# Patient Record
Sex: Female | Born: 1955 | Race: Black or African American | Hispanic: No | Marital: Single | Smoking: Current every day smoker
Health system: Southern US, Community
[De-identification: ages and names within clinical notes are randomized; demographics above are authoritative.]

## PROBLEM LIST (undated history)

## (undated) DIAGNOSIS — I5042 Chronic combined systolic (congestive) and diastolic (congestive) heart failure: Secondary | ICD-10-CM

## (undated) DIAGNOSIS — I639 Cerebral infarction, unspecified: Secondary | ICD-10-CM

## (undated) DIAGNOSIS — I10 Essential (primary) hypertension: Secondary | ICD-10-CM

## (undated) DIAGNOSIS — K219 Gastro-esophageal reflux disease without esophagitis: Secondary | ICD-10-CM

## (undated) DIAGNOSIS — E119 Type 2 diabetes mellitus without complications: Secondary | ICD-10-CM

## (undated) DIAGNOSIS — J449 Chronic obstructive pulmonary disease, unspecified: Secondary | ICD-10-CM

## (undated) DIAGNOSIS — I2510 Atherosclerotic heart disease of native coronary artery without angina pectoris: Secondary | ICD-10-CM

## (undated) DIAGNOSIS — I739 Peripheral vascular disease, unspecified: Secondary | ICD-10-CM

## (undated) HISTORY — PX: EYE SURGERY: SHX253

## (undated) HISTORY — PX: ABDOMINAL HYSTERECTOMY: SHX81

## (undated) HISTORY — PX: COLONOSCOPY: SHX174

## (undated) HISTORY — PX: CARDIAC CATHETERIZATION: SHX172

---

## 2004-03-22 ENCOUNTER — Emergency Department: Payer: Self-pay | Admitting: Emergency Medicine

## 2004-08-30 ENCOUNTER — Emergency Department: Payer: Self-pay | Admitting: Emergency Medicine

## 2006-10-15 ENCOUNTER — Emergency Department: Payer: Self-pay | Admitting: Emergency Medicine

## 2008-01-10 ENCOUNTER — Emergency Department: Payer: Self-pay | Admitting: Emergency Medicine

## 2009-04-11 ENCOUNTER — Ambulatory Visit: Payer: Self-pay | Admitting: Family Medicine

## 2009-06-13 ENCOUNTER — Inpatient Hospital Stay: Payer: Self-pay | Admitting: Internal Medicine

## 2009-10-23 ENCOUNTER — Encounter: Payer: Self-pay | Admitting: *Deleted

## 2009-11-02 ENCOUNTER — Encounter: Payer: Self-pay | Admitting: *Deleted

## 2009-12-03 ENCOUNTER — Encounter: Payer: Self-pay | Admitting: *Deleted

## 2010-01-03 ENCOUNTER — Encounter: Payer: Self-pay | Admitting: *Deleted

## 2010-02-02 ENCOUNTER — Encounter: Payer: Self-pay | Admitting: *Deleted

## 2010-03-05 ENCOUNTER — Encounter: Payer: Self-pay | Admitting: *Deleted

## 2010-10-22 ENCOUNTER — Emergency Department: Payer: Self-pay | Admitting: Unknown Physician Specialty

## 2011-01-25 ENCOUNTER — Emergency Department: Payer: Self-pay | Admitting: Internal Medicine

## 2011-01-29 ENCOUNTER — Ambulatory Visit: Payer: Self-pay | Admitting: General Practice

## 2011-01-31 ENCOUNTER — Emergency Department: Payer: Self-pay | Admitting: *Deleted

## 2011-04-01 ENCOUNTER — Ambulatory Visit: Payer: Self-pay | Admitting: Unknown Physician Specialty

## 2011-04-01 DIAGNOSIS — I10 Essential (primary) hypertension: Secondary | ICD-10-CM

## 2011-04-08 ENCOUNTER — Ambulatory Visit: Payer: Self-pay | Admitting: Unknown Physician Specialty

## 2011-04-09 LAB — PATHOLOGY REPORT

## 2011-04-12 ENCOUNTER — Emergency Department: Payer: Self-pay | Admitting: Emergency Medicine

## 2011-07-25 ENCOUNTER — Emergency Department: Payer: Self-pay | Admitting: Emergency Medicine

## 2011-07-25 LAB — CBC
HCT: 42.7 % (ref 35.0–47.0)
HGB: 14.2 g/dL (ref 12.0–16.0)
MCV: 84 fL (ref 80–100)
RBC: 5.12 10*6/uL (ref 3.80–5.20)
RDW: 15.1 % — ABNORMAL HIGH (ref 11.5–14.5)

## 2011-07-25 LAB — COMPREHENSIVE METABOLIC PANEL
Albumin: 3.3 g/dL — ABNORMAL LOW (ref 3.4–5.0)
Alkaline Phosphatase: 58 U/L (ref 50–136)
Anion Gap: 7 (ref 7–16)
Bilirubin,Total: 0.3 mg/dL (ref 0.2–1.0)
Co2: 32 mmol/L (ref 21–32)
Creatinine: 1.05 mg/dL (ref 0.60–1.30)
Osmolality: 288 (ref 275–301)

## 2011-08-23 ENCOUNTER — Inpatient Hospital Stay: Payer: Self-pay | Admitting: Internal Medicine

## 2011-08-23 LAB — CBC WITH DIFFERENTIAL/PLATELET
Basophil #: 0 10*3/uL (ref 0.0–0.1)
Basophil %: 0.2 %
Eosinophil #: 0 10*3/uL (ref 0.0–0.7)
Eosinophil %: 0.1 %
HCT: 45.1 % (ref 35.0–47.0)
HGB: 14.7 g/dL (ref 12.0–16.0)
Lymphocyte %: 23.8 %
MCHC: 32.6 g/dL (ref 32.0–36.0)
Monocyte %: 6.5 %
Neutrophil #: 6.4 10*3/uL (ref 1.4–6.5)
RDW: 15.6 % — ABNORMAL HIGH (ref 11.5–14.5)
WBC: 9.2 10*3/uL (ref 3.6–11.0)

## 2011-08-23 LAB — COMPREHENSIVE METABOLIC PANEL
Albumin: 2.9 g/dL — ABNORMAL LOW (ref 3.4–5.0)
Alkaline Phosphatase: 77 U/L (ref 50–136)
BUN: 19 mg/dL — ABNORMAL HIGH (ref 7–18)
Bilirubin,Total: 0.3 mg/dL (ref 0.2–1.0)
Calcium, Total: 8.5 mg/dL (ref 8.5–10.1)
Chloride: 102 mmol/L (ref 98–107)
Co2: 32 mmol/L (ref 21–32)
EGFR (African American): >60
EGFR (Non-African Amer.): >60
Glucose: 317 mg/dL — ABNORMAL HIGH (ref 65–99)
Osmolality: 298 (ref 275–301)
Potassium: 3.4 mmol/L — ABNORMAL LOW (ref 3.5–5.1)
SGOT(AST): 39 U/L — ABNORMAL HIGH (ref 15–37)
SGPT (ALT): 26 U/L
Sodium: 142 mmol/L (ref 136–145)
Total Protein: 7.1 g/dL (ref 6.4–8.2)

## 2011-08-23 LAB — CK: CK, Total: 206 U/L (ref 21–215)

## 2011-08-24 LAB — CBC WITH DIFFERENTIAL/PLATELET
Basophil #: 0 10*3/uL (ref 0.0–0.1)
Eosinophil %: 0.1 %
HGB: 13.8 g/dL (ref 12.0–16.0)
Lymphocyte #: 2.4 10*3/uL (ref 1.0–3.6)
MCH: 26.7 pg (ref 26.0–34.0)
MCHC: 31.9 g/dL — ABNORMAL LOW (ref 32.0–36.0)
Monocyte #: 0.8 x10 3/mm (ref 0.2–0.9)
Monocyte %: 5.6 %
Neutrophil #: 10.8 10*3/uL — ABNORMAL HIGH (ref 1.4–6.5)
Neutrophil %: 77.2 %
Platelet: 140 10*3/uL — ABNORMAL LOW (ref 150–440)
RBC: 5.18 10*6/uL (ref 3.80–5.20)
WBC: 14 10*3/uL — ABNORMAL HIGH (ref 3.6–11.0)

## 2011-08-24 LAB — COMPREHENSIVE METABOLIC PANEL
Alkaline Phosphatase: 68 U/L (ref 50–136)
Anion Gap: 7 (ref 7–16)
BUN: 27 mg/dL — ABNORMAL HIGH (ref 7–18)
Bilirubin,Total: 0.3 mg/dL (ref 0.2–1.0)
Calcium, Total: 8.6 mg/dL (ref 8.5–10.1)
Creatinine: 0.96 mg/dL (ref 0.60–1.30)
EGFR (African American): >60
EGFR (Non-African Amer.): >60
Glucose: 164 mg/dL — ABNORMAL HIGH (ref 65–99)
Osmolality: 286 (ref 275–301)
Potassium: 3.5 mmol/L (ref 3.5–5.1)
SGOT(AST): 20 U/L (ref 15–37)
SGPT (ALT): 22 U/L
Sodium: 139 mmol/L (ref 136–145)

## 2011-08-24 LAB — MAGNESIUM: Magnesium: 2 mg/dL

## 2011-08-25 LAB — CBC WITH DIFFERENTIAL/PLATELET
Eosinophil #: 0 10*3/uL (ref 0.0–0.7)
Eosinophil %: 0 %
HCT: 42.9 % (ref 35.0–47.0)
HGB: 13.8 g/dL (ref 12.0–16.0)
Lymphocyte #: 2.7 10*3/uL (ref 1.0–3.6)
Lymphocyte %: 32.8 %
Monocyte #: 0.5 x10 3/mm (ref 0.2–0.9)
Monocyte %: 6 %
Neutrophil #: 4.9 10*3/uL (ref 1.4–6.5)
Neutrophil %: 60.5 %
RBC: 5.08 10*6/uL (ref 3.80–5.20)

## 2011-08-25 LAB — CREATININE, SERUM: EGFR (Non-African Amer.): >60

## 2011-08-25 LAB — PROTIME-INR: INR: 0.8

## 2011-08-25 LAB — POTASSIUM: Potassium: 4.1 mmol/L (ref 3.5–5.1)

## 2011-08-25 LAB — HEMOGLOBIN A1C: Hemoglobin A1C: 10.7 % — ABNORMAL HIGH (ref 4.2–6.3)

## 2011-09-30 ENCOUNTER — Encounter: Payer: Self-pay | Admitting: *Deleted

## 2011-10-04 ENCOUNTER — Encounter: Payer: Self-pay | Admitting: *Deleted

## 2011-10-26 ENCOUNTER — Emergency Department: Payer: Self-pay | Admitting: Emergency Medicine

## 2011-11-03 ENCOUNTER — Encounter: Payer: Self-pay | Admitting: *Deleted

## 2011-12-03 ENCOUNTER — Inpatient Hospital Stay: Payer: Self-pay | Admitting: Internal Medicine

## 2011-12-03 LAB — PRO B NATRIURETIC PEPTIDE: B-Type Natriuretic Peptide: 3900 pg/mL — ABNORMAL HIGH (ref 0–125)

## 2011-12-03 LAB — BASIC METABOLIC PANEL
Anion Gap: 9 (ref 7–16)
BUN: 16 mg/dL (ref 7–18)
Calcium, Total: 8.7 mg/dL (ref 8.5–10.1)
Chloride: 101 mmol/L (ref 98–107)
Co2: 32 mmol/L (ref 21–32)
EGFR (African American): >60
Glucose: 84 mg/dL (ref 65–99)
Osmolality: 284 (ref 275–301)
Potassium: 3 mmol/L — ABNORMAL LOW (ref 3.5–5.1)

## 2011-12-03 LAB — CBC
HGB: 14.2 g/dL (ref 12.0–16.0)
RDW: 15 % — ABNORMAL HIGH (ref 11.5–14.5)
WBC: 11 10*3/uL (ref 3.6–11.0)

## 2011-12-03 LAB — CK TOTAL AND CKMB (NOT AT ARMC): CK-MB: 3.8 ng/mL — ABNORMAL HIGH (ref 0.5–3.6)

## 2011-12-04 LAB — CBC WITH DIFFERENTIAL/PLATELET
Basophil #: 0 10*3/uL (ref 0.0–0.1)
Basophil %: 0.3 %
Eosinophil #: 0 10*3/uL (ref 0.0–0.7)
Eosinophil %: 0 %
HCT: 40.6 % (ref 35.0–47.0)
HGB: 13.9 g/dL (ref 12.0–16.0)
Lymphocyte #: 0.8 10*3/uL — ABNORMAL LOW (ref 1.0–3.6)
Lymphocyte %: 10.8 %
MCH: 28.2 pg (ref 26.0–34.0)
MCHC: 34.1 g/dL (ref 32.0–36.0)
MCV: 83 fL (ref 80–100)
Monocyte #: 0.3 x10 3/mm (ref 0.2–0.9)
Neutrophil #: 6.5 10*3/uL (ref 1.4–6.5)
RDW: 15 % — ABNORMAL HIGH (ref 11.5–14.5)

## 2011-12-04 LAB — CK TOTAL AND CKMB (NOT AT ARMC)
CK, Total: 138 U/L (ref 21–215)
CK-MB: 2.8 ng/mL (ref 0.5–3.6)
CK-MB: 2.9 ng/mL (ref 0.5–3.6)

## 2011-12-04 LAB — COMPREHENSIVE METABOLIC PANEL
Alkaline Phosphatase: 68 U/L (ref 50–136)
Anion Gap: 6 — ABNORMAL LOW (ref 7–16)
Bilirubin,Total: 0.3 mg/dL (ref 0.2–1.0)
Calcium, Total: 8.1 mg/dL — ABNORMAL LOW (ref 8.5–10.1)
Chloride: 103 mmol/L (ref 98–107)
Co2: 33 mmol/L — ABNORMAL HIGH (ref 21–32)
Creatinine: 1.14 mg/dL (ref 0.60–1.30)
EGFR (African American): >60
EGFR (Non-African Amer.): 54 — ABNORMAL LOW
SGOT(AST): 21 U/L (ref 15–37)
SGPT (ALT): 13 U/L
Sodium: 142 mmol/L (ref 136–145)

## 2011-12-04 LAB — TROPONIN I: Troponin-I: 0.07 ng/mL — ABNORMAL HIGH

## 2011-12-05 ENCOUNTER — Encounter: Payer: Self-pay | Admitting: *Deleted

## 2011-12-13 ENCOUNTER — Emergency Department: Payer: Self-pay | Admitting: Emergency Medicine

## 2011-12-13 LAB — COMPREHENSIVE METABOLIC PANEL
Anion Gap: 4 — ABNORMAL LOW (ref 7–16)
BUN: 12 mg/dL (ref 7–18)
Bilirubin,Total: 0.2 mg/dL (ref 0.2–1.0)
Calcium, Total: 8.7 mg/dL (ref 8.5–10.1)
Chloride: 107 mmol/L (ref 98–107)
Creatinine: 1.05 mg/dL (ref 0.60–1.30)
EGFR (African American): >60
Glucose: 40 mg/dL — CL (ref 65–99)
Osmolality: 280 (ref 275–301)
Potassium: 4 mmol/L (ref 3.5–5.1)
SGOT(AST): 29 U/L (ref 15–37)
Sodium: 142 mmol/L (ref 136–145)
Total Protein: 7.8 g/dL (ref 6.4–8.2)

## 2011-12-13 LAB — CBC
HGB: 14.1 g/dL (ref 12.0–16.0)
MCH: 27.8 pg (ref 26.0–34.0)
MCHC: 33.3 g/dL (ref 32.0–36.0)
MCV: 84 fL (ref 80–100)
Platelet: 194 10*3/uL (ref 150–440)
RBC: 5.06 10*6/uL (ref 3.80–5.20)
WBC: 8.1 10*3/uL (ref 3.6–11.0)

## 2012-01-31 ENCOUNTER — Inpatient Hospital Stay: Payer: Self-pay | Admitting: Student

## 2012-01-31 LAB — PRO B NATRIURETIC PEPTIDE: B-Type Natriuretic Peptide: 6251 pg/mL — ABNORMAL HIGH (ref 0–125)

## 2012-01-31 LAB — BASIC METABOLIC PANEL
Anion Gap: 12 (ref 7–16)
BUN: 13 mg/dL (ref 7–18)
Calcium, Total: 8.3 mg/dL — ABNORMAL LOW (ref 8.5–10.1)
Chloride: 103 mmol/L (ref 98–107)
EGFR (Non-African Amer.): 51 — ABNORMAL LOW
Osmolality: 282 (ref 275–301)
Potassium: 3.2 mmol/L — ABNORMAL LOW (ref 3.5–5.1)

## 2012-01-31 LAB — CBC
HCT: 43.2 % (ref 35.0–47.0)
MCH: 26.8 pg (ref 26.0–34.0)
Platelet: 175 10*3/uL (ref 150–440)
RBC: 5.28 10*6/uL — ABNORMAL HIGH (ref 3.80–5.20)
WBC: 9.2 10*3/uL (ref 3.6–11.0)

## 2012-01-31 LAB — TROPONIN I
Troponin-I: 0.05 ng/mL
Troponin-I: 0.08 ng/mL — ABNORMAL HIGH

## 2012-01-31 LAB — APTT: Activated PTT: 34 secs (ref 23.6–35.9)

## 2012-02-01 LAB — BASIC METABOLIC PANEL
Anion Gap: 6 — ABNORMAL LOW (ref 7–16)
Calcium, Total: 8.7 mg/dL (ref 8.5–10.1)
Chloride: 103 mmol/L (ref 98–107)
EGFR (African American): 54 — ABNORMAL LOW
EGFR (Non-African Amer.): 47 — ABNORMAL LOW
Glucose: 55 mg/dL — ABNORMAL LOW (ref 65–99)
Osmolality: 284 (ref 275–301)
Sodium: 143 mmol/L (ref 136–145)

## 2012-02-01 LAB — TROPONIN I: Troponin-I: 0.07 ng/mL — ABNORMAL HIGH

## 2012-02-01 LAB — APTT
Activated PTT: 57.6 secs — ABNORMAL HIGH (ref 23.6–35.9)
Activated PTT: 60.7 secs — ABNORMAL HIGH (ref 23.6–35.9)

## 2012-02-02 LAB — BASIC METABOLIC PANEL
BUN: 26 mg/dL — ABNORMAL HIGH (ref 7–18)
Calcium, Total: 8.6 mg/dL (ref 8.5–10.1)
Co2: 28 mmol/L (ref 21–32)
EGFR (African American): 55 — ABNORMAL LOW
EGFR (Non-African Amer.): 47 — ABNORMAL LOW
Glucose: 229 mg/dL — ABNORMAL HIGH (ref 65–99)
Osmolality: 291 (ref 275–301)
Sodium: 140 mmol/L (ref 136–145)

## 2012-05-15 ENCOUNTER — Emergency Department: Payer: Self-pay | Admitting: Emergency Medicine

## 2012-09-05 ENCOUNTER — Emergency Department: Payer: Self-pay | Admitting: Emergency Medicine

## 2012-09-05 LAB — BASIC METABOLIC PANEL
Anion Gap: 5 — ABNORMAL LOW (ref 7–16)
BUN: 18 mg/dL (ref 7–18)
Calcium, Total: 8.5 mg/dL (ref 8.5–10.1)
Chloride: 97 mmol/L — ABNORMAL LOW (ref 98–107)
Co2: 30 mmol/L (ref 21–32)
EGFR (African American): 53 — ABNORMAL LOW
Glucose: 362 mg/dL — ABNORMAL HIGH (ref 65–99)
Potassium: 3.6 mmol/L (ref 3.5–5.1)

## 2012-09-05 LAB — CBC
HGB: 14.5 g/dL (ref 12.0–16.0)
MCHC: 33.7 g/dL (ref 32.0–36.0)
MCV: 84 fL (ref 80–100)
Platelet: 126 10*3/uL — ABNORMAL LOW (ref 150–440)
RBC: 5.14 10*6/uL (ref 3.80–5.20)
RDW: 15 % — ABNORMAL HIGH (ref 11.5–14.5)

## 2012-09-05 LAB — CK TOTAL AND CKMB (NOT AT ARMC): CK, Total: 157 U/L (ref 21–215)

## 2012-09-05 LAB — TROPONIN I: Troponin-I: 0.03 ng/mL

## 2012-09-06 LAB — LIPASE, BLOOD: Lipase: 202 U/L (ref 73–393)

## 2012-09-06 LAB — PRO B NATRIURETIC PEPTIDE: B-Type Natriuretic Peptide: 409 pg/mL — ABNORMAL HIGH (ref 0–125)

## 2012-09-10 ENCOUNTER — Emergency Department: Payer: Self-pay | Admitting: Emergency Medicine

## 2012-09-10 LAB — CBC
HCT: 43.3 % (ref 35.0–47.0)
Platelet: 154 10*3/uL (ref 150–440)
RBC: 5.15 10*6/uL (ref 3.80–5.20)
RDW: 14.9 % — ABNORMAL HIGH (ref 11.5–14.5)
WBC: 10.1 10*3/uL (ref 3.6–11.0)

## 2012-09-10 LAB — COMPREHENSIVE METABOLIC PANEL
Albumin: 3.2 g/dL — ABNORMAL LOW (ref 3.4–5.0)
Anion Gap: 5 — ABNORMAL LOW (ref 7–16)
BUN: 31 mg/dL — ABNORMAL HIGH (ref 7–18)
Calcium, Total: 8.9 mg/dL (ref 8.5–10.1)
Co2: 29 mmol/L (ref 21–32)
EGFR (Non-African Amer.): 43 — ABNORMAL LOW
Glucose: 240 mg/dL — ABNORMAL HIGH (ref 65–99)
Osmolality: 281 (ref 275–301)
Potassium: 4.8 mmol/L (ref 3.5–5.1)
SGOT(AST): 35 U/L (ref 15–37)
Sodium: 133 mmol/L — ABNORMAL LOW (ref 136–145)
Total Protein: 8.2 g/dL (ref 6.4–8.2)

## 2012-09-10 LAB — CK TOTAL AND CKMB (NOT AT ARMC): CK, Total: 223 U/L — ABNORMAL HIGH (ref 21–215)

## 2012-09-10 LAB — TROPONIN I: Troponin-I: <0.02 ng/mL

## 2013-01-23 ENCOUNTER — Emergency Department: Payer: Self-pay | Admitting: Emergency Medicine

## 2013-01-23 LAB — CBC
HGB: 14.3 g/dL (ref 12.0–16.0)
MCH: 27.7 pg (ref 26.0–34.0)
MCV: 82 fL (ref 80–100)
Platelet: 97 10*3/uL — ABNORMAL LOW (ref 150–440)
RBC: 5.18 10*6/uL (ref 3.80–5.20)

## 2013-01-24 LAB — COMPREHENSIVE METABOLIC PANEL
Albumin: 3.1 g/dL — ABNORMAL LOW (ref 3.4–5.0)
Anion Gap: 6 — ABNORMAL LOW (ref 7–16)
Bilirubin,Total: 0.2 mg/dL (ref 0.2–1.0)
Chloride: 100 mmol/L (ref 98–107)
Co2: 29 mmol/L (ref 21–32)
Creatinine: 1.26 mg/dL (ref 0.60–1.30)
EGFR (African American): 55 — ABNORMAL LOW
EGFR (Non-African Amer.): 48 — ABNORMAL LOW
Potassium: 4.4 mmol/L (ref 3.5–5.1)
SGOT(AST): 18 U/L (ref 15–37)
Total Protein: 7.2 g/dL (ref 6.4–8.2)

## 2013-01-24 LAB — URINALYSIS, COMPLETE
Bacteria: NONE SEEN
Glucose,UR: 100 mg/dL (ref 0–75)
Ketone: NEGATIVE
Leukocyte Esterase: NEGATIVE
Nitrite: NEGATIVE
Ph: 6 (ref 4.5–8.0)
RBC,UR: 2 /HPF (ref 0–5)
WBC UR: 1 /HPF (ref 0–5)

## 2013-03-10 ENCOUNTER — Emergency Department: Payer: Self-pay | Admitting: Emergency Medicine

## 2013-03-10 LAB — COMPREHENSIVE METABOLIC PANEL
Alkaline Phosphatase: 61 U/L (ref 50–136)
Co2: 32 mmol/L (ref 21–32)
Creatinine: 1.66 mg/dL — ABNORMAL HIGH (ref 0.60–1.30)
EGFR (Non-African Amer.): 34 — ABNORMAL LOW
Potassium: 4.1 mmol/L (ref 3.5–5.1)
SGOT(AST): 24 U/L (ref 15–37)
SGPT (ALT): 15 U/L (ref 12–78)
Total Protein: 7.4 g/dL (ref 6.4–8.2)

## 2013-03-10 LAB — CBC
HGB: 12.8 g/dL (ref 12.0–16.0)
MCH: 27.9 pg (ref 26.0–34.0)
MCHC: 34.7 g/dL (ref 32.0–36.0)
MCV: 81 fL (ref 80–100)
RBC: 4.6 10*6/uL (ref 3.80–5.20)

## 2013-03-10 LAB — URINALYSIS, COMPLETE
Bilirubin,UR: NEGATIVE
Glucose,UR: NEGATIVE mg/dL (ref 0–75)
Ph: 5 (ref 4.5–8.0)
RBC,UR: <1 /HPF (ref 0–5)
Specific Gravity: 1.005 (ref 1.003–1.030)
Squamous Epithelial: NONE SEEN

## 2013-10-24 ENCOUNTER — Emergency Department: Payer: Self-pay | Admitting: Emergency Medicine

## 2013-10-24 LAB — BASIC METABOLIC PANEL
ANION GAP: 5 — AB (ref 7–16)
BUN: 31 mg/dL — AB (ref 7–18)
CHLORIDE: 100 mmol/L (ref 98–107)
Calcium, Total: 8.5 mg/dL (ref 8.5–10.1)
Co2: 30 mmol/L (ref 21–32)
Creatinine: 1.39 mg/dL — ABNORMAL HIGH (ref 0.60–1.30)
GFR CALC AF AMER: 49 — AB
GFR CALC NON AF AMER: 42 — AB
Glucose: 322 mg/dL — ABNORMAL HIGH (ref 65–99)
Osmolality: 289 (ref 275–301)
Potassium: 4 mmol/L (ref 3.5–5.1)
Sodium: 135 mmol/L — ABNORMAL LOW (ref 136–145)

## 2013-10-24 LAB — CBC
HCT: 44.2 % (ref 35.0–47.0)
HGB: 14.4 g/dL (ref 12.0–16.0)
MCH: 27.4 pg (ref 26.0–34.0)
MCHC: 32.6 g/dL (ref 32.0–36.0)
MCV: 84 fL (ref 80–100)
Platelet: 154 10*3/uL (ref 150–440)
RBC: 5.26 10*6/uL — ABNORMAL HIGH (ref 3.80–5.20)
RDW: 15.4 % — ABNORMAL HIGH (ref 11.5–14.5)
WBC: 7.5 10*3/uL (ref 3.6–11.0)

## 2013-10-24 LAB — TROPONIN I: TROPONIN-I: 0.04 ng/mL

## 2013-10-24 LAB — PRO B NATRIURETIC PEPTIDE: B-Type Natriuretic Peptide: 374 pg/mL — ABNORMAL HIGH (ref 0–125)

## 2013-10-25 LAB — TROPONIN I: TROPONIN-I: 0.04 ng/mL

## 2013-12-10 ENCOUNTER — Emergency Department: Payer: Self-pay | Admitting: Emergency Medicine

## 2013-12-11 LAB — CBC
HCT: 43.7 % (ref 35.0–47.0)
HGB: 14.7 g/dL (ref 12.0–16.0)
MCH: 28.4 pg (ref 26.0–34.0)
MCHC: 33.6 g/dL (ref 32.0–36.0)
MCV: 85 fL (ref 80–100)
PLATELETS: 174 10*3/uL (ref 150–440)
RBC: 5.17 10*6/uL (ref 3.80–5.20)
RDW: 16.1 % — ABNORMAL HIGH (ref 11.5–14.5)
WBC: 10.1 10*3/uL (ref 3.6–11.0)

## 2013-12-11 LAB — COMPREHENSIVE METABOLIC PANEL
Albumin: 3.2 g/dL — ABNORMAL LOW (ref 3.4–5.0)
Alkaline Phosphatase: 76 U/L
Anion Gap: 7 (ref 7–16)
BILIRUBIN TOTAL: 0.2 mg/dL (ref 0.2–1.0)
BUN: 33 mg/dL — ABNORMAL HIGH (ref 7–18)
CREATININE: 1.64 mg/dL — AB (ref 0.60–1.30)
Calcium, Total: 8.5 mg/dL (ref 8.5–10.1)
Chloride: 100 mmol/L (ref 98–107)
Co2: 29 mmol/L (ref 21–32)
EGFR (African American): 40 — ABNORMAL LOW
EGFR (Non-African Amer.): 34 — ABNORMAL LOW
GLUCOSE: 221 mg/dL — AB (ref 65–99)
Osmolality: 286 (ref 275–301)
Potassium: 5 mmol/L (ref 3.5–5.1)
SGOT(AST): 35 U/L (ref 15–37)
SGPT (ALT): 53 U/L
SODIUM: 136 mmol/L (ref 136–145)
Total Protein: 8.2 g/dL (ref 6.4–8.2)

## 2013-12-11 LAB — PRO B NATRIURETIC PEPTIDE: B-Type Natriuretic Peptide: 110 pg/mL (ref 0–125)

## 2013-12-11 LAB — TROPONIN I: Troponin-I: <0.02 ng/mL

## 2013-12-11 LAB — D-DIMER(ARMC): D-DIMER: 583 ng/mL

## 2013-12-11 LAB — CK TOTAL AND CKMB (NOT AT ARMC)
CK, Total: 190 U/L
CK-MB: 2.7 ng/mL (ref 0.5–3.6)

## 2014-04-10 ENCOUNTER — Ambulatory Visit: Payer: Self-pay | Admitting: Ophthalmology

## 2014-04-10 LAB — CBC WITH DIFFERENTIAL/PLATELET
BASOS ABS: 0 10*3/uL (ref 0.0–0.1)
Basophil %: 0.4 %
EOS ABS: 0 10*3/uL (ref 0.0–0.7)
Eosinophil %: 0.1 %
HCT: 40.6 % (ref 35.0–47.0)
HGB: 13.2 g/dL (ref 12.0–16.0)
Lymphocyte #: 1.4 10*3/uL (ref 1.0–3.6)
Lymphocyte %: 18.3 %
MCH: 28.5 pg (ref 26.0–34.0)
MCHC: 32.5 g/dL (ref 32.0–36.0)
MCV: 88 fL (ref 80–100)
MONOS PCT: 6.1 %
Monocyte #: 0.5 x10 3/mm (ref 0.2–0.9)
Neutrophil #: 5.9 10*3/uL (ref 1.4–6.5)
Neutrophil %: 75.1 %
Platelet: 167 10*3/uL (ref 150–440)
RBC: 4.62 10*6/uL (ref 3.80–5.20)
RDW: 15.3 % — ABNORMAL HIGH (ref 11.5–14.5)
WBC: 7.9 10*3/uL (ref 3.6–11.0)

## 2014-04-10 LAB — POTASSIUM: Potassium: 3.8 mmol/L (ref 3.5–5.1)

## 2014-04-18 ENCOUNTER — Ambulatory Visit: Payer: Self-pay | Admitting: Ophthalmology

## 2014-08-22 NOTE — Discharge Summary (Signed)
PATIENT NAME:  Alexis Ray, Alexis Ray MR#:  [id number] DATE OF BIRTH:  25-May-1955  DATE OF ADMISSION:  01/31/2012 DATE OF DISCHARGE:  02/03/2012  CONSULTANTS: Dr. Gwen Pounds   PRIMARY CARE PHYSICIAN: Dr.  at [facility]   CHIEF COMPLAINT: Chest pain.   DISCHARGE DIAGNOSES:  1. Pleuritic chest pain, likely from chronic obstructive pulmonary disease exacerbation.  2. Acute on chronic systolic congestive heart failure.  3. Elevated troponin, likely demand ischemia.  4. Severe peripheral vascular disease.  5. History of congestive heart failure with last ejection fraction of 20%, with severe mitral regurgitation and tricuspid regurgitation and four-chamber enlargement.  6. Coronary artery disease. 7. Diabetes.  8. Hypertension.  9. Dyslipidemia.  10. History of cerebrovascular accident.  11. History of osteoarthritis.  12. History of gastroesophageal reflux disease.   DISCHARGE MEDICATIONS:  1. Spiriva 18 mcg inhaled, one cap daily. 2. Venlafaxine  225 mg extended-release once a day.  3. Aspirin 81 mg daily.  4. Lasix 80 mg 2 times a day.  5. Metformin 1000 mg 2 times a day.  6. Potassium chloride 20 mEq, 1 tab daily.  7. Diovan 40 mg daily.  8. Coreg 12.5 mg 2 times a day.  9. Crestor 40 mg daily.  10. Nexium 40 mg daily.  11. Spironolactone 25 mg daily.  12. Symbicort 160/4.5 mcg, 2 puffs 2 times a day.  13. Albuterol CFC free, 90 mcg 2 puffs every four hours as needed for shortness of breath.  14. Lyrica 75 mg 2 caps once a day in the morning, 1 cap once a day in the afternoon, 1 cap once a day at bedtime.  15. NovoLog 6 to 15 units subcutaneous 2 times a day.  16. Flonase 50 mcg, one spray in each nostril once a day.  17. Lantus 55 units through Solostar pen at bedtime. 18. Nitroglycerin p.r.n. 0.4 mg sublingual every five minutes as needed up to 3 tabs for chest pain. Levofloxacin 250 mg once a day for three days.  19. Prednisone taper, 50 mg for one day, 40 mg for one day, 30 mg for one  day, 20 mg for one day, 10 mg p.o. for one day then stop.   DIET: Low sodium, consistent carb diabetic diet.   ACTIVITY: As tolerated.   FOLLOWUP: Please follow with your primary care physician within 1 to 2 weeks.   DISPOSITION: Home.   HISTORY OF PRESENT ILLNESS: For full details of history and physical, please see the dictation on 28th of September by Dr. Imogene Burn. Briefly this is a 59 year old female with history of coronary artery disease, congestive heart failure, peripheral vascular disease, and chronic obstructive pulmonary disease who came in with complaints of chest pain. She was admitted to the hospitalist service for further evaluation and management, and given symptoms started on heparin drip initially given she had multiple risk factors.   SIGNIFICANT LABS AND IMAGING: Initial BNP is 6251, creatinine 1.19, potassium 3.2. Initial troponin 0.05, then 0.08, then 0.07. WBC 9.2, hemoglobin 14.1 on arrival. Echocardiogram showing ejection fraction of 20%, moderate four-chamber enlargement. There is severe mitral regurgitation, severe tricuspid regurgitation, and severe global hypokinesis. Chest x-ray PA and lateral on arrival showing bilateral diffuse interstitial thickening likely representing interstitial edema versus interstitial pneumonitis.   HOSPITAL COURSE: The patient was admitted to the hospitalist service on telemetry. For the chest pain she was started on a heparin drip and troponins were cycled as initial thought was that this was perhaps unstable angina given the coronary artery  disease, peripheral vascular disease, and history of congestive heart failure. On further questioning, apparently the patient has been having increased use of her outpatient albuterol up to 3 to 4 times every day for several weeks with increased wheezing and shortness of breath and a visit to her PCP per patient. She appeared to also be wheezing and the chest pain on further questioning was pleuritic, as it  increased with cough and was positional. The troponins did bump up slightly, however remained stable and the patient was seen by Dr. Gwen PoundsKowalski from cardiology. Heparin drip was continued for a day and stopped. Her cardiac medications including aspirin, beta blocker, ARB, as well as nitroglycerin and p.r.n. morphine were continued.   The chest pain likely was a combination of chronic obstructive pulmonary disease and congestive heart failure exacerbations. For the chronic obstructive pulmonary disease she was started on  steroids IV, nebulizers, Symbicort, and Levaquin was started. The patient had no evidence of pneumonia on x-ray of the chest and had no fever or leukocytosis. She will be going home with Levaquin and a prednisone taper. Furthermore, she has not been on Spiriva and that will be started for her here. In regards to the acute on chronic systolic congestive heart failure, an echocardiogram was obtained showing the above results and her outpatient regimens were continued. She had significant improvement in her pleuritic chest pain. Elevation of troponin is mild and likely demand ischemia related. At this point she will be discharged with outpatient followup with her primary care physician. She will be going home with her other outpatient regimen.   TOTAL TIME SPENT: 35 minutes.     ____________________________ Alexis EatonShayiq Nyanna Heideman, MD sa:bjt D: 02/03/2012 10:10:10 ET T: 02/03/2012 11:44:01 ET JOB#: [id number]  cc: Alexis EatonShayiq Torian Thoennes, MD, <Dictator> [facility] Alexis EatonSHAYIQ Demarie Hyneman MD ELECTRONICALLY SIGNED 02/06/2012 15:11

## 2014-08-22 NOTE — Consult Note (Signed)
PATIENT NAME:  Alexis Ray, Alexis Ray MR#:  [id number] DATE OF BIRTH:  01/22/1956  DATE OF CONSULTATION:  02/01/2012  REFERRING PHYSICIAN:  Dr. Jacques NavyAhmadzia, MD  CONSULTING PHYSICIAN:  Lamar BlinksBruce J. Veto Macqueen, MD  CARDIOLOGIST: Dr. Juliann Paresallwood   REASON FOR CONSULTATION: Acute on chronic congestive heart failure, hypertension, hyperlipidemia, chronic obstructive pulmonary disease, diabetes with elevated troponin and chest pain.   CHIEF COMPLAINT: "I have chest pain".  HISTORY OF PRESENT ILLNESS: This is a 59 year old female with known coronary artery disease status post previous myocardial infarction in the remote past with severe LV systolic dysfunction with ejection fraction between 25 and 30% with valvular heart disease. Additionally, the patient has had hypertension on appropriate medications, diabetes which is relatively stable, and COPD which appears to be exacerbated at this time causing some issues with hypoxia. The patient has had some episodes of chest discomfort throughout her chest and the right side as well as left side especially with deep breathing most consistent with pleuritic type chest discomfort. She does have an EKG showing normal sinus rhythm with poor R wave progression and T wave inversion unchanged from before. Troponin elevation of 0.08 is most consistent with hypoxia and current illness rather than acute coronary syndrome. The patient is significantly hypoxic at this point.   Remainder of review of systems cannot be assessed due to hypoxia and the patient is being somewhat somnolent.   PAST MEDICAL HISTORY:  1. Severe dilated cardiomyopathy.  2. Hypertension.  3. Hyperlipidemia.  4. Diabetes mellitus.  5. Chronic obstructive pulmonary disease.   FAMILY HISTORY: No family members with early onset of cardiovascular disease or hypertension.   SOCIAL HISTORY: Currently denies alcohol or tobacco use.   ALLERGIES: She has no known drug allergies.   CURRENT MEDICATIONS: As listed.    PHYSICAL EXAMINATION:    VITAL SIGNS: Blood pressure 136/68 bilaterally, heart rate 78 upright, reclining, and irregular.   GENERAL: She is a well appearing female in respiratory distress.   HEENT: No icterus, thyromegaly, ulcers, hemorrhage, or xanthelasma.   CARDIOVASCULAR: Slightly irregular with normal S1, distant S2 with a 2 out of 6 apical murmur consistent with mitral regurgitation. PMI is diffuse. Carotid upstroke normal without bruit. Jugular venous pressure is slightly elevated.   LUNGS: Lungs have bibasilar crackles mainly on the left with expiratory wheezes.   ABDOMEN: Soft, nontender without hepatosplenomegaly or masses. Abdominal aorta is normal size without bruit.   EXTREMITIES: 2+ radial, femoral, dorsal pedal pulses with trace to 1+ lower extremity edema. No cyanosis, clubbing, or ulcers.   NEUROLOGIC: She is obtunded.   ASSESSMENT: This is a 59 year old female with severe dilated cardiomyopathy, hypertension, hyperlipidemia, coronary artery disease, valvular heart disease with abnormal EKG and acute on chronic congestive heart failure due to LV systolic dysfunction with hypoxia from COPD and chest pain most consistent with current illness and elevated troponin consistent with demand ischemia without evidence of myocardial infarction or acute coronary syndrome.   RECOMMENDATIONS:  1. Intravenous Lasix for pulmonary edema and hypoxia with acute on chronic congestive heart failure, systolic in nature.  2. Further treatment of COPD exacerbation and hypoxia. 3. Continue serial ECG and enzymes to assess for possible myocardial infarction.  4. Consider echocardiogram for worsening LV dysfunction changes causing above with possible pulmonary hypertension with treatment options thereafter  5. No change in previous outpatient treatment of cardiomyopathy including heart rate control with beta-blocker and ACE inhibitor if able watching for chronic kidney disease.  6. BiPAP for  significant hypoxia and somnolence.  7. Diabetes control with a goal hemoglobin A1c below 7.  8. Further treatment options and diagnostic testing after above.   ____________________________ Lamar Blinks, MD bjk:drc D: 02/01/2012 09:42:02 ET T: 02/01/2012 13:42:24 ET JOB#: [id number]  cc: Lamar Blinks, MD, <Dictator> Lamar Blinks MD ELECTRONICALLY SIGNED 02/04/2012 8:46

## 2014-08-22 NOTE — Discharge Summary (Signed)
PATIENT NAME:  Alexis Ray, Alexis Ray MR#:  [id number] DATE OF BIRTH:  10/05/1955  DATE OF ADMISSION:  12/03/2011 DATE OF DISCHARGE:  12/04/2011   PRIMARY CARE PHYSICIAN: Hamilton CapriSezmin Noorani, MD   DISCHARGE DIAGNOSES:  1. Acute on chronic systolic congestive heart failure.  2. Tobacco abuse.  3. Chronic obstructive pulmonary disease.  4. Elevated troponin.  5. Diabetes mellitus.  6. Hypertension.  7. Noncompliance with dietary recommendations.  8. Hypokalemia.   IMAGING STUDIES: Chest x-ray showed congestive heart failure.   ADMITTING HISTORY AND PHYSICAL: Please see detailed history and physical dictated previously by Dr. Susie CassetteAbrol. In brief, this is a 59 year old female patient with systolic heart failure with ejection fraction 25% on a recent echocardiogram who presented to the Emergency Room complaining of progressive shortness of breath. The patient was noncompliant with her dietary recommendations drinking about 3 to 4 liters of water every day. The patient continues to smoke which caused her to have acute systolic CHF and the patient was admitted to the hospitalist service.   HOSPITAL COURSE: The patient was started on IV Lasix with which she diuresed well and her shortness of breath resolved and was saturating 96% on room air at the time of discharge with pulse 77, blood pressure 152/77, and afebrile. The patient did have some hypokalemia secondary to diuresis which was replaced. For tobacco abuse the patient was counseled for more than four minutes by me regarding quitting smoking. Also, nicotine patch was offered and outpatient services explained. The patient does want to quit smoking.   DISCHARGE MEDICATIONS:  1. Lasix 80 mg oral twice a day. Advised to increase it to 3 times daily for the next two days and return to original schedule.  2. Venlafaxine 225 mg oral once a day.  3. Lantus 55 units subcutaneous once a day.  4. Aspirin 81 mg oral once a day.  5. Flonase 50 mcg one spray nasal once a  day.  6. Metformin 1000 mg oral 2 times a day.  7. Potassium chloride 20 mEq oral once a day.  8. Diovan 40 mg oral once a day.  9. Coreg 12.5 mg oral 2 times a day.  10. Crestor 40 mg oral once a day.  11. Nexium 40 mg oral once a day.  12. Spironolactone 25 mg oral once a day.  13. Symbicort 164.5 2 puffs inhaled 2 times a day.  14. Albuterol 2 puffs inhaled every four hours as needed for shortness of breath or wheezing.  15. Lyrica 75 mg 2 capsules in the morning, one in the afternoon, one at bedtime.  16. Nitroglycerin 0.1 mg/hour transdermal film once a day.  17. Nitroglycerin 0.4 mg sublingual as needed for chest pain.  18. NovoLog sliding scale.   DISCHARGE INSTRUCTIONS:  1. The patient has been instructed to weigh herself every day. Call her doctor if she increases more than 3 pounds in a day.  2. The patient is to be compliant with fluid restriction of less than 2 liters per day.  3. She will be on a diabetic low salt diet with activity as tolerated.   This plan was discussed with the patient and her daughter at bedside who verbalized understanding and are okay with the plan.   Time spent today on this discharge dictation along with coordinating care and counseling of the patient and family: 36 minutes.   ____________________________ Molinda BailiffSrikar R. Latash Nouri, MD srs:drc D: 12/04/2011 13:05:12 ET T: 12/04/2011 13:44:58 ET JOB#: [id number]  cc: Wardell HeathSrikar R. Shakeera Rightmyer, MD, <  Dictator> Hamilton Capri, MD  Orie Fisherman MD ELECTRONICALLY SIGNED 12/16/2011 0:19

## 2014-08-22 NOTE — Consult Note (Signed)
Brief Consult Note: Diagnosis: Known cad htn dm pvd copd with acute onchronic chf and hypoxia with elevated tropoinin.   Patient was seen by consultant.   Consult note dictated.   Comments: continue same oupt meds for cardiomyopathy bipap for hypoxia and chf lasix iv echo for new changes in lv dysfunction.  Electronic Signatures: Lamar BlinksKowalski, Bruce J (MD)  (Signed 29-Sep-13 08:19)  Authored: Brief Consult Note   Last Updated: 29-Sep-13 08:19 by Lamar BlinksKowalski, Bruce J (MD)

## 2014-08-22 NOTE — H&P (Signed)
PATIENT NAME:  Alexis Ray, COBURN MR#:  [id number] DATE OF BIRTH:  1955/09/24  DATE OF ADMISSION:  01/31/2012  PRIMARY CARE PHYSICIAN: Hamilton Capri, MD   REFERRING PHYSICIAN: Dr. Mindi Junker   CHIEF COMPLAINT: Chest pain since this morning.   HISTORY OF PRESENT ILLNESS: The patient is a 59 year old African American female with a history of coronary artery disease, congestive heart failure, PVD, and chronic obstructive pulmonary disease who presented to the ED with chest pain since this morning which is on the left side, constant, 7/10 without radiation. The patient denies any exacerbating risk factor. She denies any palpitation, diaphoresis, orthopnea, or nocturnal dyspnea. No leg edema. She denies any cough, sputum, or shortness of breath. She was treated with aspirin. An EKG showed poor RV progression and T wave inversion and was started on heparin drip. The patient's troponin level was 0.025.   PAST MEDICAL HISTORY:  1. Congestive heart failure with ejection fraction 25%. 2. Severe peripheral vascular disease  3. Chronic obstructive pulmonary disease. 4. Coronary artery disease. 5. Diabetes, type I.  6. Hypertension. 7. Dyslipidemia. 8. CVA. 9. Osteoarthritis.  10. Gastroesophageal reflux disease.   SOCIAL HISTORY: The patient was a smoker but she denies any smoking, alcohol drinking, or illicit drugs.   PAST SURGICAL HISTORY: None.   FAMILY HISTORY: Positive for coronary artery disease.   ALLERGIES: None.   HOME MEDICATIONS:  1. Albuterol CFC free 90 mcg inhalation 2 puffs every four hours p.r.n.  2. Aspirin 81 mg p.o. daily.  3. Coreg 12.5 mg p.o. b.i.d.  4. Crestor 40 mg p.o. at bedtime. 5. Diovan 40 mg p.o. daily.  6. Flonase 50 mcg inhalation one spray each nostril once a day.  7. Lasix 80 mg p.o. b.i.d.  8. Lantus 50 units sub-Q once a day.  9. Lyrica 75 mg capsule 2 caps t.i.d.  10. Metformin 1000 mg p.o. b.i.d.  11. Nexium 40 mg p.o. daily.  12. Nitroglycerin 0.1 mg/h  transdermal film one patch transdermal once daily.  13. Nitroglycerin 0.4 mg sublingual tablet 1 tablet sublingual every five minutes up to 3 tablets p.r.n. for chest pain.  14. NovoLog 6 to 15 units sub-Q b.i.d.  15. Potassium 20 mEq p.o. daily.  16. Spironolactone 25 mg p.o. daily.  17. Symbicort 160 mcg/4.5 mcg inhalation 2 puffs inhaled twice daily.  18. Venlafaxine 225 mg p.o. tablet 1 tablet p.o. daily.   REVIEW OF SYSTEMS: CONSTITUTIONAL: The patient denies any fever, chills. No headache or dizziness. No weakness. EYES: No double vision, blurred vision. ENT: No epistaxis, postnasal drip, slurred speech, or dysphagia. CARDIOVASCULAR: Positive for chest pain. No palpitations, no orthopnea, no nocturnal dyspnea, no leg edema. RESPIRATORY: No cough, sputum, shortness of breath, or hemoptysis. GI: No abdominal pain, nausea, vomiting, or diarrhea. No melena or bloody stools. GU: No dysuria, hematuria, or incontinence. SKIN: No rash or jaundice. NEUROLOGY: No syncope, loss of consciousness, or seizure. ENDOCRINE: No polyuria, polydipsia, heat or cold intolerance. MUSCULOSKELETAL: No joint pain. No edema.   PHYSICAL EXAMINATION:   VITAL SIGNS: Temperature 98.3, blood pressure 155/63, pulse 76, respirations 22, oxygen saturation 92% on room air.   GENERAL: The patient is alert, awake, oriented in no acute distress.   HEENT: Pupils round, equal, reactive to light and accommodation. Moist oral mucosa. Clear oropharynx.   NECK: Supple. No JVD or carotid bruits. No lymphadenopathy. No thyromegaly.    CARDIOVASCULAR: S1, S2 regular rate and rhythm. No murmurs or gallops.   PULMONARY: Bilateral air entry. No  wheezing or rales. No use of accessory muscles to breathe.   CHEST WALL: There is tenderness on the left side of the chest.   ABDOMEN: Soft. No distention or tenderness. No organomegaly. Bowel sounds present.   EXTREMITIES: No edema, clubbing, or cyanosis. No calf tenderness. Strong bilateral  pedal pulses.   SKIN: No rash or jaundice.   NEUROLOGY: Alert and oriented x3. No focal deficit. Power 5/5. Sensation intact.  LABORATORY DATA: Glucose 111, BUN 13, creatinine 1.19, sodium 141, potassium 3.2, chloride 103, bicarb 26. CBC normal. Troponin 0.05.   EKG shows normal sinus rhythm at 73 beats per minute with T wave inversion from V1 to V6.   IMPRESSION:  1. Chest pain, unstable angina, coronary artery disease.  2. Hypokalemia.  3. Hypertension.  4. Diabetes.  5. Congestive heart failure.  6. Chronic obstructive pulmonary disease.  7. Peripheral vascular disease.   PLAN OF TREATMENT:  1. The patient will be admitted to the tele floor.  2. We will continue heparin drip, o2 nasal cannula, aspirin 325 mg p.o. daily.  3. Continue Diovan and Coreg and follow-up troponin and with Dr. Juliann Paresallwood.  4. Input and output. 5. Strict fluid restriction. 6. Continue Lasix and spironolactone.  7. Continue nebulizer for chronic obstructive pulmonary disease.  8. Potassium was given for hypokalemia. Follow-up BMP.  9. GI and DVT prophylaxis.   Discussed the patient's situation and plan of treatment with the patient.   TIME SPENT: About 60 minutes.   ____________________________ Shaune PollackQing Florie Carico, MD qc:drc D: 01/31/2012 16:52:14 ET T: 02/01/2012 07:43:39 ET JOB#: [id number]  cc: Shaune PollackQing Azaan Leask, MD, <Dictator> Hamilton CapriSezmin Noorani, MD  Shaune PollackQING Jaleeya Mcnelly MD ELECTRONICALLY SIGNED 02/01/2012 20:51

## 2014-08-22 NOTE — H&P (Signed)
PATIENT NAME:  Alexis Ray, HARTS MR#:  [id number] DATE OF BIRTH:  [birth year]  DATE OF ADMISSION:  12/03/2011  PRIMARY CARE PHYSICIAN: Hamilton Capri, MD  SUBJECTIVE: This is a 59 year old female with a history of systolic heart failure and chronic obstructive pulmonary disease who continues to smoke with recent admission in April for the same who presents to the ER with shortness of breath. The patient has also had a productive cough associated with yellow phlegm, but no subjective fever, chills, or rigors. She states that the swelling in her bilateral lower extremity is somewhat improved. She denies any recent weight gain, but does complain of significant orthopnea and paroxysmal nocturnal dyspnea. The patient denies any chest pain per se. She also complains of some wheezing, but continues to smoke and has cut down to 1 to 2 cigarettes a day.   PAST MEDICAL HISTORY:  1. Congestive heart failure with an ejection fraction of 25% on recent echo in April 2013. 2. Severe peripheral vascular disease.  3. Chronic obstructive pulmonary disease with ongoing tobacco abuse.  4. Elevated troponin, likely demand ischemia.  5. Diabetes mellitus, insulin-dependent.  6. Hypertension.  7. Dyslipidemia.  8. Hypokalemia.  9. Cerebrovascular accident.  10. Osteoarthritis.  11. Gastroesophageal reflux disease.   PAST SURGICAL HISTORY: None.   HOME MEDICATIONS:  1. Albuterol inhaler 2 puffs every 4 hours p.r.n.  2. Aspirin 81 mg p.o. daily.  3. Coreg 12.5 mg p.o. twice a day. 4. Crestor 40 mg p.o. daily.  5. Diovan 40 mg p.o. twice a day. 6. Flonase 50 mcg inhaled once a day. 7. Lasix 80 mg p.o. twice a day. 8. Lantus 55 units once a day. 9. Lyrica 75 mg two tablets p.o. daily.  10. Metformin 1000 mg p.o. twice a day. 11. Nexium 40 mg p.o. daily.  12. Nitroglycerin one patch transdermal once a day. 13. Nitroglycerin sublingual as needed.  14. NovoLog subcutaneous insulin twice a day, total of 30 units a  day.  15. K-Dur 20 milliequivalents p.o. twice a day. 16. Aldactone 25 mg p.o. daily.  17. Senokot 160 mcg/4.5 two puffs inhaled twice a day. 18. Venlafaxine 225 mg p.o. twice a day.   ALLERGIES: No known drug allergies.   FAMILY HISTORY: Positive for coronary artery disease.   SOCIAL HISTORY: The patient continues to smoke 1/2 pack a day, occasional alcohol use. No history of drug use.  REVIEW OF SYSTEMS: CONSTITUTIONAL: Denies any fever, fatigue, or weakness. EYES: Denies any blurry vision or double vision. ENT: Denies any tinnitus, ear pain, or hearing loss. RESPIRATORY: Complaining of productive cough, shortness of breath, and wheezing. Denies any hemoptysis. CARDIOVASCULAR: Has chest tightness. Denies any chest pain per se. Denies any palpitations or syncope. GASTROINTESTINAL: Denies any nausea, vomiting, diarrhea, or abdominal pain. GENITOURINARY: Denies any dysuria, polyuria, hematuria, or renal colic. ENDOCRINE: Denies any polyuria, polydipsia, or heat or cold intolerance. INTEGUMENT: Denies any acne, rash or itching. MUSCULOSKELETAL: Denies any neck pain, shoulder pain, or lower back pain. NEUROLOGIC: Denies any weakness, dysarthria, epilepsy, or tremor. PSYCH: Denies any alcohol or drug abuse or any history of anxiety.   PHYSICAL EXAMINATION:   VITAL SIGNS: Blood pressure 130/80, respirations 18, pulse 84, and temperature 98.7.   GENERAL: Currently comfortable, no acute cardiopulmonary distress.   HEENT: Pupils equal and reactive to light. Extraocular movements intact. Head is atraumatic, normocephalic.   NECK: Supple. No thyromegaly. No JVD.   LUNGS: Bibasilar crackles at the bases with mild wheezing. No accessory muscle use.  CARDIOVASCULAR: S1 and S2 regular rate and rhythm. No murmurs, rubs, or gallops.   ABDOMEN: Soft, distended, obese. Normoactive bowel sounds. Nontender.   EXTREMITIES: 1+ pitting edema in bilateral lower extremities. No clubbing and no cyanosis.    SKIN: No skin rashes or acne.   NEUROLOGIC: Cranial nerves II through XII are grossly intact. Motor strength intact, 5/5 in bilateral upper and lower extremities.   PSYCHIATRIC: Appropriate mood and affect. Awake, alert, and oriented x3.   LABORATORY DATA/DIAGNOSTICS: Glucose 84. BNP 3900. BUN 16, creatinine 1.10, sodium 142, potassium 3.0, and chloride 101. Anion gap 9. Troponin 0.08. WBCs 11.0. Hemoglobin 14.2, hematocrit 42.4, and platelet count 167.   ABG shows a pH of 7.45, pCO2 44, and  pO2 64.   ASSESSMENT AND PLAN:  1. Shortness of breath, multifactorial, secondary to congestive heart failure exacerbation/chronic obstructive pulmonary disease exacerbation. The patient has a BNP which is significantly elevated compared to her previous numbers and therefore the patient will be diuresed aggressively. We will start her on IV Lasix and replete her potassium aggressively while she is diuresing. Monitor renal function closely. Hold Aldactone but continue Diovan. Continue Coreg and aspirin. Also start the patient on nitro paste.  2. Elevated cardiac enzymes. Previous work-up in April 2013. The patient needs to be medically managed. She had a cardiology consultation done and medical therapy was recommended. If the troponins continue to trend up, then Dr. Juliann Paresallwood can be reconsulted.  3. Chronic obstructive pulmonary disease exacerbation. The patient will be started on low dose Solu-Medrol, continued on Flonase, IV steroids, and empiric antibiotics with levofloxacin for her productive cough, as well as DuoNeb treatments.  4. Diabetes. The patient's metformin will be held and she will be started on NovoLog and Lantus.  5. Dyslipidemia. The patient will be continued on Crestor.  6. Hypertension. The patient will be continued on Coreg, Lasix, and Diovan.  7. Sleep apnea. The patient will be continued on nocturnal CPAP.  8. Depression. The patient will be continued on Venlafaxine.  CODE STATUS: IS  FULL CODE.   TIME SPENT ON ADMISSION: 70 minutes. ____________________________ Richarda OverlieNayana Kamyla Olejnik, MD na:slb D: 12/03/2011 22:33:04 ET T: 12/04/2011 07:45:39 ET JOB#: [id number]  cc: Richarda OverlieNayana Jonnae Fonseca, MD, <Dictator> Hamilton CapriSezmin Noorani, MD Richarda OverlieNAYANA Edilson Vital MD ELECTRONICALLY SIGNED 12/05/2011 23:57

## 2014-08-26 NOTE — Op Note (Signed)
PATIENT NAME:  [name], Alexis W MR#:  [id number] DATE OF BIRTH:  November 07, 1955  DATE OF PROCEDURE:  04/18/2014   PREOPERATIVE DIAGNOSIS:  Nuclear sclerotic cataract of the left eye.   POSTOPERATIVE DIAGNOSIS:  Nuclear sclerotic cataract of the left eye.   OPERATIVE PROCEDURE:  Cataract extraction by phacoemulsification with implant of intraocular lens to left eye.   SURGEON:  Galen ManilaWilliam Lakeem Rozo, MD.   ANESTHESIA:  1. Managed anesthesia care.  2. Topical tetracaine drops followed by 2% Xylocaine jelly applied in the preoperative holding area.   COMPLICATIONS:  None.   TECHNIQUE:   Stop and chop   DESCRIPTION OF PROCEDURE:  The patient was examined and consented in the preoperative holding area where the aforementioned topical anesthesia was applied to the left eye and then brought back to the Operating Room where the left eye was prepped and draped in the usual sterile ophthalmic fashion and a lid speculum was placed. A paracentesis was created with the side port blade and the anterior chamber was filled with viscoelastic. A near clear corneal incision was performed with the steel keratome. A continuous curvilinear capsulorrhexis was performed with a cystotome followed by the capsulorrhexis forceps. Hydrodissection and hydrodelineation were carried out with BSS on a blunt cannula. The lens was removed in a stop and chop technique and the remaining cortical material was removed with the irrigation-aspiration handpiece. The capsular bag was inflated with viscoelastic and the Tecnis ZCB00 22.5-diopter lens, serial number [id number] was placed in the capsular bag without complication. The remaining viscoelastic was removed from the eye with the irrigation-aspiration handpiece. The wounds were hydrated. The anterior chamber was flushed with Miostat and the eye was inflated to physiologic pressure. 0.1 mL of cefuroxime concentration 10 mg/mL was placed in the anterior chamber. The wounds were found to be water  tight. The eye was dressed with Vigamox. The patient was given protective glasses to wear throughout the day and a shield with which to sleep tonight. The patient was also given drops with which to begin a drop regimen today and will follow-up with me in one day.    ____________________________ Jerilee FieldWilliam L. Trysten Berti, MD wlp:jp D: 04/18/2014 21:58:55 ET T: 04/19/2014 08:23:50 ET JOB#: [id number]  cc: Charlett Merkle L. Shahidah Nesbitt, MD, <Dictator> Jerilee FieldWILLIAM L Unique Sillas MD ELECTRONICALLY SIGNED 04/19/2014 13:56

## 2014-08-27 NOTE — Consult Note (Signed)
Chief Complaint:   Subjective/Chief Complaint Pt still having some cp and angina. SOB dyspnea.edema.   VITAL SIGNS/ANCILLARY NOTES: **Vital Signs.:   21-Apr-13 12:22   Vital Signs Type Q 4hr   Temperature Temperature (F) 97.7   Celsius 36.5   Temperature Source oral   Pulse Pulse 75   Pulse source per Dinamap   Respirations Respirations 16   Systolic BP Systolic BP 595   Diastolic BP (mmHg) Diastolic BP (mmHg) 80   Mean BP 99   BP Source Dinamap   Pulse Ox % Pulse Ox % 94   Pulse Ox Activity Level  At rest   Oxygen Delivery Room Air/ 21 %   Brief Assessment:   Cardiac Regular  murmur present  + LE edema  + JVD  --Gallop    Respiratory normal resp effort  rhonchi  crackles    Gastrointestinal Normal    Gastrointestinal details normal Soft  Nontender  Nondistended  No masses palpable   Routine Hem:  21-Apr-13 05:21    WBC (CBC) 14.0   RBC (CBC) 5.18   Hemoglobin (CBC) 13.8   Hematocrit (CBC) 43.4   Platelet Count (CBC) 140   MCV 84   MCH 26.7   MCHC 31.9   RDW 16.2   Neutrophil % 77.2   Lymphocyte % 16.9   Monocyte % 5.6   Eosinophil % 0.1   Basophil % 0.2   Neutrophil # 10.8   Lymphocyte # 2.4   Monocyte # 0.8   Eosinophil # 0.0   Basophil # 0.0  Routine Chem:  21-Apr-13 05:21    Glucose, Serum 164   BUN 27   Creatinine (comp) 0.96   Sodium, Serum 139   Potassium, Serum 3.5   Chloride, Serum 102   CO2, Serum 30   Calcium (Total), Serum 8.6  Hepatic:  21-Apr-13 05:21    Bilirubin, Total 0.3   Alkaline Phosphatase 68   SGPT (ALT) 22   SGOT (AST) 20   Total Protein, Serum 6.8   Albumin, Serum 2.8  Routine Chem:  21-Apr-13 05:21    Osmolality (calc) 286   eGFR (African American) >60   eGFR (Non-African American) >60   Anion Gap 7   Magnesium, Serum 2.0  Blood Glucose:  21-Apr-13 07:35    POCT Blood Glucose 173    12:26    POCT Blood Glucose 283  Lab:  21-Apr-13 13:40    pH (ABG) 7.40   PCO2 46   PO2 58   FiO2 21   Base Excess 3.0    HCO3 28.5   O2 Saturation 89.7   O2 Device ra   Specimen Type (ABG) ARTERIAL   Patient Temp (ABG) 37.0  Blood Glucose:  21-Apr-13 16:55    POCT Blood Glucose 238   Radiology Results: XRay:    20-Apr-13 06:33, Chest Portable Single View   Chest Portable Single View    REASON FOR EXAM:    sob  COMMENTS:       PROCEDURE: DXR - DXR PORTABLE CHEST SINGLE VIEW  - Aug 23 2011  6:33AM     RESULT: Comparison is made to a prior study dated 01/25/2011.    There is thickening of the interstitial markings and peribronchial   cuffing. No focal regions of consolidation are identified. The cardiac   silhouette is moderately enlarged. The visualized bony skeleton is   unremarkable.    IMPRESSION:      1. Interstitial infiltrate likely representing a component of pulmonary  edema.   2. No focal regions of consolidation are appreciated.      Thank you for the opportunity to contribute to the care of your patient.           Verified By: Mikki Santee, M.D., MD    21-Apr-13 20:45, Chest PA and Lateral   Chest PA and Lateral    REASON FOR EXAM:    chf follow up  COMMENTS:       PROCEDURE: DXR - DXR CHEST PA (OR AP) AND LATERAL  - Aug 24 2011  8:45PM     RESULT: Comparison is made to the study of 23 August 2011. There is mild   interstitial edema. The heart is borderline enlarged. Central pulmonary   vascular congestion and peribronchial cuffing is present. Monitoring   electrodes are present.    IMPRESSION:   1. No significant effusion. Persistent interstitial edema and   peribronchial thickening. Mild cardiomegaly.        Verified By: Sundra Aland, M.D., MD  Lab:    21-Apr-13 13:40, ABG   pH (ABG) 7.40   PCO2 46   PO2 58   FiO2 21   Base Excess 3.0   HCO3 28.5   O2 Saturation 89.7   O2 Device ra   Specimen Site (ABG)    LT BRACHIAL   Specimen Type (ABG) ARTERIAL   Patient Temp (ABG) 37.0   Result(s) reported on 24 Aug 2011 at 01:47PM.  Cardiology:     20-Apr-13 06:20, ED ECG   Ventricular Rate 84   Atrial Rate 84   P-R Interval 128   QRS Duration 86   QT 386   QTc 456   P Axis 52   R Axis 12   T Axis 78   ECG interpretation    Normal sinus rhythm  Nonspecific T wave abnormality  Abnormal ECG  When compared with ECG of 12-Apr-2011 19:50,  No significant change was found  ----------unconfirmed----------  Confirmed by OVERREAD, NOT ([id number]), editor PEARSON, BARBARA ([id number]) on 08/25/2011 8:57:59 AM   ED ECG    Assessment/Plan:  Invasive Device Daily Assessment of Necessity:   Does the patient currently have any of the following indwelling devices? none   Assessment/Plan:   Assessment IMP  NQMI CHF CM HTN poorly controlled DM Obesity Smoking Hyperlipidemia PVD SOB    Plan PLAN Tele Continue Lasix ASA 81 mg qd Agree with DM control Advance HTN med to help with better control Wgt loss and exerise PVD hx with leg weakness will Check renal for ASCVD Advise ti quit soking Contine Statin therpy Agree with ACE for CHF CM Rec cardiac cath in AM   Electronic Signatures: Lujean Amel D (MD)  (Signed 22-Apr-13 14:55)  Authored: Chief Complaint, VITAL SIGNS/ANCILLARY NOTES, Brief Assessment, Lab Results, Radiology Results, Assessment/Plan   Last Updated: 22-Apr-13 14:55 by Yolonda Kida (MD)

## 2014-08-27 NOTE — Discharge Summary (Signed)
PATIENT NAME:  Alexis Ray, Alexis Ray MR#:  [id number] DATE OF BIRTH:  Jan 21, 1956  DATE OF ADMISSION:  08/23/2011 DATE OF DISCHARGE:  08/25/2011  ADMITTING DIAGNOSES:  1. Shortness of breath. 2. Congestive heart failure.   DISCHARGE DIAGNOSES:   1. Dyspnea as well as chest pain.  2. Congestive heart failure, left heart, acute systolic.  3. Cardiomyopathy, ejection fraction of 25%. 4. Severe peripheral vascular disease.  5. Chronic obstructive pulmonary disease with ongoing tobacco abuse.  6. Elevated troponin, likely demand-supply ischemia.  7. Diabetes mellitus, insulin-dependent.  8. Hypertension.  9. Hyperlipidemia. 10. Hypokalemia.  11. Elevated transaminases.  12. Cerebrovascular accident. 13. Osteoarthritis. 14. Neuropathy. 15. Gastroesophageal reflux disease.   DISCHARGE CONDITION: Stable.   DISCHARGE MEDICATIONS: The patient is to resume her outpatient medications which are: 1. Cyclobenzaprine 10 mg p.o. at bedtime.  2. Albuterol 2 puffs or 90 mcg inhaler every 4 hours as needed.  3. Aspirin 81 mg p.o. daily.  4. Oxygen at 2 liters of oxygen through nasal cannula at nighttime with CPAP.  5. Diovan 40 mg p.o. daily.  6. Venlafaxine 225 mg p.o. daily.  7. NovoLog FlexPen  5 units subcutaneously with each meal. 8. Mirapex 0.25 mg p.o. at bedtime.  9. Vitamin D3, 2000 units once daily.  10. Lantus 55 units subcutaneously at bedtime.  11. Iron gluconate 324 mg p.o. daily.  12. Zestril 40 mg p.o. at bedtime.  13. Furosemide 80 mg p.o. twice daily.  14. Symbicort 160/4.5 mg, 2 puffs twice daily.   ADDITIONAL MEDICATIONS:  1. Sliding scale insulin.  2. Norco 5/325 mg, 1 tablet every 6 hours as needed.  3. Nitroglycerin patch 0.1 mg topically daily.  4. Omeprazole 40 mg p.o. daily.  5. Nicotine patch 21 mg topically daily.  6. Neurontin 400 mg p.o. twice daily.  7. Lyrica 150 mg p.o. twice daily.  8. Coreg 25 mg p.o. twice daily.  9. K-Dur 20 mEq p.o. daily. The patient was  advised to get  her potassium checked in 2 to 3 days after discharge.  10. Maalox suspension 15 to 30 mL every 4 hours as needed.   DIET: 1800 ADA, 2 grams salt, low fat, low cholesterol.   PHYSICAL ACTIVITY LIMITATIONS: As tolerated.    FOLLOWUP:  1. Follow-up appointment with Dr. Juliann Pares in 2 days after discharge.  2. Follow-up appointment with Dr. Hamilton Capri in 2 days after discharge.    CONSULTANTS: Dr. Juliann Pares, Cardiology.   LABORATORY, DIAGNOSTIC AND RADIOLOGICAL DATA:  Chest x-ray, portable single view on 08/23/2011, revealed interstitial infiltrate likely representing a competent of pulmonary edema. No focal regions of consolidation appreciated.  Repeated chest x-ray, PA and lateral on 08/24/2011, showed no significant effusion, persistent interstitial edema and peribronchial thickening. Mild cardiomegaly was noted.   PROCEDURES: Cardiac catheterization done on 08/25/2011 by Dr. Juliann Pares revealing severe peripheral vascular disease, patent iliacs. Medical therapy for congestive heart failure was recommended. Muscle bridge in distal LAD was noted. The patient was noted to have cardiomyopathy with ejection fraction of 25% with mitral regurgitation and congestive heart failure.  Her lab data initially on arrival to the hospital showed B-type natriuretic peptide 1716. Glucose was 217, BUN 19, potassium 3.4, otherwise BMP was unremarkable.  The patient's liver enzymes revealed AST elevation to 39, albumin level of 2.9.  Troponin was slightly elevated at 0.14 on the first set, 0.14 on the second set, as well as 0.13 on the third set  CBC: White blood cell count 9.2. Hemoglobin level  of 14.7, platelet count 136 with no left shift.   HISTORY AND PHYSICAL: The patient is a 59 year old African American female with a history of congestive heart failure, obesity, diabetes, peripheral vascular disease, and chronic obstructive pulmonary disease who presented to the hospital with complaints of  shortness of breath. Please refer to Dr. Teena IraniElgergawy's admission note on 08/23/2011.   On arrival to the Emergency Room, the patient's vitals showed temperature of 98, pulse 88, respiration rate 22, and blood pressure 151/81. Saturation was 96% on oxygen therapy. Her physical exam revealed bibasilar crackles as well as rales but no wheezing, trace lower extremity to 1+ and  2+ bilateral lower extremity edema. Her EKG showed normal sinus rhythm at 84 beats per minute, nonspecific T wave abnormality; but no acute ST-T changes were noted. Her chest x-ray was concerning for possible mild congestive heart failure. The patient was admitted to the hospital.   HOSPITAL COURSE: The patient was admitted to the hospital. She was started on IV diuretics, and her condition somewhat improved but not significantly. She was also continued on DuoNebs every 4 hours. She was evaluated by Dr. Juliann Paresallwood, who felt that because of her mild elevation of troponin as well as underlying peripheral vascular disease, diabetes, hypertension, hyperlipidemia, she warranted cardiac catheterization. Cardiac catheterization was performed on the 08/25/2011. It revealed cardiomyopathy with ejection fraction of 25% and congestive heart failure, peripheral vascular disease with normal renals. Medical therapy for congestive heart failure was recommended. No significant obstructive coronary disease was noted; however, the patient had a muscle bridge in distal LAD. It was felt that the patient would benefit from congestive heart failure clinic, and it was also felt that the patient's dyspnea very likely was due to cardiomyopathy as well as mild congestive heart failure, but mostly cardiomyopathy as well as deconditioning The patient is being discharged in stable condition with the above-mentioned medications and follow-up. Vital signs on the day of discharge were temperature 97.6, pulse 67, respiration rate 16 to 17, blood pressure 115/75, saturation 92 to  94% on room air at rest as well as on exertion. The patient was evaluated for oxygen need at home. However, she failed to qualify with oxygen saturations of 94% on exertion. She is, however, to continue oxygen therapy at bedtime as previously scheduled.  In regards to her congestive heart failure, she is to continue Lasix as well as Diovan and Coreg. She is to continue to follow her weight and follow up with congestive heart failure clinic. For blood pressure management, she is to continue nitroglycerin topically to improve her diuresis; and she was also advised to add on potassium supplements because of use of high doses of Lasix as well as hypokalemia. However, she was instructed to get her potassium checked in the next few days after discharge to ensure her potassium is not creeping up in view of her use of ARB medications as well as spironolactone.   For peripheral vascular disease, the patient is to continue aspirin therapy as well as continue cholesterol-lowering medications. Unfortunately, a lipid panel was not performed while the patient was in the hospital. It is recommended to follow the patient's lipid panel as outpatient and make decisions about advancement of her medications, if needed. The patient was, however, instructed to continue low fat, low cholesterol diet.   For chronic obstructive pulmonary disease and tobacco abuse, the patient was advised to stop smoking and continue her inhalation therapy. As mentioned above, a trial with Duo-Neb was given around-the-clock; however, that  was to improve the patient's dyspnea. In regards to elevated troponin, it was felt to be likely supply/demand ischemia as the patient was not noted to have any obstructive lesions in her coronary arteries.   For diabetes mellitus, that was insulin-dependent. The patient's glucose levels were fluctuating. Hemoglobin A1c was checked and was found to be markedly elevated to 10.7. The patient was advised to follow up  with her primary care physician for further recommendations. We did not advance her insulin due to the patient's blood glucose level was dipping down to 30s while in the hospital, very likely due to her restricted diet.   For hypertension as well as hyperlipidemia, the patient is to continue her previously mentioned medications. For hyperlipidemia, as mentioned above, the patient is to continue cholesterol-lowering medication. Lipid panel was not performed while in the hospital.   The patient's hypokalemia was supplemented orally and it resolved. The patient was noted to have elevated transaminases which were felt to be due to passive liver congestion. It is recommended to follow the patient's liver panel testing to ensure the patient's liver enzyme abnormalities. By 08/24/2011, the patient's liver enzymes normalized; however, they may increase as the patient is fluid overloaded. She is to continue fluid restrictions if needed and diuresis.   For history of stroke as well as history of neuropathy, arthritis, and gastroesophageal reflux disease, the patient is to continue her outpatient medications.  DISCHARGE VITAL SIGNS: The patient is being discharged in stable condition with the above-mentioned medications and followup. Her vital signs on the day of discharge: Temperature, as mentioned above, was normal at 97.6, pulse 67, respirations 16, blood pressure 115/75, saturation 92 to 94% on room air at rest as well as on exertion.   ____________________________ Katharina Caper, MD rv:cbb D: 08/25/2011 18:36:37 ET T: 08/26/2011 10:43:55 ET JOB#: [id number]  cc: Katharina Caper, MD, <Dictator> Hamilton Capri, MD at [facility]  Avan Gullett MD ELECTRONICALLY SIGNED 08/26/2011 20:07

## 2014-08-27 NOTE — H&P (Signed)
PATIENT NAME:  Alexis Ray, BRUCKER MR#:  [id number] DATE OF BIRTH:  [birth year]  DATE OF ADMISSION:  08/23/2011   PRIMARY MEDICAL PHYSICIAN: Dr. Hamilton Capri   REFERRING PHYSICIAN: Dr. Darnelle Catalan     CHIEF COMPLAINT: Shortness of breath.   HISTORY OF PRESENT ILLNESS: Alexis Ray is a 59 year old female with significant past medical history of hypertension, diabetes mellitus, insulin-dependent, hyperlipidemia, history of peripheral vascular disease, history of CVA, osteoarthritis, neuropathy, and hyperlipidemia who presents with complaint of shortness of breath. The patient reports she is usually sleeping at night using 2 liters with CPAP. The patient reports she woke up in the middle of the night with severe shortness of breath with some chest discomfort. The patient reports she has been admitted last year at [facility] for acute CHF for the same complaint. The patient does not recall if she had any echo done then or what was her ejection fraction. By reviewing our records there is no report of any echo done here.   The patient had a chest x-ray done which showed evidence of pulmonary edema. The patient has evidence of acute CHF. As well, the patient has elevated BNP. The patient's symptoms much improved after receiving IV Lasix. As well, the patient did have elevated troponin of 0.14. She denies any chest pain but has complaints of chest tightness or chest discomfort which improved after she was given aspirin and nitro patch. The patient was given four baby aspirin in the ED. As well, she was given sub-Q Lovenox 1 mg/kg treatment dose. The patient has been reporting increase in her weight recently with worsening lower extremity edema. The patient reports she has been compliant with salt with her medication but she reports she has been having increased fluid intake.   PAST MEDICAL HISTORY:  1. Obesity.  2. Hypertension.  3. Diabetes, on insulin.  4. Hyperlipidemia.  5. History of peripheral vascular  disease.   6. History of cerebrovascular accident.  7. Osteoarthritis.  8. Neuropathy.  9. Hyperlipidemia.  10. Chronic obstructive pulmonary disease.   MEDICATIONS:  1. Albuterol inhalation every four hours as needed.  2. Aspirin 81 mg daily.  3. Coreg 12.5 two times a day.  4. Crestor 40 mg at bedtime.  5. Cyclobenzaprine 10 mg at bedtime.  6. Diovan 40 mg daily.  7. Ferrous gluconate 324 mg daily.  8. Fluticasone nasal spray as directed.  9. Lasix 80 mg oral 2 times a day.  10. Gabapentin 600 mg oral 2 times a day.  11. Lantus 55 units every morning.  12. Lyrica 150 mg 2 times a day.  13. Metformin 1000 mg twice a day.  14. Flagyl 500 mg every eight hours.  15. Mirapex 0.25 mg at bedtime. 16. Nexium 40 mg daily.  17. NovoLog sub-Q 5 units before meals. 18. Oxygen 2 liters at night with CPAP.  19. Aldactone 25 mg daily.  20. Symbicort 160/4.5 two puffs twice a day.  21. Venlafaxine 225 mg oral daily.  22. Vitamin C 500 one tablet daily.  23. Vitamin D3 2000 one tablet daily.   ALLERGIES: No history of drug allergies.    FAMILY HISTORY: Positive for coronary artery disease.   SOCIAL HISTORY: The patient continues to smoke half pack per day. Occasional alcohol. No history of drug use.   REVIEW OF SYSTEMS: Denies any fever, fatigue, weakness. EYES: Denies any blurry vision, double vision or pain. ENT: Denies any tinnitus, ear pain, hearing loss. RESPIRATORY: Denies any cough. Has complaints  of shortness of breath, wheezing. Denies any hemoptysis. CARDIOVASCULAR: Has chest tightness. Denies any orthopnea. Has worsening edema. Denies any palpitations, syncope. GI: Denies any nausea, vomiting, diarrhea, abdominal pain. GU: Denies any dysuria, polyuria, hematuria, renal colic. ENDOCRINE: Denies any polyuria, polydipsia, heat or cold intolerance. INTEGUMENTARY: Denies any acne, rash, or itching. MUSCULOSKELETAL: Denies any neck pain, shoulder pain, lower back pain. NEUROLOGIC: Denies  any weakness, dysarthria, epilepsy, tremors. PSYCH: Denies any alcohol or drug abuse, any history of anxiety. Has history of depression.   PHYSICAL EXAMINATION:   VITAL SIGNS: Temperature 98, pulse 88, respiratory rate 22, blood pressure 151/81, saturating 96% on room air.   GENERAL: Obese female sitting comfortable in bed in no apparent distress.   HEENT: Head atraumatic, normocephalic. Pupils equal and reactive to light. Pink conjunctivae. Anicteric sclerae. Moist oral mucosa.   NECK: Supple. No thyromegaly. No JVD.   CHEST: The patient had bibasilar crackles and rales. No wheezing.   CARDIOVASCULAR: S1, S2 heard. No rubs, murmur, or gallop.   ABDOMEN: Soft, nontender, nondistended. Bowel sounds present.   EXTREMITIES: +1 to 2 bilateral edema. No clubbing. No cyanosis.   SKIN: No rash.    NEUROLOGIC: Cranial nerves grossly intact. Motor 5 out of 5 in four extremities.   PSYCHIATRIC: Appropriate affect. Awake, alert x3. Intact judgment and insight.   PERTINENT LABS: Glucose 317. BNP  1716. BUN 19, creatinine 0.89, sodium 142, potassium 3.4, chloride 102, calcium 8.5. Troponin 0.14. White blood cells 9.2, hemoglobin 14.7, hematocrit 45.1, platelets 136.   ASSESSMENT AND PLAN:  1. Acute congestive heart failure exacerbation, appears to be systolic. The patient had no 2-D echo recently done. Will obtain echo for evaluation of systolic function or diastolic dysfunction as well. Meanwhile, continue patient on IV Lasix 80 mg twice a day. She is on ARB. She is on beta-blocker and she is on aspirin as well. 2. Elevated cardiac enzymes. The patient has positive troponin. Will cycle cardiac enzymes. Will start patient on aspirin and sub-Q Lovenox treatment dose for possible acute coronary syndrome. Discussed with Cardiology, Dr. Juliann Paresallwood.   3. Chronic obstructive pulmonary disease. The patient does not appear to be in active COPD exacerbation. Will continue her on nebulizers and Symbicort.   4. Diabetes. Will hold the patient's metformin. Will continue her on NovoLog sliding scale and Lantus daily.  5. Hyperlipidemia. Will continue the patient on Crestor.  6. Hypertension. Will continue the patient on Coreg, Lasix, and Diovan.  7. Obstructive sleep apnea. Will continue the patient on nocturnal CPAP.  8. Tobacco abuse. The patient was counseled. Will start on nicotine patch.  9. Depression. Will continue the patient on venlafaxine.  10. GI prophylaxis. Continue patient on Protonix. 11. DVT prophylaxis. The patient is on treatment dose of Lovenox for possible acute coronary syndrome.   CODE STATUS: The patient is FULL CODE.   TOTAL TIME SPENT ON PATIENT CARE: 60 minutes.   ____________________________ Starleen Armsawood S. Elgergawy, MD dse:drc D: 08/23/2011 08:39:21 ET T: 08/23/2011 09:14:43 ET JOB#: [id number]  cc: Starleen Armsawood S. Elgergawy, MD, <Dictator> Dr. Hamilton CapriNoorani Sezmin DAWOOD Teena IraniS ELGERGAWY MD ELECTRONICALLY SIGNED 08/24/2011 1:12

## 2014-08-27 NOTE — Consult Note (Signed)
PATIENT NAME:  Alexis Ray, Alexis Ray MR#:  [id number] DATE OF BIRTH:  Dec 05, 1955  DATE OF CONSULTATION:  08/23/2011  REFERRING PHYSICIAN:  Dr. Winona Legato, [facility]  CONSULTING PHYSICIAN:  Kashden Deboy D. Ariya Bohannon, MD  INDICATION: Shortness of breath, congestive heart failure.   PRIMARY CARE PHYSICIAN: Dr. Misty Stanley at [facility]    HISTORY OF PRESENT ILLNESS: Alexis Ray is a 59 year old female with a past history of hypertension, diabetes insulin-dependent, hyperlipidemia, peripheral vascular disease, history of cerebrovascular accident, osteoarthritis, neuropathy, and hyperlipidemia who presented with shortness of breath. The patient reports that she is usually sleeping at night on 2 liters with CPAP. The patient woke up in the middle of the night with severe shortness of breath and chest discomfort. The patient reports she had been admitted to [facility] for acute CHF in the past. Does not recall if she had an echo done, doesn't know her ejection fraction. She was treated medically and discharged. The patient had a chest x-ray done which showed pulmonary edema, CHF, elevated BNP. Symptoms improved after IV Lasix. She had borderline troponin at 0.14. Chest pain was better, still had some mild tightness. She got some nitro patch and aspirin and anticoagulation and appeared to be improved after Lasix therapy.   REVIEW OF SYSTEMS: No blackout spells or syncope. Denies nausea or vomiting. Denies fever, chills, sweats. No weight loss. No weight gain. No hemoptysis or hematemesis. No bright red blood per rectum.   PAST MEDICAL HISTORY:  1. Obesity.  2. Hypertension.  3. Diabetes. 4. Hyperlipidemia. 5. Peripheral vascular disease.   6. Cerebrovascular accident.  7. Osteoarthritis.  8. Neuropathy.  9. Hyperlipidemia.  10. Chronic obstructive pulmonary disease.   MEDICATIONS:  1. Albuterol q.4 p.r.n.  2. Aspirin 81 a day. 3. Coreg 12.5 twice a day.  4. Crestor 40 at bedtime.  5. Cyclobenzaprine 10 mg at bedtime.  6. Diovan  40 a day.  7. Iron 324 a day. 8. Fluticasone nasal spray as directed.  9. Lasix 80 mg twice a day.  10. Gabapentin 600 twice a day.  11. Lantus 55 units every morning. 12. Lyrica 150 two times a day.  13. Metformin 1000 twice a day.  14. Flagyl 500 every eight hours.  15. Mirapex 0.25 mg at bedtime.  16. Nexium 40 a day.  17. NovoLog sub-Q 5 units before meals.  18. 2 liters of oxygen with CPAP. 19. Aldactone 25 daily.  20. Symbicort 160/4.5 two puffs twice daily. 21. Venlafaxine 225 daily.  22. Vitamin C.  23. Vitamin D.   ALLERGIES: None.   FAMILY HISTORY: Coronary artery disease, hypertension.   SOCIAL HISTORY: She is a smoker. Occasional alcohol. No drug abuse.   PHYSICAL EXAMINATION:   VITAL SIGNS: Blood pressure 150/80, pulse 85, respiratory rate 18, afebrile.   HEENT: Normocephalic, atraumatic. Pupils equal and reactive to light. No significant respiratory distress.   NECK: Supple. No JVD, bruits, or adenopathy.   LUNGS: Bilateral rhonchi. Faint rales in the bases. Adequate air movement.   HEART: Regular rate and rhythm. Positive S4. Systolic ejection murmur left sternal border.   ABDOMEN: Benign. Positive bowel sounds. No rebound, guarding, or tenderness.   EXTREMITIES: Within normal limits with 1 to 2+ edema.   NEUROLOGIC: Grossly intact.   SKIN: Normal.   LABORATORY, DIAGNOSTIC, AND RADIOLOGICAL DATA: Glucose 317. BNP 1716. BUN 19, creatinine 0.89, sodium 142, potassium 3.4. Troponin 0.14. White count 9, hemoglobin 14.7, hematocrit 45, platelet count 136.   Chest x-ray again congestive heart failure.  EKG normal sinus rhythm, nonspecific ST-T changes.  ASSESSMENT:  1. Congestive heart failure. 2. Possible unstable angina. 3. Elevated troponins. 4. Possible non-Q-wave myocardial infarction. 5. Chronic obstructive pulmonary disease. 6. Diabetes. 7. Hyperlipidemia. 8. Hypertension. 9. Obstructive sleep  apnea. 10. Smoking. 11. Diabetes. 12. Gastroesophageal reflux disease.   PLAN:  1. Agree with admit.  2. Rule out for myocardial infarction.  3. Follow cardiac enzymes.  4. Follow-up EKGs.  5. Continue supplemental O2.  6. Continue Lasix therapy.  7. Telemetry will be helpful.  8. Anticoagulation for now until we are sure she is ruled out.  9. Continue beta-blockade therapy.  10. Echocardiogram again will be useful. 11. Continue COPD therapy with inhalers. 12. Continue CPAP at night. 13. Diabetes management with monitoring her glucose.  14. Advised the patient to quit smoking.  15. Blood pressure control with multiple drug therapy.  16. Will discuss compliance with the patient. 17. Base further evaluation on these studies.   ____________________________ Bobbie Stackwayne D. Juliann Paresallwood, MD ddc:drc D: 08/24/2011 12:44:07 ET T: 08/24/2011 13:29:24 ET JOB#: [id number]  cc: Lashundra Shiveley D. Juliann Paresallwood, MD, <Dictator> Alwyn PeaWAYNE D Magdelena Kinsella MD ELECTRONICALLY SIGNED 09/10/2011 22:50

## 2015-01-21 ENCOUNTER — Emergency Department
Admission: EM | Admit: 2015-01-21 | Discharge: 2015-01-22 | Disposition: A | Discharge status: Home / Self Care | Payer: Medicare HMO | Attending: Emergency Medicine | Admitting: Emergency Medicine

## 2015-01-21 ENCOUNTER — Emergency Department: Payer: Medicare HMO

## 2015-01-21 DIAGNOSIS — R000 Tachycardia, unspecified: Secondary | ICD-10-CM | POA: Diagnosis not present

## 2015-01-21 DIAGNOSIS — R778 Other specified abnormalities of plasma proteins: Secondary | ICD-10-CM

## 2015-01-21 DIAGNOSIS — A419 Sepsis, unspecified organism: Secondary | ICD-10-CM | POA: Diagnosis not present

## 2015-01-21 DIAGNOSIS — R4182 Altered mental status, unspecified: Secondary | ICD-10-CM | POA: Diagnosis present

## 2015-01-21 DIAGNOSIS — R7989 Other specified abnormal findings of blood chemistry: Secondary | ICD-10-CM | POA: Diagnosis not present

## 2015-01-21 LAB — URINALYSIS COMPLETE WITH MICROSCOPIC (ARMC ONLY)
Bilirubin Urine: NEGATIVE
Glucose, UA: 50 mg/dL — AB
Ketones, ur: NEGATIVE mg/dL
LEUKOCYTES UA: NEGATIVE
Nitrite: NEGATIVE
PH: 5 (ref 5.0–8.0)
PROTEIN: 100 mg/dL — AB
SPECIFIC GRAVITY, URINE: 1.015 (ref 1.005–1.030)

## 2015-01-21 LAB — TROPONIN I: Troponin I: 0.87 ng/mL — ABNORMAL HIGH (ref <0.031)

## 2015-01-21 LAB — CBC WITH DIFFERENTIAL/PLATELET
Basophils Absolute: 0.1 10*3/uL (ref 0–0.1)
Basophils Relative: 0 %
Eosinophils Absolute: 0 10*3/uL (ref 0–0.7)
Eosinophils Relative: 0 %
HEMATOCRIT: 41.6 % (ref 35.0–47.0)
Hemoglobin: 13.3 g/dL (ref 12.0–16.0)
LYMPHS PCT: 4 %
Lymphs Abs: 0.5 10*3/uL — ABNORMAL LOW (ref 1.0–3.6)
MCH: 24.6 pg — ABNORMAL LOW (ref 26.0–34.0)
MCHC: 32 g/dL (ref 32.0–36.0)
MCV: 77 fL — AB (ref 80.0–100.0)
MONO ABS: 0.8 10*3/uL (ref 0.2–0.9)
MONOS PCT: 6 %
NEUTROS ABS: 11 10*3/uL — AB (ref 1.4–6.5)
Neutrophils Relative %: 90 %
Platelets: 154 10*3/uL (ref 150–440)
RBC: 5.4 MIL/uL — ABNORMAL HIGH (ref 3.80–5.20)
RDW: 20 % — AB (ref 11.5–14.5)
WBC: 12.4 10*3/uL — ABNORMAL HIGH (ref 3.6–11.0)

## 2015-01-21 LAB — COMPREHENSIVE METABOLIC PANEL
ALBUMIN: 3.7 g/dL (ref 3.5–5.0)
ALT: 17 U/L (ref 14–54)
AST: 38 U/L (ref 15–41)
Alkaline Phosphatase: 50 U/L (ref 38–126)
Anion gap: 14 (ref 5–15)
BUN: 51 mg/dL — AB (ref 6–20)
CHLORIDE: 99 mmol/L — AB (ref 101–111)
CO2: 22 mmol/L (ref 22–32)
Calcium: 8.9 mg/dL (ref 8.9–10.3)
Creatinine, Ser: 2.11 mg/dL — ABNORMAL HIGH (ref 0.44–1.00)
GFR calc Af Amer: 29 mL/min — ABNORMAL LOW (ref >60)
GFR calc non Af Amer: 25 mL/min — ABNORMAL LOW (ref >60)
GLUCOSE: 155 mg/dL — AB (ref 65–99)
POTASSIUM: 4.1 mmol/L (ref 3.5–5.1)
SODIUM: 135 mmol/L (ref 135–145)
Total Bilirubin: 0.6 mg/dL (ref 0.3–1.2)
Total Protein: 8.1 g/dL (ref 6.5–8.1)

## 2015-01-21 LAB — LACTIC ACID, PLASMA: LACTIC ACID, VENOUS: 2.2 mmol/L — AB (ref 0.5–2.0)

## 2015-01-21 MED ORDER — ACETAMINOPHEN 650 MG RE SUPP
975.0000 mg | Freq: Once | RECTAL | Status: AC
  Administered 2015-01-21: 975 mg via RECTAL

## 2015-01-21 MED ORDER — SODIUM CHLORIDE 0.9 % IV BOLUS (SEPSIS)
1000.0000 mL | INTRAVENOUS | Status: AC
  Administered 2015-01-21 (×3): 1000 mL via INTRAVENOUS

## 2015-01-21 MED ORDER — ASPIRIN 300 MG RE SUPP
300.0000 mg | Freq: Once | RECTAL | Status: AC
  Administered 2015-01-22: 300 mg via RECTAL
  Filled 2015-01-21 (×2): qty 1

## 2015-01-21 MED ORDER — ACETAMINOPHEN 325 MG RE SUPP
RECTAL | Status: AC
  Filled 2015-01-21: qty 3

## 2015-01-21 MED ORDER — VANCOMYCIN HCL IN DEXTROSE 1-5 GM/200ML-% IV SOLN
1000.0000 mg | Freq: Once | INTRAVENOUS | Status: AC
  Administered 2015-01-21: 1000 mg via INTRAVENOUS
  Filled 2015-01-21: qty 200

## 2015-01-21 MED ORDER — PIPERACILLIN-TAZOBACTAM 3.375 G IVPB 30 MIN
3.3750 g | Freq: Once | INTRAVENOUS | Status: AC
  Administered 2015-01-21: 3.375 g via INTRAVENOUS
  Filled 2015-01-21: qty 50

## 2015-01-21 NOTE — Progress Notes (Addendum)
ANTIBIOTIC CONSULT NOTE - INITIAL  Pharmacy Consult for vancomycin/zosyn Indication: rule out sepsis  Allergies not on file  Patient Measurements: Height:  (167.6 cm) Weight: 194 lb 7.1 oz (88.2 kg) IBW/kg (Calculated) : 59.3 Adjusted Body Weight: 70.8 kg  Vital Signs: Temp: 103.2 F (39.6 C) (09/18 2217) Temp Source: Oral (09/18 2217) BP: 132/100 mmHg (09/18 2252) Pulse Rate: 115 (09/18 2252) Intake/Output from previous day:   Intake/Output from this shift:    Labs:  Recent Labs  01/21/15 2227  WBC 12.4*  HGB 13.3  PLT 154  CREATININE 2.11*   Estimated Creatinine Clearance: 32.5 mL/min (by C-G formula based on Cr of 2.11). No results for input(s): VANCOTROUGH, VANCOPEAK, VANCORANDOM, GENTTROUGH, GENTPEAK, GENTRANDOM, TOBRATROUGH, TOBRAPEAK, TOBRARND, AMIKACINPEAK, AMIKACINTROU, AMIKACIN in the last 72 hours.   Microbiology: No results found for this or any previous visit (from the past 720 hour(s)).  Medical History: No past medical history on file.  Medications:  Infusions:  . sodium chloride 1,000 mL (01/21/15 2247)  . vancomycin 1,000 mg (01/21/15 2247)   Assessment: 59 yof starting broad spectrum antibiotics for sepsis.  Vd 49.5 L, Ke 0.031 hr-1, T1/2 22.1 hr, predicted trough 18 mcg/mL.   Goal of Therapy:  Vancomycin trough level 15-20 mcg/ml  Plan:  Expected duration 7 days with resolution of temperature and/or normalization of WBC. Zosyn 3.375 gm IV Q8H EI and vancomycin 1 gm IV Q24H with stacked dosing second dose 10 hours after first.  Carola Frost, Pharm.D. Clinical Pharmacist 01/21/2015,11:21 PM

## 2015-01-21 NOTE — ED Provider Notes (Signed)
[facility] Emergency Department Provider Note   ____________________________________________  Time seen: On EMS arrival  I have reviewed the triage vital signs and the nursing notes.   HISTORY  Chief Complaint Altered Mental Status and Shortness of Breath   History limited by: Altered Mental Status   HPI Alexis Ray is a 59 y.o. female who presented to the emergency department today via EMS. EMS stated they were called to the residence today because of shortness of breath however when they got to the residence they noted that patient was also somewhat confused. Additionally the patient was diaphoretic. The patient was noted to be tachycardic by EMS. Blood pressure was okay. The patient initial O2 sats were in the low 90s. They did place her on 2 L oxygen. The patient herself is unable to give any history.   No past medical history on file.  There are no active problems to display for this patient.   No past surgical history on file.  No current outpatient prescriptions on file.  Allergies Review of patient's allergies indicates not on file.  No family history on file.  Social History Social History  Substance Use Topics  . Smoking status: Not on file  . Smokeless tobacco: Not on file  . Alcohol Use: Not on file    Review of Systems Unable to obtain secondary to altered mental status ____________________________________________   PHYSICAL EXAM:  VITAL SIGNS:  103.2 F (39.6 C)   126   36  127/67 mmHg  92 %     Constitutional: slightly diaphoretic, awake and alert, disoriented. Eyes: Conjunctivae are normal. PERRL. Normal extraocular movements. ENT   Head: Normocephalic and atraumatic.   Nose: No congestion/rhinnorhea.   Mouth/Throat: Mucous membranes are moist.   Neck: No stridor. Hematological/Lymphatic/Immunilogical: No cervical lymphadenopathy. Cardiovascular: tachycardic, regular rhythm.  No murmurs, rubs, or  gallops. Respiratory: Normal respiratory effort without tachypnea nor retractions. Breath sounds are clear and equal bilaterally. Mild wheezing appreciated anteriorly Gastrointestinal: Soft and nontender. No distention.  Genitourinary: Deferred Musculoskeletal: Normal range of motion in all extremities. No joint effusions.  No lower extremity tenderness nor edema. Neurologic:  Disoriented to place, events, time Skin:  Skin is warm, diaphoretic   ____________________________________________    LABS (pertinent positives/negatives)  Lactic Acid 2.2 WBC 12.4 Trop 0.87 ____________________________________________   EKG  I, Phineas Semen, attending physician, personally viewed and interpreted this EKG  EKG Time: 2220 Rate: 126 Rhythm: sinus tachycardia Axis: normal Intervals: qtc 477 QRS: narrow ST changes: no st elevation Impression: abnormal ekg   ____________________________________________    RADIOLOGY  CXR IMPRESSION: Shallow inspiration with atelectasis in the lung bases. Possible focal infiltration in the left lung base.  ____________________________________________   PROCEDURES  Procedure(s) performed: None  Critical Care performed: Yes, see critical care note(s)  CRITICAL CARE Performed by: Phineas Semen   Total critical care time: 40  Critical care time was exclusive of separately billable procedures and treating other patients.  Critical care was necessary to treat or prevent imminent or life-threatening deterioration.  Critical care was time spent personally by me on the following activities: development of treatment plan with patient and/or surrogate as well as nursing, discussions with consultants, evaluation of patient's response to treatment, examination of patient, obtaining history from patient or surrogate, ordering and performing treatments and interventions, ordering and review of laboratory studies, ordering and review of  radiographic studies, pulse oximetry and re-evaluation of patient's condition.  ____________________________________________   INITIAL IMPRESSION / ASSESSMENT AND  PLAN / ED COURSE  Pertinent labs & imaging results that were available during my care of the patient were reviewed by me and considered in my medical decision making (see chart for details).  Patient presented to the emergency today with concerns for shortness breath and altered mental status. On arrival patient tachycardic, tachypnea And febrile. Code sepsis was called. Patient was given broad-spectrum antibiotics and fluids. Patient did start to have some improvement in her mental status. Unfortunately [facility] did not have any stepdown or ICU beds available thus the patient was set up to be transferred to an outside facility.  ____________________________________________   FINAL CLINICAL IMPRESSION(S) / ED DIAGNOSES  Final diagnoses:  Sepsis, due to unspecified organism  Tachycardia  Elevated troponin     Phineas Semen, MD 01/24/15 [phone]

## 2015-01-22 LAB — LACTIC ACID, PLASMA: LACTIC ACID, VENOUS: 1.4 mmol/L (ref 0.5–2.0)

## 2015-01-22 MED ORDER — SODIUM CHLORIDE 0.9 % IV BOLUS (SEPSIS)
1000.0000 mL | Freq: Once | INTRAVENOUS | Status: AC
Start: 2015-01-22 — End: 2015-01-22
  Administered 2015-01-22: 1000 mL via INTRAVENOUS

## 2015-01-22 MED ORDER — IPRATROPIUM-ALBUTEROL 0.5-2.5 (3) MG/3ML IN SOLN
3.0000 mL | Freq: Once | RESPIRATORY_TRACT | Status: AC
  Administered 2015-01-22: 3 mL via RESPIRATORY_TRACT
  Filled 2015-01-22: qty 3

## 2015-01-22 NOTE — ED Notes (Signed)
Dr Derrill Kay update with critical labs

## 2015-01-22 NOTE — ED Notes (Signed)
Respiratory called for ABG, D otw

## 2015-01-22 NOTE — ED Notes (Signed)
Final liter ( ) of NS started now

## 2015-01-22 NOTE — ED Notes (Signed)
MD at bedside. 

## 2015-01-22 NOTE — ED Provider Notes (Signed)
-----------------------------------------   2:24 AM on 01/22/2015 -----------------------------------------   Blood pressure 117/77, pulse 97, temperature 99.7 F (37.6 C), temperature source Rectal, resp. rate 28, height _0  (1.676 m), weight 194 lb 7.1 oz (88.2 kg), SpO2 100 %.  Assuming care from Dr. Archie Balboa.  In short, Alexis Ray is a 59 y.o. female with a chief complaint of Altered Mental Status and Shortness of Breath .  Refer to the original H&P for additional details.  The current plan of care is to transfer the patient to an outside facility.  Dr. Archie Balboa initially evaluated this patient and was concerned that the patient had sepsis. At this time we do not have any stepdown or intensive care unit beds available so he would like the patient transferred for higher level of care. Dr. Archie Balboa had previously spoken to [facility] and [facility] with the goal of getting the patient transferred. At the time of his sign out Dr. Archie Balboa was awaiting a return phone call from [facility] accepting the patient.  I received a phone call from Dr. Shelly Flatten who was concerned about the patient's tachypnea and her ability to safely be transferred. She asked me to perform an ABG, reevaluate the patient's respiratory status and evaluate the patient should need a lumbar puncture.  I did perform an ABG which showed a normal pH, a PCO2 of 30 and a PO2 of 80. The patient also had a repeat lactic acid which was 1.4. The patient did have some wheezing on exam so I did give HER-2 duo nebs and her breathing improved. The patient's tachypnea improved to the high teens low 20s. I recontacted Dr. Shelly Flatten and informed her that the patient's altered mental status was also improving. The patient will be transferred to [facility] for a higher level of care.   Loney Hering, MD 01/22/15 [phone]

## 2015-01-22 NOTE — ED Notes (Signed)
Pt defecated in the bed, pt cleaned and put into diaper

## 2015-01-22 NOTE — ED Notes (Signed)
Report given to [place], [facility]

## 2015-01-23 LAB — BLOOD GAS, ARTERIAL
Acid-base deficit: 3.3 mmol/L — ABNORMAL HIGH (ref 0.0–2.0)
Allens test (pass/fail): POSITIVE — AB
BICARBONATE: 19.9 meq/L — AB (ref 21.0–28.0)
FIO2: 0.28
O2 SAT: 96.1 %
PATIENT TEMPERATURE: 37
PO2 ART: 80 mmHg — AB (ref 83.0–108.0)
pCO2 arterial: 30 mmHg — ABNORMAL LOW (ref 32.0–48.0)
pH, Arterial: 7.43 (ref 7.350–7.450)

## 2015-01-23 LAB — URINE CULTURE: CULTURE: NO GROWTH

## 2015-01-25 ENCOUNTER — Telehealth: Payer: Self-pay | Admitting: Emergency Medicine

## 2015-01-25 NOTE — ED Notes (Signed)
Patient was transferred to [facility]. Received positive blood culture report from lab.  patietn is no longer at [facility].

## 2015-01-29 LAB — CULTURE, BLOOD (ROUTINE X 2): CULTURE: NO GROWTH

## 2015-03-14 LAB — CULTURE, BLOOD (ROUTINE X 2)

## 2015-05-01 ENCOUNTER — Emergency Department
Admission: EM | Admit: 2015-05-01 | Discharge: 2015-05-01 | Disposition: A | Discharge status: Home / Self Care | Payer: Medicare HMO | Attending: Emergency Medicine | Admitting: Emergency Medicine

## 2015-05-01 ENCOUNTER — Emergency Department: Payer: Medicare HMO

## 2015-05-01 ENCOUNTER — Encounter: Payer: Self-pay | Admitting: Emergency Medicine

## 2015-05-01 DIAGNOSIS — R05 Cough: Secondary | ICD-10-CM | POA: Diagnosis present

## 2015-05-01 DIAGNOSIS — I10 Essential (primary) hypertension: Secondary | ICD-10-CM | POA: Diagnosis not present

## 2015-05-01 DIAGNOSIS — Z7982 Long term (current) use of aspirin: Secondary | ICD-10-CM | POA: Insufficient documentation

## 2015-05-01 DIAGNOSIS — J810 Acute pulmonary edema: Secondary | ICD-10-CM | POA: Insufficient documentation

## 2015-05-01 DIAGNOSIS — Z7951 Long term (current) use of inhaled steroids: Secondary | ICD-10-CM | POA: Insufficient documentation

## 2015-05-01 DIAGNOSIS — F17200 Nicotine dependence, unspecified, uncomplicated: Secondary | ICD-10-CM | POA: Diagnosis not present

## 2015-05-01 DIAGNOSIS — Z794 Long term (current) use of insulin: Secondary | ICD-10-CM | POA: Insufficient documentation

## 2015-05-01 DIAGNOSIS — E119 Type 2 diabetes mellitus without complications: Secondary | ICD-10-CM | POA: Insufficient documentation

## 2015-05-01 DIAGNOSIS — J811 Chronic pulmonary edema: Secondary | ICD-10-CM | POA: Insufficient documentation

## 2015-05-01 DIAGNOSIS — Z79899 Other long term (current) drug therapy: Secondary | ICD-10-CM | POA: Insufficient documentation

## 2015-05-01 DIAGNOSIS — Z7984 Long term (current) use of oral hypoglycemic drugs: Secondary | ICD-10-CM | POA: Diagnosis not present

## 2015-05-01 HISTORY — DX: Type 2 diabetes mellitus without complications: E11.9

## 2015-05-01 HISTORY — DX: Essential (primary) hypertension: I10

## 2015-05-01 LAB — CBC WITH DIFFERENTIAL/PLATELET
BASOS ABS: 0 10*3/uL (ref 0–0.1)
BASOS PCT: 1 %
EOS ABS: 0 10*3/uL (ref 0–0.7)
EOS PCT: 0 %
HCT: 35.9 % (ref 35.0–47.0)
Hemoglobin: 11.4 g/dL — ABNORMAL LOW (ref 12.0–16.0)
LYMPHS ABS: 1 10*3/uL (ref 1.0–3.6)
Lymphocytes Relative: 14 %
MCH: 23.4 pg — AB (ref 26.0–34.0)
MCHC: 31.7 g/dL — ABNORMAL LOW (ref 32.0–36.0)
MCV: 73.8 fL — ABNORMAL LOW (ref 80.0–100.0)
Monocytes Absolute: 0.4 10*3/uL (ref 0.2–0.9)
Monocytes Relative: 6 %
Neutro Abs: 5.9 10*3/uL (ref 1.4–6.5)
Neutrophils Relative %: 79 %
PLATELETS: 180 10*3/uL (ref 150–440)
RBC: 4.87 MIL/uL (ref 3.80–5.20)
RDW: 18.2 % — ABNORMAL HIGH (ref 11.5–14.5)
WBC: 7.5 10*3/uL (ref 3.6–11.0)

## 2015-05-01 LAB — COMPREHENSIVE METABOLIC PANEL
ALBUMIN: 3.5 g/dL (ref 3.5–5.0)
ALT: 14 U/L (ref 14–54)
AST: 17 U/L (ref 15–41)
Alkaline Phosphatase: 47 U/L (ref 38–126)
Anion gap: 8 (ref 5–15)
BUN: 16 mg/dL (ref 6–20)
CHLORIDE: 102 mmol/L (ref 101–111)
CO2: 30 mmol/L (ref 22–32)
CREATININE: 1.18 mg/dL — AB (ref 0.44–1.00)
Calcium: 9.3 mg/dL (ref 8.9–10.3)
GFR calc non Af Amer: 50 mL/min — ABNORMAL LOW (ref >60)
GFR, EST AFRICAN AMERICAN: 58 mL/min — AB (ref >60)
Glucose, Bld: 175 mg/dL — ABNORMAL HIGH (ref 65–99)
Potassium: 4.2 mmol/L (ref 3.5–5.1)
SODIUM: 140 mmol/L (ref 135–145)
Total Bilirubin: 0.7 mg/dL (ref 0.3–1.2)
Total Protein: 7.4 g/dL (ref 6.5–8.1)

## 2015-05-01 LAB — BRAIN NATRIURETIC PEPTIDE: B NATRIURETIC PEPTIDE 5: 295 pg/mL — AB (ref 0.0–100.0)

## 2015-05-01 LAB — TROPONIN I: Troponin I: 0.06 ng/mL — ABNORMAL HIGH (ref <0.031)

## 2015-05-01 MED ORDER — FUROSEMIDE 40 MG PO TABS: 40.0000 mg | ORAL_TABLET | Freq: Two times a day (BID) | ORAL | Status: DC

## 2015-05-01 NOTE — Discharge Instructions (Signed)
Pulmonary Edema °Pulmonary edema (PE) is a condition in which fluid collects in the lungs. This makes it hard to breathe. PE may be a result of the heart not pumping very well or a result of injury.  °CAUSES  °· Coronary artery disease causes blockages in the arteries of the heart. This deprives the heart muscle of oxygen and weakens the muscle. A heart attack is a form of coronary artery disease. °· High blood pressure causes the heart muscle to work harder than usual. Over time, the heart muscle may get stiff, and it starts to work less efficiently. It may also fatigue and weaken. °· Viral infection of the heart (myocarditis) may weaken the heart muscle. °· Metabolic conditions such as thyroid disease, excessive alcohol use, certain vitamin deficiencies, or diabetes may also weaken the heart muscle. °· Leaky or stiff heart valves may impair normal heart function. °· Lung disease may strain the heart muscle. °· Excessive demands on the heart such as too much salt or fluid intake. °· Failure to take prescribed medicines. °· Lung injury from heat or toxins, such as poisonous gas. °· Infection in the lungs or other parts of the body. °· Fluid overload caused by kidney failure or medicines. °SYMPTOMS  °· Shortness of breath at rest or with exertion. °· Grunting, wheezing, or gurgling while breathing. °· Feeling like you cannot get enough air. °· Breaths are shallow and fast. °· A lot of coughing with frothy or bloody mucus. °· Skin may become cool, damp, and turn a pale or bluish color. °DIAGNOSIS  °Initial diagnosis may be based on your history, symptoms, and a physical examination. Additional tests for PE may include: °· Electrocardiography. °· Chest X-ray. °· Blood tests. °· Stress test. °· Ultrasound evaluation of the heart (echocardiography). °· Evaluation by a heart doctor (cardiologist). °· Test of the heart arteries to look for blockages (angiography). °· Check of blood oxygen. °TREATMENT  °Treatment of PE will  depend on the underlying cause and will focus first on relieving the symptoms.  °· Extra oxygen to make breathing easier and assist with removing mucus. This may include breathing treatments or a tube into the lungs and a breathing machine. °· Medicine to help the body get rid of extra water, usually through an IV tube. °· Medicine to help the heart pump better. °· If poor heart function is the cause, treatment may include: °¨ Procedures to open blocked arteries, repair damaged heart valves, or remove some of the damaged heart muscle. °¨ A pacemaker to help the heart pump with less effort. °HOME CARE INSTRUCTIONS  °· Your health care provider will help you determine what type of exercise program may be helpful. It is important to maintain strength and increase it if possible. Pace your activities to avoid shortness of breath or chest pain. Rest for at least 1 hour before and after meals. Cardiac rehabilitation programs are available in some locations. °· Eat a heart-healthy diet low in salt, saturated fat, and cholesterol. Ask for help with choices. °· Make a list of every medicine, vitamin, or herbal supplement you are taking. Keep the list with you at all times. Show it to your health care provider at every visit and before starting a new medicine. Keep the list up to date. °· Ask your health care provider or pharmacist to help you write a plan or schedule so that you know things about each medicine such as: °¨ Why you are taking it. °¨ The possible side effects. °¨   The best time of day to take it. °¨ Foods to take with it or avoid. °¨ When to stop taking it. °· Record your hospital or clinic weight. When you get home, compare it to your scale and record your weight. Then, weigh yourself first thing in the morning daily, and record the weights. You should weigh yourself every morning after you urinate and before you eat breakfast. Wear the same amount of clothing each time you weigh yourself. Provide your health  care provider with your weight record. Daily weights are important in the early recognition of excess fluid. Tell your health care provider right away if you have gained 03 lb/1.4 kg in 1 day, 05 lb/2.3 kg in a week, or as directed by your health care provider. Your medicines may need to be adjusted. °· Blood pressure monitoring should be done as often as directed. You can get a home blood pressure cuff at your drugstore. Record these values and bring them with you for your clinic visits. Notify your health care provider if you become dizzy or light-headed when standing up. °· If you are currently a smoker, it is time to quit. Nicotine makes your heart work harder and is one of the leading causes of cardiac deaths. Do not use nicotine gum or patches before talking to your doctor. °· Make a follow-up appointment with your health care provider as directed. °· Ask your health care provider for a copy of your latest heart tracing (ECG) and keep a copy with you at all times. °SEEK IMMEDIATE MEDICAL CARE IF:  °· You have severe chest pain, especially if the pain is crushing or pressure-like and spreads to the arms, back, neck, or jaw. THIS IS AN EMERGENCY. Do not wait to see if the pain will go away. Call for local emergency medical help. Do not drive yourself to the hospital. °· You have sweating, feel sick to your stomach (nauseous), or are experiencing shortness of breath. °· Your weight increases by 03 lb/1.4 kg in 1 day or 05 lb/2.3 kg in a week. °· You notice increasing shortness of breath that is unusual for you. This may happen during rest, sleep, or with activity. °· You develop chest pain (angina) or pain that is unusual for you. °· You notice more swelling in your hands, feet, ankles, or abdomen. °· You notice lasting (persistent) dizziness, blurred vision, headache, or unsteadiness. °· You begin to cough up bloody mucus (sputum). °· You are unable to sleep because it is hard to breathe. °· You begin to feel a  "jumping" or "fluttering" sensation (palpitations) in the chest that is unusual for you. °MAKE SURE YOU: °· Understand these instructions. °· Will watch your condition. °· Will get help right away if you are not doing well or get worse. °  °This information is not intended to replace advice given to you by your health care provider. Make sure you discuss any questions you have with your health care provider. °  °Document Released: 07/12/2002 Document Revised: 04/26/2013 Document Reviewed: 12/27/2012 °Elsevier Interactive Patient Education ©2016 [employer] ° °Please return immediately if condition worsens. Please contact her primary physician or the physician you were given for referral. If you have any specialist physicians involved in her treatment and plan please also contact them. Thank you for using [facility]. ° °

## 2015-05-01 NOTE — ED Provider Notes (Signed)
Time Seen: Approximately  ----------------------------------------- 4:07 PM on 05/01/2015 -----------------------------------------    I have reviewed the triage notes  Chief Complaint: Cough   History of Present Illness: Alexis Ray is a 59 y.o. female who presents with a persistent cough since approximately 12/23. She states she has some shortness of breath especially with lying flat. Cough is only occasionally productive with clear sputum. She is not aware of any fever at home but denies any chest discomfort unless she coughs. She denies any arm or jaw pain. As any nausea, vomiting, or diaphoresis. Patient has a history congestive heart failure is currently on 40 mg of Lasix per day. Patient does continue to smoke approximately half a pack of cigarettes per day. She states her physicians are mainly at [facility].   Past Medical History  Diagnosis Date  . Diabetes mellitus without complication (HCC)   . Hypertension   . CHF (congestive heart failure) (HCC)     There are no active problems to display for this patient.   History reviewed. No pertinent past surgical history.  History reviewed. No pertinent past surgical history.  Current Outpatient Rx  Name  Route  Sig  Dispense  Refill  . aspirin EC 81 MG tablet   Oral   Take 81 mg by mouth daily.         . budesonide-formoterol (SYMBICORT) 160-4.5 MCG/ACT inhaler   Inhalation   Inhale 2 puffs into the lungs 2 (two) times daily.         . carvedilol (COREG) 25 MG tablet   Oral   Take 12.5 mg by mouth 2 (two) times daily with a meal.         . Cholecalciferol (D3-1000) 1000 UNITS tablet   Oral   Take 1,000 Units by mouth daily.         . cilostazol (PLETAL) 100 MG tablet   Oral   Take 100 mg by mouth 2 (two) times daily.         Marland Kitchen esomeprazole (NEXIUM) 40 MG capsule   Oral   Take 40 mg by mouth daily at 12 noon.         . furosemide (LASIX) 40 MG tablet   Oral   Take 80 mg by mouth 2 (two)  times daily.         . insulin aspart (NOVOLOG FLEXPEN) 100 UNIT/ML FlexPen   Subcutaneous   Inject 10 Units into the skin 3 (three) times daily before meals.         Marland Kitchen levocetirizine (XYZAL) 5 MG tablet   Oral   Take 5 mg by mouth every evening.         Marland Kitchen EXPIRED: Liraglutide (VICTOZA) 18 MG/3ML SOPN   Subcutaneous   Inject 1.8 mg into the skin daily.         . metFORMIN (GLUCOPHAGE) 1000 MG tablet   Oral   Take 1,000 mg by mouth 2 (two) times daily with a meal.         . Potassium Chloride ER 20 MEQ TBCR   Oral   Take 1 tablet by mouth 2 (two) times daily.         . pregabalin (LYRICA) 150 MG capsule   Oral   Take 150 mg by mouth 2 (two) times daily.         . rosuvastatin (CRESTOR) 40 MG tablet   Oral   Take 40 mg by mouth daily.         Marland Kitchen  spironolactone (ALDACTONE) 50 MG tablet   Oral   Take 50 mg by mouth daily.         . valsartan (DIOVAN) 40 MG tablet   Oral   Take 40 mg by mouth 2 (two) times daily.         . Venlafaxine HCl 225 MG TB24   Oral   Take 1 tablet by mouth daily.         . vitamin B-12 (CYANOCOBALAMIN) 1000 MCG tablet   Oral   Take 1,000 mcg by mouth daily.           Allergies:  Review of patient's allergies indicates no known allergies.  Family History: No family history on file.  Social History: Social History  Substance Use Topics  . Smoking status: Current Every Day Smoker  . Smokeless tobacco: None  . Alcohol Use: No     Review of Systems:   10 point review of systems was performed and was otherwise negative:  Constitutional: No fever Eyes: No visual disturbances ENT: No sore throat, ear pain Cardiac: No chest pain Respiratory: Shortness of breath mainly with lying flat especially at night Abdomen: No abdominal pain, no vomiting, No diarrhea Endocrine: No weight loss, No night sweats Extremities: No peripheral edema, cyanosis Skin: No rashes, easy bruising Neurologic: No focal weakness,  trouble with speech or swollowing Urologic: No dysuria, Hematuria, or urinary frequency   Physical Exam:  ED Triage Vitals  Enc Vitals Group     BP 05/01/15 1414 139/75 mmHg     Pulse Rate 05/01/15 1414 88     Resp 05/01/15 1414 20     Temp 05/01/15 1414 97.7 F (36.5 C)     Temp src --      SpO2 05/01/15 1414 95 %     Weight 05/01/15 1414 192 lb (87.091 kg)     Height 05/01/15 1414  (1.676 m)     Head Cir --      Peak Flow --      Pain Score 05/01/15 1415 4     Pain Loc --      Pain Edu? --      Excl. in GC? --     General: Awake , Alert , and Oriented times 3; GCS 15 Head: Normal cephalic , atraumatic Eyes: Pupils equal , round, reactive to light Nose/Throat: No nasal drainage, patent upper airway without erythema or exudate.  Neck: Supple, Full range of motion, No anterior adenopathy or palpable thyroid masses Lungs: Mild rales auscultated at the bases without any wheezes or rhonchi noted. Heart: Regular rate, regular rhythm without murmurs , gallops , or rubs Abdomen: Soft, non tender without rebound, guarding , or rigidity; bowel sounds positive and symmetric in all 4 quadrants. No organomegaly .        Extremities: 2 plus symmetric pulses. No edema, clubbing or cyanosis Neurologic: normal ambulation, Motor symmetric without deficits, sensory intact Skin: warm, dry, no rashes   Labs:   All laboratory work was reviewed including any pertinent negatives or positives listed below:  Labs Reviewed  CBC WITH DIFFERENTIAL/PLATELET - Abnormal; Notable for the following:    Hemoglobin 11.4 (*)    MCV 73.8 (*)    MCH 23.4 (*)    MCHC 31.7 (*)    RDW 18.2 (*)    All other components within normal limits  COMPREHENSIVE METABOLIC PANEL - Abnormal; Notable for the following:    Glucose, Bld 175 (*)    Creatinine,  Ser 1.18 (*)    GFR calc non Af Amer 50 (*)    GFR calc Af Amer 58 (*)    All other components within normal limits  TROPONIN I - Abnormal; Notable for the  following:    Troponin I 0.06 (*)    All other components within normal limits  BRAIN NATRIURETIC PEPTIDE    EKG:  ED ECG REPORT I, Jennye MoccasinBrian S Princess Karnes, the attending physician, personally viewed and interpreted this ECG.  Date: 05/01/2015 EKG Time: 1422 Rate: 88 Rhythm: normal sinus rhythm QRS Axis: normal Intervals: normal ST/T Wave abnormalities: normal Conduction Disutrbances: none Narrative Interpretation: unremarkable Poor R-wave progression in the anterior leads with no ischemic changes   Radiology:     Narrative:    CLINICAL DATA: Shortness of breath, cough for 2 days  EXAM: CHEST 2 VIEW  COMPARISON: 01/21/2015  FINDINGS: There is bilateral diffuse interstitial thickening. There is no focal consolidation. There is no pleural effusion or pneumothorax. There is stable cardiomegaly.  The osseous structures are unremarkable.  IMPRESSION: Cardiomegaly with pulmonary vascular congestion.   Electronically Signed By: Elige KoHetal Patel     I personally reviewed the radiologic studies    ED Course:  The patient's chest pain seems to be exclusively with coughing and reexamination shows some reproducible component of the right upper chest wall region. I felt her troponin was slightly elevated due to some mild renal insufficiency, pulmonary edema, etc. Her EKG does not show any acute ischemic changes. I felt this was unlikely to be a significant elevation of her troponin at this time. Review of her previous records shows troponins much more elevated at 0. 87. The patient's appears to have some mild pulmonary edema and I felt there was unlikely to be an infectious cause with the patient being afebrile and normal white blood cell count and her cough being nonproductive. She will be increased on her Lasix to 40 mg twice a day she's been advised continue all of her current medications and touch base with her primary physician for reassessment.   Assessment:   acute  exacerbation of chronic pulmonary edema   Plan: * Outpatient management Patient was advised to return immediately if condition worsens. Patient was advised to follow up with their primary care physician or other specialized physicians involved in their outpatient care            Jennye MoccasinBrian S Shardee Dieu, MD 05/01/15 1702

## 2015-05-01 NOTE — ED Notes (Signed)
States she developed chest congestion and cough about 3-4 days    Unsure of fever . Occasional prod cough

## 2015-05-23 ENCOUNTER — Emergency Department: Payer: Medicare HMO

## 2015-05-23 ENCOUNTER — Encounter: Payer: Self-pay | Admitting: Emergency Medicine

## 2015-05-23 ENCOUNTER — Inpatient Hospital Stay
Admission: EM | Admit: 2015-05-23 | Discharge: 2015-05-26 | DRG: 292 | Disposition: A | Discharge status: Home Health | Payer: Medicare HMO | Attending: Internal Medicine | Admitting: Internal Medicine

## 2015-05-23 DIAGNOSIS — Z7984 Long term (current) use of oral hypoglycemic drugs: Secondary | ICD-10-CM | POA: Diagnosis not present

## 2015-05-23 DIAGNOSIS — G4733 Obstructive sleep apnea (adult) (pediatric): Secondary | ICD-10-CM | POA: Diagnosis present

## 2015-05-23 DIAGNOSIS — I110 Hypertensive heart disease with heart failure: Principal | ICD-10-CM | POA: Diagnosis present

## 2015-05-23 DIAGNOSIS — I700 Atherosclerosis of aorta: Secondary | ICD-10-CM | POA: Diagnosis present

## 2015-05-23 DIAGNOSIS — E119 Type 2 diabetes mellitus without complications: Secondary | ICD-10-CM | POA: Diagnosis present

## 2015-05-23 DIAGNOSIS — F17210 Nicotine dependence, cigarettes, uncomplicated: Secondary | ICD-10-CM | POA: Diagnosis present

## 2015-05-23 DIAGNOSIS — Z8673 Personal history of transient ischemic attack (TIA), and cerebral infarction without residual deficits: Secondary | ICD-10-CM | POA: Diagnosis not present

## 2015-05-23 DIAGNOSIS — I25110 Atherosclerotic heart disease of native coronary artery with unstable angina pectoris: Secondary | ICD-10-CM | POA: Diagnosis present

## 2015-05-23 DIAGNOSIS — I509 Heart failure, unspecified: Secondary | ICD-10-CM

## 2015-05-23 DIAGNOSIS — Z7982 Long term (current) use of aspirin: Secondary | ICD-10-CM

## 2015-05-23 DIAGNOSIS — Z7951 Long term (current) use of inhaled steroids: Secondary | ICD-10-CM

## 2015-05-23 DIAGNOSIS — I5043 Acute on chronic combined systolic (congestive) and diastolic (congestive) heart failure: Secondary | ICD-10-CM | POA: Diagnosis present

## 2015-05-23 DIAGNOSIS — Z79899 Other long term (current) drug therapy: Secondary | ICD-10-CM

## 2015-05-23 DIAGNOSIS — K219 Gastro-esophageal reflux disease without esophagitis: Secondary | ICD-10-CM | POA: Diagnosis present

## 2015-05-23 DIAGNOSIS — I739 Peripheral vascular disease, unspecified: Secondary | ICD-10-CM | POA: Diagnosis present

## 2015-05-23 DIAGNOSIS — Z9989 Dependence on other enabling machines and devices: Secondary | ICD-10-CM

## 2015-05-23 DIAGNOSIS — R0602 Shortness of breath: Secondary | ICD-10-CM | POA: Diagnosis present

## 2015-05-23 DIAGNOSIS — Z794 Long term (current) use of insulin: Secondary | ICD-10-CM | POA: Diagnosis not present

## 2015-05-23 DIAGNOSIS — J441 Chronic obstructive pulmonary disease with (acute) exacerbation: Secondary | ICD-10-CM | POA: Diagnosis present

## 2015-05-23 DIAGNOSIS — I10 Essential (primary) hypertension: Secondary | ICD-10-CM | POA: Diagnosis present

## 2015-05-23 DIAGNOSIS — E785 Hyperlipidemia, unspecified: Secondary | ICD-10-CM | POA: Diagnosis present

## 2015-05-23 DIAGNOSIS — J449 Chronic obstructive pulmonary disease, unspecified: Secondary | ICD-10-CM

## 2015-05-23 HISTORY — DX: Atherosclerotic heart disease of native coronary artery without angina pectoris: I25.10

## 2015-05-23 HISTORY — DX: Chronic obstructive pulmonary disease, unspecified: J44.9

## 2015-05-23 HISTORY — DX: Gastro-esophageal reflux disease without esophagitis: K21.9

## 2015-05-23 HISTORY — DX: Peripheral vascular disease, unspecified: I73.9

## 2015-05-23 HISTORY — DX: Cerebral infarction, unspecified: I63.9

## 2015-05-23 HISTORY — DX: Chronic combined systolic (congestive) and diastolic (congestive) heart failure: I50.42

## 2015-05-23 LAB — TROPONIN I: Troponin I: 0.09 ng/mL — ABNORMAL HIGH (ref <0.031)

## 2015-05-23 LAB — CBC
HEMATOCRIT: 36.4 % (ref 35.0–47.0)
Hemoglobin: 11.5 g/dL — ABNORMAL LOW (ref 12.0–16.0)
MCH: 22.9 pg — AB (ref 26.0–34.0)
MCHC: 31.7 g/dL — ABNORMAL LOW (ref 32.0–36.0)
MCV: 72.2 fL — AB (ref 80.0–100.0)
PLATELETS: 200 10*3/uL (ref 150–440)
RBC: 5.04 MIL/uL (ref 3.80–5.20)
RDW: 18.6 % — AB (ref 11.5–14.5)
WBC: 8.3 10*3/uL (ref 3.6–11.0)

## 2015-05-23 LAB — GLUCOSE, CAPILLARY
GLUCOSE-CAPILLARY: 68 mg/dL (ref 65–99)
GLUCOSE-CAPILLARY: 87 mg/dL (ref 65–99)

## 2015-05-23 LAB — BASIC METABOLIC PANEL
Anion gap: 9 (ref 5–15)
BUN: 15 mg/dL (ref 6–20)
CHLORIDE: 103 mmol/L (ref 101–111)
CO2: 27 mmol/L (ref 22–32)
CREATININE: 1.13 mg/dL — AB (ref 0.44–1.00)
Calcium: 8.9 mg/dL (ref 8.9–10.3)
GFR calc Af Amer: >60 mL/min (ref >60)
GFR calc non Af Amer: 53 mL/min — ABNORMAL LOW (ref >60)
GLUCOSE: 97 mg/dL (ref 65–99)
POTASSIUM: 3.7 mmol/L (ref 3.5–5.1)
Sodium: 139 mmol/L (ref 135–145)

## 2015-05-23 LAB — BRAIN NATRIURETIC PEPTIDE: B Natriuretic Peptide: 332 pg/mL — ABNORMAL HIGH (ref 0.0–100.0)

## 2015-05-23 MED ORDER — FUROSEMIDE 10 MG/ML IJ SOLN: 40.0000 mg | Freq: Once | INTRAMUSCULAR | Status: DC

## 2015-05-23 MED ORDER — ENOXAPARIN SODIUM 40 MG/0.4ML ~~LOC~~ SOLN
40.0000 mg | SUBCUTANEOUS | Status: DC
  Administered 2015-05-23 – 2015-05-25 (×3): 40 mg via SUBCUTANEOUS
  Filled 2015-05-23 (×3): qty 0.4

## 2015-05-23 MED ORDER — ONDANSETRON HCL 4 MG/2ML IJ SOLN: 4.0000 mg | Freq: Four times a day (QID) | INTRAMUSCULAR | Status: DC | PRN

## 2015-05-23 MED ORDER — METHYLPREDNISOLONE SODIUM SUCC 125 MG IJ SOLR
125.0000 mg | Freq: Once | INTRAMUSCULAR | Status: AC
  Administered 2015-05-23: 125 mg via INTRAVENOUS
  Filled 2015-05-23: qty 2

## 2015-05-23 MED ORDER — IPRATROPIUM-ALBUTEROL 0.5-2.5 (3) MG/3ML IN SOLN
3.0000 mL | RESPIRATORY_TRACT | Status: DC
  Administered 2015-05-23 – 2015-05-24 (×4): 3 mL via RESPIRATORY_TRACT
  Filled 2015-05-23 (×4): qty 3

## 2015-05-23 MED ORDER — FUROSEMIDE 40 MG PO TABS
40.0000 mg | ORAL_TABLET | Freq: Two times a day (BID) | ORAL | Status: DC
Start: 2015-05-24 — End: 2015-05-25
  Administered 2015-05-24 – 2015-05-25 (×3): 40 mg via ORAL
  Filled 2015-05-23 (×3): qty 1

## 2015-05-23 MED ORDER — CARVEDILOL 12.5 MG PO TABS
12.5000 mg | ORAL_TABLET | Freq: Two times a day (BID) | ORAL | Status: DC
  Administered 2015-05-24 – 2015-05-26 (×5): 12.5 mg via ORAL
  Filled 2015-05-23 (×5): qty 1

## 2015-05-23 MED ORDER — ACETAMINOPHEN 650 MG RE SUPP: 650.0000 mg | Freq: Four times a day (QID) | RECTAL | Status: DC | PRN

## 2015-05-23 MED ORDER — SODIUM CHLORIDE 0.9 % IJ SOLN
3.0000 mL | Freq: Two times a day (BID) | INTRAMUSCULAR | Status: DC
  Administered 2015-05-23 – 2015-05-26 (×6): 3 mL via INTRAVENOUS

## 2015-05-23 MED ORDER — SPIRONOLACTONE 25 MG PO TABS
50.0000 mg | ORAL_TABLET | Freq: Every day | ORAL | Status: DC
  Administered 2015-05-24 – 2015-05-26 (×3): 50 mg via ORAL
  Filled 2015-05-23 (×3): qty 2

## 2015-05-23 MED ORDER — TIOTROPIUM BROMIDE MONOHYDRATE 18 MCG IN CAPS
18.0000 ug | ORAL_CAPSULE | Freq: Every day | RESPIRATORY_TRACT | Status: DC
  Administered 2015-05-24 – 2015-05-26 (×3): 18 ug via RESPIRATORY_TRACT
  Filled 2015-05-23: qty 5

## 2015-05-23 MED ORDER — IPRATROPIUM-ALBUTEROL 0.5-2.5 (3) MG/3ML IN SOLN: 3.0000 mL | RESPIRATORY_TRACT | Status: DC | PRN

## 2015-05-23 MED ORDER — ONDANSETRON HCL 4 MG PO TABS: 4.0000 mg | ORAL_TABLET | Freq: Four times a day (QID) | ORAL | Status: DC | PRN

## 2015-05-23 MED ORDER — ASPIRIN EC 81 MG PO TBEC
81.0000 mg | DELAYED_RELEASE_TABLET | Freq: Every day | ORAL | Status: DC
  Administered 2015-05-24 – 2015-05-26 (×3): 81 mg via ORAL
  Filled 2015-05-23 (×3): qty 1

## 2015-05-23 MED ORDER — IPRATROPIUM-ALBUTEROL 0.5-2.5 (3) MG/3ML IN SOLN
3.0000 mL | Freq: Once | RESPIRATORY_TRACT | Status: AC
  Administered 2015-05-23: 3 mL via RESPIRATORY_TRACT
  Filled 2015-05-23: qty 3

## 2015-05-23 MED ORDER — BUDESONIDE-FORMOTEROL FUMARATE 160-4.5 MCG/ACT IN AERO
2.0000 | INHALATION_SPRAY | Freq: Two times a day (BID) | RESPIRATORY_TRACT | Status: DC
  Administered 2015-05-24 – 2015-05-26 (×5): 2 via RESPIRATORY_TRACT
  Filled 2015-05-23: qty 6

## 2015-05-23 MED ORDER — VENLAFAXINE HCL ER 75 MG PO CP24
225.0000 mg | ORAL_CAPSULE | Freq: Every day | ORAL | Status: DC
  Administered 2015-05-24 – 2015-05-26 (×3): 225 mg via ORAL
  Filled 2015-05-23 (×3): qty 3

## 2015-05-23 MED ORDER — IOHEXOL 350 MG/ML SOLN
80.0000 mL | Freq: Once | INTRAVENOUS | Status: AC | PRN
  Administered 2015-05-23: 80 mL via INTRAVENOUS

## 2015-05-23 MED ORDER — IRBESARTAN 75 MG PO TABS
37.5000 mg | ORAL_TABLET | Freq: Two times a day (BID) | ORAL | Status: DC
  Administered 2015-05-24 – 2015-05-26 (×5): 37.5 mg via ORAL
  Filled 2015-05-23 (×5): qty 1

## 2015-05-23 MED ORDER — INSULIN ASPART 100 UNIT/ML ~~LOC~~ SOLN: 0.0000 [IU] | Freq: Every day | SUBCUTANEOUS | Status: DC

## 2015-05-23 MED ORDER — FUROSEMIDE 10 MG/ML IJ SOLN
20.0000 mg | Freq: Once | INTRAMUSCULAR | Status: AC
  Administered 2015-05-23: 20 mg via INTRAVENOUS
  Filled 2015-05-23: qty 4

## 2015-05-23 MED ORDER — INSULIN ASPART 100 UNIT/ML ~~LOC~~ SOLN
0.0000 [IU] | Freq: Three times a day (TID) | SUBCUTANEOUS | Status: DC
  Administered 2015-05-24: 5 [IU] via SUBCUTANEOUS
  Filled 2015-05-23: qty 5

## 2015-05-23 MED ORDER — ACETAMINOPHEN 325 MG PO TABS
650.0000 mg | ORAL_TABLET | Freq: Four times a day (QID) | ORAL | Status: DC | PRN
  Administered 2015-05-24: 650 mg via ORAL
  Filled 2015-05-23: qty 2

## 2015-05-23 MED ORDER — PANTOPRAZOLE SODIUM 40 MG PO TBEC
40.0000 mg | DELAYED_RELEASE_TABLET | Freq: Every day | ORAL | Status: DC
  Administered 2015-05-24 – 2015-05-26 (×3): 40 mg via ORAL
  Filled 2015-05-23 (×3): qty 1

## 2015-05-23 MED ORDER — CILOSTAZOL 100 MG PO TABS
100.0000 mg | ORAL_TABLET | Freq: Two times a day (BID) | ORAL | Status: DC
  Administered 2015-05-24 – 2015-05-26 (×5): 100 mg via ORAL
  Filled 2015-05-23 (×6): qty 1

## 2015-05-23 MED ORDER — METHYLPREDNISOLONE SODIUM SUCC 125 MG IJ SOLR
60.0000 mg | Freq: Three times a day (TID) | INTRAMUSCULAR | Status: DC
  Administered 2015-05-24: 60 mg via INTRAVENOUS
  Filled 2015-05-23: qty 2

## 2015-05-23 MED ORDER — ASPIRIN 81 MG PO CHEW
324.0000 mg | CHEWABLE_TABLET | Freq: Once | ORAL | Status: AC
  Administered 2015-05-23: 324 mg via ORAL
  Filled 2015-05-23: qty 4

## 2015-05-23 MED ORDER — AZITHROMYCIN 250 MG PO TABS
500.0000 mg | ORAL_TABLET | Freq: Every day | ORAL | Status: DC
  Administered 2015-05-23 – 2015-05-24 (×2): 500 mg via ORAL
  Filled 2015-05-23 (×2): qty 2

## 2015-05-23 MED ORDER — ROSUVASTATIN CALCIUM 20 MG PO TABS
40.0000 mg | ORAL_TABLET | Freq: Every day | ORAL | Status: DC
  Administered 2015-05-24 – 2015-05-26 (×3): 40 mg via ORAL
  Filled 2015-05-23 (×4): qty 2

## 2015-05-23 MED ORDER — PREGABALIN 75 MG PO CAPS
150.0000 mg | ORAL_CAPSULE | Freq: Two times a day (BID) | ORAL | Status: DC
  Administered 2015-05-23 – 2015-05-26 (×6): 150 mg via ORAL
  Filled 2015-05-23 (×6): qty 2

## 2015-05-23 NOTE — ED Notes (Signed)
Says left arm pains since last night and shortness of breath.  Brought in by Social worker.

## 2015-05-23 NOTE — ED Notes (Signed)
Patient will need enough strips for 10 days to check her fsbs 3 times a day until human can supply her 10 days from now

## 2015-05-23 NOTE — ED Notes (Signed)
Patient transported to CT 

## 2015-05-23 NOTE — ED Provider Notes (Addendum)
[facility] Emergency Department Provider Note  ____________________________________________  Time seen: Approximately 3:03 PM  I have reviewed the triage vital signs and the nursing notes.   HISTORY  Chief Complaint Shortness of Breath and Chest Pain    HPI Alexis Ray is a 60 y.o. female with history of COPD, coronary artery disease, CHF, diabetes, hypertension hyperlipidemia who presents for evaluation of 3 days worsening shortness of breath and intermittent left-sided chest pains, gradual onset, intermittent, worse with exertion, currently mild to moderate. Patient reports that for the past several weeks she has had worsening shortness of breath as well as cough. She is seen in this emergency department approximately 3 weeks ago and symptoms were thought to be secondary to volume overload. Her Lasix was increased to 40 mg by mouth twice a day which she has been taking however 3 days ago her shortness of breath worsen. Currently she has no chest pain but she has intermittently had left-sided chest pain. She has had cough but denies any fevers. No vomiting or diarrhea. She reports to me that she does have a history of blood clots in her legs but is not chronically anticoagulated for this, she is not sure why that is the case.   Past Medical History  Diagnosis Date  . Diabetes mellitus without complication (HCC)   . Hypertension   . CHF (congestive heart failure) (HCC)     There are no active problems to display for this patient.   No past surgical history on file.  Current Outpatient Rx  Name  Route  Sig  Dispense  Refill  . aspirin EC 81 MG tablet   Oral   Take 81 mg by mouth daily.         . budesonide-formoterol (SYMBICORT) 160-4.5 MCG/ACT inhaler   Inhalation   Inhale 2 puffs into the lungs 2 (two) times daily.         . carvedilol (COREG) 12.5 MG tablet   Oral   Take 12.5 mg by mouth 2 (two) times daily.         . cholecalciferol  (VITAMIN D) 1000 units tablet   Oral   Take 1,000 Units by mouth daily.         . cilostazol (PLETAL) 100 MG tablet   Oral   Take 100 mg by mouth 2 (two) times daily.         Marland Kitchen esomeprazole (NEXIUM) 40 MG capsule   Oral   Take 40 mg by mouth daily at 12 noon.         . furosemide (LASIX) 40 MG tablet   Oral   Take 1 tablet (40 mg total) by mouth 2 (two) times daily.   60 tablet   0   . insulin aspart (NOVOLOG) 100 UNIT/ML injection   Subcutaneous   Inject 10 Units into the skin 3 (three) times daily with meals.         Marland Kitchen levocetirizine (XYZAL) 5 MG tablet   Oral   Take 5 mg by mouth at bedtime.          . Liraglutide (VICTOZA) 18 MG/3ML SOPN   Subcutaneous   Inject 1.8 mg into the skin daily.         . metFORMIN (GLUCOPHAGE) 1000 MG tablet   Oral   Take 1,000 mg by mouth 2 (two) times daily with a meal.         . nitroGLYCERIN (NITROSTAT) 0.4 MG SL tablet   Sublingual  Place 0.4 mg under the tongue every 5 (five) minutes as needed for chest pain.         . pregabalin (LYRICA) 150 MG capsule   Oral   Take 150 mg by mouth 2 (two) times daily.         . rosuvastatin (CRESTOR) 40 MG tablet   Oral   Take 40 mg by mouth daily.         Marland Kitchen spironolactone (ALDACTONE) 50 MG tablet   Oral   Take 50 mg by mouth daily.         Marland Kitchen tiotropium (SPIRIVA) 18 MCG inhalation capsule   Inhalation   Place 18 mcg into inhaler and inhale daily.         . valsartan (DIOVAN) 40 MG tablet   Oral   Take 40 mg by mouth 2 (two) times daily.         . Venlafaxine HCl 225 MG TB24   Oral   Take 225 mg by mouth daily.          . vitamin B-12 (CYANOCOBALAMIN) 1000 MCG tablet   Oral   Take 1,000 mcg by mouth daily.         . vitamin C (ASCORBIC ACID) 500 MG tablet   Oral   Take 500 mg by mouth 2 (two) times daily.           Allergies Review of patient's allergies indicates no known allergies.  No family history on file.  Social History Social  History  Substance Use Topics  . Smoking status: Current Every Day Smoker  . Smokeless tobacco: None  . Alcohol Use: No    Review of Systems Constitutional: No fever/chills Eyes: No visual changes. ENT: No sore throat. Cardiovascular:+ chest pain. Respiratory: +shortness of breath. Gastrointestinal: No abdominal pain.  No nausea, no vomiting.  No diarrhea.  No constipation. Genitourinary: Negative for dysuria. Musculoskeletal: Negative for back pain. Skin: Negative for rash. Neurological: Negative for headaches, focal weakness or numbness.  10-point ROS otherwise negative.  ____________________________________________   PHYSICAL EXAM:  VITAL SIGNS: ED Triage Vitals  Enc Vitals Group     BP 05/23/15 1255 127/71 mmHg     Pulse Rate 05/23/15 1255 87     Resp 05/23/15 1255 18     Temp 05/23/15 1255 98.4 F (36.9 C)     Temp Source 05/23/15 1255 Oral     SpO2 05/23/15 1255 95 %     Weight 05/23/15 1255 190 lb (86.183 kg)     Height 05/23/15 1255  (1.626 m)     Head Cir --      Peak Flow --      Pain Score 05/23/15 1256 4     Pain Loc --      Pain Edu? --      Excl. in GC? --     Constitutional: Alert and oriented.Nontoxic appearing and in no acute distress. Eyes: Conjunctivae are normal. PERRL. EOMI. Head: Atraumatic. Nose: No congestion/rhinnorhea. Mouth/Throat: Mucous membranes are moist.  Oropharynx non-erythematous. Neck: No stridor.   Cardiovascular: Normal rate, regular rhythm. Grossly normal heart sounds.  Good peripheral circulation. Respiratory: Crackles with rales at bilateral bases. Mild tachypnea. Gastrointestinal: Soft and nontender. No distention.  No CVA tenderness. Genitourinary: deferred Musculoskeletal: No lower extremity tenderness nor edema.  No joint effusions. Neurologic:  Normal speech and language. No gross focal neurologic deficits are appreciated. No gait instability. Skin:  Skin is warm, dry and intact. No rash  noted. Psychiatric:  Mood and affect are normal. Speech and behavior are normal.  ____________________________________________   LABS (all labs ordered are listed, but only abnormal results are displayed)  Labs Reviewed  BASIC METABOLIC PANEL - Abnormal; Notable for the following:    Creatinine, Ser 1.13 (*)    GFR calc non Af Amer 53 (*)    All other components within normal limits  CBC - Abnormal; Notable for the following:    Hemoglobin 11.5 (*)    MCV 72.2 (*)    MCH 22.9 (*)    MCHC 31.7 (*)    RDW 18.6 (*)    All other components within normal limits  TROPONIN I - Abnormal; Notable for the following:    Troponin I 0.09 (*)    All other components within normal limits  BRAIN NATRIURETIC PEPTIDE - Abnormal; Notable for the following:    B Natriuretic Peptide 332.0 (*)    All other components within normal limits  GLUCOSE, CAPILLARY   ____________________________________________  EKG  ED ECG REPORT I, Gayla Doss, the attending physician, personally viewed and interpreted this ECG.   Date: 05/23/2015  EKG Time: 13:03  Rate: 89  Rhythm: normal sinus rhythm, occasional PVCs  Axis: normal  Intervals:none  ST&T Change: No acute ST elevation. Nonspecific T-wave flattening in aVL, V3.  ____________________________________________  RADIOLOGY  CXR IMPRESSION: Mild congestive heart failure.  Aortic atherosclerosis.  CTA chest IMPRESSION: 1. No evidence of acute pulmonary embolism or other acute chest process. 2. Chronic lung disease with emphysema and linear scarring in the right middle lobe and lingula. 3. Stable right paratracheal lymph node. 4. Moderate atherosclerosis. 5. Reflux into the IVC and hepatic veins, suggesting right heart failure. 6. Mildly progressive discogenic sclerosis in the thoracic spine.   ____________________________________________   PROCEDURES  Procedure(s) performed: None  Critical Care performed:  No  ____________________________________________   INITIAL IMPRESSION / ASSESSMENT AND PLAN / ED COURSE  Pertinent labs & imaging results that were available during my care of the patient were reviewed by me and considered in my medical decision making (see chart for details).  Alexis Ray is a 60 y.o. female with history of COPD, coronary artery disease, CHF, diabetes, hypertension hyperlipidemia who presents for evaluation of 3 days worsening shortness of breath and intermittent left-sided chest pains. On exam she is nontoxic appearing. Mildly tachypnea with crackles/rales in bilateral lung bases. O2 saturation 89% on room air but improves to high 90s on just 2-3 L of oxygen. Patient reports she does use oxygen at night. EKG not consistent with acute ischemia. Troponin is elevated 0.09. Approximately 3 weeks ago, troponin was 0.06 which had been down trending from 0.8 previously. Given slight increase in troponin, this may represent new NSTEMI versus possibly demand ischemia given her known coronary artery disease. CBC notable for mild anemia with hemoglobin 11.5. BMP with mild creatinine elevation at 1.13. BNP is elevated at 332 and chest x-rays concerning for mild congestive heart failure. Suspect her symptoms may be secondary to continued volume overload which has failed outpatient oral diuretic therapy. She will likely require admission for inpatient diuresis however given her complaint of blood clots in her legs previously and the fact that she is not chronically anticoagulated, will obtain CTA chest to first rule out PE.  ----------------------------------------- 6:32 PM on 05/23/2015 ----------------------------------------- CTA chest is negative for PE. Lasix was ordered. Aspirin ordered. Case discussed with Dr. Clent Ridges, hospitalist, for admission.  ----------------------------------------- 8:11 PM on 05/23/2015 ----------------------------------------- CTA  chest did not show  interstitial fluid, several DuoNeb's were given to the patient, soluMedrol was also given. At this time, she has improvement in her air movement but continues to have prolonged expiratory phase,  tachypnea, with increased work of breathing. At this time, her exam is more consistent with a COPD exacerbation. She continues to feel unwell and unable to go home and I have discussed the case with Dr. Hilton Sinclair for admission. ____________________________________________   FINAL CLINICAL IMPRESSION(S) / ED DIAGNOSES  Final diagnoses:  Acute on chronic congestive heart failure, unspecified congestive heart failure type (HCC)  SOB (shortness of breath)  Chronic obstructive pulmonary disease, unspecified COPD type (HCC)      Gayla Doss, MD 05/23/15 1610  Gayla Doss, MD 05/23/15 2014

## 2015-05-23 NOTE — H&P (Signed)
[facility] - [place] at [facility]   PATIENT NAME: Alexis Ray    MR#:  [id number]  DATE OF BIRTH:  1956-03-16  DATE OF ADMISSION:  05/23/2015  PRIMARY CARE PHYSICIAN: No primary care provider on file.   REQUESTING/REFERRING PHYSICIAN: Inocencio Homes, MD  CHIEF COMPLAINT:   Chief Complaint  Patient presents with  . Shortness of Breath  . Chest Pain    HISTORY OF PRESENT ILLNESS:  Alexis Ray  is a 60 y.o. female who presents with CHF and COPD.  patient states that she began feeling short of breath 2-3 days ago. These symptoms have progressed since that time. Her standard home medications have not improved her symptoms. Evaluation here in the ED showed elevated BNP, though not terribly above her baseline. Chest x-ray showed mild congestive heart failure. Hospitalists were called for admission for exacerbation of heart failure as well as COPD.  PAST MEDICAL HISTORY:   Past Medical History  Diagnosis Date  . Diabetes mellitus without complication (HCC)   . Hypertension   . Chronic combined systolic and diastolic CHF (congestive heart failure) (HCC)   . Stroke (cerebrum) (HCC)   . COPD (chronic obstructive pulmonary disease) (HCC)   . GERD (gastroesophageal reflux disease)   . PVD (peripheral vascular disease) (HCC)   . CAD (coronary artery disease)     PAST SURGICAL HISTORY:   Past Surgical History  Procedure Laterality Date  . Abdominal hysterectomy    . Colonoscopy    . Cardiac catheterization      SOCIAL HISTORY:   Social History  Substance Use Topics  . Smoking status: Current Every Day Smoker  . Smokeless tobacco: Not on file  . Alcohol Use: No    FAMILY HISTORY:  No family history on file.  DRUG ALLERGIES:  No Known Allergies  MEDICATIONS AT HOME:   Prior to Admission medications   Medication Sig Start Date End Date Taking? Authorizing Provider  aspirin EC 81 MG tablet Take 81 mg by mouth daily.   Yes Historical Provider, MD   budesonide-formoterol (SYMBICORT) 160-4.5 MCG/ACT inhaler Inhale 2 puffs into the lungs 2 (two) times daily.   Yes Historical Provider, MD  carvedilol (COREG) 12.5 MG tablet Take 12.5 mg by mouth 2 (two) times daily.   Yes Historical Provider, MD  cholecalciferol (VITAMIN D) 1000 units tablet Take 1,000 Units by mouth daily.   Yes Historical Provider, MD  cilostazol (PLETAL) 100 MG tablet Take 100 mg by mouth 2 (two) times daily.   Yes Historical Provider, MD  esomeprazole (NEXIUM) 40 MG capsule Take 40 mg by mouth daily at 12 noon.   Yes Historical Provider, MD  furosemide (LASIX) 40 MG tablet Take 1 tablet (40 mg total) by mouth 2 (two) times daily. 05/01/15 04/30/16 Yes Jennye Moccasin, MD  insulin aspart (NOVOLOG) 100 UNIT/ML injection Inject 10 Units into the skin 3 (three) times daily with meals.   Yes Historical Provider, MD  levocetirizine (XYZAL) 5 MG tablet Take 5 mg by mouth at bedtime.    Yes Historical Provider, MD  Liraglutide (VICTOZA) 18 MG/3ML SOPN Inject 1.8 mg into the skin daily.   Yes Historical Provider, MD  metFORMIN (GLUCOPHAGE) 1000 MG tablet Take 1,000 mg by mouth 2 (two) times daily with a meal.   Yes Historical Provider, MD  nitroGLYCERIN (NITROSTAT) 0.4 MG SL tablet Place 0.4 mg under the tongue every 5 (five) minutes as needed for chest pain.   Yes Historical Provider, MD  pregabalin (LYRICA)  150 MG capsule Take 150 mg by mouth 2 (two) times daily.   Yes Historical Provider, MD  rosuvastatin (CRESTOR) 40 MG tablet Take 40 mg by mouth daily.   Yes Historical Provider, MD  spironolactone (ALDACTONE) 50 MG tablet Take 50 mg by mouth daily.   Yes Historical Provider, MD  tiotropium (SPIRIVA) 18 MCG inhalation capsule Place 18 mcg into inhaler and inhale daily.   Yes Historical Provider, MD  valsartan (DIOVAN) 40 MG tablet Take 40 mg by mouth 2 (two) times daily.   Yes Historical Provider, MD  Venlafaxine HCl 225 MG TB24 Take 225 mg by mouth daily.    Yes Historical  Provider, MD  vitamin B-12 (CYANOCOBALAMIN) 1000 MCG tablet Take 1,000 mcg by mouth daily.   Yes Historical Provider, MD  vitamin C (ASCORBIC ACID) 500 MG tablet Take 500 mg by mouth 2 (two) times daily.   Yes Historical Provider, MD    REVIEW OF SYSTEMS:  Review of Systems  Constitutional: Negative for fever, chills, weight loss and malaise/fatigue.  HENT: Negative for ear pain, hearing loss and tinnitus.   Eyes: Negative for blurred vision, double vision, pain and redness.  Respiratory: Positive for shortness of breath and wheezing. Negative for cough and hemoptysis.   Cardiovascular: Positive for orthopnea. Negative for chest pain, palpitations and leg swelling.  Gastrointestinal: Negative for nausea, vomiting, abdominal pain, diarrhea and constipation.  Genitourinary: Negative for dysuria, frequency and hematuria.  Musculoskeletal: Negative for back pain, joint pain and neck pain.  Skin:       No acne, rash, or lesions  Neurological: Negative for dizziness, tremors, focal weakness and weakness.  Endo/Heme/Allergies: Negative for polydipsia. Does not bruise/bleed easily.  Psychiatric/Behavioral: Negative for depression. The patient is not nervous/anxious and does not have insomnia.      VITAL SIGNS:   Filed Vitals:   05/23/15 1630 05/23/15 1730 05/23/15 1830 05/23/15 1900  BP: 116/86 123/69 125/69 130/71  Pulse: 79 80 78 77  Temp:      TempSrc:      Resp: Height:      Weight:      SpO2: 99% 93% 93% 95%   Wt Readings from Last 3 Encounters:  05/23/15 86.183 kg (190 lb)  05/01/15 87.091 kg (192 lb)  01/21/15 88.2 kg (194 lb 7.1 oz)    PHYSICAL EXAMINATION:  Physical Exam  Vitals reviewed. Constitutional: She is oriented to person, place, and time. She appears well-developed and well-nourished. No distress.  HENT:  Head: Normocephalic and atraumatic.  Mouth/Throat: Oropharynx is clear and moist.  Eyes: Conjunctivae and EOM are normal. Pupils are equal,  round, and reactive to light. No scleral icterus.  Neck: Normal range of motion. Neck supple. No JVD present. No thyromegaly present.  Cardiovascular: Normal rate, regular rhythm and intact distal pulses.  Exam reveals no gallop and no friction rub.   No murmur heard. Respiratory: She is in respiratory distress (mild). She has wheezes. She has no rales.  GI: Soft. Bowel sounds are normal. She exhibits no distension. There is no tenderness.  Musculoskeletal: Normal range of motion. She exhibits no edema.  No arthritis, no gout  Lymphadenopathy:    She has no cervical adenopathy.  Neurological: She is alert and oriented to person, place, and time. No cranial nerve deficit.  No dysarthria, no aphasia  Skin: Skin is warm and dry. No rash noted. No erythema.  Psychiatric: She has a normal mood and affect. Her behavior is normal.  Judgment and thought content normal.    LABORATORY PANEL:   CBC  Recent Labs Lab 05/23/15 1307  WBC 8.3  HGB 11.5*  HCT 36.4  PLT 200   ------------------------------------------------------------------------------------------------------------------  Chemistries   Recent Labs Lab 05/23/15 1307  NA 139  K 3.7  CL 103  CO2 27  GLUCOSE 97  BUN 15  CREATININE 1.13*  CALCIUM 8.9   ------------------------------------------------------------------------------------------------------------------  Cardiac Enzymes  Recent Labs Lab 05/23/15 1307  TROPONINI 0.09*   ------------------------------------------------------------------------------------------------------------------  RADIOLOGY:  Dg Chest 2 View  05/23/2015  CLINICAL DATA:  SOB x 2 weeks at night and in the morning, chest pain radiating to left arm, smoker x 42 years, hx COPD, CHF, 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 Mild cardiomegaly. Atherosclerosis in the transverse aorta. No pleural effusion or pneumothorax. similar  interstitial prominence and indistinctness. No lobar consolidation. IMPRESSION: Mild congestive heart failure. Aortic atherosclerosis. Electronically Signed   By: Jeronimo Greaves M.D.   On: 05/23/2015 13:44   Ct Angio Chest Pe W/cm &/or Wo Cm  05/23/2015  CLINICAL DATA:  Left arm pain and shortness of breath since last night. EXAM: CT ANGIOGRAPHY CHEST WITH CONTRAST TECHNIQUE: Multidetector CT imaging of the chest was performed using the standard protocol during bolus administration of intravenous contrast. Multiplanar CT image reconstructions and MIPs were obtained to evaluate the vascular anatomy. CONTRAST:  80mL OMNIPAQUE IOHEXOL 350 MG/ML SOLN COMPARISON:  Radiographs 05/23/2015 and 05/01/2015.  CT 12/11/2013. FINDINGS: Mediastinum: The pulmonary arteries are well opacified with contrast. There is no evidence of acute pulmonary embolism. There is atherosclerosis of the aorta, great vessels and coronary arteries.Right paratracheal node measuring 12 mm short axis on image 31 is stable. No other enlarged mediastinal or hilar lymph nodes are identified. The heart size is normal. There is a small amount pericardial fluid. The thyroid gland, trachea and esophagus demonstrate no significant findings. Lungs/Pleura: There is no pleural effusion.Moderate centrilobular emphysema with scattered subpleural blebs. There are linear densities in the lingula and right middle lobe (best seen on the reformatted images). No recurrent airspace disease, suspicious pulmonary nodule or endobronchial lesion. Upper abdomen: There is reflux of contrasted blood into the IVC and hepatic veins. The visualized upper abdomen otherwise appears unchanged. Musculoskeletal/Chest wall: No chest wall lesion or acute osseous findings.Prominent sclerosis in the mid thoracic spine is again noted, slightly progressive and likely discogenic in origin. Review of the MIP images confirms the above findings. IMPRESSION: 1. No evidence of acute pulmonary  embolism or other acute chest process. 2. Chronic lung disease with emphysema and linear scarring in the right middle lobe and lingula. 3. Stable right paratracheal lymph node. 4. Moderate atherosclerosis. 5. Reflux into the IVC and hepatic veins, suggesting right heart failure. 6. Mildly progressive discogenic sclerosis in the thoracic spine. Electronically Signed   By: Carey Bullocks M.D.   On: 05/23/2015 17:43    EKG:   Orders placed or performed during the hospital encounter of 05/23/15  . ED EKG within 10 minutes  . ED EKG within 10 minutes    IMPRESSION AND PLAN:  Principal Problem:   Acute on chronic combined systolic and diastolic CHF (congestive heart failure) (HCC) - IV Lasix in the ED, we'll utilize CPAP daily at bedtime for OSA, but may also help some with her pulmonary edema. Monitor I's and O's, continue home dose diuretics after IV dose in the ED. Repeat echocardiogram.  Continue home meds for this as  well. Active Problems:   COPD exacerbation (HCC) - continue home inhalers, while inpatient will add IV steroids, DuoNeb's when necessary, azithromycin, sputum culture if possible to obtain.   Type 2 diabetes mellitus (HCC) - sliding scale insulin and appropriate glucose checks with carb modified diet   HTN (hypertension) - continue home meds   PVD (peripheral vascular disease) (HCC) - continue home meds   OSA on CPAP - CPAP daily at bedtime   GERD (gastroesophageal reflux disease) - continue home meds  All the records are reviewed and case discussed with ED provider. Management plans discussed with the patient and/or family.  DVT PROPHYLAXIS: SubQ lovenox  GI PROPHYLAXIS: PPI  ADMISSION STATUS: Inpatient  CODE STATUS: Full Code Status History    This patient does not have a recorded code status. Please follow your organizational policy for patients in this situation.      TOTAL TIME TAKING CARE OF THIS PATIENT: 45 minutes.    Abby Stines FIELDING 05/23/2015, 9:05  PM  [employer]  Office  [phone]  CC: Primary care physician; No primary care provider on file.

## 2015-05-24 ENCOUNTER — Inpatient Hospital Stay
Admit: 2015-05-24 | Discharge: 2015-05-24 | Disposition: A | Discharge status: Home / Self Care | Payer: Medicare HMO | Attending: Internal Medicine | Admitting: Internal Medicine

## 2015-05-24 LAB — GLUCOSE, CAPILLARY
GLUCOSE-CAPILLARY: 283 mg/dL — AB (ref 65–99)
GLUCOSE-CAPILLARY: 446 mg/dL — AB (ref 65–99)
Glucose-Capillary: 423 mg/dL — ABNORMAL HIGH (ref 65–99)
Glucose-Capillary: 394 mg/dL — ABNORMAL HIGH (ref 65–99)
Glucose-Capillary: 311 mg/dL — ABNORMAL HIGH (ref 65–99)

## 2015-05-24 LAB — BASIC METABOLIC PANEL
ANION GAP: 8 (ref 5–15)
BUN: 25 mg/dL — ABNORMAL HIGH (ref 6–20)
CALCIUM: 8.7 mg/dL — AB (ref 8.9–10.3)
CHLORIDE: 102 mmol/L (ref 101–111)
CO2: 29 mmol/L (ref 22–32)
Creatinine, Ser: 1.19 mg/dL — ABNORMAL HIGH (ref 0.44–1.00)
GFR calc non Af Amer: 49 mL/min — ABNORMAL LOW (ref >60)
GFR, EST AFRICAN AMERICAN: 57 mL/min — AB (ref >60)
Glucose, Bld: 357 mg/dL — ABNORMAL HIGH (ref 65–99)
Potassium: 4.5 mmol/L (ref 3.5–5.1)
SODIUM: 139 mmol/L (ref 135–145)

## 2015-05-24 LAB — TROPONIN I
TROPONIN I: 0.05 ng/mL — AB (ref <0.031)
Troponin I: 0.08 ng/mL — ABNORMAL HIGH (ref <0.031)
Troponin I: 0.06 ng/mL — ABNORMAL HIGH (ref <0.031)

## 2015-05-24 LAB — EXPECTORATED SPUTUM ASSESSMENT W REFEX TO RESP CULTURE

## 2015-05-24 LAB — CBC
HCT: 36.1 % (ref 35.0–47.0)
HEMOGLOBIN: 11.2 g/dL — AB (ref 12.0–16.0)
MCH: 22.2 pg — AB (ref 26.0–34.0)
MCHC: 31.1 g/dL — ABNORMAL LOW (ref 32.0–36.0)
MCV: 71.3 fL — ABNORMAL LOW (ref 80.0–100.0)
Platelets: 189 10*3/uL (ref 150–440)
RBC: 5.07 MIL/uL (ref 3.80–5.20)
RDW: 18.4 % — ABNORMAL HIGH (ref 11.5–14.5)
WBC: 6.4 10*3/uL (ref 3.6–11.0)

## 2015-05-24 LAB — HEMOGLOBIN A1C: Hgb A1c MFr Bld: 6.8 % — ABNORMAL HIGH (ref 4.0–6.0)

## 2015-05-24 LAB — EXPECTORATED SPUTUM ASSESSMENT W GRAM STAIN, RFLX TO RESP C

## 2015-05-24 MED ORDER — METHYLPREDNISOLONE SODIUM SUCC 40 MG IJ SOLR
40.0000 mg | Freq: Two times a day (BID) | INTRAMUSCULAR | Status: DC
  Administered 2015-05-24 – 2015-05-26 (×4): 40 mg via INTRAVENOUS
  Filled 2015-05-24 (×4): qty 1

## 2015-05-24 MED ORDER — NICOTINE 21 MG/24HR TD PT24
21.0000 mg | MEDICATED_PATCH | Freq: Every day | TRANSDERMAL | Status: DC
  Administered 2015-05-24: 21 mg via TRANSDERMAL
  Filled 2015-05-24: qty 1

## 2015-05-24 MED ORDER — NICOTINE 21 MG/24HR TD PT24
21.0000 mg | MEDICATED_PATCH | Freq: Every day | TRANSDERMAL | Status: DC
  Administered 2015-05-24 – 2015-05-26 (×3): 21 mg via TRANSDERMAL
  Filled 2015-05-24 (×3): qty 1

## 2015-05-24 MED ORDER — INSULIN ASPART 100 UNIT/ML ~~LOC~~ SOLN
15.0000 [IU] | Freq: Once | SUBCUTANEOUS | Status: AC
  Administered 2015-05-24: 15 [IU] via SUBCUTANEOUS
  Filled 2015-05-24: qty 15

## 2015-05-24 MED ORDER — GUAIFENESIN-DM 100-10 MG/5ML PO SYRP: 10.0000 mL | ORAL_SOLUTION | ORAL | Status: DC | PRN

## 2015-05-24 MED ORDER — INSULIN ASPART 100 UNIT/ML ~~LOC~~ SOLN
0.0000 [IU] | Freq: Three times a day (TID) | SUBCUTANEOUS | Status: DC
  Administered 2015-05-24 – 2015-05-25 (×2): 15 [IU] via SUBCUTANEOUS
  Administered 2015-05-25 – 2015-05-26 (×2): 3 [IU] via SUBCUTANEOUS
  Administered 2015-05-26: 8 [IU] via SUBCUTANEOUS
  Administered 2015-05-26: 5 [IU] via SUBCUTANEOUS
  Filled 2015-05-24: qty 3
  Filled 2015-05-24: qty 8
  Filled 2015-05-24: qty 5
  Filled 2015-05-24: qty 3
  Filled 2015-05-24: qty 15
  Filled 2015-05-24: qty 18

## 2015-05-24 MED ORDER — INSULIN GLARGINE 100 UNIT/ML ~~LOC~~ SOLN
13.0000 [IU] | Freq: Every day | SUBCUTANEOUS | Status: DC
  Administered 2015-05-24: 13 [IU] via SUBCUTANEOUS
  Filled 2015-05-24 (×2): qty 0.13

## 2015-05-24 MED ORDER — IPRATROPIUM-ALBUTEROL 0.5-2.5 (3) MG/3ML IN SOLN
3.0000 mL | Freq: Three times a day (TID) | RESPIRATORY_TRACT | Status: DC
  Administered 2015-05-24 – 2015-05-26 (×5): 3 mL via RESPIRATORY_TRACT
  Filled 2015-05-24 (×4): qty 3

## 2015-05-24 MED ORDER — INSULIN ASPART 100 UNIT/ML ~~LOC~~ SOLN
0.0000 [IU] | Freq: Every day | SUBCUTANEOUS | Status: DC
  Administered 2015-05-24: 4 [IU] via SUBCUTANEOUS
  Administered 2015-05-25: 5 [IU] via SUBCUTANEOUS
  Filled 2015-05-24: qty 5
  Filled 2015-05-24: qty 4

## 2015-05-24 NOTE — Progress Notes (Signed)
[facility] -  at [facility]   PATIENT NAME: Alexis Ray    MR#:  [id number]  DATE OF BIRTH:  [birth year]  SUBJECTIVE:  CHIEF COMPLAINT:  Pt s sob is better , denies any wheezing but admits cough   REVIEW OF SYSTEMS:  CONSTITUTIONAL: No fever, fatigue or weakness.  EYES: No blurred or double vision.  EARS, NOSE, AND THROAT: No tinnitus or ear pain.  RESPIRATORY: Has cough, improving shortness of breath, no wheezing or hemoptysis.  CARDIOVASCULAR: No chest pain, orthopnea, edema.  GASTROINTESTINAL: No nausea, vomiting, diarrhea or abdominal pain.  GENITOURINARY: No dysuria, hematuria.  ENDOCRINE: No polyuria, nocturia,  HEMATOLOGY: No anemia, easy bruising or bleeding SKIN: No rash or lesion. MUSCULOSKELETAL: No joint pain or arthritis.   NEUROLOGIC: No tingling, numbness, weakness.  PSYCHIATRY: No anxiety or depression.   DRUG ALLERGIES:  No Known Allergies  VITALS:  Blood pressure 144/85, pulse 87, temperature 97.8 F (36.6 C), temperature source Oral, resp. rate 22, height  (1.626 m), weight 85.276 kg (188 lb), SpO2 93 %.  PHYSICAL EXAMINATION:  GENERAL:  60 y.o.-year-old patient lying in the bed with no acute distress.  EYES: Pupils equal, round, reactive to light and accommodation. No scleral icterus. Extraocular muscles intact.  HEENT: Head atraumatic, normocephalic. Oropharynx and nasopharynx clear.  NECK:  Supple, no jugular venous distention. No thyroid enlargement, no tenderness.  LUNGS: Coarse  breath sounds bilaterally, no wheezing, rales,rhonchi or crepitation. No use of accessory muscles of respiration.  CARDIOVASCULAR: S1, S2 normal. No murmurs, rubs, or gallops.  ABDOMEN: Soft, nontender, nondistended. Bowel sounds present. No organomegaly or mass.  EXTREMITIES: No pedal edema, cyanosis, or clubbing.  NEUROLOGIC: Cranial nerves II through XII are intact. Muscle strength 5/5 in all extremities. Sensation intact. Gait not  checked.  PSYCHIATRIC: The patient is alert and oriented x 3.  SKIN: No obvious rash, lesion, or ulcer.    LABORATORY PANEL:   CBC  Recent Labs Lab 05/24/15 0546  WBC 6.4  HGB 11.2*  HCT 36.1  PLT 189   ------------------------------------------------------------------------------------------------------------------  Chemistries   Recent Labs Lab 05/24/15 0546  NA 139  K 4.5  CL 102  CO2 29  GLUCOSE 357*  BUN 25*  CREATININE 1.19*  CALCIUM 8.7*   ------------------------------------------------------------------------------------------------------------------  Cardiac Enzymes  Recent Labs Lab 05/24/15 1234  TROPONINI 0.06*   ------------------------------------------------------------------------------------------------------------------  RADIOLOGY:  Dg Chest 2 View  05/23/2015  CLINICAL DATA:  SOB x 2 weeks at night and in the morning, chest pain radiating to left arm, smoker x 42 years, hx COPD, CHF, 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 Mild cardiomegaly. Atherosclerosis in the transverse aorta. No pleural effusion or pneumothorax. similar interstitial prominence and indistinctness. No lobar consolidation. IMPRESSION: Mild congestive heart failure. Aortic atherosclerosis. Electronically Signed   By: Jeronimo Greaves M.D.   On: 05/23/2015 13:44   Ct Angio Chest Pe W/cm &/or Wo Cm  05/23/2015  CLINICAL DATA:  Left arm pain and shortness of breath since last night. EXAM: CT ANGIOGRAPHY CHEST WITH CONTRAST TECHNIQUE: Multidetector CT imaging of the chest was performed using the standard protocol during bolus administration of intravenous contrast. Multiplanar CT image reconstructions and MIPs were obtained to evaluate the vascular anatomy. CONTRAST:  80mL OMNIPAQUE IOHEXOL 350 MG/ML SOLN COMPARISON:  Radiographs 05/23/2015 and 05/01/2015.  CT 12/11/2013. FINDINGS: Mediastinum: The pulmonary arteries are well  opacified with contrast. There is no evidence of acute pulmonary embolism. There  is atherosclerosis of the aorta, great vessels and coronary arteries.Right paratracheal node measuring 12 mm short axis on image 31 is stable. No other enlarged mediastinal or hilar lymph nodes are identified. The heart size is normal. There is a small amount pericardial fluid. The thyroid gland, trachea and esophagus demonstrate no significant findings. Lungs/Pleura: There is no pleural effusion.Moderate centrilobular emphysema with scattered subpleural blebs. There are linear densities in the lingula and right middle lobe (best seen on the reformatted images). No recurrent airspace disease, suspicious pulmonary nodule or endobronchial lesion. Upper abdomen: There is reflux of contrasted blood into the IVC and hepatic veins. The visualized upper abdomen otherwise appears unchanged. Musculoskeletal/Chest wall: No chest wall lesion or acute osseous findings.Prominent sclerosis in the mid thoracic spine is again noted, slightly progressive and likely discogenic in origin. Review of the MIP images confirms the above findings. IMPRESSION: 1. No evidence of acute pulmonary embolism or other acute chest process. 2. Chronic lung disease with emphysema and linear scarring in the right middle lobe and lingula. 3. Stable right paratracheal lymph node. 4. Moderate atherosclerosis. 5. Reflux into the IVC and hepatic veins, suggesting right heart failure. 6. Mildly progressive discogenic sclerosis in the thoracic spine. Electronically Signed   By: Carey Bullocks M.D.   On: 05/23/2015 17:43    EKG:   Orders placed or performed during the hospital encounter of 05/23/15  . ED EKG within 10 minutes  . ED EKG within 10 minutes    ASSESSMENT AND PLAN:   # Acute on chronic combined systolic and diastolic CHF (congestive heart failure) (HCC) - Continue  IV Lasix  we'll continue CPAP daily at bedtime for OSA, but may also help some with her  pulmonary edema.  Monitor I's and O's  continue home dose diuretics after IV dose   Repeat echocardiogram.  Continue home meds  Cardio is recommending stress test- not sure when ? , will make pt NPO after midnight  2.COPD exacerbation (HCC) -  continue home inhalers, IV steroids, DuoNeb's when necessary, azithromycin, sputum culture if possible to obtain . 3 Type 2 diabetes mellitus (HCC) - sliding scale insulin and appropriate glucose checks with carb modified diet  4 HTN (hypertension) - continue home meds  5 PVD (peripheral vascular disease) (HCC) - continue home meds  6. OSA on CPAP - CPAP daily at bedtime  7. GERD (gastroesophageal reflux disease) - continue home meds  8. Tobaco abuse  counselled pt to quit smoking  For 3-5 mmin Nicotine patch , pt is considering to quit smoking      All the records are reviewed and case discussed with Care Management/Social Workerr. Management plans discussed with the patient, family and they are in agreement.  CODE STATUS: fc   TOTAL TIME TAKING CARE OF THIS PATIENT: 35  minutes.   POSSIBLE D/C IN 1-2  DAYS, DEPENDING ON CLINICAL CONDITION.   Ramonita Lab M.D on 05/24/2015 at 9:23 PM  Between 7am to 6pm - Pager - [phone] After 6pm go to [email or web address] - password [email or web address] [facility]  [place] [facility]  Office  [phone]  CC: Primary care physician; No primary care provider on file.

## 2015-05-24 NOTE — Progress Notes (Signed)
Pt. Is no longer wheezing or SOB. She scored a 1 on the RT assessment. The nebulizer treatments were changed to TID and prn.

## 2015-05-24 NOTE — Consult Note (Signed)
Reason for Consult:angina heart failure shortness of breath Referring Physician:  Dr. Lance Coon  hospitalist  Alexis Ray is an 61 y.o. female.  HPI:  60 y/o black female with congestive heart failure COPD shortness of breath complained of chest pain and anginal symptoms. The patient states she has been short of breath for about 3 days patient has symptoms of progress she has been on her usual home medications without improved. The patient describes midsternal chest discomfort shortness of breath and dyspnea chronic intermittent chills evaluation was found have an elevated BNP chest x-ray suggested congestive heart failure so she was admitted for evaluation of unstable angina and heart failure symptoms.  Past Medical History  Diagnosis Date  . Diabetes mellitus without complication ([place])   . Hypertension   . Chronic combined systolic and diastolic CHF (congestive heart failure) ([place])   . Stroke (cerebrum) ([place])   . COPD (chronic obstructive pulmonary disease) ([place])   . GERD (gastroesophageal reflux disease)   . PVD (peripheral vascular disease) ([place])   . CAD (coronary artery disease)     Past Surgical History  Procedure Laterality Date  . Abdominal hysterectomy    . Colonoscopy    . Cardiac catheterization      History reviewed. No pertinent family history.  Social History:  reports that she has been smoking.  She does not have any smokeless tobacco history on file. She reports that she does not drink alcohol. Her drug history is not on file.  Allergies: No Known Allergies  Medications: I have reviewed the patient's current medications.  Results for orders placed or performed during the hospital encounter of 05/23/15 (from the past 48 hour(s))  Basic metabolic panel     Status: Abnormal   Collection Time: 05/23/15  1:07 PM  Result Value Ref Range   Sodium 139 135 - 145 mmol/L   Potassium 3.7 3.5 - 5.1 mmol/L   Chloride 103 101 - 111 mmol/L   CO2 27 22 - 32 mmol/L    Glucose, Bld 97 65 - 99 mg/dL   BUN 15 6 - 20 mg/dL   Creatinine, Ser 1.13 (H) 0.44 - 1.00 mg/dL   Calcium 8.9 8.9 - 10.3 mg/dL   GFR calc non Af Amer 53 (L) >60 mL/min   GFR calc Af Amer >60 >60 mL/min    Comment: (NOTE) The eGFR has been calculated using the CKD EPI equation. This calculation has not been validated in all clinical situations. eGFR's persistently <60 mL/min signify possible Chronic Kidney Disease.    Anion gap 9 5 - 15  CBC     Status: Abnormal   Collection Time: 05/23/15  1:07 PM  Result Value Ref Range   WBC 8.3 3.6 - 11.0 K/uL   RBC 5.04 3.80 - 5.20 MIL/uL   Hemoglobin 11.5 (L) 12.0 - 16.0 g/dL   HCT 36.4 35.0 - 47.0 %   MCV 72.2 (L) 80.0 - 100.0 fL   MCH 22.9 (L) 26.0 - 34.0 pg   MCHC 31.7 (L) 32.0 - 36.0 g/dL   RDW 18.6 (H) 11.5 - 14.5 %   Platelets 200 150 - 440 K/uL  Troponin I     Status: Abnormal   Collection Time: 05/23/15  1:07 PM  Result Value Ref Range   Troponin I 0.09 (H) <0.031 ng/mL    Comment: CRITICAL RESULT CALLED TO, READ BACK BY AND VERIFIED WITH CASEY PIERCE, RN'@1447'$  ON 1/182017. VAB        PERSISTENTLY INCREASED  TROPONIN VALUES IN THE RANGE OF 0.04-0.49 ng/mL CAN BE SEEN IN:       -UNSTABLE ANGINA       -CONGESTIVE HEART FAILURE       -MYOCARDITIS       -CHEST TRAUMA       -ARRYHTHMIAS       -LATE PRESENTING MYOCARDIAL INFARCTION       -COPD   CLINICAL FOLLOW-UP RECOMMENDED.   Brain natriuretic peptide     Status: Abnormal   Collection Time: 05/23/15  1:07 PM  Result Value Ref Range   B Natriuretic Peptide 332.0 (H) 0.0 - 100.0 pg/mL  Glucose, capillary     Status: None   Collection Time: 05/23/15  3:12 PM  Result Value Ref Range   Glucose-Capillary 68 65 - 99 mg/dL   Comment 1 Notify RN   Glucose, capillary     Status: None   Collection Time: 05/23/15 10:34 PM  Result Value Ref Range   Glucose-Capillary 87 65 - 99 mg/dL  Hemoglobin T2N     Status: Abnormal   Collection Time: 05/24/15 12:29 AM  Result Value Ref Range    Hgb A1c MFr Bld 6.8 (H) 4.0 - 6.0 %  Troponin I     Status: Abnormal   Collection Time: 05/24/15 12:29 AM  Result Value Ref Range   Troponin I 0.08 (H) <0.031 ng/mL    Comment: PREVIOUS RESULT CALLED TO CASEY PIERCE ON 05/23/15 AT 1447 VAB/SRC        PERSISTENTLY INCREASED TROPONIN VALUES IN THE RANGE OF 0.04-0.49 ng/mL CAN BE SEEN IN:       -UNSTABLE ANGINA       -CONGESTIVE HEART FAILURE       -MYOCARDITIS       -CHEST TRAUMA       -ARRYHTHMIAS       -LATE PRESENTING MYOCARDIAL INFARCTION       -COPD   CLINICAL FOLLOW-UP RECOMMENDED.   Troponin I     Status: Abnormal   Collection Time: 05/24/15  5:46 AM  Result Value Ref Range   Troponin I 0.05 (H) <0.031 ng/mL    Comment: PREVIOUS RESULT CALLED TO CASEY PIERCE AT 1447 ON 05/23/15 BY VAB.Marland KitchenMarland KitchenMMC        PERSISTENTLY INCREASED TROPONIN VALUES IN THE RANGE OF 0.04-0.49 ng/mL CAN BE SEEN IN:       -UNSTABLE ANGINA       -CONGESTIVE HEART FAILURE       -MYOCARDITIS       -CHEST TRAUMA       -ARRYHTHMIAS       -LATE PRESENTING MYOCARDIAL INFARCTION       -COPD   CLINICAL FOLLOW-UP RECOMMENDED.   Basic metabolic panel     Status: Abnormal   Collection Time: 05/24/15  5:46 AM  Result Value Ref Range   Sodium 139 135 - 145 mmol/L   Potassium 4.5 3.5 - 5.1 mmol/L   Chloride 102 101 - 111 mmol/L   CO2 29 22 - 32 mmol/L   Glucose, Bld 357 (H) 65 - 99 mg/dL   BUN 25 (H) 6 - 20 mg/dL   Creatinine, Ser 5.57 (H) 0.44 - 1.00 mg/dL   Calcium 8.7 (L) 8.9 - 10.3 mg/dL   GFR calc non Af Amer 49 (L) >60 mL/min   GFR calc Af Amer 57 (L) >60 mL/min    Comment: (NOTE) The eGFR has been calculated using the CKD EPI equation. This calculation has not been validated in  all clinical situations. eGFR's persistently <60 mL/min signify possible Chronic Kidney Disease.    Anion gap 8 5 - 15  CBC     Status: Abnormal   Collection Time: 05/24/15  5:46 AM  Result Value Ref Range   WBC 6.4 3.6 - 11.0 K/uL   RBC 5.07 3.80 - 5.20 MIL/uL    Hemoglobin 11.2 (L) 12.0 - 16.0 g/dL   HCT 36.9 22.3 - 00.9 %   MCV 71.3 (L) 80.0 - 100.0 fL   MCH 22.2 (L) 26.0 - 34.0 pg   MCHC 31.1 (L) 32.0 - 36.0 g/dL   RDW 79.4 (H) 99.7 - 18.2 %   Platelets 189 150 - 440 K/uL  Glucose, capillary     Status: Abnormal   Collection Time: 05/24/15  7:58 AM  Result Value Ref Range   Glucose-Capillary 283 (H) 65 - 99 mg/dL   Comment 1 Notify RN    Comment 2 Document in Chart   Glucose, capillary     Status: Abnormal   Collection Time: 05/24/15 11:20 AM  Result Value Ref Range   Glucose-Capillary 446 (H) 65 - 99 mg/dL   Comment 1 Notify RN    Comment 2 Document in Chart   Troponin I     Status: Abnormal   Collection Time: 05/24/15 12:34 PM  Result Value Ref Range   Troponin I 0.06 (H) <0.031 ng/mL    Comment: PREVIOUS RESULT CALLED CASEY PIERCE AT 1447 ON 05/23/15 BY VAB.VAB        PERSISTENTLY INCREASED TROPONIN VALUES IN THE RANGE OF 0.04-0.49 ng/mL CAN BE SEEN IN:       -UNSTABLE ANGINA       -CONGESTIVE HEART FAILURE       -MYOCARDITIS       -CHEST TRAUMA       -ARRYHTHMIAS       -LATE PRESENTING MYOCARDIAL INFARCTION       -COPD   CLINICAL FOLLOW-UP RECOMMENDED.   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 Mild cardiomegaly. Atherosclerosis in the transverse aorta. No pleural effusion or pneumothorax. similar interstitial  prominence and indistinctness. No lobar consolidation. IMPRESSION: Mild congestive heart failure. Aortic atherosclerosis. Electronically Signed   By: Jeronimo Greaves M.D.   On: 05/23/2015 13:44   Ct Angio Chest Pe W/cm &/or Wo Cm  05/23/2015  CLINICAL DATA:  Left arm pain and shortness of breath since last night. EXAM: CT ANGIOGRAPHY CHEST WITH CONTRAST TECHNIQUE: Multidetector CT imaging of the chest was performed using the standard protocol during bolus administration of intravenous contrast. Multiplanar CT image reconstructions and MIPs were obtained to evaluate the vascular anatomy. CONTRAST:  65mL OMNIPAQUE IOHEXOL 350 MG/ML SOLN COMPARISON:  Radiographs 05/23/2015 and 05/01/2015.  CT 12/11/2013. FINDINGS: Mediastinum: The pulmonary arteries are well opacified with contrast. There is no evidence of acute pulmonary embolism. There is atherosclerosis of the aorta, great vessels and coronary arteries.Right paratracheal node measuring 12 mm short axis on image 31 is stable. No other enlarged mediastinal or hilar lymph nodes are identified. The heart size is normal. There is a small amount pericardial fluid. The thyroid gland, trachea and esophagus demonstrate no significant findings. Lungs/Pleura: There is no pleural effusion.Moderate centrilobular emphysema with scattered subpleural blebs. There are linear densities in the lingula and right middle lobe (best seen on the reformatted images). No recurrent airspace disease, suspicious pulmonary nodule or endobronchial lesion. Upper abdomen: There is reflux of contrasted blood into the IVC and hepatic veins. The visualized upper abdomen otherwise appears unchanged. Musculoskeletal/Chest wall: No chest wall lesion or acute osseous findings.Prominent sclerosis in the mid thoracic spine is again noted, slightly progressive and likely discogenic in origin. Review of the MIP images confirms the above findings. IMPRESSION: 1. No evidence of acute pulmonary embolism or other  acute chest process. 2. Chronic lung disease with emphysema and linear scarring in the right middle lobe and lingula. 3. Stable right paratracheal lymph node. 4. Moderate atherosclerosis. 5. Reflux into the IVC and hepatic veins, suggesting right heart failure. 6. Mildly progressive discogenic sclerosis in the thoracic spine. Electronically Signed   By: Richardean Sale M.D.   On: 05/23/2015 17:43    Review of Systems  Constitutional: Positive for malaise/fatigue.  HENT: Positive for congestion.   Eyes: Negative.   Respiratory: Positive for shortness of breath.   Cardiovascular: Positive for chest pain and orthopnea.  Gastrointestinal: Positive for heartburn.  Genitourinary: Negative.   Musculoskeletal: Negative.   Skin: Negative.   Neurological: Positive for weakness.  Endo/Heme/Allergies: Negative.   Psychiatric/Behavioral: Negative.    Blood pressure 119/77, pulse 83, temperature 97.7 F (36.5 C), temperature source Oral, resp. rate 23, height '5\' 4"'$  (1.626 m), weight 85.276 kg (188 lb), SpO2 97 %. Physical Exam  Nursing note and vitals reviewed. Constitutional: She is oriented to person, place, and time. She appears well-developed and well-nourished.  HENT:  Head: Normocephalic and atraumatic.  Eyes: Conjunctivae and EOM are normal. Pupils are equal, round, and reactive to light.  Neck: Normal range of motion. Neck supple.  Cardiovascular: Normal rate and regular rhythm.   Respiratory: Effort normal and breath sounds normal.  GI: Soft. Bowel sounds are normal.  Musculoskeletal: Normal range of motion.  Neurological: She is alert and oriented to person, place, and time. She has normal reflexes.  Skin: Skin is warm and dry.  Psychiatric: She has a normal mood and affect. Her behavior is normal.    Assessment/Plan:  unstable angina  chest pain  hypertension  smoking  congestive heart failure  angina GERD  shortness of breath  diabetes  COPD  obstructive sleep  Apnea . PLAN  agree  with myocardial infarction  continued telemetry  consider supplemental oxygen as necessary  aspirin therapy  continue Coreg Lasix Avapro  Crestor for hyperlipidemia  continue inhaler therapy  diabetes management control with insulin  Protonix therapy for reflux symptoms  Lasix therapy for heart failure symptoms  agree with antibiotic therapy for possible bronchitis  advice to refrain from tobacco abuse  echocardiogram for assessment of overall left ventricular function  Lexis scan Myoview for evaluation of ischemia    CALLWOOD,DWAYNE D. 05/24/2015, 4:44 PM

## 2015-05-24 NOTE — Progress Notes (Signed)
Patient's blood sugar is 446. Dr. Amado Coe paged and stated to give 15 units of novolog and recheck CBG in one hour.

## 2015-05-24 NOTE — Progress Notes (Signed)
Notified Dr. Amado Coe that patient's blood sugar is still 423. No new orders at this time.

## 2015-05-24 NOTE — Progress Notes (Signed)
Patient has refused cpap for the night. c3 auto cpap in room.

## 2015-05-24 NOTE — Progress Notes (Signed)
Inpatient Diabetes Program Recommendations  AACE/ADA: New Consensus Statement on Inpatient Glycemic Control (2015)  Target Ranges:  Prepandial:   less than 140 mg/dL      Peak postprandial:   less than 180 mg/dL (1-2 hours)      Critically ill patients:  140 - 180 mg/dL   Review of Glycemic Control  Results for Alexis Ray, Alexis Ray (MRN [id number]) as of 05/24/2015 10:03  Ref. Range 05/23/2015 15:12 05/23/2015 22:34 05/24/2015 07:58  Glucose-Capillary Latest Ref Range: 65-99 mg/dL 68 87 086 (H)    Diabetes history: Type 2 Outpatient Diabetes medications: Novolog 10 units tid with meals, Victoza 1.8mg /week, Metformin  bid Current orders for Inpatient glycemic control: Novolog 0-9 units tid with meals, Novolog 0-5 units qhs,  *prednisone  tid  Please consider starting basal insulin Lantus 13 units qhs (0.15units/kg), and increasing Novolog correction to 0-15 units tid since patient is on steroids (decrease insulin as steroids are titrated)  [name], RN, BA, MHA, CDE Diabetes Coordinator Inpatient Diabetes Program  [phone] (Team Pager) [phone] [facility] Office) 05/24/2015 10:08 AM   Inpatient Diabetes Program Recommendations:

## 2015-05-24 NOTE — Progress Notes (Signed)
SATURATION QUALIFICATIONS: (This note is used to comply with regulatory documentation for home oxygen)  Patient Saturations on Room Air at Rest = 90%  Patient Saturations on Room Air while Ambulating = 93%  Patient Saturations on Liters of oxygen while Ambulating = N/A  Please briefly explain why patient needs home oxygen: Patient wears oxygen chronically at night time and has been wearing it for comfort during the day in the hospital. Does not need the oxygen during the day.

## 2015-05-25 LAB — BASIC METABOLIC PANEL
Anion gap: 9 (ref 5–15)
BUN: 25 mg/dL — AB (ref 6–20)
CHLORIDE: 98 mmol/L — AB (ref 101–111)
CO2: 28 mmol/L (ref 22–32)
Calcium: 8.5 mg/dL — ABNORMAL LOW (ref 8.9–10.3)
Creatinine, Ser: 1.15 mg/dL — ABNORMAL HIGH (ref 0.44–1.00)
GFR calc Af Amer: 60 mL/min — ABNORMAL LOW (ref >60)
GFR, EST NON AFRICAN AMERICAN: 51 mL/min — AB (ref >60)
GLUCOSE: 200 mg/dL — AB (ref 65–99)
POTASSIUM: 3.7 mmol/L (ref 3.5–5.1)
Sodium: 135 mmol/L (ref 135–145)

## 2015-05-25 LAB — CBC
HEMATOCRIT: 37.5 % (ref 35.0–47.0)
Hemoglobin: 11.5 g/dL — ABNORMAL LOW (ref 12.0–16.0)
MCH: 22.4 pg — AB (ref 26.0–34.0)
MCHC: 30.7 g/dL — ABNORMAL LOW (ref 32.0–36.0)
MCV: 72.9 fL — AB (ref 80.0–100.0)
PLATELETS: 210 10*3/uL (ref 150–440)
RBC: 5.15 MIL/uL (ref 3.80–5.20)
RDW: 18.3 % — ABNORMAL HIGH (ref 11.5–14.5)
WBC: 12.9 10*3/uL — ABNORMAL HIGH (ref 3.6–11.0)

## 2015-05-25 LAB — GLUCOSE, CAPILLARY
GLUCOSE-CAPILLARY: 198 mg/dL — AB (ref 65–99)
GLUCOSE-CAPILLARY: 432 mg/dL — AB (ref 65–99)
GLUCOSE-CAPILLARY: 461 mg/dL — AB (ref 65–99)
GLUCOSE-CAPILLARY: 403 mg/dL — AB (ref 65–99)
GLUCOSE-CAPILLARY: 383 mg/dL — AB (ref 65–99)

## 2015-05-25 MED ORDER — INSULIN DETEMIR 100 UNIT/ML ~~LOC~~ SOLN
5.0000 [IU] | Freq: Once | SUBCUTANEOUS | Status: AC
  Administered 2015-05-25: 5 [IU] via SUBCUTANEOUS
  Filled 2015-05-25: qty 0.05

## 2015-05-25 MED ORDER — AZITHROMYCIN 250 MG PO TABS
500.0000 mg | ORAL_TABLET | Freq: Every day | ORAL | Status: DC
  Administered 2015-05-25: 500 mg via ORAL
  Filled 2015-05-25: qty 2

## 2015-05-25 MED ORDER — BISACODYL 5 MG PO TBEC
10.0000 mg | DELAYED_RELEASE_TABLET | Freq: Every day | ORAL | Status: DC
  Administered 2015-05-25 – 2015-05-26 (×2): 10 mg via ORAL
  Filled 2015-05-25 (×2): qty 2

## 2015-05-25 MED ORDER — FUROSEMIDE 10 MG/ML IJ SOLN
40.0000 mg | Freq: Two times a day (BID) | INTRAMUSCULAR | Status: DC
  Administered 2015-05-25 – 2015-05-26 (×2): 40 mg via INTRAVENOUS
  Filled 2015-05-25 (×2): qty 4

## 2015-05-25 MED ORDER — INSULIN GLARGINE 100 UNIT/ML ~~LOC~~ SOLN
26.0000 [IU] | Freq: Every day | SUBCUTANEOUS | Status: DC
  Administered 2015-05-25: 26 [IU] via SUBCUTANEOUS
  Filled 2015-05-25 (×2): qty 0.26

## 2015-05-25 MED ORDER — INSULIN ASPART 100 UNIT/ML ~~LOC~~ SOLN
10.0000 [IU] | Freq: Three times a day (TID) | SUBCUTANEOUS | Status: DC
  Administered 2015-05-25 – 2015-05-26 (×3): 10 [IU] via SUBCUTANEOUS
  Filled 2015-05-25 (×3): qty 10

## 2015-05-25 MED ORDER — INSULIN ASPART 100 UNIT/ML ~~LOC~~ SOLN
18.0000 [IU] | Freq: Once | SUBCUTANEOUS | Status: AC
  Administered 2015-05-25: 18 [IU] via SUBCUTANEOUS

## 2015-05-25 NOTE — Progress Notes (Signed)
Pt ambulated around unit on RA - sats 89% Pt ambulated around unit on 2L [place] - sats 97% sats on RA at rest is 94%

## 2015-05-25 NOTE — Care Management Important Message (Signed)
Important Message  Patient Details  Name: HETAL PROANO MRN: [id number] Date of Birth: 16-Nov-1955   Medicare Important Message Given:  Yes    Luismario Coston A, RN 05/25/2015, 8:07 AM

## 2015-05-25 NOTE — Care Management Note (Signed)
Case Management Note  Patient Details  Name: Alexis Ray MRN: [id number] Date of Birth: Apr 06, 1956  Subjective/Objective:       59yo Ms [name] was admitted 05/23/15 with AMS and weakness. Dx: CHF and COPD exacerbation. Hx: COPD, CHF, CPAP at bedtime for OSA. Resides at home with her daughter [name]. PCP=Dr. Nooraima.  Pharmacy=[facility] in [place]. Has a rolling walker at home. Is on 3L N/C at night only which is supplied by [facility]. Drives herself to her appointments. Chose [facility] from a list of local home health providers if she needs home health after discharge. Case management will follow for discharge planning.             Action/Plan:   Expected Discharge Date:                  Expected Discharge Plan:     In-House Referral:     Discharge planning Services     Post Acute Care Choice:    Choice offered to:     DME Arranged:    DME Agency:     HH Arranged:    HH Agency:     Status of Service:     Medicare Important Message Given:  Yes Date Medicare IM Given:    Medicare IM give by:    Date Additional Medicare IM Given:    Additional Medicare Important Message give by:     If discussed at Long Length of Stay Meetings, dates discussed:    Additional Comments:  Euell Schiff A, RN 05/25/2015, 3:05 PM

## 2015-05-25 NOTE — Progress Notes (Signed)
Initial appointment made at [facility] on June 07, 2015 at 11:00am. Thank you.

## 2015-05-25 NOTE — Progress Notes (Signed)
Dr. Amado Coe notified of patients blood sugar level of 432. MD acknowledged, ordered a second insulin to go with novolog.

## 2015-05-25 NOTE — Progress Notes (Signed)
Initial Nutrition Assessment    INTERVENTION:   Meals and Snacks: Cater to patient preferences Education: pt verbalized understanding of heart healthy/carb modified diet; pt declines further education at this time. Reassess for education on follow-up   NUTRITION DIAGNOSIS:   Inadequate oral intake related to poor appetite (early satiety) as evidenced by per patient/family report.  GOAL:   Patient will meet greater than or equal to 90% of their needs  MONITOR:    (Energy Intake, Digestive system, Anthropometrics, Electrolyte/Renal Profile)  REASON FOR ASSESSMENT:   Malnutrition Screening Tool    ASSESSMENT:    Pt admitted with acute on chronic CHF, COPD exacerbation   Past Medical History  Diagnosis Date  . Diabetes mellitus without complication (HCC)   . Hypertension   . Chronic combined systolic and diastolic CHF (congestive heart failure) (HCC)   . Stroke (cerebrum) (HCC)   . COPD (chronic obstructive pulmonary disease) (HCC)   . GERD (gastroesophageal reflux disease)   . PVD (peripheral vascular disease) (HCC)   . CAD (coronary artery disease)     Diet Order:  Diet heart healthy/carb modified Room service appropriate?: Yes; Fluid consistency:: Thin   Energy Intake: recorded po intake 100% of meals, pt reports good appetite at present, eating well  Food and nutrition related history: pt reports poor appetite for a while at home, pt reports abdominal swelling which has been an on-going issue at home; this prevented her from eating too much, if she would eat too much it would feel uncomfortable, pt would sometimes throw up. Pt reports a typical meal might be 1/2 sandwich, part of a hot dog.   Skin:  Reviewed, no issues  Electrolyte and Renal Profile:  Recent Labs Lab 05/23/15 1307 05/24/15 0546 05/25/15 0609  BUN 15 25* 25*  CREATININE 1.13* 1.19* 1.15*  NA 139 139 135  K 3.7 4.5 3.7   Glucose Profile:  Recent Labs  05/24/15 2031 05/25/15 0723  05/25/15 1141  GLUCAP 311* 198* 432*   Meds: ss novolog, lasix, lantus, levemir, solumedrol  Nutrition focused Physical Exam: Nutrition-Focused physical exam completed. Findings are WDL for fat depletion, muscle depletion, and edema.   Height:   Ht Readings from Last 1 Encounters:  05/23/15  (1.626 m)    Weight: pt reports UBW between 200-212 pounds but pt is unsure when she last weighed this. 3% wt loss since September per weight encounters  Wt Readings from Last 1 Encounters:  05/25/15 188 lb 8 oz (85.503 kg)   Wt Readings from Last 10 Encounters:  05/25/15 188 lb 8 oz (85.503 kg)  05/01/15 192 lb (87.091 kg)  01/21/15 194 lb 7.1 oz (88.2 kg)    BMI:  Body mass index is 32.34 kg/(m^2).  LOW Care Level  Romelle Starcher MS, [place], LDN [phone] Pager  [phone] Weekend/On-Call Pager

## 2015-05-25 NOTE — Progress Notes (Signed)
[facility] - [place] at [facility]   PATIENT NAME: Alexis Ray    MR#:  [id number]  DATE OF BIRTH:  1956/01/09  SUBJECTIVE:  CHIEF COMPLAINT:  Pt s sob is better , denies any wheezing but admits cough   REVIEW OF SYSTEMS:  CONSTITUTIONAL: No fever, fatigue or weakness.  EYES: No blurred or double vision.  EARS, NOSE, AND THROAT: No tinnitus or ear pain.  RESPIRATORY: Has cough, improving shortness of breath, no wheezing or hemoptysis.  CARDIOVASCULAR: No chest pain, orthopnea, edema.  GASTROINTESTINAL: No nausea, vomiting, diarrhea or abdominal pain.  GENITOURINARY: No dysuria, hematuria.  ENDOCRINE: No polyuria, nocturia,  HEMATOLOGY: No anemia, easy bruising or bleeding SKIN: No rash or lesion. MUSCULOSKELETAL: No joint pain or arthritis.   NEUROLOGIC: No tingling, numbness, weakness.  PSYCHIATRY: No anxiety or depression.   DRUG ALLERGIES:  No Known Allergies  VITALS:  Blood pressure 116/79, pulse 79, temperature 97.4 F (36.3 C), temperature source Oral, resp. rate 20, height  (1.626 m), weight 85.503 kg (188 lb 8 oz), SpO2 93 %.  PHYSICAL EXAMINATION:  GENERAL:  60 y.o.-year-old patient lying in the bed with no acute distress.  EYES: Pupils equal, round, reactive to light and accommodation. No scleral icterus. Extraocular muscles intact.  HEENT: Head atraumatic, normocephalic. Oropharynx and nasopharynx clear.  NECK:  Supple, no jugular venous distention. No thyroid enlargement, no tenderness.  LUNGS: Coarse  breath sounds bilaterally, diffusely wheezing, minimal rales, no rhonchi or crepitation. No use of accessory muscles of respiration.  CARDIOVASCULAR: S1, S2 normal. No murmurs, rubs, or gallops.  ABDOMEN: Soft, nontender, nondistended. Bowel sounds present. No organomegaly or mass.  EXTREMITIES: No pedal edema, cyanosis, or clubbing.  NEUROLOGIC: Cranial nerves II through XII are intact. Muscle strength 5/5 in all extremities.  Sensation intact. Gait not checked.  PSYCHIATRIC: The patient is alert and oriented x 3.  SKIN: No obvious rash, lesion, or ulcer.    LABORATORY PANEL:   CBC  Recent Labs Lab 05/25/15 0609  WBC 12.9*  HGB 11.5*  HCT 37.5  PLT 210   ------------------------------------------------------------------------------------------------------------------  Chemistries   Recent Labs Lab 05/25/15 0609  NA 135  K 3.7  CL 98*  CO2 28  GLUCOSE 200*  BUN 25*  CREATININE 1.15*  CALCIUM 8.5*   ------------------------------------------------------------------------------------------------------------------  Cardiac Enzymes  Recent Labs Lab 05/24/15 1234  TROPONINI 0.06*   ------------------------------------------------------------------------------------------------------------------  RADIOLOGY:  Ct Angio Chest Pe W/cm &/or Wo Cm  05/23/2015  CLINICAL DATA:  Left arm pain and shortness of breath since last night. EXAM: CT ANGIOGRAPHY CHEST WITH CONTRAST TECHNIQUE: Multidetector CT imaging of the chest was performed using the standard protocol during bolus administration of intravenous contrast. Multiplanar CT image reconstructions and MIPs were obtained to evaluate the vascular anatomy. CONTRAST:  80mL OMNIPAQUE IOHEXOL 350 MG/ML SOLN COMPARISON:  Radiographs 05/23/2015 and 05/01/2015.  CT 12/11/2013. FINDINGS: Mediastinum: The pulmonary arteries are well opacified with contrast. There is no evidence of acute pulmonary embolism. There is atherosclerosis of the aorta, great vessels and coronary arteries.Right paratracheal node measuring 12 mm short axis on image 31 is stable. No other enlarged mediastinal or hilar lymph nodes are identified. The heart size is normal. There is a small amount pericardial fluid. The thyroid gland, trachea and esophagus demonstrate no significant findings. Lungs/Pleura: There is no pleural effusion.Moderate centrilobular emphysema with scattered subpleural  blebs. There are linear densities in the lingula and right middle lobe (best seen on the reformatted images). No recurrent airspace  disease, suspicious pulmonary nodule or endobronchial lesion. Upper abdomen: There is reflux of contrasted blood into the IVC and hepatic veins. The visualized upper abdomen otherwise appears unchanged. Musculoskeletal/Chest wall: No chest wall lesion or acute osseous findings.Prominent sclerosis in the mid thoracic spine is again noted, slightly progressive and likely discogenic in origin. Review of the MIP images confirms the above findings. IMPRESSION: 1. No evidence of acute pulmonary embolism or other acute chest process. 2. Chronic lung disease with emphysema and linear scarring in the right middle lobe and lingula. 3. Stable right paratracheal lymph node. 4. Moderate atherosclerosis. 5. Reflux into the IVC and hepatic veins, suggesting right heart failure. 6. Mildly progressive discogenic sclerosis in the thoracic spine. Electronically Signed   By: Carey Bullocks M.D.   On: 05/23/2015 17:43    EKG:   Orders placed or performed during the hospital encounter of 05/23/15  . ED EKG within 10 minutes  . ED EKG within 10 minutes    ASSESSMENT AND PLAN:   # Acute on chronic systolic  CHF (congestive heart failure) (HCC) - Continue  IV Lasix ; weight is 85.5 kg we'll continue CPAP daily at bedtime for OSA, but may also help some with her pulmonary edema.  Monitor I's and O's  Repeat echocardiogram. LV ejection fraction at 30% and pulmonary hypertension  Continue home meds  Cardio is recommending stress test- outpatient, will follow up with [facility] cardiology Continue patient's aspirin, beta blocker Coreg and ARB . Continue statin  2.COPD exacerbation (HCC) -  continue home inhalers, IV steroids, DuoNeb's when necessary, azithromycin, sputum culture if possible to obtain . 3 Type 2 diabetes mellitus (HCC) - sliding scale insulin and appropriate glucose checks with  carb modified diet  4 HTN (hypertension) - continue home meds  5 PVD (peripheral vascular disease) (HCC) - continue home meds  6. OSA on CPAP - CPAP daily at bedtime  7. GERD (gastroesophageal reflux disease) - continue home meds  8. Tobaco abuse  counselled pt to quit smoking  For 3-5 mmin Nicotine patch , pt is considering to quit smoking      All the records are reviewed and case discussed with Care Management/Social Workerr. Management plans discussed with the patient, family and they are in agreement.  CODE STATUS: fc   TOTAL TIME TAKING CARE OF THIS PATIENT: 35  minutes.   POSSIBLE D/C IN 1-2  DAYS, DEPENDING ON CLINICAL CONDITION.   Ramonita Lab M.D on 05/25/2015 at 2:45 PM  Between 7am to 6pm - Pager - [phone] After 6pm go to [email or web address] - password [email or web address] [facility]  [place] [facility]  Office  [phone]  CC: Primary care physician; No primary care provider on file.

## 2015-05-25 NOTE — Progress Notes (Signed)
Inpatient Diabetes Program Recommendations  AACE/ADA: New Consensus Statement on Inpatient Glycemic Control (2015)  Target Ranges:  Prepandial:   less than 140 mg/dL      Peak postprandial:   less than 180 mg/dL (1-2 hours)      Critically ill patients:  140 - 180 mg/dL   Review of Glycemic Control  Results for Alexis Ray, Alexis Ray (MRN [id number]) as of 05/25/2015 14:36  Ref. Range 05/24/2015 12:54 05/24/2015 16:27 05/24/2015 20:31 05/25/2015 07:23 05/25/2015 11:41  Glucose-Capillary Latest Ref Range: 65-99 mg/dL 956 (H) 213 (H) 086 (H) 198 (H) 432 (H)    Diabetes history: Type 2 Outpatient Diabetes medications: Novolog 10 units tid with meals, Victoza 1.8mg /week, Metformin  bid Current orders for Inpatient glycemic control: Novolog 0-15 units tid with meals, Novolog 0-5 units qhs, Lantus 13units qday *prednisone  tid  Please consider increasing basal insulin Lantus 26 units qhs (0.30units/kg), and adding Novolog 10 units tid with meals.  [name], RN, BA, MHA, CDE Diabetes Coordinator Inpatient Diabetes Program  [phone] (Team Pager) [phone] [facility] Office) 05/25/2015 2:38 PM

## 2015-05-26 LAB — BASIC METABOLIC PANEL
ANION GAP: 6 (ref 5–15)
BUN: 30 mg/dL — ABNORMAL HIGH (ref 6–20)
CALCIUM: 8.8 mg/dL — AB (ref 8.9–10.3)
CO2: 29 mmol/L (ref 22–32)
Chloride: 103 mmol/L (ref 101–111)
Creatinine, Ser: 1.03 mg/dL — ABNORMAL HIGH (ref 0.44–1.00)
GFR, EST NON AFRICAN AMERICAN: 59 mL/min — AB (ref >60)
GLUCOSE: 246 mg/dL — AB (ref 65–99)
POTASSIUM: 4.2 mmol/L (ref 3.5–5.1)
Sodium: 138 mmol/L (ref 135–145)

## 2015-05-26 LAB — GLUCOSE, CAPILLARY
GLUCOSE-CAPILLARY: 244 mg/dL — AB (ref 65–99)
Glucose-Capillary: 182 mg/dL — ABNORMAL HIGH (ref 65–99)
Glucose-Capillary: 278 mg/dL — ABNORMAL HIGH (ref 65–99)

## 2015-05-26 MED ORDER — AZITHROMYCIN 250 MG PO TABS: 250.0000 mg | ORAL_TABLET | Freq: Every day | ORAL | Status: DC

## 2015-05-26 MED ORDER — FLUCONAZOLE 100 MG PO TABS
100.0000 mg | ORAL_TABLET | Freq: Once | ORAL | Status: AC
  Administered 2015-05-26: 100 mg via ORAL
  Filled 2015-05-26: qty 1

## 2015-05-26 MED ORDER — PREDNISONE 10 MG (21) PO TBPK: ORAL_TABLET | ORAL | Status: DC

## 2015-05-26 MED ORDER — ALBUTEROL SULFATE (2.5 MG/3ML) 0.083% IN NEBU: 2.5000 mg | INHALATION_SOLUTION | Freq: Four times a day (QID) | RESPIRATORY_TRACT | Status: AC | PRN

## 2015-05-26 NOTE — Discharge Summary (Addendum)
[facility] - [place] at [facility]   PATIENT NAME: Alexis Ray    MR#:  [id number]  DATE OF BIRTH:  [birth year]  DATE OF ADMISSION:  05/23/2015 ADMITTING PHYSICIAN: Oralia Manis, MD  DATE OF DISCHARGE: 05/26/2015  PRIMARY CARE PHYSICIAN: No primary care provider on file.    ADMISSION DIAGNOSIS:  SOB (shortness of breath) [R06.02] Acute on chronic congestive heart failure, unspecified congestive heart failure type (HCC) [I50.9] Chronic obstructive pulmonary disease, unspecified COPD type (HCC) [J44.9]  DISCHARGE DIAGNOSIS:  Principal Problem:   Acute on chronic combined systolic and diastolic CHF (congestive heart failure) (HCC) Active Problems:   COPD exacerbation (HCC)   GERD (gastroesophageal reflux disease)   Type 2 diabetes mellitus (HCC)   HTN (hypertension)   PVD (peripheral vascular disease) (HCC)   OSA on CPAP   SECONDARY DIAGNOSIS:   Past Medical History  Diagnosis Date  . Diabetes mellitus without complication (HCC)   . Hypertension   . Chronic combined systolic and diastolic CHF (congestive heart failure) (HCC)   . Stroke (cerebrum) (HCC)   . COPD (chronic obstructive pulmonary disease) (HCC)   . GERD (gastroesophageal reflux disease)   . PVD (peripheral vascular disease) (HCC)   . CAD (coronary artery disease)     HOSPITAL COURSE:   # Acute on chronic systolic CHF (congestive heart failure) (HCC) - Continue IV Lasix ; weight is 85.5 kg we'll continue CPAP daily at bedtime for OSA,  also help some with her pulmonary edema. Monitor I's and O's Repeat echocardiogram. LV ejection fraction at 30% and pulmonary hypertension  Continue home meds  Cardio is recommending stress test- outpatient, will follow up with [facility] cardiology Continue patient's aspirin, beta blocker Coreg and ARB . Continue statin   Councelled her about fluid restriction, and given follow up in heart failure clinic.  2.COPD exacerbation (HCC) - continue  home inhalers, IV steroids, DuoNeb's when necessary, azithromycin,    Swich to oral steroids on d/c.  3 Type 2 diabetes mellitus (HCC) - sliding scale insulin and appropriate glucose checks with carb modified diet  resume home meds on d/c.  4 HTN (hypertension) - continue home meds  5 PVD (peripheral vascular disease) (HCC) - continue home meds  6. OSA on CPAP - CPAP daily at bedtime  7. GERD (gastroesophageal reflux disease) - continue home meds  8. Tobaco abuse  counselled pt to quit smoking For 3-5 mmin Nicotine patch , pt is considering to quit smoking    DISCHARGE CONDITIONS:   Stable.  CONSULTS OBTAINED:  Treatment Team:  Alwyn Pea, MD  DRUG ALLERGIES:  No Known Allergies  DISCHARGE MEDICATIONS:   Current Discharge Medication List    START taking these medications   Details  albuterol (PROVENTIL) (2.5 MG/3ML) 0.083% nebulizer solution Take 3 mLs (2.5 mg total) by nebulization every 6 (six) hours as needed for wheezing or shortness of breath. Qty: 75 mL, Refills: 2    azithromycin (ZITHROMAX) 250 MG tablet Take 1 tablet (250 mg total) by mouth daily. Qty: 2 each, Refills: 0    predniSONE (STERAPRED UNI-PAK 21 TAB) 10 MG (21) TBPK tablet Take 6 tabs first day, 5 tab on day 2, then 4 on day 3rd, 3 tabs on day 4th , 2 tab on day 5th, and 1 tab on 6th day. Qty: 21 tablet, Refills: 0      CONTINUE these medications which have NOT CHANGED   Details  aspirin EC 81 MG tablet Take 81 mg by  mouth daily.    budesonide-formoterol (SYMBICORT) 160-4.5 MCG/ACT inhaler Inhale 2 puffs into the lungs 2 (two) times daily.    carvedilol (COREG) 12.5 MG tablet Take 12.5 mg by mouth 2 (two) times daily.    cholecalciferol (VITAMIN D) 1000 units tablet Take 1,000 Units by mouth daily.    cilostazol (PLETAL) 100 MG tablet Take 100 mg by mouth 2 (two) times daily.    esomeprazole (NEXIUM) 40 MG capsule Take 40 mg by mouth daily at 12 noon.    furosemide (LASIX)  40 MG tablet Take 1 tablet (40 mg total) by mouth 2 (two) times daily. Qty: 60 tablet, Refills: 0    insulin aspart (NOVOLOG) 100 UNIT/ML injection Inject 10 Units into the skin 3 (three) times daily with meals.    levocetirizine (XYZAL) 5 MG tablet Take 5 mg by mouth at bedtime.     metFORMIN (GLUCOPHAGE) 1000 MG tablet Take 1,000 mg by mouth 2 (two) times daily with a meal.    nitroGLYCERIN (NITROSTAT) 0.4 MG SL tablet Place 0.4 mg under the tongue every 5 (five) minutes as needed for chest pain.    pregabalin (LYRICA) 150 MG capsule Take 150 mg by mouth 2 (two) times daily.    rosuvastatin (CRESTOR) 40 MG tablet Take 40 mg by mouth daily.    spironolactone (ALDACTONE) 50 MG tablet Take 50 mg by mouth daily.    tiotropium (SPIRIVA) 18 MCG inhalation capsule Place 18 mcg into inhaler and inhale daily.    valsartan (DIOVAN) 40 MG tablet Take 40 mg by mouth 2 (two) times daily.    Venlafaxine HCl 225 MG TB24 Take 225 mg by mouth daily.     vitamin B-12 (CYANOCOBALAMIN) 1000 MCG tablet Take 1,000 mcg by mouth daily.    vitamin C (ASCORBIC ACID) 500 MG tablet Take 500 mg by mouth 2 (two) times daily.      STOP taking these medications     Liraglutide (VICTOZA) 18 MG/3ML SOPN          DISCHARGE INSTRUCTIONS:    Follow with cardiology clinic in 2 weeks to schedule stress test. Follow with heart failure clinic.  If you experience worsening of your admission symptoms, develop shortness of breath, life threatening emergency, suicidal or homicidal thoughts you must seek medical attention immediately by calling 911 or calling your MD immediately  if symptoms less severe.  You Must read complete instructions/literature along with all the possible adverse reactions/side effects for all the Medicines you take and that have been prescribed to you. Take any new Medicines after you have completely understood and accept all the possible adverse reactions/side effects.   Please  note  You were cared for by a hospitalist during your hospital stay. If you have any questions about your discharge medications or the care you received while you were in the hospital after you are discharged, you can call the unit and asked to speak with the hospitalist on call if the hospitalist that took care of you is not available. Once you are discharged, your primary care physician will handle any further medical issues. Please note that NO REFILLS for any discharge medications will be authorized once you are discharged, as it is imperative that you return to your primary care physician (or establish a relationship with a primary care physician if you do not have one) for your aftercare needs so that they can reassess your need for medications and monitor your lab values.    Today   CHIEF  COMPLAINT:   Chief Complaint  Patient presents with  . Shortness of Breath  . Chest Pain    HISTORY OF PRESENT ILLNESS:  Alexis Ray  is a 60 y.o. female with a known history of CHF and COPD. patient states that she began feeling short of breath 2-3 days ago. These symptoms have progressed since that time. Her standard home medications have not improved her symptoms. Evaluation here in the ED showed elevated BNP, though not terribly above her baseline. Chest x-ray showed mild congestive heart failure. Hospitalists were called for admission for exacerbation of heart failure as well as COPD.   VITAL SIGNS:  Blood pressure 134/69, pulse 78, temperature 98.2 F (36.8 C), temperature source Oral, resp. rate 18, height  (1.626 m), weight 84.46 kg (186 lb 3.2 oz), SpO2 93 %.  I/O:    Intake/Output Summary (Last 24 hours) at 05/26/15 1502 Last data filed at 05/26/15 1407  Gross per 24 hour  Intake    720 ml  Output   5050 ml  Net  -4330 ml    PHYSICAL EXAMINATION:   GENERAL: 60 y.o.-year-old patient lying in the bed with no acute distress.  EYES: Pupils equal, round, reactive to light  and accommodation. No scleral icterus. Extraocular muscles intact.  HEENT: Head atraumatic, normocephalic. Oropharynx and nasopharynx clear.  NECK: Supple, no jugular venous distention. No thyroid enlargement, no tenderness.  LUNGS: Coarse breath sounds bilaterally, diffusely wheezing, minimal rales, no rhonchi or crepitation. No use of accessory muscles of respiration.  CARDIOVASCULAR: S1, S2 normal. No murmurs, rubs, or gallops.  ABDOMEN: Soft, nontender, nondistended. Bowel sounds present. No organomegaly or mass.  EXTREMITIES: No pedal edema, cyanosis, or clubbing.  NEUROLOGIC: Cranial nerves II through XII are intact. Muscle strength 5/5 in all extremities. Sensation intact. Gait not checked.  PSYCHIATRIC: The patient is alert and oriented x 3.  SKIN: No obvious rash, lesion, or ulcer.    DATA REVIEW:   CBC  Recent Labs Lab 05/25/15 0609  WBC 12.9*  HGB 11.5*  HCT 37.5  PLT 210    Chemistries   Recent Labs Lab 05/26/15 0608  NA 138  K 4.2  CL 103  CO2 29  GLUCOSE 246*  BUN 30*  CREATININE 1.03*  CALCIUM 8.8*    Cardiac Enzymes  Recent Labs Lab 05/24/15 1234  TROPONINI 0.06*    Microbiology Results  Results for orders placed or performed during the hospital encounter of 05/23/15  Culture, expectorated sputum-assessment     Status: None   Collection Time: 05/24/15  5:32 PM  Result Value Ref Range Status   Specimen Description EXPECTORATED SPUTUM  Final   Special Requests NONE  Final   Sputum evaluation THIS SPECIMEN IS ACCEPTABLE FOR SPUTUM CULTURE  Final   Report Status 05/24/2015 FINAL  Final  Culture, respiratory (NON-Expectorated)     Status: None (Preliminary result)   Collection Time: 05/24/15  5:32 PM  Result Value Ref Range Status   Specimen Description EXPECTORATED SPUTUM  Final   Special Requests NONE Reflexed from [id number]  Final   Gram Stain   Final    FAIR SPECIMEN - 70-80% WBCS FEW WBC SEEN MANY GRAM POSITIVE COCCI RARE GRAM  NEGATIVE RODS    Culture Consistent with normal respiratory flora.  Final   Report Status PENDING  Incomplete    RADIOLOGY:  No results found.  EKG:   Orders placed or performed during the hospital encounter of 05/23/15  . ED EKG within 10 minutes  .  ED EKG within 10 minutes      Management plans discussed with the patient, family and they are in agreement.  CODE STATUS:     Code Status Orders        Start     Ordered   05/23/15 2257  Full code   Continuous     05/23/15 2256    Code Status History    Date Active Date Inactive Code Status Order ID Comments User Context   This patient has a current code status but no historical code status.      TOTAL TIME TAKING CARE OF THIS PATIENT: 35 minutes.    Altamese Dilling M.D on 05/26/2015 at 3:02 PM  Between 7am to 6pm - Pager - [phone]  After 6pm go to [email or web address] - password [email or web address] [facility]  [place] [facility]  Office  [phone]  CC: Primary care physician; No primary care provider on file.   Note: This dictation was prepared with Dragon dictation along with smaller phrase technology. Any transcriptional errors that result from this process are unintentional.

## 2015-05-26 NOTE — Progress Notes (Signed)
Offered to place pt on CPAP for sleep. Pt is watching TV with all lights on and states that she is not sleepy yet and isn't ready to go on CPAP. Encouraged pt to call when she is ready to go to sleep

## 2015-05-26 NOTE — Progress Notes (Signed)
SATURATION QUALIFICATIONS: (This note is used to comply with regulatory documentation for home oxygen)  Patient Saturations on Room Air at Rest = 94%  Patient Saturations on Room Air while Ambulating = 90%  Patient Saturations on 2 Liters of oxygen while Ambulating = 98%  Please briefly explain why patient needs home oxygen: patient does not qualify

## 2015-05-26 NOTE — Progress Notes (Signed)
Patient discharged via wheelchair and private vehicle. IV removed and catheter intact. All discharge instructions given and patient verbalizes understanding. Tele removed and returned. prescriptions given to patient. Patient received and educated on how to use home nebulizer patient returned demonstration. All questions answered. No distress noted.

## 2015-05-26 NOTE — Discharge Instructions (Signed)
Heart Failure Clinic appointment on June 07, 2015 at 11:00am with Clarisa Kindred, FNP. Please call [phone] to reschedule.   Fluid restriction up to 1400 ml daily Low salt diet. Daily weigh your self, if > 2 Lb gain in a day or > 5 Lb in 1 week- take lasix 40 mg 3 times a day for 2 days- and still not able to get rid of extra weight- call your doctor or cardiologist.

## 2015-05-27 LAB — CULTURE, RESPIRATORY: CULTURE: NORMAL

## 2015-05-27 LAB — CULTURE, RESPIRATORY W GRAM STAIN

## 2015-06-07 ENCOUNTER — Ambulatory Visit: Payer: Medicare HMO | Attending: Family | Admitting: Family

## 2015-06-07 ENCOUNTER — Encounter: Payer: Self-pay | Admitting: Family

## 2015-06-07 VITALS — BP 130/62 | HR 87 | Resp 20 | Ht 65.0 in | Wt 188.0 lb

## 2015-06-07 DIAGNOSIS — Z7984 Long term (current) use of oral hypoglycemic drugs: Secondary | ICD-10-CM | POA: Diagnosis not present

## 2015-06-07 DIAGNOSIS — Z79899 Other long term (current) drug therapy: Secondary | ICD-10-CM | POA: Insufficient documentation

## 2015-06-07 DIAGNOSIS — I5042 Chronic combined systolic (congestive) and diastolic (congestive) heart failure: Secondary | ICD-10-CM | POA: Insufficient documentation

## 2015-06-07 DIAGNOSIS — Z9889 Other specified postprocedural states: Secondary | ICD-10-CM | POA: Diagnosis not present

## 2015-06-07 DIAGNOSIS — R200 Anesthesia of skin: Secondary | ICD-10-CM | POA: Diagnosis not present

## 2015-06-07 DIAGNOSIS — I110 Hypertensive heart disease with heart failure: Secondary | ICD-10-CM | POA: Insufficient documentation

## 2015-06-07 DIAGNOSIS — F17210 Nicotine dependence, cigarettes, uncomplicated: Secondary | ICD-10-CM | POA: Diagnosis not present

## 2015-06-07 DIAGNOSIS — J449 Chronic obstructive pulmonary disease, unspecified: Secondary | ICD-10-CM | POA: Diagnosis not present

## 2015-06-07 DIAGNOSIS — E119 Type 2 diabetes mellitus without complications: Secondary | ICD-10-CM | POA: Insufficient documentation

## 2015-06-07 DIAGNOSIS — I10 Essential (primary) hypertension: Secondary | ICD-10-CM

## 2015-06-07 DIAGNOSIS — I2510 Atherosclerotic heart disease of native coronary artery without angina pectoris: Secondary | ICD-10-CM | POA: Diagnosis not present

## 2015-06-07 DIAGNOSIS — Z8673 Personal history of transient ischemic attack (TIA), and cerebral infarction without residual deficits: Secondary | ICD-10-CM | POA: Diagnosis not present

## 2015-06-07 DIAGNOSIS — Z7982 Long term (current) use of aspirin: Secondary | ICD-10-CM | POA: Diagnosis not present

## 2015-06-07 DIAGNOSIS — G4733 Obstructive sleep apnea (adult) (pediatric): Secondary | ICD-10-CM | POA: Insufficient documentation

## 2015-06-07 DIAGNOSIS — K219 Gastro-esophageal reflux disease without esophagitis: Secondary | ICD-10-CM | POA: Diagnosis not present

## 2015-06-07 DIAGNOSIS — Z720 Tobacco use: Secondary | ICD-10-CM

## 2015-06-07 DIAGNOSIS — I739 Peripheral vascular disease, unspecified: Secondary | ICD-10-CM | POA: Insufficient documentation

## 2015-06-07 DIAGNOSIS — Z794 Long term (current) use of insulin: Secondary | ICD-10-CM | POA: Insufficient documentation

## 2015-06-07 DIAGNOSIS — I5022 Chronic systolic (congestive) heart failure: Secondary | ICD-10-CM | POA: Insufficient documentation

## 2015-06-07 DIAGNOSIS — Z9989 Dependence on other enabling machines and devices: Secondary | ICD-10-CM

## 2015-06-07 NOTE — Progress Notes (Signed)
Subjective:    Patient ID: Alexis Ray, female    DOB: Nov 05, 1955, 60 y.o.   MRN: [id number]  Congestive Heart Failure Presents for initial visit. The disease course has been stable. Associated symptoms include fatigue. Pertinent negatives include no abdominal pain, chest pain, edema, orthopnea, palpitations or shortness of breath. The symptoms have been stable. Past treatments include angiotensin receptor blockers, aldosterone receptor blockers, beta blockers, oxygen and salt and fluid restriction. The treatment provided moderate relief. Compliance with prior treatments has been good. Her past medical history is significant for CAD, chronic lung disease, CVA, DM and HTN. Compliance with total regimen is 76-100%.  Hypertension This is a chronic problem. The current episode started more than 1 year ago. The problem is unchanged. The problem is controlled. Pertinent negatives include no chest pain, headaches, neck pain, palpitations, peripheral edema or shortness of breath. There are no associated agents to hypertension. Risk factors for coronary artery disease include diabetes mellitus and smoking/tobacco exposure. Past treatments include angiotensin blockers, beta blockers, diuretics and lifestyle changes. The current treatment provides moderate improvement. Hypertensive end-organ damage includes CAD/MI, CVA and heart failure.    Past Medical History  Diagnosis Date  . Diabetes mellitus without complication (HCC)   . Hypertension   . Chronic combined systolic and diastolic CHF (congestive heart failure) (HCC)   . Stroke (cerebrum) (HCC)   . COPD (chronic obstructive pulmonary disease) (HCC)   . GERD (gastroesophageal reflux disease)   . PVD (peripheral vascular disease) (HCC)   . CAD (coronary artery disease)     Past Surgical History  Procedure Laterality Date  . Abdominal hysterectomy    . Colonoscopy    . Cardiac catheterization      Family History  Problem Relation Age of Onset   . Diabetes Mother   . Cancer Father   . Diabetes Maternal Aunt   . Diabetes Maternal Aunt     Social History  Substance Use Topics  . Smoking status: Current Every Day Smoker  . Smokeless tobacco: Never Used  . Alcohol Use: No    No Known Allergies  Prior to Admission medications   Medication Sig Start Date End Date Taking? Authorizing Provider  albuterol (PROVENTIL) (2.5 MG/3ML) 0.083% nebulizer solution Take 3 mLs (2.5 mg total) by nebulization every 6 (six) hours as needed for wheezing or shortness of breath. 05/26/15  Yes Altamese Dilling, MD  albuterol (VENTOLIN HFA) 108 (90 Base) MCG/ACT inhaler Inhale into the lungs every 6 (six) hours as needed for wheezing or shortness of breath.   Yes Historical Provider, MD  aspirin EC 81 MG tablet Take 81 mg by mouth daily.   Yes Historical Provider, MD  budesonide-formoterol (SYMBICORT) 160-4.5 MCG/ACT inhaler Inhale 2 puffs into the lungs 2 (two) times daily.   Yes Historical Provider, MD  carvedilol (COREG) 12.5 MG tablet Take 25 mg by mouth 2 (two) times daily.    Yes Historical Provider, MD  cholecalciferol (VITAMIN D) 1000 units tablet Take 1,000 Units by mouth daily.   Yes Historical Provider, MD  cilostazol (PLETAL) 100 MG tablet Take 100 mg by mouth 2 (two) times daily.   Yes Historical Provider, MD  diphenhydrAMINE (BENADRYL) 25 mg capsule Take 25 mg by mouth every 6 (six) hours as needed.   Yes Historical Provider, MD  esomeprazole (NEXIUM) 40 MG capsule Take 40 mg by mouth daily at 12 noon.   Yes Historical Provider, MD  furosemide (LASIX) 40 MG tablet Take 1 tablet (40 mg total)  by mouth 2 (two) times daily. Patient taking differently: Take 40 mg by mouth daily.  05/01/15 04/30/16 Yes Jennye Moccasin, MD  insulin aspart (NOVOLOG) 100 UNIT/ML injection Inject 10 Units into the skin 3 (three) times daily with meals.   Yes Historical Provider, MD  insulin glargine (LANTUS) 100 UNIT/ML injection Inject 40 Units into the skin at  bedtime.   Yes Historical Provider, MD  levocetirizine (XYZAL) 5 MG tablet Take 5 mg by mouth at bedtime.    Yes Historical Provider, MD  metFORMIN (GLUCOPHAGE) 1000 MG tablet Take 1,000 mg by mouth 2 (two) times daily with a meal.   Yes Historical Provider, MD  nitroGLYCERIN (NITROSTAT) 0.4 MG SL tablet Place 0.4 mg under the tongue every 5 (five) minutes as needed for chest pain.   Yes Historical Provider, MD  pregabalin (LYRICA) 150 MG capsule Take 150 mg by mouth 3 (three) times daily.    Yes Historical Provider, MD  rosuvastatin (CRESTOR) 40 MG tablet Take 40 mg by mouth daily.   Yes Historical Provider, MD  spironolactone (ALDACTONE) 50 MG tablet Take 50 mg by mouth daily.   Yes Historical Provider, MD  tiotropium (SPIRIVA) 18 MCG inhalation capsule Place 18 mcg into inhaler and inhale daily.   Yes Historical Provider, MD  valsartan (DIOVAN) 40 MG tablet Take 40 mg by mouth 2 (two) times daily.   Yes Historical Provider, MD  Venlafaxine HCl 225 MG TB24 Take 225 mg by mouth daily.    Yes Historical Provider, MD  vitamin B-12 (CYANOCOBALAMIN) 1000 MCG tablet Take 1,000 mcg by mouth daily.   Yes Historical Provider, MD  vitamin C (ASCORBIC ACID) 500 MG tablet Take 500 mg by mouth 2 (two) times daily.   Yes Historical Provider, MD     Review of Systems  Constitutional: Positive for fatigue. Negative for appetite change.  HENT: Positive for congestion and rhinorrhea. Negative for sore throat.   Eyes: Positive for itching. Negative for redness.  Respiratory: Positive for cough (little bit). Negative for chest tightness, shortness of breath and wheezing.   Cardiovascular: Negative for chest pain, palpitations and leg swelling.  Gastrointestinal: Negative for abdominal pain and abdominal distention.  Endocrine: Negative.   Genitourinary: Negative.   Musculoskeletal: Positive for back pain and arthralgias (left shoulder & both knees). Negative for neck pain.  Skin: Negative.    Allergic/Immunologic: Negative.   Neurological: Positive for weakness (sometimes in lower legs) and numbness (& tingling in both legs). Negative for dizziness, light-headedness and headaches.  Hematological: Negative for adenopathy. Does not bruise/bleed easily.  Psychiatric/Behavioral: Positive for sleep disturbance (sleeping on 2 pillows, CPAP with oxygen. trouble staying asleep). Negative for dysphoric mood. The patient is not nervous/anxious.        Objective:   Physical Exam  Constitutional: She is oriented to person, place, and time. She appears well-developed and well-nourished.  HENT:  Head: Normocephalic and atraumatic.  Eyes: Conjunctivae are normal. Pupils are equal, round, and reactive to light.  Neck: Normal range of motion. Neck supple.  Cardiovascular: Normal rate and regular rhythm.   Pulmonary/Chest: Effort normal. She has no wheezes. She has no rales.  Abdominal: Soft. She exhibits no distension. There is no tenderness.  Musculoskeletal: She exhibits no edema or tenderness.  Neurological: She is alert and oriented to person, place, and time.  Skin: Skin is warm and dry.  Psychiatric: She has a normal mood and affect. Her behavior is normal. Thought content normal.  Nursing note and vitals reviewed.  BP 130/62 mmHg  Pulse 87  Resp 20  Ht  (1.651 m)  Wt 188 lb (85.276 kg)  BMI 31.28 kg/m2  SpO2 96%       Assessment & Plan:  1: Chronic heart failure with reduced ejection fraction- Patient presents with fatigue upon exertion which improves quickly upon rest. Denies any shortness of breath or swelling in her legs. She is already weighing herself daily and says that her weight has been stable. Instructed to call for an overnight weight gain of >2 pounds or a weekly weight gain of >5 pounds. She is not adding any salt to her food and does rinse off canned vegetables prior to cooking them. She also reads food labels on occasion. Discussed the importance of  following a  sodium diet and written information was given to her about this. She has received her flu vaccine for the season. Discussed possibly increasing carvedilol, changing valsartan to entresto and/or adding bidil at future visits. Would probably want to decrease spironolactone as other meds get added/adjusted.  2: HTN- Blood pressure looks good. Continue medications at this time. 3: Obstructive sleep apnea- She says that she wears her CPAP on a nightly basis along with oxygen. She says that she falls asleep easily but has trouble staying asleep. She is to follow-up with her PCP regarding this. 4: Bilateral leg numbness/tingling- She says that she's been told that she has bone spurs along her spine and intermittently and randomly her legs will get numb & tingly and then just "give out" because they feel so weak. Encouraged her to speak with her PCP about further work-up and management. 5: Tobacco use- Patient says that she's down to smoking 5 cigarettes daily and is going to try to reduce it even more. Would like the patches but she says that she can't afford them. Complete cessation was discussed for 3 minutes with her.   Return here in 1 month or sooner for any questions/problems before then.

## 2015-06-07 NOTE — Patient Instructions (Signed)
Continue weighing daily and call for an overnight weight gain of > 2 pounds or a weekly weight gain of >5 pounds. 

## 2015-07-05 ENCOUNTER — Ambulatory Visit: Payer: Medicare HMO | Admitting: Family

## 2015-07-05 ENCOUNTER — Telehealth: Payer: Self-pay | Admitting: Family

## 2015-07-05 NOTE — Telephone Encounter (Signed)
Patient did not show for her [facility] appointment on 07/05/15. Will attempt to reschedule.

## 2015-07-09 ENCOUNTER — Encounter: Payer: Self-pay | Admitting: Family

## 2015-07-09 ENCOUNTER — Ambulatory Visit: Payer: Medicare HMO | Attending: Family | Admitting: Family

## 2015-07-09 VITALS — BP 153/73 | HR 88 | Resp 20 | Ht 65.0 in | Wt 189.0 lb

## 2015-07-09 DIAGNOSIS — Z7982 Long term (current) use of aspirin: Secondary | ICD-10-CM | POA: Insufficient documentation

## 2015-07-09 DIAGNOSIS — R002 Palpitations: Secondary | ICD-10-CM | POA: Insufficient documentation

## 2015-07-09 DIAGNOSIS — I10 Essential (primary) hypertension: Secondary | ICD-10-CM | POA: Diagnosis not present

## 2015-07-09 DIAGNOSIS — I5042 Chronic combined systolic (congestive) and diastolic (congestive) heart failure: Secondary | ICD-10-CM | POA: Insufficient documentation

## 2015-07-09 DIAGNOSIS — I739 Peripheral vascular disease, unspecified: Secondary | ICD-10-CM | POA: Insufficient documentation

## 2015-07-09 DIAGNOSIS — I2510 Atherosclerotic heart disease of native coronary artery without angina pectoris: Secondary | ICD-10-CM | POA: Diagnosis not present

## 2015-07-09 DIAGNOSIS — E119 Type 2 diabetes mellitus without complications: Secondary | ICD-10-CM | POA: Insufficient documentation

## 2015-07-09 DIAGNOSIS — Z79899 Other long term (current) drug therapy: Secondary | ICD-10-CM | POA: Diagnosis not present

## 2015-07-09 DIAGNOSIS — Z7984 Long term (current) use of oral hypoglycemic drugs: Secondary | ICD-10-CM | POA: Diagnosis not present

## 2015-07-09 DIAGNOSIS — M7989 Other specified soft tissue disorders: Secondary | ICD-10-CM | POA: Insufficient documentation

## 2015-07-09 DIAGNOSIS — K219 Gastro-esophageal reflux disease without esophagitis: Secondary | ICD-10-CM | POA: Diagnosis not present

## 2015-07-09 DIAGNOSIS — Z720 Tobacco use: Secondary | ICD-10-CM

## 2015-07-09 DIAGNOSIS — I5022 Chronic systolic (congestive) heart failure: Secondary | ICD-10-CM

## 2015-07-09 DIAGNOSIS — Z8673 Personal history of transient ischemic attack (TIA), and cerebral infarction without residual deficits: Secondary | ICD-10-CM | POA: Diagnosis not present

## 2015-07-09 DIAGNOSIS — Z794 Long term (current) use of insulin: Secondary | ICD-10-CM | POA: Insufficient documentation

## 2015-07-09 DIAGNOSIS — J449 Chronic obstructive pulmonary disease, unspecified: Secondary | ICD-10-CM | POA: Diagnosis not present

## 2015-07-09 DIAGNOSIS — F17210 Nicotine dependence, cigarettes, uncomplicated: Secondary | ICD-10-CM | POA: Insufficient documentation

## 2015-07-09 DIAGNOSIS — Z9889 Other specified postprocedural states: Secondary | ICD-10-CM | POA: Insufficient documentation

## 2015-07-09 DIAGNOSIS — R140 Abdominal distension (gaseous): Secondary | ICD-10-CM | POA: Insufficient documentation

## 2015-07-09 MED ORDER — FUROSEMIDE 40 MG PO TABS: 40.0000 mg | ORAL_TABLET | Freq: Two times a day (BID) | ORAL | Status: DC

## 2015-07-09 MED ORDER — SACUBITRIL-VALSARTAN 24-26 MG PO TABS: 1.0000 | ORAL_TABLET | Freq: Two times a day (BID) | ORAL | Status: DC

## 2015-07-09 NOTE — Progress Notes (Signed)
Subjective:    Patient ID: Alexis Ray, female    DOB: Jan 18, 1956, 60 y.o.   MRN: [id number]  Congestive Heart Failure Presents for follow-up visit. The disease course has been worsening. Associated symptoms include chest pressure (tightness in chest), edema, fatigue, orthopnea, palpitations and shortness of breath. Pertinent negatives include no abdominal pain or chest pain. The symptoms have been worsening. Past treatments include angiotensin receptor blockers, beta blockers and salt and fluid restriction. The treatment provided moderate relief. Prior compliance problems include medication issues (ran out of lasix a week ago). Her past medical history is significant for CAD, chronic lung disease, CVA, DM and HTN.  Hypertension This is a chronic problem. The current episode started more than 1 year ago. The problem has been waxing and waning since onset. Associated symptoms include palpitations, peripheral edema and shortness of breath. Pertinent negatives include no blurred vision, chest pain or headaches. There are no associated agents to hypertension. Risk factors for coronary artery disease include diabetes mellitus and smoking/tobacco exposure. Past treatments include angiotensin blockers, beta blockers, diuretics and lifestyle changes. The current treatment provides moderate improvement. Compliance problems include exercise.  Hypertensive end-organ damage includes CAD/MI and heart failure.    Past Medical History  Diagnosis Date  . Diabetes mellitus without complication (HCC)   . Hypertension   . Chronic combined systolic and diastolic CHF (congestive heart failure) (HCC)   . Stroke (cerebrum) (HCC)   . COPD (chronic obstructive pulmonary disease) (HCC)   . GERD (gastroesophageal reflux disease)   . PVD (peripheral vascular disease) (HCC)   . CAD (coronary artery disease)     Past Surgical History  Procedure Laterality Date  . Abdominal hysterectomy    . Colonoscopy    . Cardiac  catheterization      Family History  Problem Relation Age of Onset  . Diabetes Mother   . Cancer Father   . Diabetes Maternal Aunt   . Diabetes Maternal Aunt     Social History  Substance Use Topics  . Smoking status: Current Every Day Smoker  . Smokeless tobacco: Never Used  . Alcohol Use: No    No Known Allergies  Prior to Admission medications   Medication Sig Start Date End Date Taking? Authorizing Provider  albuterol (PROVENTIL) (2.5 MG/3ML) 0.083% nebulizer solution Take 3 mLs (2.5 mg total) by nebulization every 6 (six) hours as needed for wheezing or shortness of breath. 05/26/15  Yes Altamese Dilling, MD  albuterol (VENTOLIN HFA) 108 (90 Base) MCG/ACT inhaler Inhale into the lungs every 6 (six) hours as needed for wheezing or shortness of breath.   Yes Historical Provider, MD  aspirin EC 81 MG tablet Take 81 mg by mouth daily.   Yes Historical Provider, MD  budesonide-formoterol (SYMBICORT) 160-4.5 MCG/ACT inhaler Inhale 2 puffs into the lungs 2 (two) times daily.   Yes Historical Provider, MD  carvedilol (COREG) 25 MG tablet Take 12.5 mg by mouth 2 (two) times daily with a meal.   Yes Historical Provider, MD  cilostazol (PLETAL) 100 MG tablet Take 100 mg by mouth 2 (two) times daily.   Yes Historical Provider, MD  diphenhydrAMINE (BENADRYL) 25 mg capsule Take 25 mg by mouth every 6 (six) hours as needed.   Yes Historical Provider, MD  docusate sodium (COLACE) 100 MG capsule Take 100 mg by mouth daily as needed for mild constipation.   Yes Historical Provider, MD  esomeprazole (NEXIUM) 40 MG capsule Take 40 mg by mouth daily at 12 noon.  Yes Historical Provider, MD  insulin aspart (NOVOLOG) 100 UNIT/ML injection Inject 10 Units into the skin 3 (three) times daily with meals.   Yes Historical Provider, MD  insulin glargine (LANTUS) 100 UNIT/ML injection Inject 40 Units into the skin at bedtime.   Yes Historical Provider, MD  levocetirizine (XYZAL) 5 MG tablet Take 5 mg  by mouth at bedtime.    Yes Historical Provider, MD  metFORMIN (GLUCOPHAGE) 1000 MG tablet Take 1,000 mg by mouth 2 (two) times daily with a meal.   Yes Historical Provider, MD  Multiple Vitamins-Minerals (SENTRY SENIOR) TABS Take 1 tablet by mouth daily.   Yes Historical Provider, MD  nitroGLYCERIN (NITROSTAT) 0.4 MG SL tablet Place 0.4 mg under the tongue every 5 (five) minutes as needed for chest pain.   Yes Historical Provider, MD  pregabalin (LYRICA) 150 MG capsule Take 150 mg by mouth 3 (three) times daily.    Yes Historical Provider, MD  rosuvastatin (CRESTOR) 40 MG tablet Take 40 mg by mouth daily.   Yes Historical Provider, MD  spironolactone (ALDACTONE) 50 MG tablet Take 50 mg by mouth daily.   Yes Historical Provider, MD  tiotropium (SPIRIVA) 18 MCG inhalation capsule Place 18 mcg into inhaler and inhale daily.   Yes Historical Provider, MD  Venlafaxine HCl 225 MG TB24 Take 225 mg by mouth daily.    Yes Historical Provider, MD  furosemide (LASIX) 40 MG tablet Take 1 tablet (40 mg total) by mouth 2 (two) times daily. 07/09/15 07/08/16  Delma Freezeina A Danyla Wattley, FNP  sacubitril-valsartan (ENTRESTO) 24-26 MG Take 1 tablet by mouth 2 (two) times daily. 07/09/15   Delma Freezeina A Shawndell Schillaci, FNP  sacubitril-valsartan (ENTRESTO) 24-26 MG Take 1 tablet by mouth 2 (two) times daily. 07/09/15   Delma Freezeina A Lenville Hibberd, FNP     Review of Systems  Constitutional: Positive for fatigue. Negative for appetite change.  HENT: Negative for congestion, postnasal drip and sore throat.   Eyes: Negative.  Negative for blurred vision.  Respiratory: Positive for cough, chest tightness and shortness of breath. Negative for wheezing.   Cardiovascular: Positive for palpitations and leg swelling. Negative for chest pain.  Gastrointestinal: Positive for abdominal distention. Negative for abdominal pain.  Endocrine: Negative.   Genitourinary: Negative.   Musculoskeletal: Positive for back pain and arthralgias (left knee).  Skin: Negative.    Allergic/Immunologic: Negative.   Neurological: Positive for light-headedness. Negative for dizziness and headaches.  Hematological: Negative for adenopathy. Does not bruise/bleed easily.  Psychiatric/Behavioral: Positive for sleep disturbance (sleeping on 3 pillows. Worse over the last week. wearing oxygen at 3L at bedtime). Negative for dysphoric mood. The patient is not nervous/anxious.        Objective:   Physical Exam  Constitutional: She is oriented to person, place, and time. She appears well-developed and well-nourished.  HENT:  Head: Normocephalic and atraumatic.  Eyes: Conjunctivae are normal. Pupils are equal, round, and reactive to light.  Neck: Normal range of motion. Neck supple.  Cardiovascular: Normal rate and regular rhythm.   Pulmonary/Chest: Effort normal. She has rales in the left upper field and the left lower field.  Musculoskeletal: She exhibits edema (1+ pitting edema in left lower leg). She exhibits no tenderness.  Neurological: She is alert and oriented to person, place, and time.  Skin: Skin is warm and dry.  Psychiatric: She has a normal mood and affect. Her behavior is normal. Thought content normal.  Nursing note and vitals reviewed.   BP 153/73 mmHg  Pulse 88  Resp 20  Ht  (1.651 m)  Wt 189 lb (85.73 kg)  BMI 31.45 kg/m2  SpO2 97%       Assessment & Plan:  1: Chronic heart failure with reduced ejection fraction- Patient presents with fatigue, shortness of breath, leg swelling and abdominal distention over the last week. She continues to weigh herself and says that her weight has been stable. By our scale, her weight is essentially unchanged. Reminded her to call for an overnight weight gain of >2 pounds or a weekly weight gain of >5 pounds. She is not adding salt to her foot and tries to eat low sodium foods. She says that she's been out of her furosemide for the last week. She says that she called it in but was told her doctor had to approve  it. Refill sent in today and she was instructed to let us know if her medications pertaining to her heart/blood pressure needed to be refilled so that she wouldn't be out of her medications again. Patient verbalized understanding. She continues to wear her oxygen at 3L at bedtime. Will switch her to entresto today so she was instructed to stop her valsartan when she picks up the entresto 24/26mg  that she will take BID. 30 day voucher given with prescription sent to local pharmacy along with mail order prescription sent to [facility]. Will plan on checking BMP at her next visit.  2: HTN- Blood pressure looks pretty good today. Continue medications with change of valsartan above. She sees her PCP on 07/12/15. 3: Diabetes- She is unsure of what her glucose is. Continues to take her insulin and oral medications. Encouraged her to check it at least on a daily basis. 4: Tobacco use- Patient says that she only smokes 1 cigarette daily and that is when she wakes up in the morning. Encouraged her to change that habit and complete cessation was discussed for 3 minutes with her.   Return here in 1 month or sooner for any questions/problems before then.

## 2015-07-09 NOTE — Patient Instructions (Addendum)
Continue weighing daily and call for an overnight weight gain of > 2 pounds or a weekly weight gain of >5 pounds.  Begin taking entresto 24/26mg  twice daily. When you begin taking the entresto, do NOT take any more of your valsartan.

## 2015-07-12 ENCOUNTER — Other Ambulatory Visit: Payer: Self-pay | Admitting: Family

## 2015-07-12 MED ORDER — SACUBITRIL-VALSARTAN 24-26 MG PO TABS: 1.0000 | ORAL_TABLET | Freq: Two times a day (BID) | ORAL | Status: DC

## 2015-07-31 ENCOUNTER — Other Ambulatory Visit: Payer: Self-pay | Admitting: Family

## 2015-07-31 ENCOUNTER — Other Ambulatory Visit: Payer: Self-pay

## 2015-07-31 NOTE — Telephone Encounter (Signed)
Returned patient's call and patient said that she finally received the lyrica from her PCP. She said that she received the prescription yesterday (07/30/15).

## 2015-07-31 NOTE — Telephone Encounter (Signed)
Pt called she states she would like you to refill her Lyrica. She says she has been trying to get a refill from her PCP and has been unsuccessful. She has been without her medication for a week now and she is not sure how much longer she can stand to be without it. I advised that this is a medication that needs to be filled by her PCP, however she is insistent that you can fill it for her.

## 2015-08-03 ENCOUNTER — Ambulatory Visit: Payer: Medicare HMO | Admitting: Family

## 2015-08-10 ENCOUNTER — Telehealth: Payer: Self-pay | Admitting: Family

## 2015-08-10 ENCOUNTER — Ambulatory Visit: Payer: Medicare HMO | Admitting: Family

## 2015-08-10 NOTE — Telephone Encounter (Signed)
Patient missed her appointment at the [facility] on 08/10/15. Will attempt to reschedule.

## 2015-10-17 ENCOUNTER — Encounter: Payer: Medicare HMO | Attending: Internal Medicine | Admitting: *Deleted

## 2015-10-17 VITALS — Ht 65.5 in | Wt 189.0 lb

## 2015-10-17 DIAGNOSIS — I10 Essential (primary) hypertension: Secondary | ICD-10-CM | POA: Insufficient documentation

## 2015-10-17 DIAGNOSIS — I5022 Chronic systolic (congestive) heart failure: Secondary | ICD-10-CM | POA: Insufficient documentation

## 2015-10-17 DIAGNOSIS — J449 Chronic obstructive pulmonary disease, unspecified: Secondary | ICD-10-CM | POA: Insufficient documentation

## 2015-10-17 DIAGNOSIS — I2510 Atherosclerotic heart disease of native coronary artery without angina pectoris: Secondary | ICD-10-CM | POA: Insufficient documentation

## 2015-10-17 DIAGNOSIS — Z9889 Other specified postprocedural states: Secondary | ICD-10-CM | POA: Insufficient documentation

## 2015-10-17 NOTE — Patient Instructions (Signed)
Patient Instructions  Patient Details  Name: Alexis Ray MRN: [id number] Date of Birth: 02/14/1956 Referring Provider:  Ford, Hubert James, MD  Below are the personal goals you chose as well as exercise and nutrition goals. Our goal is to help you keep on track towards obtaining and maintaining your goals. We will be discussing your progress on these goals with you throughout the program.  Initial Exercise Prescription:     Initial Exercise Prescription - 10/17/15 1500    Date of Initial Exercise RX and Referring Provider   Date 10/17/15   Oxygen   Oxygen Continuous   Liters 3   NuStep   Level 1   Watts 20   Minutes 15   Biostep-RELP   Level 1   Watts 20   Minutes 15   Prescription Details   Frequency (times per week) 2   Duration Progress to 30 minutes of continuous aerobic without signs/symptoms of physical distress   Intensity   THRR 40-80% of Max Heartrate 108-143   Ratings of Perceived Exertion 11-13   Perceived Dyspnea 0-4   Progression   Progression Continue to progress workloads to maintain intensity without signs/symptoms of physical distress.   Resistance Training   Training Prescription Yes   Weight 1   Reps 10-15      Exercise Goals: Frequency: Be able to perform aerobic exercise three times per week working toward 3-5 days per week.  Intensity: Work with a perceived exertion of 11 (fairly light) - 15 (hard) as tolerated. Follow your new exercise prescription and watch for changes in prescription as you progress with the program. Changes will be reviewed with you when they are made.  Duration: You should be able to do 30 minutes of continuous aerobic exercise in addition to a 5 minute warm-up and a 5 minute cool-down routine.  Nutrition Goals: Your personal nutrition goals will be established when you do your nutrition analysis with the dietician.  The following are nutrition guidelines to follow: Cholesterol < 200mg/day Sodium < 1500mg/day Fiber:  Women over 50 yrs - 21 grams per day  Personal Goals:     Personal Goals and Risk Factors at Admission - 10/17/15 1420    Core Components/Risk Factors/Patient Goals on Admission    Weight Management Yes;Obesity   Intervention Weight Management: Develop a combined nutrition and exercise program designed to reach desired caloric intake, while maintaining appropriate intake of nutrient and fiber, sodium and fats, and appropriate energy expenditure required for the weight goal.;Weight Management: Provide education and appropriate resources to help participant work on and attain dietary goals.;Weight Management/Obesity: Establish reasonable short term and long term weight goals.;Obesity: Provide education and appropriate resources to help participant work on and attain dietary goals.  Already workin on weight loss , was 212 lbs last year.    Admit Weight 189 lb (85.73 kg)   Goal Weight: Short Term 185 lb (83.915 kg)   Goal Weight: Long Term 150 lb (68.04 kg)   Expected Outcomes Short Term: Continue to assess and modify interventions until short term weight is achieved;Long Term: Adherence to nutrition and physical activity/exercise program aimed toward attainment of established weight goal;Weight Loss: Understanding of general recommendations for a balanced deficit meal plan, which promotes 1-2 lb weight loss per week and includes a negative energy balance of 500-1000 kcal/d   Sedentary Yes   Intervention Provide advice, education, support and counseling about physical activity/exercise needs.;Develop an individualized exercise prescription for aerobic and resistive training based on initial evaluation   findings, risk stratification, comorbidities and participant's personal goals.   Expected Outcomes Achievement of increased cardiorespiratory fitness and enhanced flexibility, muscular endurance and strength shown through measurements of functional capacity and personal statement of participant.   Tobacco  Cessation Yes   Intervention Assist the participant in steps to quit. Provide individualized education and counseling about committing to Tobacco Cessation, relapse prevention, and pharmacological support that can be provided by physician.   Expected Outcomes Short Term: Will quit all tobacco product use, adhering to prevention of relapse plan.;Long Term: Complete abstinence from all tobacco products for at least 12 months from quit date.;Short Term: Will demonstrate readiness to quit, by selecting a quit date.  IS currently working on weaning off cigareetes.  Down to 2 cigaretts a day.  Wants to be quit by end of month.    Diabetes Yes   Intervention Provide education about signs/symptoms and action to take for hypo/hyperglycemia.;Provide education about proper nutrition, including hydration, and aerobic/resistive exercise prescription along with prescribed medications to achieve blood glucose in normal ranges: Fasting glucose 65-99 mg/dL   Expected Outcomes Short Term: Participant verbalizes understanding of the signs/symptoms and immediate care of hyper/hypoglycemia, proper foot care and importance of medication, aerobic/resistive exercise and nutrition plan for blood glucose control.;Long Term: Attainment of HbA1C < 7%.   Heart Failure Yes   Intervention Provide a combined exercise and nutrition program that is supplemented with education, support and counseling about heart failure. Directed toward relieving symptoms such as shortness of breath, decreased exercise tolerance, and extremity edema.   Expected Outcomes Improve functional capacity of life;Short term: Attendance in program 2-3 days a week with increased exercise capacity. Reported lower sodium intake. Reported increased fruit and vegetable intake. Reports medication compliance.;Short term: Daily weights obtained and reported for increase. Utilizing diuretic protocols set by physician.;Long term: Adoption of self-care skills and reduction of  barriers for early signs and symptoms recognition and intervention leading to self-care maintenance.   Hypertension Yes   Intervention Provide education on lifestyle modifcations including regular physical activity/exercise, weight management, moderate sodium restriction and increased consumption of fresh fruit, vegetables, and low fat dairy, alcohol moderation, and smoking cessation.   Expected Outcomes Short Term: Continued assessment and intervention until BP is < 140/4690mm HG in hypertensive participants. < 130/680mm HG in hypertensive participants with diabetes, heart failure or chronic kidney disease.;Long Term: Maintenance of blood pressure at goal levels.   Lipids Yes   Intervention Provide education and support for participant on nutrition & aerobic/resistive exercise along with prescribed medications to achieve LDL 70mg , HDL >40mg .   Expected Outcomes Short Term: Participant states understanding of desired cholesterol values and is compliant with medications prescribed. Participant is following exercise prescription and nutrition guidelines.;Long Term: Cholesterol controlled with medications as prescribed, with individualized exercise RX and with personalized nutrition plan. Value goals: LDL < 70mg , HDL > 40 mg.      Tobacco Use Initial Evaluation: History  Smoking status  . Current Every Day Smoker  Smokeless tobacco  . Never Used    Copy of goals given to participant.

## 2015-10-17 NOTE — Progress Notes (Signed)
Daily Session Note  Patient Details  Name: Alexis Ray MRN: [id number] Date of Birth: March 23, 1956 Referring Provider:  Dr Marijean Bravo  Encounter Date: 10/17/2015  Check In:     Session Check In - 10/17/15 1641    Check-In   Supervising physician immediately available to respond to emergencies See telemetry face sheet for immediately available ER MD   Medication changes reported     No   Fall or balance concerns reported    No   Warm-up and Cool-down Not performed (comment)   Resistance Training Performed No   VAD Patient? No   Pain Assessment   Currently in Pain? No/denies         Goals Met:  Personal goals reviewed No report of cardiac concerns or symptoms initial medical review today  Goals Unmet:  Not Applicable  Comments:    Dr. Emily Filbert is Medical Director for [facility].

## 2015-10-17 NOTE — Progress Notes (Signed)
Cardiac Individual Treatment Plan  Patient Details  Name: Alexis Ray MRN: [id number] Date of Birth: [birth year] Referring Provider:    Initial Encounter Date:       Cardiac Rehab from 10/17/2015 in [facility] Cardiac and Pulmonary Rehab   Date  10/17/15      Visit Diagnosis: Heart failure, chronic systolic (HCC)  Patient's Home Medications on Admission:  Current outpatient prescriptions:  .  albuterol (PROVENTIL) (2.5 MG/3ML) 0.083% nebulizer solution, Take 3 mLs (2.5 mg total) by nebulization every 6 (six) hours as needed for wheezing or shortness of breath., Disp: 75 mL, Rfl: 2 .  albuterol (VENTOLIN HFA) 108 (90 Base) MCG/ACT inhaler, Inhale into the lungs every 6 (six) hours as needed for wheezing or shortness of breath., Disp: , Rfl:  .  aspirin EC 81 MG tablet, Take 81 mg by mouth daily., Disp: , Rfl:  .  budesonide-formoterol (SYMBICORT) 160-4.5 MCG/ACT inhaler, Inhale 2 puffs into the lungs 2 (two) times daily., Disp: , Rfl:  .  carvedilol (COREG) 25 MG tablet, Take 12.5 mg by mouth 2 (two) times daily with a meal., Disp: , Rfl:  .  cilostazol (PLETAL) 100 MG tablet, Take 100 mg by mouth 2 (two) times daily., Disp: , Rfl:  .  diphenhydrAMINE (BENADRYL) 25 mg capsule, Take 25 mg by mouth every 6 (six) hours as needed., Disp: , Rfl:  .  docusate sodium (COLACE) 100 MG capsule, Take 100 mg by mouth daily as needed for mild constipation., Disp: , Rfl:  .  esomeprazole (NEXIUM) 40 MG capsule, Take 40 mg by mouth daily at 12 noon., Disp: , Rfl:  .  furosemide (LASIX) 40 MG tablet, Take 1 tablet (40 mg total) by mouth 2 (two) times daily., Disp: 60 tablet, Rfl: 5 .  insulin aspart (NOVOLOG) 100 UNIT/ML injection, Inject 10 Units into the skin 3 (three) times daily with meals., Disp: , Rfl:  .  insulin glargine (LANTUS) 100 UNIT/ML injection, Inject 40 Units into the skin at bedtime., Disp: , Rfl:  .  levocetirizine (XYZAL) 5 MG tablet, Take 5 mg by mouth at bedtime. , Disp: , Rfl:  .   metFORMIN (GLUCOPHAGE) 1000 MG tablet, Take 1,000 mg by mouth 2 (two) times daily with a meal., Disp: , Rfl:  .  Multiple Vitamins-Minerals (SENTRY SENIOR) TABS, Take 1 tablet by mouth daily., Disp: , Rfl:  .  nitroGLYCERIN (NITROSTAT) 0.4 MG SL tablet, Place 0.4 mg under the tongue every 5 (five) minutes as needed for chest pain., Disp: , Rfl:  .  pregabalin (LYRICA) 150 MG capsule, Take 150 mg by mouth 3 (three) times daily. , Disp: , Rfl:  .  rosuvastatin (CRESTOR) 40 MG tablet, Take 40 mg by mouth daily., Disp: , Rfl:  .  sacubitril-valsartan (ENTRESTO) 24-26 MG, Take 1 tablet by mouth 2 (two) times daily., Disp: 180 tablet, Rfl: 3 .  spironolactone (ALDACTONE) 50 MG tablet, Take 50 mg by mouth daily., Disp: , Rfl:  .  tiotropium (SPIRIVA) 18 MCG inhalation capsule, Place 18 mcg into inhaler and inhale daily., Disp: , Rfl:  .  Venlafaxine HCl 225 MG TB24, Take 225 mg by mouth daily. , Disp: , Rfl:  .  sacubitril-valsartan (ENTRESTO) 24-26 MG, Take 1 tablet by mouth 2 (two) times daily., Disp: 60 tablet, Rfl: 0  Past Medical History: Past Medical History  Diagnosis Date  . Diabetes mellitus without complication (HCC)   . Hypertension   . Chronic combined systolic and diastolic CHF (congestive heart  failure) (HCC)   . Stroke (cerebrum) (HCC)   . COPD (chronic obstructive pulmonary disease) (HCC)   . GERD (gastroesophageal reflux disease)   . PVD (peripheral vascular disease) (HCC)   . CAD (coronary artery disease)     Tobacco Use: History  Smoking status  . Current Every Day Smoker  Smokeless tobacco  . Never Used    Labs: Recent Review Flowsheet Data    Labs for ITP Cardiac and Pulmonary Rehab Latest Ref Rng 08/25/2011 01/22/2015 05/24/2015   Hemoglobin A1c 4.0 - 6.0 % 10.7(H) - 6.8(H)   PHART 7.350 - 7.450 - 7.43 -   PCO2ART 32.0 - 48.0 mmHg - 30(L) -   HCO3 21.0 - 28.0 mEq/L - 19.9(L) -   ACIDBASEDEF 0.0 - 2.0 mmol/L - 3.3(H) -   O2SAT - - 96.1 -       Exercise Target  Goals: Date: 10/17/15  Exercise Program Goal: Individual exercise prescription set with THRR, safety & activity barriers. Participant demonstrates ability to understand and report RPE using BORG scale, to self-measure pulse accurately, and to acknowledge the importance of the exercise prescription.  Exercise Prescription Goal: Starting with aerobic activity 30 plus minutes a day, 3 days per week for initial exercise prescription. Provide home exercise prescription and guidelines that participant acknowledges understanding prior to discharge.  Activity Barriers & Risk Stratification:     Activity Barriers & Cardiac Risk Stratification - 10/17/15 1432    Activity Barriers & Cardiac Risk Stratification   Activity Barriers Shortness of Breath;Neck/Spine Problems;Assistive Device  Has spurs in her back.  Concerned about her back pain increasing with more exercise. Does have a walker to help her manage to stop and rest when needed if she is out walking.    Cardiac Risk Stratification High      6 Minute Walk:     6 Minute Walk      10/17/15 1535       6 Minute Walk   Distance 400 feet     Walk Time 2.9 minutes     # of Rest Breaks 1     MPH 1.7     METS 1.75     RPE 13     Perceived Dyspnea  4     Symptoms Yes (comment)     Comments Bcak pain due to bone spurs is the reason Alexis Ray had to stop     Resting HR 74 bpm     Resting BP 132/82 mmHg     Max Ex. HR 103 bpm     Max Ex. BP 126/60 mmHg        Initial Exercise Prescription:     Initial Exercise Prescription - 10/17/15 1500    Date of Initial Exercise RX and Referring Provider   Date 10/17/15   Oxygen   Oxygen Continuous   Liters 3   NuStep   Level 1   Watts 20   Minutes 15   Biostep-RELP   Level 1   Watts 20   Minutes 15   Prescription Details   Frequency (times per week) 2   Duration Progress to 30 minutes of continuous aerobic without signs/symptoms of physical distress   Intensity   THRR 40-80% of Max  Heartrate 108-143   Ratings of Perceived Exertion 11-13   Perceived Dyspnea 0-4   Progression   Progression Continue to progress workloads to maintain intensity without signs/symptoms of physical distress.   Resistance Training   Training Prescription Yes   Weight  1   Reps 10-15      Perform Capillary Blood Glucose checks as needed.  Exercise Prescription Changes:   Exercise Comments:   Discharge Exercise Prescription (Final Exercise Prescription Changes):   Nutrition:  Target Goals: Understanding of nutrition guidelines, daily intake of sodium 1500mg , cholesterol 200mg , calories 30% from fat and 7% or less from saturated fats, daily to have 5 or more servings of fruits and vegetables.  Biometrics:     Pre Biometrics - 10/17/15 1532    Pre Biometrics   Height 5' 5.5" (1.664 m)   Weight 189 lb (85.73 kg)   Waist Circumference 47 inches   Hip Circumference 42.5 inches   Waist to Hip Ratio 1.11 %   BMI (Calculated) 31   Single Leg Stand 3 seconds       Nutrition Therapy Plan and Nutrition Goals:     Nutrition Therapy & Goals - 10/17/15 1418    Intervention Plan   Intervention Prescribe, educate and counsel regarding individualized specific dietary modifications aiming towards targeted core components such as weight, hypertension, lipid management, diabetes, heart failure and other comorbidities.  Shaquanna is working with [employer] weekly to review nutrition and any other medical needs.    Expected Outcomes Short Term Goal: A plan has been developed with personal nutrition goals set during dietitian appointment.;Short Term Goal: Understand basic principles of dietary content, such as calories, fat, sodium, cholesterol and nutrients.;Long Term Goal: Adherence to prescribed nutrition plan.      Nutrition Discharge: Rate Your Plate Scores:   Nutrition Goals Re-Evaluation:   Psychosocial: Target Goals: Acknowledge presence or absence of depression,  maximize coping skills, provide positive support system. Participant is able to verbalize types and ability to use techniques and skills needed for reducing stress and depression.  Initial Review & Psychosocial Screening:     Initial Psych Review & Screening - 10/17/15 1425    Initial Review   Current issues with Current Sleep Concerns  Does not sleep good,  always wakes up early and takes time  to get back to sleep if tries to go back to sleep.   Not a new action.    Family Dynamics   Good Support System? Yes  2 daughters, grand children nearby and help as needed.   One lives with Keiko and one in close range.   Barriers   Psychosocial barriers to participate in program There are no identifiable barriers or psychosocial needs.;The patient should benefit from training in stress management and relaxation.   Screening Interventions   Interventions Encouraged to exercise      Quality of Life Scores:   PHQ-9:     Recent Review Flowsheet Data    Depression screen [facility] 2/9 10/17/2015 07/09/2015 06/07/2015   Decreased Interest 0 0 0   Down, Depressed, Hopeless 0 0 0   PHQ - 2 Score 0 0 0   Altered sleeping 0 - -   Tired, decreased energy 0 - -   Change in appetite 0 - -   Feeling bad or failure about yourself  0 - -   Trouble concentrating 0 - -   Moving slowly or fidgety/restless 0 - -   Suicidal thoughts 0 - -   PHQ-9 Score 0 - -   Difficult doing work/chores Not difficult at all - -      Psychosocial Evaluation and Intervention:   Psychosocial Re-Evaluation:   Vocational Rehabilitation: Provide vocational rehab assistance to qualifying candidates.   Vocational Rehab Evaluation &  Intervention:     Vocational Rehab - 10/17/15 1432    Initial Vocational Rehab Evaluation & Intervention   Assessment shows need for Vocational Rehabilitation No      Education: Education Goals: Education classes will be provided on a weekly basis, covering required topics. Participant will  state understanding/return demonstration of topics presented.  Learning Barriers/Preferences:     Learning Barriers/Preferences - 10/17/15 1431    Learning Barriers/Preferences   Learning Barriers Exercise Concerns   Learning Preferences None      Education Topics: General Nutrition Guidelines/Fats and Fiber: -Group instruction provided by verbal, written material, models and posters to present the general guidelines for heart healthy nutrition. Gives an explanation and review of dietary fats and fiber.   Controlling Sodium/Reading Food Labels: -Group verbal and written material supporting the discussion of sodium use in heart healthy nutrition. Review and explanation with models, verbal and written materials for utilization of the food label.   Exercise Physiology & Risk Factors: - Group verbal and written instruction with models to review the exercise physiology of the cardiovascular system and associated critical values. Details cardiovascular disease risk factors and the goals associated with each risk factor.   Aerobic Exercise & Resistance Training: - Gives group verbal and written discussion on the health impact of inactivity. On the components of aerobic and resistive training programs and the benefits of this training and how to safely progress through these programs.   Flexibility, Balance, General Exercise Guidelines: - Provides group verbal and written instruction on the benefits of flexibility and balance training programs. Provides general exercise guidelines with specific guidelines to those with heart or lung disease. Demonstration and skill practice provided.   Stress Management: - Provides group verbal and written instruction about the health risks of elevated stress, cause of high stress, and healthy ways to reduce stress.   Depression: - Provides group verbal and written instruction on the correlation between heart/lung disease and depressed mood, treatment  options, and the stigmas associated with seeking treatment.   Anatomy & Physiology of the Heart: - Group verbal and written instruction and models provide basic cardiac anatomy and physiology, with the coronary electrical and arterial systems. Review of: AMI, Angina, Valve disease, Heart Failure, Cardiac Arrhythmia, Pacemakers, and the ICD.   Cardiac Procedures: - Group verbal and written instruction and models to describe the testing methods done to diagnose heart disease. Reviews the outcomes of the test results. Describes the treatment choices: Medical Management, Angioplasty, or Coronary Bypass Surgery.   Cardiac Medications: - Group verbal and written instruction to review commonly prescribed medications for heart disease. Reviews the medication, class of the drug, and side effects. Includes the steps to properly store meds and maintain the prescription regimen.   Go Sex-Intimacy & Heart Disease, Get SMART - Goal Setting: - Group verbal and written instruction through game format to discuss heart disease and the return to sexual intimacy. Provides group verbal and written material to discuss and apply goal setting through the application of the S.M.A.R.T. Method.   Other Matters of the Heart: - Provides group verbal, written materials and models to describe Heart Failure, Angina, Valve Disease, and Diabetes in the realm of heart disease. Includes description of the disease process and treatment options available to the cardiac patient.   Exercise & Equipment Safety: - Individual verbal instruction and demonstration of equipment use and safety with use of the equipment.          Cardiac Rehab from 10/17/2015 in [facility] Cardiac  and Pulmonary Rehab   Date  10/17/15   Educator  SB   Instruction Review Code  2- meets goals/outcomes      Infection Prevention: - Provides verbal and written material to individual with discussion of infection control including proper hand washing and  proper equipment cleaning during exercise session.      Cardiac Rehab from 10/17/2015 in [facility] Cardiac and Pulmonary Rehab   Date  10/17/15   Educator  SB   Instruction Review Code  2- meets goals/outcomes      Falls Prevention: - Provides verbal and written material to individual with discussion of falls prevention and safety.      Cardiac Rehab from 10/17/2015 in [facility] Cardiac and Pulmonary Rehab   Date  10/17/15   Educator  SB2   Instruction Review Code  2- meets goals/outcomes      Diabetes: - Individual verbal and written instruction to review signs/symptoms of diabetes, desired ranges of glucose level fasting, after meals and with exercise. Advice that pre and post exercise glucose checks will be done for 3 sessions at entry of program.      Cardiac Rehab from 10/17/2015 in [facility] Cardiac and Pulmonary Rehab   Date  10/17/15   Educator  SB   Instruction Review Code  2- meets goals/outcomes       Knowledge Questionnaire Score:   Core Components/Risk Factors/Patient Goals at Admission:     Personal Goals and Risk Factors at Admission - 10/17/15 1420    Core Components/Risk Factors/Patient Goals on Admission    Weight Management Yes;Obesity   Intervention Weight Management: Develop a combined nutrition and exercise program designed to reach desired caloric intake, while maintaining appropriate intake of nutrient and fiber, sodium and fats, and appropriate energy expenditure required for the weight goal.;Weight Management: Provide education and appropriate resources to help participant work on and attain dietary goals.;Weight Management/Obesity: Establish reasonable short term and long term weight goals.;Obesity: Provide education and appropriate resources to help participant work on and attain dietary goals.  Already workin on weight loss , was 212 lbs last year.    Admit Weight 189 lb (85.73 kg)   Goal Weight: Short Term 185 lb (83.915 kg)   Goal Weight: Long Term 150 lb (68.04  kg)   Expected Outcomes Short Term: Continue to assess and modify interventions until short term weight is achieved;Long Term: Adherence to nutrition and physical activity/exercise program aimed toward attainment of established weight goal;Weight Loss: Understanding of general recommendations for a balanced deficit meal plan, which promotes 1-2 lb weight loss per week and includes a negative energy balance of [phone] kcal/d   Sedentary Yes   Intervention Provide advice, education, support and counseling about physical activity/exercise needs.;Develop an individualized exercise prescription for aerobic and resistive training based on initial evaluation findings, risk stratification, comorbidities and participant's personal goals.   Expected Outcomes Achievement of increased cardiorespiratory fitness and enhanced flexibility, muscular endurance and strength shown through measurements of functional capacity and personal statement of participant.   Tobacco Cessation Yes   Intervention Assist the participant in steps to quit. Provide individualized education and counseling about committing to Tobacco Cessation, relapse prevention, and pharmacological support that can be provided by physician.   Expected Outcomes Short Term: Will quit all tobacco product use, adhering to prevention of relapse plan.;Long Term: Complete abstinence from all tobacco products for at least 12 months from quit date.;Short Term: Will demonstrate readiness to quit, by selecting a quit date.  IS currently working on  weaning off cigareetes.  Down to 2 cigaretts a day.  Wants to be quit by end of month.    Diabetes Yes   Intervention Provide education about signs/symptoms and action to take for hypo/hyperglycemia.;Provide education about proper nutrition, including hydration, and aerobic/resistive exercise prescription along with prescribed medications to achieve blood glucose in normal ranges: Fasting glucose 65-99 mg/dL   Expected  Outcomes Short Term: Participant verbalizes understanding of the signs/symptoms and immediate care of hyper/hypoglycemia, proper foot care and importance of medication, aerobic/resistive exercise and nutrition plan for blood glucose control.;Long Term: Attainment of HbA1C < 7%.   Heart Failure Yes   Intervention Provide a combined exercise and nutrition program that is supplemented with education, support and counseling about heart failure. Directed toward relieving symptoms such as shortness of breath, decreased exercise tolerance, and extremity edema.   Expected Outcomes Improve functional capacity of life;Short term: Attendance in program 2-3 days a week with increased exercise capacity. Reported lower sodium intake. Reported increased fruit and vegetable intake. Reports medication compliance.;Short term: Daily weights obtained and reported for increase. Utilizing diuretic protocols set by physician.;Long term: Adoption of self-care skills and reduction of barriers for early signs and symptoms recognition and intervention leading to self-care maintenance.   Hypertension Yes   Intervention Provide education on lifestyle modifcations including regular physical activity/exercise, weight management, moderate sodium restriction and increased consumption of fresh fruit, vegetables, and low fat dairy, alcohol moderation, and smoking cessation.   Expected Outcomes Short Term: Continued assessment and intervention until BP is < 140/18mm HG in hypertensive participants. < 130/97mm HG in hypertensive participants with diabetes, heart failure or chronic kidney disease.;Long Term: Maintenance of blood pressure at goal levels.   Lipids Yes   Intervention Provide education and support for participant on nutrition & aerobic/resistive exercise along with prescribed medications to achieve LDL 70mg , HDL >40mg .   Expected Outcomes Short Term: Participant states understanding of desired cholesterol values and is compliant  with medications prescribed. Participant is following exercise prescription and nutrition guidelines.;Long Term: Cholesterol controlled with medications as prescribed, with individualized exercise RX and with personalized nutrition plan. Value goals: LDL < 70mg , HDL > 40 mg.      Core Components/Risk Factors/Patient Goals Review:    Core Components/Risk Factors/Patient Goals at Discharge (Final Review):    ITP Comments:     ITP Comments      10/17/15 1434 10/17/15 1641         ITP Comments Mediccal review completed today. Initial ITP done.  Continue with ITP Mediccal review completed today. Initial ITP done.  Continue with ITP         Comments:

## 2015-10-23 ENCOUNTER — Encounter: Payer: Medicare HMO | Admitting: *Deleted

## 2015-10-23 DIAGNOSIS — I5022 Chronic systolic (congestive) heart failure: Secondary | ICD-10-CM

## 2015-10-23 LAB — GLUCOSE, CAPILLARY: GLUCOSE-CAPILLARY: 97 mg/dL (ref 65–99)

## 2015-10-23 NOTE — Progress Notes (Signed)
Daily Session Note  Patient Details  Name: Alexis Ray MRN: [id number] Date of Birth: 1956/02/12 Referring Provider:    Encounter Date: 10/23/2015  Check In:     Session Check In - 10/23/15 0840    Check-In   Location [facility]-Cardiac & Pulmonary Rehab   Staff Present Heath Lark, RN, BSN, CCRP;Jessica Luan Pulling, MA, ACSM RCEP, Exercise Physiologist;Diane Joya Gaskins, RN, BSN   Supervising physician immediately available to respond to emergencies See telemetry face sheet for immediately available ER MD   Medication changes reported     No   Fall or balance concerns reported    No   Warm-up and Cool-down Performed on first and last piece of equipment   VAD Patient? No   Pain Assessment   Currently in Pain? No/denies         Goals Met:  Personal goals reviewed No report of cardiac concerns or symptoms Strength training completed today  Goals Unmet:  Not Applicable  Comments: Did well today, first full exercise session   Dr. Emily Filbert is Medical Director for [place] and [facility].

## 2015-10-23 NOTE — Patient Instructions (Signed)
Patient Instructions  Patient Details  Name: Alexis Ray MRN: [id number] Date of Birth: Oct 01, 1955 Referring Provider:  Jettie Booze, MD  Below are the personal goals you chose as well as exercise and nutrition goals. Our goal is to help you keep on track towards obtaining and maintaining your goals. We will be discussing your progress on these goals with you throughout the program.  Initial Exercise Prescription:     Initial Exercise Prescription - 10/17/15 1500    Date of Initial Exercise RX and Referring Provider   Date 10/17/15   Oxygen   Oxygen Continuous   Liters 3   NuStep   Level 1   Watts 20   Minutes 15   Biostep-RELP   Level 1   Watts 20   Minutes 15   Prescription Details   Frequency (times per week) 2   Duration Progress to 30 minutes of continuous aerobic without signs/symptoms of physical distress   Intensity   THRR 40-80% of Max Heartrate 108-143   Ratings of Perceived Exertion 11-13   Perceived Dyspnea 0-4   Progression   Progression Continue to progress workloads to maintain intensity without signs/symptoms of physical distress.   Resistance Training   Training Prescription Yes   Weight 1   Reps 10-15      Exercise Goals: Frequency: Be able to perform aerobic exercise three times per week working toward 3-5 days per week.  Intensity: Work with a perceived exertion of 11 (fairly light) - 15 (hard) as tolerated. Follow your new exercise prescription and watch for changes in prescription as you progress with the program. Changes will be reviewed with you when they are made.  Duration: You should be able to do 30 minutes of continuous aerobic exercise in addition to a 5 minute warm-up and a 5 minute cool-down routine.  Nutrition Goals: Your personal nutrition goals will be established when you do your nutrition analysis with the dietician.  The following are nutrition guidelines to follow: Cholesterol < /day Sodium < /day Fiber:  Women over 50 yrs - 21 grams per day  Personal Goals:     Personal Goals and Risk Factors at Admission - 10/17/15 1420    Core Components/Risk Factors/Patient Goals on Admission    Weight Management Yes;Obesity   Intervention Weight Management: Develop a combined nutrition and exercise program designed to reach desired caloric intake, while maintaining appropriate intake of nutrient and fiber, sodium and fats, and appropriate energy expenditure required for the weight goal.;Weight Management: Provide education and appropriate resources to help participant work on and attain dietary goals.;Weight Management/Obesity: Establish reasonable short term and long term weight goals.;Obesity: Provide education and appropriate resources to help participant work on and attain dietary goals.  Already workin on weight loss , was 212 lbs last year.    Admit Weight 189 lb (85.73 kg)   Goal Weight: Short Term 185 lb (83.915 kg)   Goal Weight: Long Term 150 lb (68.04 kg)   Expected Outcomes Short Term: Continue to assess and modify interventions until short term weight is achieved;Long Term: Adherence to nutrition and physical activity/exercise program aimed toward attainment of established weight goal;Weight Loss: Understanding of general recommendations for a balanced deficit meal plan, which promotes 1-2 lb weight loss per week and includes a negative energy balance of [phone] kcal/d   Sedentary Yes   Intervention Provide advice, education, support and counseling about physical activity/exercise needs.;Develop an individualized exercise prescription for aerobic and resistive training based on initial evaluation  findings, risk stratification, comorbidities and participant's personal goals.   Expected Outcomes Achievement of increased cardiorespiratory fitness and enhanced flexibility, muscular endurance and strength shown through measurements of functional capacity and personal statement of participant.   Tobacco  Cessation Yes   Intervention Assist the participant in steps to quit. Provide individualized education and counseling about committing to Tobacco Cessation, relapse prevention, and pharmacological support that can be provided by physician.   Expected Outcomes Short Term: Will quit all tobacco product use, adhering to prevention of relapse plan.;Long Term: Complete abstinence from all tobacco products for at least 12 months from quit date.;Short Term: Will demonstrate readiness to quit, by selecting a quit date.  IS currently working on weaning off cigareetes.  Down to 2 cigaretts a day.  Wants to be quit by end of month.    Diabetes Yes   Intervention Provide education about signs/symptoms and action to take for hypo/hyperglycemia.;Provide education about proper nutrition, including hydration, and aerobic/resistive exercise prescription along with prescribed medications to achieve blood glucose in normal ranges: Fasting glucose 65-99 mg/dL   Expected Outcomes Short Term: Participant verbalizes understanding of the signs/symptoms and immediate care of hyper/hypoglycemia, proper foot care and importance of medication, aerobic/resistive exercise and nutrition plan for blood glucose control.;Long Term: Attainment of HbA1C < 7%.   Heart Failure Yes   Intervention Provide a combined exercise and nutrition program that is supplemented with education, support and counseling about heart failure. Directed toward relieving symptoms such as shortness of breath, decreased exercise tolerance, and extremity edema.   Expected Outcomes Improve functional capacity of life;Short term: Attendance in program 2-3 days a week with increased exercise capacity. Reported lower sodium intake. Reported increased fruit and vegetable intake. Reports medication compliance.;Short term: Daily weights obtained and reported for increase. Utilizing diuretic protocols set by physician.;Long term: Adoption of self-care skills and reduction of  barriers for early signs and symptoms recognition and intervention leading to self-care maintenance.   Hypertension Yes   Intervention Provide education on lifestyle modifcations including regular physical activity/exercise, weight management, moderate sodium restriction and increased consumption of fresh fruit, vegetables, and low fat dairy, alcohol moderation, and smoking cessation.   Expected Outcomes Short Term: Continued assessment and intervention until BP is < 140/4690mm HG in hypertensive participants. < 130/680mm HG in hypertensive participants with diabetes, heart failure or chronic kidney disease.;Long Term: Maintenance of blood pressure at goal levels.   Lipids Yes   Intervention Provide education and support for participant on nutrition & aerobic/resistive exercise along with prescribed medications to achieve LDL 70mg , HDL >40mg .   Expected Outcomes Short Term: Participant states understanding of desired cholesterol values and is compliant with medications prescribed. Participant is following exercise prescription and nutrition guidelines.;Long Term: Cholesterol controlled with medications as prescribed, with individualized exercise RX and with personalized nutrition plan. Value goals: LDL < 70mg , HDL > 40 mg.      Tobacco Use Initial Evaluation: History  Smoking status  . Current Every Day Smoker  Smokeless tobacco  . Never Used    Copy of goals given to participant.

## 2015-10-24 ENCOUNTER — Encounter: Payer: Self-pay | Admitting: *Deleted

## 2015-10-24 DIAGNOSIS — I5022 Chronic systolic (congestive) heart failure: Secondary | ICD-10-CM

## 2015-10-24 NOTE — Progress Notes (Signed)
Cardiac Individual Treatment Plan  Patient Details  Name: Alexis Ray MRN: [id number] Date of Birth: 03/30/1956 Referring Provider:    Initial Encounter Date:       Cardiac Rehab from 10/17/2015 in [facility] Cardiac and Pulmonary Rehab   Date  10/17/15      Visit Diagnosis: Heart failure, chronic systolic (HCC)  Patient's Home Medications on Admission:  Current outpatient prescriptions:  .  albuterol (PROVENTIL) (2.5 MG/3ML) 0.083% nebulizer solution, Take 3 mLs (2.5 mg total) by nebulization every 6 (six) hours as needed for wheezing or shortness of breath., Disp: 75 mL, Rfl: 2 .  albuterol (VENTOLIN HFA) 108 (90 Base) MCG/ACT inhaler, Inhale into the lungs every 6 (six) hours as needed for wheezing or shortness of breath., Disp: , Rfl:  .  aspirin EC 81 MG tablet, Take 81 mg by mouth daily., Disp: , Rfl:  .  budesonide-formoterol (SYMBICORT) 160-4.5 MCG/ACT inhaler, Inhale 2 puffs into the lungs 2 (two) times daily., Disp: , Rfl:  .  carvedilol (COREG) 25 MG tablet, Take 12.5 mg by mouth 2 (two) times daily with a meal., Disp: , Rfl:  .  cilostazol (PLETAL) 100 MG tablet, Take 100 mg by mouth 2 (two) times daily., Disp: , Rfl:  .  diphenhydrAMINE (BENADRYL) 25 mg capsule, Take 25 mg by mouth every 6 (six) hours as needed., Disp: , Rfl:  .  docusate sodium (COLACE) 100 MG capsule, Take 100 mg by mouth daily as needed for mild constipation., Disp: , Rfl:  .  esomeprazole (NEXIUM) 40 MG capsule, Take 40 mg by mouth daily at 12 noon., Disp: , Rfl:  .  furosemide (LASIX) 40 MG tablet, Take 1 tablet (40 mg total) by mouth 2 (two) times daily., Disp: 60 tablet, Rfl: 5 .  insulin aspart (NOVOLOG) 100 UNIT/ML injection, Inject 10 Units into the skin 3 (three) times daily with meals., Disp: , Rfl:  .  insulin glargine (LANTUS) 100 UNIT/ML injection, Inject 40 Units into the skin at bedtime., Disp: , Rfl:  .  levocetirizine (XYZAL) 5 MG tablet, Take 5 mg by mouth at bedtime. , Disp: , Rfl:  .   metFORMIN (GLUCOPHAGE) 1000 MG tablet, Take 1,000 mg by mouth 2 (two) times daily with a meal., Disp: , Rfl:  .  Multiple Vitamins-Minerals (SENTRY SENIOR) TABS, Take 1 tablet by mouth daily., Disp: , Rfl:  .  nitroGLYCERIN (NITROSTAT) 0.4 MG SL tablet, Place 0.4 mg under the tongue every 5 (five) minutes as needed for chest pain., Disp: , Rfl:  .  pregabalin (LYRICA) 150 MG capsule, Take 150 mg by mouth 3 (three) times daily. , Disp: , Rfl:  .  rosuvastatin (CRESTOR) 40 MG tablet, Take 40 mg by mouth daily., Disp: , Rfl:  .  sacubitril-valsartan (ENTRESTO) 24-26 MG, Take 1 tablet by mouth 2 (two) times daily., Disp: 180 tablet, Rfl: 3 .  sacubitril-valsartan (ENTRESTO) 24-26 MG, Take 1 tablet by mouth 2 (two) times daily., Disp: 60 tablet, Rfl: 0 .  spironolactone (ALDACTONE) 50 MG tablet, Take 50 mg by mouth daily., Disp: , Rfl:  .  tiotropium (SPIRIVA) 18 MCG inhalation capsule, Place 18 mcg into inhaler and inhale daily., Disp: , Rfl:  .  Venlafaxine HCl 225 MG TB24, Take 225 mg by mouth daily. , Disp: , Rfl:   Past Medical History: Past Medical History  Diagnosis Date  . Diabetes mellitus without complication (HCC)   . Hypertension   . Chronic combined systolic and diastolic CHF (congestive heart  failure) (HCC)   . Stroke (cerebrum) (HCC)   . COPD (chronic obstructive pulmonary disease) (HCC)   . GERD (gastroesophageal reflux disease)   . PVD (peripheral vascular disease) (HCC)   . CAD (coronary artery disease)     Tobacco Use: History  Smoking status  . Current Every Day Smoker  Smokeless tobacco  . Never Used    Labs: Recent Review Flowsheet Data    Labs for ITP Cardiac and Pulmonary Rehab Latest Ref Rng 08/25/2011 01/22/2015 05/24/2015   Hemoglobin A1c 4.0 - 6.0 % 10.7(H) - 6.8(H)   PHART 7.350 - 7.450 - 7.43 -   PCO2ART 32.0 - 48.0 mmHg - 30(L) -   HCO3 21.0 - 28.0 mEq/L - 19.9(L) -   ACIDBASEDEF 0.0 - 2.0 mmol/L - 3.3(H) -   O2SAT - - 96.1 -       Exercise Target  Goals:    Exercise Program Goal: Individual exercise prescription set with THRR, safety & activity barriers. Participant demonstrates ability to understand and report RPE using BORG scale, to self-measure pulse accurately, and to acknowledge the importance of the exercise prescription.  Exercise Prescription Goal: Starting with aerobic activity 30 plus minutes a day, 3 days per week for initial exercise prescription. Provide home exercise prescription and guidelines that participant acknowledges understanding prior to discharge.  Activity Barriers & Risk Stratification:     Activity Barriers & Cardiac Risk Stratification - 10/17/15 1432    Activity Barriers & Cardiac Risk Stratification   Activity Barriers Shortness of Breath;Neck/Spine Problems;Assistive Device  Has spurs in her back.  Concerned about her back pain increasing with more exercise. Does have a walker to help her manage to stop and rest when needed if she is out walking.    Cardiac Risk Stratification High      6 Minute Walk:     6 Minute Walk      10/17/15 1535       6 Minute Walk   Distance 400 feet     Walk Time 2.9 minutes     # of Rest Breaks 1     MPH 1.7     METS 1.75     RPE 13     Perceived Dyspnea  4     Symptoms Yes (comment)     Comments Bcak pain due to bone spurs is the reason Mone had to stop     Resting HR 74 bpm     Resting BP 132/82 mmHg     Max Ex. HR 103 bpm     Max Ex. BP 126/60 mmHg        Initial Exercise Prescription:     Initial Exercise Prescription - 10/17/15 1500    Date of Initial Exercise RX and Referring Provider   Date 10/17/15   Oxygen   Oxygen Continuous   Liters 3   NuStep   Level 1   Watts 20   Minutes 15   Biostep-RELP   Level 1   Watts 20   Minutes 15   Prescription Details   Frequency (times per week) 2   Duration Progress to 30 minutes of continuous aerobic without signs/symptoms of physical distress   Intensity   THRR 40-80% of Max Heartrate  108-143   Ratings of Perceived Exertion 11-13   Perceived Dyspnea 0-4   Progression   Progression Continue to progress workloads to maintain intensity without signs/symptoms of physical distress.   Resistance Training   Training Prescription Yes   Weight  1   Reps 10-15      Perform Capillary Blood Glucose checks as needed.  Exercise Prescription Changes:     Exercise Prescription Changes      10/17/15 1500           Response to Exercise   Blood Pressure (Admit) 132/82 mmHg       Blood Pressure (Exercise) 126/60 mmHg       Blood Pressure (Exit) 118/70 mmHg       Heart Rate (Admit) 74 bpm       Heart Rate (Exercise) 96 bpm       Heart Rate (Exit) 73 bpm       Rating of Perceived Exertion (Exercise) 13       Perceived Dyspnea (Exercise) 4          Exercise Comments:     Exercise Comments      10/23/15 0840           Exercise Comments First full day of exercise!  Patient was oriented to gym and equipment including functions, settings, policies, and procedures.  Patient's individual exercise prescription and treatment plan were reviewed.  All starting workloads were established based on the results of the 6 minute walk test done at initial orientation visit.  The plan for exercise progression was also introduced and progression will be customized based on patient's performance and goals.          Discharge Exercise Prescription (Final Exercise Prescription Changes):     Exercise Prescription Changes - 10/17/15 1500    Response to Exercise   Blood Pressure (Admit) 132/82 mmHg   Blood Pressure (Exercise) 126/60 mmHg   Blood Pressure (Exit) 118/70 mmHg   Heart Rate (Admit) 74 bpm   Heart Rate (Exercise) 96 bpm   Heart Rate (Exit) 73 bpm   Rating of Perceived Exertion (Exercise) 13   Perceived Dyspnea (Exercise) 4      Nutrition:  Target Goals: Understanding of nutrition guidelines, daily intake of sodium 1500mg , cholesterol 200mg , calories 30% from fat and  7% or less from saturated fats, daily to have 5 or more servings of fruits and vegetables.  Biometrics:     Pre Biometrics - 10/17/15 1532    Pre Biometrics   Height 5' 5.5" (1.664 m)   Weight 189 lb (85.73 kg)   Waist Circumference 47 inches   Hip Circumference 42.5 inches   Waist to Hip Ratio 1.11 %   BMI (Calculated) 31   Single Leg Stand 3 seconds       Nutrition Therapy Plan and Nutrition Goals:     Nutrition Therapy & Goals - 10/17/15 1418    Intervention Plan   Intervention Prescribe, educate and counsel regarding individualized specific dietary modifications aiming towards targeted core components such as weight, hypertension, lipid management, diabetes, heart failure and other comorbidities.  Krystyl is working with [employer] weekly to review nutrition and any other medical needs.    Expected Outcomes Short Term Goal: A plan has been developed with personal nutrition goals set during dietitian appointment.;Short Term Goal: Understand basic principles of dietary content, such as calories, fat, sodium, cholesterol and nutrients.;Long Term Goal: Adherence to prescribed nutrition plan.      Nutrition Discharge: Rate Your Plate Scores:   Nutrition Goals Re-Evaluation:   Psychosocial: Target Goals: Acknowledge presence or absence of depression, maximize coping skills, provide positive support system. Participant is able to verbalize types and ability to use techniques and skills needed for reducing stress  and depression.  Initial Review & Psychosocial Screening:     Initial Psych Review & Screening - 10/17/15 1425    Initial Review   Current issues with Current Sleep Concerns  Does not sleep good,  always wakes up early and takes time  to get back to sleep if tries to go back to sleep.   Not a new action.    Family Dynamics   Good Support System? Yes  2 daughters, grand children nearby and help as needed.   One lives with Alexis Ray and one in close range.    Barriers   Psychosocial barriers to participate in program There are no identifiable barriers or psychosocial needs.;The patient should benefit from training in stress management and relaxation.   Screening Interventions   Interventions Encouraged to exercise      Quality of Life Scores:   PHQ-9:     Recent Review Flowsheet Data    [facility] 10/17/2015 07/09/2015 06/07/2015   Decreased Interest 0 0 0   Down, Depressed, Hopeless 0 0 0   PHQ - 2 Score 0 0 0   Altered sleeping 0 - -   Tired, decreased energy 0 - -   Change in appetite 0 - -   Feeling bad or failure about yourself  0 - -   Trouble concentrating 0 - -   Moving slowly or fidgety/restless 0 - -   Suicidal thoughts 0 - -   PHQ-9 Score 0 - -   Difficult doing work/chores Not difficult at all - -      Psychosocial Evaluation and Intervention:   Psychosocial Re-Evaluation:   Vocational Rehabilitation: Provide vocational rehab assistance to qualifying candidates.   Vocational Rehab Evaluation & Intervention:     Vocational Rehab - 10/17/15 1432    Initial Vocational Rehab Evaluation & Intervention   Assessment shows need for Vocational Rehabilitation No      Education: Education Goals: Education classes will be provided on a weekly basis, covering required topics. Participant will state understanding/return demonstration of topics presented.  Learning Barriers/Preferences:     Learning Barriers/Preferences - 10/17/15 1431    Learning Barriers/Preferences   Learning Barriers Exercise Concerns   Learning Preferences None      Education Topics: General Nutrition Guidelines/Fats and Fiber: -Group instruction provided by verbal, written material, models and posters to present the general guidelines for heart healthy nutrition. Gives an explanation and review of dietary fats and fiber.   Controlling Sodium/Reading Food Labels: -Group verbal and written material supporting the discussion of  sodium use in heart healthy nutrition. Review and explanation with models, verbal and written materials for utilization of the food label.   Exercise Physiology & Risk Factors: - Group verbal and written instruction with models to review the exercise physiology of the cardiovascular system and associated critical values. Details cardiovascular disease risk factors and the goals associated with each risk factor.   Aerobic Exercise & Resistance Training: - Gives group verbal and written discussion on the health impact of inactivity. On the components of aerobic and resistive training programs and the benefits of this training and how to safely progress through these programs.          [facility]   Date  10/23/15   [facility]   Instruction Review Code  2- meets goals/outcomes      Flexibility, Balance, General Exercise Guidelines: - Provides group verbal and written instruction on the  benefits of flexibility and balance training programs. Provides general exercise guidelines with specific guidelines to those with heart or lung disease. Demonstration and skill practice provided.   Stress Management: - Provides group verbal and written instruction about the health risks of elevated stress, cause of high stress, and healthy ways to reduce stress.   Depression: - Provides group verbal and written instruction on the correlation between heart/lung disease and depressed mood, treatment options, and the stigmas associated with seeking treatment.   Anatomy & Physiology of the Heart: - Group verbal and written instruction and models provide basic cardiac anatomy and physiology, with the coronary electrical and arterial systems. Review of: AMI, Angina, Valve disease, Heart Failure, Cardiac Arrhythmia, Pacemakers, and the ICD.   Cardiac Procedures: - Group verbal and written instruction and models to describe the testing methods done to  diagnose heart disease. Reviews the outcomes of the test results. Describes the treatment choices: Medical Management, Angioplasty, or Coronary Bypass Surgery.   Cardiac Medications: - Group verbal and written instruction to review commonly prescribed medications for heart disease. Reviews the medication, class of the drug, and side effects. Includes the steps to properly store meds and maintain the prescription regimen.   Go Sex-Intimacy & Heart Disease, Get SMART - Goal Setting: - Group verbal and written instruction through game format to discuss heart disease and the return to sexual intimacy. Provides group verbal and written material to discuss and apply goal setting through the application of the S.M.A.R.T. Method.   Other Matters of the Heart: - Provides group verbal, written materials and models to describe Heart Failure, Angina, Valve Disease, and Diabetes in the realm of heart disease. Includes description of the disease process and treatment options available to the cardiac patient.   Exercise & Equipment Safety: - Individual verbal instruction and demonstration of equipment use and safety with use of the equipment.      Cardiac Rehab from 10/23/2015 in [facility] Cardiac and Pulmonary Rehab   Date  10/17/15   Educator  SB   Instruction Review Code  2- meets goals/outcomes      Infection Prevention: - Provides verbal and written material to individual with discussion of infection control including proper hand washing and proper equipment cleaning during exercise session.      Cardiac Rehab from 10/23/2015 in [facility] Cardiac and Pulmonary Rehab   Date  10/17/15   Educator  SB   Instruction Review Code  2- meets goals/outcomes      Falls Prevention: - Provides verbal and written material to individual with discussion of falls prevention and safety.      Cardiac Rehab from 10/23/2015 in [facility] Cardiac and Pulmonary Rehab   Date  10/17/15   Educator  SB2   Instruction Review Code  2-  meets goals/outcomes      Diabetes: - Individual verbal and written instruction to review signs/symptoms of diabetes, desired ranges of glucose level fasting, after meals and with exercise. Advice that pre and post exercise glucose checks will be done for 3 sessions at entry of program.      Cardiac Rehab from 10/23/2015 in [facility] Cardiac and Pulmonary Rehab   Date  10/17/15   Educator  SB   Instruction Review Code  2- meets goals/outcomes       Knowledge Questionnaire Score:   Core Components/Risk Factors/Patient Goals at Admission:     Personal Goals and Risk Factors at Admission - 10/17/15 1420    Core Components/Risk Factors/Patient Goals on Admission  Weight Management Yes;Obesity   Intervention Weight Management: Develop a combined nutrition and exercise program designed to reach desired caloric intake, while maintaining appropriate intake of nutrient and fiber, sodium and fats, and appropriate energy expenditure required for the weight goal.;Weight Management: Provide education and appropriate resources to help participant work on and attain dietary goals.;Weight Management/Obesity: Establish reasonable short term and long term weight goals.;Obesity: Provide education and appropriate resources to help participant work on and attain dietary goals.  Already workin on weight loss , was 212 lbs last year.    Admit Weight 189 lb (85.73 kg)   Goal Weight: Short Term 185 lb (83.915 kg)   Goal Weight: Long Term 150 lb (68.04 kg)   Expected Outcomes Short Term: Continue to assess and modify interventions until short term weight is achieved;Long Term: Adherence to nutrition and physical activity/exercise program aimed toward attainment of established weight goal;Weight Loss: Understanding of general recommendations for a balanced deficit meal plan, which promotes 1-2 lb weight loss per week and includes a negative energy balance of [phone] kcal/d   Sedentary Yes   Intervention Provide  advice, education, support and counseling about physical activity/exercise needs.;Develop an individualized exercise prescription for aerobic and resistive training based on initial evaluation findings, risk stratification, comorbidities and participant's personal goals.   Expected Outcomes Achievement of increased cardiorespiratory fitness and enhanced flexibility, muscular endurance and strength shown through measurements of functional capacity and personal statement of participant.   Tobacco Cessation Yes   Intervention Assist the participant in steps to quit. Provide individualized education and counseling about committing to Tobacco Cessation, relapse prevention, and pharmacological support that can be provided by physician.   Expected Outcomes Short Term: Will quit all tobacco product use, adhering to prevention of relapse plan.;Long Term: Complete abstinence from all tobacco products for at least 12 months from quit date.;Short Term: Will demonstrate readiness to quit, by selecting a quit date.  IS currently working on weaning off cigareetes.  Down to 2 cigaretts a day.  Wants to be quit by end of month.    Diabetes Yes   Intervention Provide education about signs/symptoms and action to take for hypo/hyperglycemia.;Provide education about proper nutrition, including hydration, and aerobic/resistive exercise prescription along with prescribed medications to achieve blood glucose in normal ranges: Fasting glucose 65-99 mg/dL   Expected Outcomes Short Term: Participant verbalizes understanding of the signs/symptoms and immediate care of hyper/hypoglycemia, proper foot care and importance of medication, aerobic/resistive exercise and nutrition plan for blood glucose control.;Long Term: Attainment of HbA1C < 7%.   Heart Failure Yes   Intervention Provide a combined exercise and nutrition program that is supplemented with education, support and counseling about heart failure. Directed toward relieving  symptoms such as shortness of breath, decreased exercise tolerance, and extremity edema.   Expected Outcomes Improve functional capacity of life;Short term: Attendance in program 2-3 days a week with increased exercise capacity. Reported lower sodium intake. Reported increased fruit and vegetable intake. Reports medication compliance.;Short term: Daily weights obtained and reported for increase. Utilizing diuretic protocols set by physician.;Long term: Adoption of self-care skills and reduction of barriers for early signs and symptoms recognition and intervention leading to self-care maintenance.   Hypertension Yes   Intervention Provide education on lifestyle modifcations including regular physical activity/exercise, weight management, moderate sodium restriction and increased consumption of fresh fruit, vegetables, and low fat dairy, alcohol moderation, and smoking cessation.   Expected Outcomes Short Term: Continued assessment and intervention until BP is < 140/19mm HG in hypertensive  participants. < 130/59mm HG in hypertensive participants with diabetes, heart failure or chronic kidney disease.;Long Term: Maintenance of blood pressure at goal levels.   Lipids Yes   Intervention Provide education and support for participant on nutrition & aerobic/resistive exercise along with prescribed medications to achieve LDL 70mg , HDL >40mg .   Expected Outcomes Short Term: Participant states understanding of desired cholesterol values and is compliant with medications prescribed. Participant is following exercise prescription and nutrition guidelines.;Long Term: Cholesterol controlled with medications as prescribed, with individualized exercise RX and with personalized nutrition plan. Value goals: LDL < 70mg , HDL > 40 mg.      Core Components/Risk Factors/Patient Goals Review:    Core Components/Risk Factors/Patient Goals at Discharge (Final Review):    ITP Comments:     ITP Comments      10/17/15 1434  10/17/15 1641 10/24/15 0850       ITP Comments Mediccal review completed today. Initial ITP done.  Continue with ITP Mediccal review completed today. Initial ITP done.  Continue with ITP 30 day review.  Continue with ITP   New to program        Comments:

## 2015-10-30 ENCOUNTER — Encounter: Payer: Self-pay | Admitting: *Deleted

## 2015-10-30 DIAGNOSIS — I5022 Chronic systolic (congestive) heart failure: Secondary | ICD-10-CM

## 2015-10-30 NOTE — Progress Notes (Signed)
Patient called department to let staff know she missed class because she has not been feeling well and having difficulty sleeping at night.  She reported she did not get to sleep until 3 a.m. this morning.  Therefore, she was unable to attend class this morning.  Hopes to return soon.

## 2015-11-01 ENCOUNTER — Encounter: Payer: Medicare HMO | Admitting: *Deleted

## 2015-11-01 DIAGNOSIS — I5022 Chronic systolic (congestive) heart failure: Secondary | ICD-10-CM

## 2015-11-01 LAB — GLUCOSE, CAPILLARY: GLUCOSE-CAPILLARY: 135 mg/dL — AB (ref 65–99)

## 2015-11-01 NOTE — Progress Notes (Signed)
Daily Session Note  Patient Details  Name: JAVONNA BALLI MRN: [id number] Date of Birth: 05-27-1955 Referring Provider:    Encounter Date: 11/01/2015  Check In:     Session Check In - 11/01/15 0945    Check-In   Location [facility]-Cardiac & Pulmonary Rehab   Staff Present Nada Maclachlan, BA, ACSM CEP, Exercise Physiologist;Ethaniel Garfield, RN, Jana Half, RN, BSN   Supervising physician immediately available to respond to emergencies See telemetry face sheet for immediately available ER MD   Medication changes reported     No   Fall or balance concerns reported    No   Warm-up and Cool-down Performed on first and last piece of equipment   Resistance Training Performed Yes   VAD Patient? No   Pain Assessment   Currently in Pain? No/denies           Exercise Prescription Changes - 10/31/15 1300    Response to Exercise   Blood Pressure (Admit) 128/66 mmHg   Blood Pressure (Exercise) 128/68 mmHg   Blood Pressure (Exit) 112/64 mmHg   Heart Rate (Admit) 75 bpm   Heart Rate (Exercise) 90 bpm   Heart Rate (Exit) 71 bpm   Rating of Perceived Exertion (Exercise) 13   Treadmill   MPH 1   Grade 0   Minutes 5   NuStep   Level 2   Watts 11   Minutes 15      Goals Met:  Proper associated with RPD/PD & O2 Sat Exercise tolerated well No report of cardiac concerns or symptoms  Goals Unmet:  Not Applicable  Comments:     Dr. Emily Filbert is Medical Director for [facility] and [facility].

## 2015-11-08 ENCOUNTER — Encounter: Payer: Medicare HMO | Attending: Internal Medicine

## 2015-11-08 DIAGNOSIS — Z9889 Other specified postprocedural states: Secondary | ICD-10-CM | POA: Insufficient documentation

## 2015-11-08 DIAGNOSIS — I2510 Atherosclerotic heart disease of native coronary artery without angina pectoris: Secondary | ICD-10-CM | POA: Insufficient documentation

## 2015-11-08 DIAGNOSIS — J449 Chronic obstructive pulmonary disease, unspecified: Secondary | ICD-10-CM | POA: Insufficient documentation

## 2015-11-08 DIAGNOSIS — I10 Essential (primary) hypertension: Secondary | ICD-10-CM | POA: Insufficient documentation

## 2015-11-08 DIAGNOSIS — I5022 Chronic systolic (congestive) heart failure: Secondary | ICD-10-CM | POA: Insufficient documentation

## 2015-11-13 ENCOUNTER — Encounter: Payer: Self-pay | Admitting: *Deleted

## 2015-11-13 DIAGNOSIS — I5022 Chronic systolic (congestive) heart failure: Secondary | ICD-10-CM

## 2015-11-21 ENCOUNTER — Encounter: Payer: Self-pay | Admitting: *Deleted

## 2015-11-21 DIAGNOSIS — I5022 Chronic systolic (congestive) heart failure: Secondary | ICD-10-CM

## 2015-11-21 NOTE — Progress Notes (Signed)
Cardiac Individual Treatment Plan  Patient Details  Name: Alexis Ray MRN: [id number] Date of Birth: 03/30/1956 Referring Provider:    Initial Encounter Date:       Cardiac Rehab from 10/17/2015 in [facility] Cardiac and Pulmonary Rehab   Date  10/17/15      Visit Diagnosis: Heart failure, chronic systolic (HCC)  Patient's Home Medications on Admission:  Current outpatient prescriptions:  .  albuterol (PROVENTIL) (2.5 MG/3ML) 0.083% nebulizer solution, Take 3 mLs (2.5 mg total) by nebulization every 6 (six) hours as needed for wheezing or shortness of breath., Disp: 75 mL, Rfl: 2 .  albuterol (VENTOLIN HFA) 108 (90 Base) MCG/ACT inhaler, Inhale into the lungs every 6 (six) hours as needed for wheezing or shortness of breath., Disp: , Rfl:  .  aspirin EC 81 MG tablet, Take 81 mg by mouth daily., Disp: , Rfl:  .  budesonide-formoterol (SYMBICORT) 160-4.5 MCG/ACT inhaler, Inhale 2 puffs into the lungs 2 (two) times daily., Disp: , Rfl:  .  carvedilol (COREG) 25 MG tablet, Take 12.5 mg by mouth 2 (two) times daily with a meal., Disp: , Rfl:  .  cilostazol (PLETAL) 100 MG tablet, Take 100 mg by mouth 2 (two) times daily., Disp: , Rfl:  .  diphenhydrAMINE (BENADRYL) 25 mg capsule, Take 25 mg by mouth every 6 (six) hours as needed., Disp: , Rfl:  .  docusate sodium (COLACE) 100 MG capsule, Take 100 mg by mouth daily as needed for mild constipation., Disp: , Rfl:  .  esomeprazole (NEXIUM) 40 MG capsule, Take 40 mg by mouth daily at 12 noon., Disp: , Rfl:  .  furosemide (LASIX) 40 MG tablet, Take 1 tablet (40 mg total) by mouth 2 (two) times daily., Disp: 60 tablet, Rfl: 5 .  insulin aspart (NOVOLOG) 100 UNIT/ML injection, Inject 10 Units into the skin 3 (three) times daily with meals., Disp: , Rfl:  .  insulin glargine (LANTUS) 100 UNIT/ML injection, Inject 40 Units into the skin at bedtime., Disp: , Rfl:  .  levocetirizine (XYZAL) 5 MG tablet, Take 5 mg by mouth at bedtime. , Disp: , Rfl:  .   metFORMIN (GLUCOPHAGE) 1000 MG tablet, Take 1,000 mg by mouth 2 (two) times daily with a meal., Disp: , Rfl:  .  Multiple Vitamins-Minerals (SENTRY SENIOR) TABS, Take 1 tablet by mouth daily., Disp: , Rfl:  .  nitroGLYCERIN (NITROSTAT) 0.4 MG SL tablet, Place 0.4 mg under the tongue every 5 (five) minutes as needed for chest pain., Disp: , Rfl:  .  pregabalin (LYRICA) 150 MG capsule, Take 150 mg by mouth 3 (three) times daily. , Disp: , Rfl:  .  rosuvastatin (CRESTOR) 40 MG tablet, Take 40 mg by mouth daily., Disp: , Rfl:  .  sacubitril-valsartan (ENTRESTO) 24-26 MG, Take 1 tablet by mouth 2 (two) times daily., Disp: 180 tablet, Rfl: 3 .  sacubitril-valsartan (ENTRESTO) 24-26 MG, Take 1 tablet by mouth 2 (two) times daily., Disp: 60 tablet, Rfl: 0 .  spironolactone (ALDACTONE) 50 MG tablet, Take 50 mg by mouth daily., Disp: , Rfl:  .  tiotropium (SPIRIVA) 18 MCG inhalation capsule, Place 18 mcg into inhaler and inhale daily., Disp: , Rfl:  .  Venlafaxine HCl 225 MG TB24, Take 225 mg by mouth daily. , Disp: , Rfl:   Past Medical History: Past Medical History  Diagnosis Date  . Diabetes mellitus without complication (HCC)   . Hypertension   . Chronic combined systolic and diastolic CHF (congestive heart  failure) (HCC)   . Stroke (cerebrum) (HCC)   . COPD (chronic obstructive pulmonary disease) (HCC)   . GERD (gastroesophageal reflux disease)   . PVD (peripheral vascular disease) (HCC)   . CAD (coronary artery disease)     Tobacco Use: History  Smoking status  . Current Every Day Smoker  Smokeless tobacco  . Never Used    Labs: Recent Review Flowsheet Data    Labs for ITP Cardiac and Pulmonary Rehab Latest Ref Rng 08/25/2011 01/22/2015 05/24/2015   Hemoglobin A1c 4.0 - 6.0 % 10.7(H) - 6.8(H)   PHART 7.350 - 7.450 - 7.43 -   PCO2ART 32.0 - 48.0 mmHg - 30(L) -   HCO3 21.0 - 28.0 mEq/L - 19.9(L) -   ACIDBASEDEF 0.0 - 2.0 mmol/L - 3.3(H) -   O2SAT - - 96.1 -       Exercise Target  Goals:    Exercise Program Goal: Individual exercise prescription set with THRR, safety & activity barriers. Participant demonstrates ability to understand and report RPE using BORG scale, to self-measure pulse accurately, and to acknowledge the importance of the exercise prescription.  Exercise Prescription Goal: Starting with aerobic activity 30 plus minutes a day, 3 days per week for initial exercise prescription. Provide home exercise prescription and guidelines that participant acknowledges understanding prior to discharge.  Activity Barriers & Risk Stratification:     Activity Barriers & Cardiac Risk Stratification - 10/17/15 1432    Activity Barriers & Cardiac Risk Stratification   Activity Barriers Shortness of Breath;Neck/Spine Problems;Assistive Device  Has spurs in her back.  Concerned about her back pain increasing with more exercise. Does have a walker to help her manage to stop and rest when needed if she is out walking.    Cardiac Risk Stratification High      6 Minute Walk:     6 Minute Walk      10/17/15 1535       6 Minute Walk   Distance 400 feet     Walk Time 2.9 minutes     # of Rest Breaks 1     MPH 1.7     METS 1.75     RPE 13     Perceived Dyspnea  4     Symptoms Yes (comment)     Comments Bcak pain due to bone spurs is the reason Mone had to stop     Resting HR 74 bpm     Resting BP 132/82 mmHg     Max Ex. HR 103 bpm     Max Ex. BP 126/60 mmHg        Initial Exercise Prescription:     Initial Exercise Prescription - 10/17/15 1500    Date of Initial Exercise RX and Referring Provider   Date 10/17/15   Oxygen   Oxygen Continuous   Liters 3   NuStep   Level 1   Watts 20   Minutes 15   Biostep-RELP   Level 1   Watts 20   Minutes 15   Prescription Details   Frequency (times per week) 2   Duration Progress to 30 minutes of continuous aerobic without signs/symptoms of physical distress   Intensity   THRR 40-80% of Max Heartrate  108-143   Ratings of Perceived Exertion 11-13   Perceived Dyspnea 0-4   Progression   Progression Continue to progress workloads to maintain intensity without signs/symptoms of physical distress.   Resistance Training   Training Prescription Yes   Weight  1   Reps 10-15      Perform Capillary Blood Glucose checks as needed.  Exercise Prescription Changes:     Exercise Prescription Changes      10/17/15 1500 10/31/15 1300 11/13/15 1300       Exercise Review   Progression   Yes     Response to Exercise   Blood Pressure (Admit) 132/82 mmHg 128/66 mmHg 118/60 mmHg     Blood Pressure (Exercise) 126/60 mmHg 128/68 mmHg 128/58 mmHg     Blood Pressure (Exit) 118/70 mmHg 112/64 mmHg 102/58 mmHg     Heart Rate (Admit) 74 bpm 75 bpm 88 bpm     Heart Rate (Exercise) 96 bpm 90 bpm 78 bpm     Heart Rate (Exit) 73 bpm 71 bpm 76 bpm     Oxygen Saturation (Exercise)   99 %     Rating of Perceived Exertion (Exercise) 13 13 13      Perceived Dyspnea (Exercise) 4       Duration   Progress to 45 minutes of aerobic exercise without signs/symptoms of physical distress     Intensity   THRR unchanged     Progression   Progression   Continue to progress workloads to maintain intensity without signs/symptoms of physical distress.     Average METs   1.7     Resistance Training   Training Prescription   Yes     Weight   1     Reps   10-15     Interval Training   Interval Training   No     Oxygen   Oxygen   Continuous     Liters   3     Treadmill   MPH  1      Grade  0      Minutes  5      NuStep   Level  2 2     Watts  11 --  1.7 METs     Minutes  15 22        Exercise Comments:     Exercise Comments      10/23/15 0840 11/13/15 1354         Exercise Comments First full day of exercise!  Patient was oriented to gym and equipment including functions, settings, policies, and procedures.  Patient's individual exercise prescription and treatment plan were reviewed.  All starting  workloads were established based on the results of the 6 minute walk test done at initial orientation visit.  The plan for exercise progression was also introduced and progression will be customized based on patient's performance and goals. Pt has only attended once since last review and stayed on NuStep for the entire time that day.  She is off to a good start and we will continue to monitor for progression.         Discharge Exercise Prescription (Final Exercise Prescription Changes):     Exercise Prescription Changes - 11/13/15 1300    Exercise Review   Progression Yes   Response to Exercise   Blood Pressure (Admit) 118/60 mmHg   Blood Pressure (Exercise) 128/58 mmHg   Blood Pressure (Exit) 102/58 mmHg   Heart Rate (Admit) 88 bpm   Heart Rate (Exercise) 78 bpm   Heart Rate (Exit) 76 bpm   Oxygen Saturation (Exercise) 99 %   Rating of Perceived Exertion (Exercise) 13   Duration Progress to 45 minutes of aerobic exercise without signs/symptoms of physical distress   Intensity THRR  unchanged   Progression   Progression Continue to progress workloads to maintain intensity without signs/symptoms of physical distress.   Average METs 1.7   Resistance Training   Training Prescription Yes   Weight 1   Reps 10-15   Interval Training   Interval Training No   Oxygen   Oxygen Continuous   Liters 3   NuStep   Level 2   Watts --  1.7 METs   Minutes 22      Nutrition:  Target Goals: Understanding of nutrition guidelines, daily intake of sodium 1500mg , cholesterol 200mg , calories 30% from fat and 7% or less from saturated fats, daily to have 5 or more servings of fruits and vegetables.  Biometrics:     Pre Biometrics - 10/17/15 1532    Pre Biometrics   Height 5' 5.5" (1.664 m)   Weight 189 lb (85.73 kg)   Waist Circumference 47 inches   Hip Circumference 42.5 inches   Waist to Hip Ratio 1.11 %   BMI (Calculated) 31   Single Leg Stand 3 seconds       Nutrition Therapy  Plan and Nutrition Goals:     Nutrition Therapy & Goals - 11/01/15 1119    Nutrition Therapy   Diet --  On insulin is counting carbs as the [employer] suggested to [place]..    Drug/Food Interactions Statins/Certain Fruits  On insulin is counting carbs as the [employer] suggested to [place].    Personal Nutrition Goals   Personal Goal #1 Cont to count carbs as the [facility] dietician instructed her to.    Intervention Plan   Intervention Prescribe, educate and counsel regarding individualized specific dietary modifications aiming towards targeted core components such as weight, hypertension, lipid management, diabetes, heart failure and other comorbidities.   Expected Outcomes Short Term Goal: Understand basic principles of dietary content, such as calories, fat, sodium, cholesterol and nutrients.;Long Term Goal: Adherence to prescribed nutrition plan.      Nutrition Discharge: Rate Your Plate Scores:   Nutrition Goals Re-Evaluation:     Nutrition Goals Re-Evaluation      11/01/15 1122           Personal Goal #1 Re-Evaluation   Goal Progress Seen Yes       Comments On insulin is counting carbs as the [employer] suggested to [place] . Billiejo  said she doesn't need to meet individually with the Cardiac Rehab Registered Dietician.          Psychosocial: Target Goals: Acknowledge presence or absence of depression, maximize coping skills, provide positive support system. Participant is able to verbalize types and ability to use techniques and skills needed for reducing stress and depression.  Initial Review & Psychosocial Screening:     Initial Psych Review & Screening - 10/17/15 1425    Initial Review   Current issues with Current Sleep Concerns  Does not sleep good,  always wakes up early and takes time  to get back to sleep if tries to go back to sleep.   Not a new action.    Family Dynamics   Good Support System? Yes  2 daughters, grand children nearby and help as needed.    One lives with [place] and one in close range.   Barriers   Psychosocial barriers to participate in program There are no identifiable barriers or psychosocial needs.;The patient should benefit from training in stress management and relaxation.   Screening Interventions   Interventions Encouraged to exercise  Quality of Life Scores:   PHQ-9:     Recent Review Flowsheet Data    Depression screen [facility] 2/9 10/17/2015 07/09/2015 06/07/2015   Decreased Interest 0 0 0   Down, Depressed, Hopeless 0 0 0   PHQ - 2 Score 0 0 0   Altered sleeping 0 - -   Tired, decreased energy 0 - -   Change in appetite 0 - -   Feeling bad or failure about yourself  0 - -   Trouble concentrating 0 - -   Moving slowly or fidgety/restless 0 - -   Suicidal thoughts 0 - -   PHQ-9 Score 0 - -   Difficult doing work/chores Not difficult at all - -      Psychosocial Evaluation and Intervention:   Psychosocial Re-Evaluation:   Vocational Rehabilitation: Provide vocational rehab assistance to qualifying candidates.   Vocational Rehab Evaluation & Intervention:     Vocational Rehab - 10/17/15 1432    Initial Vocational Rehab Evaluation & Intervention   Assessment shows need for Vocational Rehabilitation No      Education: Education Goals: Education classes will be provided on a weekly basis, covering required topics. Participant will state understanding/return demonstration of topics presented.  Learning Barriers/Preferences:     Learning Barriers/Preferences - 10/17/15 1431    Learning Barriers/Preferences   Learning Barriers Exercise Concerns   Learning Preferences None      Education Topics: General Nutrition Guidelines/Fats and Fiber: -Group instruction provided by verbal, written material, models and posters to present the general guidelines for heart healthy nutrition. Gives an explanation and review of dietary fats and fiber.   Controlling Sodium/Reading Food Labels: -Group verbal  and written material supporting the discussion of sodium use in heart healthy nutrition. Review and explanation with models, verbal and written materials for utilization of the food label.   Exercise Physiology & Risk Factors: - Group verbal and written instruction with models to review the exercise physiology of the cardiovascular system and associated critical values. Details cardiovascular disease risk factors and the goals associated with each risk factor.   Aerobic Exercise & Resistance Training: - Gives group verbal and written discussion on the health impact of inactivity. On the components of aerobic and resistive training programs and the benefits of this training and how to safely progress through these programs.          Cardiac Rehab from 11/01/2015 in [facility] Cardiac and Pulmonary Rehab   Date  10/23/15   Educator  [facility]   Instruction Review Code  2- meets goals/outcomes      Flexibility, Balance, General Exercise Guidelines: - Provides group verbal and written instruction on the benefits of flexibility and balance training programs. Provides general exercise guidelines with specific guidelines to those with heart or lung disease. Demonstration and skill practice provided.   Stress Management: - Provides group verbal and written instruction about the health risks of elevated stress, cause of high stress, and healthy ways to reduce stress.      Cardiac Rehab from 11/01/2015 in [facility] Cardiac and Pulmonary Rehab   Date  11/01/15   Educator  C. EnterkinRN   Instruction Review Code  2- meets goals/outcomes      Depression: - Provides group verbal and written instruction on the correlation between heart/lung disease and depressed mood, treatment options, and the stigmas associated with seeking treatment.   Anatomy & Physiology of the Heart: - Group verbal and written instruction and models provide basic cardiac anatomy and physiology,  with the coronary electrical and arterial  systems. Review of: AMI, Angina, Valve disease, Heart Failure, Cardiac Arrhythmia, Pacemakers, and the ICD.   Cardiac Procedures: - Group verbal and written instruction and models to describe the testing methods done to diagnose heart disease. Reviews the outcomes of the test results. Describes the treatment choices: Medical Management, Angioplasty, or Coronary Bypass Surgery.   Cardiac Medications: - Group verbal and written instruction to review commonly prescribed medications for heart disease. Reviews the medication, class of the drug, and side effects. Includes the steps to properly store meds and maintain the prescription regimen.   Go Sex-Intimacy & Heart Disease, Get SMART - Goal Setting: - Group verbal and written instruction through game format to discuss heart disease and the return to sexual intimacy. Provides group verbal and written material to discuss and apply goal setting through the application of the S.M.A.R.T. Method.   Other Matters of the Heart: - Provides group verbal, written materials and models to describe Heart Failure, Angina, Valve Disease, and Diabetes in the realm of heart disease. Includes description of the disease process and treatment options available to the cardiac patient.   Exercise & Equipment Safety: - Individual verbal instruction and demonstration of equipment use and safety with use of the equipment.      [facility]   Date  10/17/15   Educator  SB   Instruction Review Code  2- meets goals/outcomes      Infection Prevention: - Provides verbal and written material to individual with discussion of infection control including proper hand washing and proper equipment cleaning during exercise session.      [facility]   Date  10/17/15   Educator  SB   Instruction Review Code  2- meets goals/outcomes      Falls Prevention: - Provides  verbal and written material to individual with discussion of falls prevention and safety.      [facility]   Date  10/17/15   Educator  SB2   Instruction Review Code  2- meets goals/outcomes      Diabetes: - Individual verbal and written instruction to review signs/symptoms of diabetes, desired ranges of glucose level fasting, after meals and with exercise. Advice that pre and post exercise glucose checks will be done for 3 sessions at entry of program.      [facility]   Date  10/17/15   Educator  SB   Instruction Review Code  2- meets goals/outcomes       Knowledge Questionnaire Score:   Core Components/Risk Factors/Patient Goals at Admission:     Personal Goals and Risk Factors at Admission - 10/17/15 1420    Core Components/Risk Factors/Patient Goals on Admission    Weight Management Yes;Obesity   Intervention Weight Management: Develop a combined nutrition and exercise program designed to reach desired caloric intake, while maintaining appropriate intake of nutrient and fiber, sodium and fats, and appropriate energy expenditure required for the weight goal.;Weight Management: Provide education and appropriate resources to help participant work on and attain dietary goals.;Weight Management/Obesity: Establish reasonable short term and long term weight goals.;Obesity: Provide education and appropriate resources to help participant work on and attain dietary goals.  Already workin on weight loss , was 212 lbs last year.    Admit Weight 189 lb (85.73 kg)  Goal Weight: Short Term 185 lb (83.915 kg)   Goal Weight: Long Term 150 lb (68.04 kg)   Expected Outcomes Short Term: Continue to assess and modify interventions until short term weight is achieved;Long Term: Adherence to nutrition and physical activity/exercise program aimed toward attainment of established weight goal;Weight  Loss: Understanding of general recommendations for a balanced deficit meal plan, which promotes 1-2 lb weight loss per week and includes a negative energy balance of [phone] kcal/d   Sedentary Yes   Intervention Provide advice, education, support and counseling about physical activity/exercise needs.;Develop an individualized exercise prescription for aerobic and resistive training based on initial evaluation findings, risk stratification, comorbidities and participant's personal goals.   Expected Outcomes Achievement of increased cardiorespiratory fitness and enhanced flexibility, muscular endurance and strength shown through measurements of functional capacity and personal statement of participant.   Tobacco Cessation Yes   Intervention Assist the participant in steps to quit. Provide individualized education and counseling about committing to Tobacco Cessation, relapse prevention, and pharmacological support that can be provided by physician.   Expected Outcomes Short Term: Will quit all tobacco product use, adhering to prevention of relapse plan.;Long Term: Complete abstinence from all tobacco products for at least 12 months from quit date.;Short Term: Will demonstrate readiness to quit, by selecting a quit date.  IS currently working on weaning off cigareetes.  Down to 2 cigaretts a day.  Wants to be quit by end of month.    Diabetes Yes   Intervention Provide education about signs/symptoms and action to take for hypo/hyperglycemia.;Provide education about proper nutrition, including hydration, and aerobic/resistive exercise prescription along with prescribed medications to achieve blood glucose in normal ranges: Fasting glucose 65-99 mg/dL   Expected Outcomes Short Term: Participant verbalizes understanding of the signs/symptoms and immediate care of hyper/hypoglycemia, proper foot care and importance of medication, aerobic/resistive exercise and nutrition plan for blood glucose control.;Long Term:  Attainment of HbA1C < 7%.   Heart Failure Yes   Intervention Provide a combined exercise and nutrition program that is supplemented with education, support and counseling about heart failure. Directed toward relieving symptoms such as shortness of breath, decreased exercise tolerance, and extremity edema.   Expected Outcomes Improve functional capacity of life;Short term: Attendance in program 2-3 days a week with increased exercise capacity. Reported lower sodium intake. Reported increased fruit and vegetable intake. Reports medication compliance.;Short term: Daily weights obtained and reported for increase. Utilizing diuretic protocols set by physician.;Long term: Adoption of self-care skills and reduction of barriers for early signs and symptoms recognition and intervention leading to self-care maintenance.   Hypertension Yes   Intervention Provide education on lifestyle modifcations including regular physical activity/exercise, weight management, moderate sodium restriction and increased consumption of fresh fruit, vegetables, and low fat dairy, alcohol moderation, and smoking cessation.   Expected Outcomes Short Term: Continued assessment and intervention until BP is < 140/72mm HG in hypertensive participants. < 130/26mm HG in hypertensive participants with diabetes, heart failure or chronic kidney disease.;Long Term: Maintenance of blood pressure at goal levels.   Lipids Yes   Intervention Provide education and support for participant on nutrition & aerobic/resistive exercise along with prescribed medications to achieve LDL 70mg , HDL >40mg .   Expected Outcomes Short Term: Participant states understanding of desired cholesterol values and is compliant with medications prescribed. Participant is following exercise prescription and nutrition guidelines.;Long Term: Cholesterol controlled with medications as prescribed, with individualized exercise RX and with personalized nutrition plan. Value goals: LDL  < 70mg , HDL > 40  mg.      Core Components/Risk Factors/Patient Goals Review:    Core Components/Risk Factors/Patient Goals at Discharge (Final Review):    ITP Comments:     ITP Comments      10/17/15 1434 10/17/15 1641 10/24/15 0850 10/30/15 1051 11/01/15 1121   ITP Comments Mediccal review completed today. Initial ITP done.  Continue with ITP Mediccal review completed today. Initial ITP done.  Continue with ITP 30 day review.  Continue with ITP   New to program Patient called department to let staff know she missed class because she has not been feeling well and having difficulty sleeping at night.  She reported she did not get to sleep until 3 a.m. this morning.  Therefore, she was unable to attend class this morning.  Hopes to return soon.   On insulin is counting carbs as the [employer] suggested to [place] . Chiquitta  said she doesn't need to meet individually with the Cardiac Rehab Registered Dietician.      11/21/15 0908           ITP Comments 30 day review. Continue with ITP last visit 6/29          Comments:

## 2015-11-29 ENCOUNTER — Encounter: Payer: Self-pay | Admitting: *Deleted

## 2015-11-29 DIAGNOSIS — I5022 Chronic systolic (congestive) heart failure: Secondary | ICD-10-CM

## 2015-11-29 NOTE — Progress Notes (Signed)
Alexis Ray has been out since last review.  She strained her breast and has now sprained her ankle.  She had xrays done today.  She hopes to be able to return next week.

## 2015-12-04 ENCOUNTER — Encounter: Payer: Medicare HMO | Attending: Internal Medicine

## 2015-12-04 DIAGNOSIS — I5022 Chronic systolic (congestive) heart failure: Secondary | ICD-10-CM | POA: Insufficient documentation

## 2015-12-04 DIAGNOSIS — Z9889 Other specified postprocedural states: Secondary | ICD-10-CM | POA: Insufficient documentation

## 2015-12-04 DIAGNOSIS — I10 Essential (primary) hypertension: Secondary | ICD-10-CM | POA: Insufficient documentation

## 2015-12-04 DIAGNOSIS — J449 Chronic obstructive pulmonary disease, unspecified: Secondary | ICD-10-CM | POA: Insufficient documentation

## 2015-12-04 DIAGNOSIS — I2510 Atherosclerotic heart disease of native coronary artery without angina pectoris: Secondary | ICD-10-CM | POA: Insufficient documentation

## 2015-12-12 ENCOUNTER — Telehealth: Payer: Self-pay | Admitting: *Deleted

## 2015-12-12 NOTE — Telephone Encounter (Signed)
Called to check on status to return to program. Missing CR because of MD appointments. HAs many appointments to get through.  Can try to make up dates.

## 2015-12-13 ENCOUNTER — Encounter: Payer: Medicare HMO | Admitting: *Deleted

## 2015-12-13 DIAGNOSIS — J449 Chronic obstructive pulmonary disease, unspecified: Secondary | ICD-10-CM | POA: Diagnosis not present

## 2015-12-13 DIAGNOSIS — I5022 Chronic systolic (congestive) heart failure: Secondary | ICD-10-CM

## 2015-12-13 DIAGNOSIS — I2510 Atherosclerotic heart disease of native coronary artery without angina pectoris: Secondary | ICD-10-CM | POA: Diagnosis not present

## 2015-12-13 DIAGNOSIS — Z9889 Other specified postprocedural states: Secondary | ICD-10-CM | POA: Diagnosis not present

## 2015-12-13 DIAGNOSIS — I10 Essential (primary) hypertension: Secondary | ICD-10-CM | POA: Diagnosis not present

## 2015-12-13 LAB — GLUCOSE, CAPILLARY
GLUCOSE-CAPILLARY: 52 mg/dL — AB (ref 65–99)
Glucose-Capillary: 134 mg/dL — ABNORMAL HIGH (ref 65–99)

## 2015-12-13 NOTE — Progress Notes (Signed)
Daily Session Note  Patient Details  Name: Alexis Ray MRN: [id number] Date of Birth: 10/01/1955 Referring Provider:    Encounter Date: 12/13/2015  Check In:     Session Check In - 12/13/15 0835      Check-In   Location [facility]   Staff Present Alberteen Sam, MA, ACSM RCEP, Exercise Physiologist;Amanda Oletta Darter, BA, ACSM CEP, Exercise Physiologist;Carroll Enterkin, RN, BSN   Supervising physician immediately available to respond to emergencies See telemetry face sheet for immediately available ER MD   Medication changes reported     No   Fall or balance concerns reported    No   Warm-up and Cool-down Performed on first and last piece of equipment   Resistance Training Performed Yes   VAD Patient? No     Pain Assessment   Currently in Pain? No/denies   Multiple Pain Sites No         Goals Met:  Exercise tolerated well No report of cardiac concerns or symptoms Strength training completed today  Goals Unmet:  Not Applicable  Comments: [name] returned today from being out since 6/29.  She was able to return to her current workloads without a problem. After exercise [name]'s blood sugar was 54. She was given 4 glucose tab. Recheck was 134! [name] was encouraged to eat protein for breakfast and to check her blood sugar prior to exercise.After exercise [name]'s blood sugar was 54. She was given 4 glucose tab. Recheck was 134! [name] was encouraged to eat protein for breakfast and to check her blood sugar prior to exercise.  We had a hard time getting [name]'s O2 Sat today during exercise.  When it did show up, it was in the mid 80s but she did not feel any shortness of breath.  We did titrate her oxygen up to 4L.  At cool down, she was 92%.  Dr. Emily Filbert is Medical Director for [facility].

## 2015-12-18 ENCOUNTER — Encounter: Payer: Medicare HMO | Admitting: *Deleted

## 2015-12-18 DIAGNOSIS — I5022 Chronic systolic (congestive) heart failure: Secondary | ICD-10-CM | POA: Diagnosis not present

## 2015-12-18 NOTE — Progress Notes (Signed)
Daily Session Note  Patient Details  Name: Alexis Ray MRN: [id number] Date of Birth: January 08, 1956 Referring Provider:    Encounter Date: 12/18/2015  Check In:     Session Check In - 12/18/15 0833      Check-In   Location [facility]   Staff Present Gerlene Burdock, RN, Levie Heritage, MA, ACSM RCEP, Exercise Physiologist;Diane Joya Gaskins, RN, BSN   Supervising physician immediately available to respond to emergencies See telemetry face sheet for immediately available ER MD   Medication changes reported     No   Fall or balance concerns reported    No   Warm-up and Cool-down Performed on first and last piece of equipment   Resistance Training Performed Yes   VAD Patient? No     Pain Assessment   Currently in Pain? No/denies         Goals Met:  Proper associated with RPD/PD & O2 Sat Exercise tolerated well  Goals Unmet:  Not Applicable  Comments:     Dr. Emily Filbert is Medical Director for [place] [facility].

## 2015-12-19 ENCOUNTER — Encounter: Payer: Self-pay | Admitting: *Deleted

## 2015-12-19 DIAGNOSIS — I5022 Chronic systolic (congestive) heart failure: Secondary | ICD-10-CM

## 2015-12-19 NOTE — Progress Notes (Signed)
Cardiac Individual Treatment Plan  Patient Details  Name: Alexis Ray MRN: [id number] Date of Birth: [birth year] Referring Provider:    Initial Encounter Date:  Flowsheet Row Cardiac Rehab from 10/17/2015 in [facility] Cardiac and Pulmonary Rehab  Date  10/17/15      Visit Diagnosis: Heart failure, chronic systolic (HCC)  Patient's Home Medications on Admission:  Current Outpatient Prescriptions:  .  albuterol (PROVENTIL) (2.5 MG/3ML) 0.083% nebulizer solution, Take 3 mLs (2.5 mg total) by nebulization every 6 (six) hours as needed for wheezing or shortness of breath., Disp: 75 mL, Rfl: 2 .  albuterol (VENTOLIN HFA) 108 (90 Base) MCG/ACT inhaler, Inhale into the lungs every 6 (six) hours as needed for wheezing or shortness of breath., Disp: , Rfl:  .  aspirin EC 81 MG tablet, Take 81 mg by mouth daily., Disp: , Rfl:  .  budesonide-formoterol (SYMBICORT) 160-4.5 MCG/ACT inhaler, Inhale 2 puffs into the lungs 2 (two) times daily., Disp: , Rfl:  .  carvedilol (COREG) 25 MG tablet, Take 12.5 mg by mouth 2 (two) times daily with a meal., Disp: , Rfl:  .  cilostazol (PLETAL) 100 MG tablet, Take 100 mg by mouth 2 (two) times daily., Disp: , Rfl:  .  diphenhydrAMINE (BENADRYL) 25 mg capsule, Take 25 mg by mouth every 6 (six) hours as needed., Disp: , Rfl:  .  docusate sodium (COLACE) 100 MG capsule, Take 100 mg by mouth daily as needed for mild constipation., Disp: , Rfl:  .  esomeprazole (NEXIUM) 40 MG capsule, Take 40 mg by mouth daily at 12 noon., Disp: , Rfl:  .  furosemide (LASIX) 40 MG tablet, Take 1 tablet (40 mg total) by mouth 2 (two) times daily., Disp: 60 tablet, Rfl: 5 .  insulin aspart (NOVOLOG) 100 UNIT/ML injection, Inject 10 Units into the skin 3 (three) times daily with meals., Disp: , Rfl:  .  insulin glargine (LANTUS) 100 UNIT/ML injection, Inject 40 Units into the skin at bedtime., Disp: , Rfl:  .  levocetirizine (XYZAL) 5 MG tablet, Take 5 mg by mouth at bedtime. , Disp: , Rfl:   .  metFORMIN (GLUCOPHAGE) 1000 MG tablet, Take 1,000 mg by mouth 2 (two) times daily with a meal., Disp: , Rfl:  .  Multiple Vitamins-Minerals (Alexis Ray) TABS, Take 1 tablet by mouth daily., Disp: , Rfl:  .  nitroGLYCERIN (NITROSTAT) 0.4 MG SL tablet, Place 0.4 mg under the tongue every 5 (five) minutes as needed for chest pain., Disp: , Rfl:  .  pregabalin (LYRICA) 150 MG capsule, Take 150 mg by mouth 3 (three) times daily. , Disp: , Rfl:  .  rosuvastatin (CRESTOR) 40 MG tablet, Take 40 mg by mouth daily., Disp: , Rfl:  .  sacubitril-valsartan (ENTRESTO) 24-26 MG, Take 1 tablet by mouth 2 (two) times daily., Disp: 180 tablet, Rfl: 3 .  sacubitril-valsartan (ENTRESTO) 24-26 MG, Take 1 tablet by mouth 2 (two) times daily., Disp: 60 tablet, Rfl: 0 .  spironolactone (ALDACTONE) 50 MG tablet, Take 50 mg by mouth daily., Disp: , Rfl:  .  tiotropium (SPIRIVA) 18 MCG inhalation capsule, Place 18 mcg into inhaler and inhale daily., Disp: , Rfl:  .  Venlafaxine HCl 225 MG TB24, Take 225 mg by mouth daily. , Disp: , Rfl:   Past Medical History: Past Medical History:  Diagnosis Date  . CAD (coronary artery disease)   . Chronic combined systolic and diastolic CHF (congestive heart failure) (HCC)   . COPD (chronic obstructive pulmonary  disease) (HCC)   . Diabetes mellitus without complication (HCC)   . GERD (gastroesophageal reflux disease)   . Hypertension   . PVD (peripheral vascular disease) (HCC)   . Stroke (cerebrum) (HCC)     Tobacco Use: History  Smoking Status  . Current Every Day Smoker  Smokeless Tobacco  . Never Used    Labs: Recent Review Flowsheet Data    Labs for ITP Cardiac and Pulmonary Rehab Latest Ref Rng & Units 08/25/2011 01/22/2015 05/24/2015   Hemoglobin A1c 4.0 - 6.0 % 10.7(H) - 6.8(H)   PHART 7.350 - 7.450 - 7.43 -   PCO2ART 32.0 - 48.0 mmHg - 30(L) -   HCO3 21.0 - 28.0 mEq/L - 19.9(L) -   ACIDBASEDEF 0.0 - 2.0 mmol/L - 3.3(H) -   O2SAT % - 96.1 -        Exercise Target Goals:    Exercise Program Goal: Individual exercise prescription set with THRR, safety & activity barriers. Participant demonstrates ability to understand and report RPE using BORG scale, to self-measure pulse accurately, and to acknowledge the importance of the exercise prescription.  Exercise Prescription Goal: Starting with aerobic activity 30 plus minutes a day, 3 days per week for initial exercise prescription. Provide home exercise prescription and guidelines that participant acknowledges understanding prior to discharge.  Activity Barriers & Risk Stratification:     Activity Barriers & Cardiac Risk Stratification - 10/17/15 1432      Activity Barriers & Cardiac Risk Stratification   Activity Barriers Shortness of Breath;Neck/Spine Problems;Assistive Device  Has spurs in her back.  Concerned about her back pain increasing with more exercise. Does have a walker to help her manage to stop and rest when needed if she is out walking.    Cardiac Risk Stratification High      6 Minute Walk:     6 Minute Walk    Row Name 10/17/15 1535         6 Minute Walk   Distance 400 feet     Walk Time 2.9 minutes     # of Rest Breaks 1     MPH 1.7     METS 1.75     RPE 13     Perceived Dyspnea  4     Symptoms Yes (comment)     Comments Bcak pain due to bone spurs is the reason Alexis Ray had to stop     Resting HR 74 bpm     Resting BP 132/82     Max Ex. HR 103 bpm     Max Ex. BP 126/60        Initial Exercise Prescription:     Initial Exercise Prescription - 10/17/15 1500      Date of Initial Exercise RX and Referring Provider   Date 10/17/15     Oxygen   Oxygen Continuous   Liters 3     NuStep   Level 1   Watts 20   Minutes 15     Biostep-RELP   Level 1   Watts 20   Minutes 15     Prescription Details   Frequency (times per week) 2   Duration Progress to 30 minutes of continuous aerobic without signs/symptoms of physical distress      Intensity   THRR 40-80% of Max Heartrate 108-143   Ratings of Perceived Exertion 11-13   Perceived Dyspnea 0-4     Progression   Progression Continue to progress workloads to maintain intensity without signs/symptoms of  physical distress.     Resistance Training   Training Prescription Yes   Weight 1   Reps 10-15      Perform Capillary Blood Glucose checks as needed.  Exercise Prescription Changes:     Exercise Prescription Changes    Row Name 10/17/15 1500 10/31/15 1300 11/13/15 1300 12/13/15 1300       Exercise Review   Progression  -  - Yes No  Returned after being out for over one month      Response to Exercise   Blood Pressure (Admit) 132/82 128/66 118/60 130/74    Blood Pressure (Exercise) 126/60 128/68 128/58 126/64    Blood Pressure (Exit) 118/70 112/64 102/58 126/72    Heart Rate (Admit) 74 bpm 75 bpm 88 bpm 83 bpm    Heart Rate (Exercise) 96 bpm 90 bpm 78 bpm 94 bpm    Heart Rate (Exit) 73 bpm 71 bpm 76 bpm 82 bpm    Oxygen Saturation (Exercise)  -  - 99 % 93 %    Rating of Perceived Exertion (Exercise) 13 13 13 13     Perceived Dyspnea (Exercise) 4  -  - 0    Duration  -  - Progress to 45 minutes of aerobic exercise without signs/symptoms of physical distress Progress to 45 minutes of aerobic exercise without signs/symptoms of physical distress    Intensity  -  - THRR unchanged THRR unchanged      Progression   Progression  -  - Continue to progress workloads to maintain intensity without signs/symptoms of physical distress. Continue to progress workloads to maintain intensity without signs/symptoms of physical distress.    Average METs  -  - 1.7 1.8      Resistance Training   Training Prescription  -  - Yes Yes    Weight  -  - 1 2 lbs    Reps  -  - 10-15 10-15      Interval Training   Interval Training  -  - No No      Oxygen   Oxygen  -  - Continuous Continuous    Liters  -  - 3 2-4      Treadmill   MPH  - 1  -  -    Grade  - 0  -  -    Minutes   - 5  -  -      NuStep   Level  - 2 2 2     Watts  - 11 -  1.7 METs -  1.9 METs    Minutes  - 15 22 15       Arm Ergometer   Level  -  -  - 1    Minutes  -  -  - 15    METs  -  -  - 1.7       Exercise Comments:     Exercise Comments    Row Name 10/23/15 0840 11/13/15 1354 11/29/15 1105 12/13/15 0836 12/13/15 0943   Exercise Comments First full day of exercise!  Patient was oriented to gym and equipment including functions, settings, policies, and procedures.  Patient's individual exercise prescription and treatment plan were reviewed.  All starting workloads were established based on the results of the 6 minute walk test done at initial orientation visit.  The plan for exercise progression was also introduced and progression will be customized based on patient's performance and goals. Pt has only attended once since last review and  stayed on NuStep for the entire time that day.  She is off to a good start and we will continue to monitor for progression. Alexis Ray has been out since last review.  She strained her breast and has now sprained her ankle.  She had xrays done today.  She hopes to be able to return next week.  We may need to relook at her equipment depending on how her ankle is feeling upon return.  Alexis Ray returned today from being out since 6/29.  She was able to return to her current workloads without a problem.  After exercise Alexis Ray's blood sugar was 54. She was given 4 glucose tab.  Recheck was 134!  Alexis Ray was encouraged to eat protein for breakfast and to check her blood sugar prior to exercise. We had a hard time getting Alexis Ray's O2 Sat today during exercise.  When it did show up, it was in the mid 80s but she did not feel any shortness of breath.  We did titrate her oxygen up to 4L.  At cool down, she was 92%.   Row Name 12/18/15 1023           Exercise Comments Reviewed METs average and discussed progression with pt today.          Discharge Exercise Prescription (Final  Exercise Prescription Changes):     Exercise Prescription Changes - 12/13/15 1300      Exercise Review   Progression No  Returned after being out for over one month     Response to Exercise   Blood Pressure (Admit) 130/74   Blood Pressure (Exercise) 126/64   Blood Pressure (Exit) 126/72   Heart Rate (Admit) 83 bpm   Heart Rate (Exercise) 94 bpm   Heart Rate (Exit) 82 bpm   Oxygen Saturation (Exercise) 93 %   Rating of Perceived Exertion (Exercise) 13   Perceived Dyspnea (Exercise) 0   Duration Progress to 45 minutes of aerobic exercise without signs/symptoms of physical distress   Intensity THRR unchanged     Progression   Progression Continue to progress workloads to maintain intensity without signs/symptoms of physical distress.   Average METs 1.8     Resistance Training   Training Prescription Yes   Weight 2 lbs   Reps 10-15     Interval Training   Interval Training No     Oxygen   Oxygen Continuous   Liters 2-4     NuStep   Level 2   Watts --  1.9 METs   Minutes 15     Arm Ergometer   Level 1   Minutes 15   METs 1.7      Nutrition:  Target Goals: Understanding of nutrition guidelines, daily intake of sodium 1500mg , cholesterol 200mg , calories 30% from fat and 7% or less from saturated fats, daily to have 5 or more servings of fruits and vegetables.  Biometrics:     Pre Biometrics - 10/17/15 1532      Pre Biometrics   Height 5' 5.5" (1.664 m)   Weight 189 lb (85.7 kg)   Waist Circumference 47 inches   Hip Circumference 42.5 inches   Waist to Hip Ratio 1.11 %   BMI (Calculated) 31   Single Leg Stand 3 seconds       Nutrition Therapy Plan and Nutrition Goals:     Nutrition Therapy & Goals - 11/01/15 1119      Nutrition Therapy   Diet --  On insulin is counting carbs as the  [facility] Dietician suggested to [employer]..    Drug/Food Interactions Statins/Certain Fruits  On insulin is counting carbs as the [employer] suggested to [place].       Personal Nutrition Goals   Personal Goal #1 Cont to count carbs as the [facility] [employer] dietician instructed her to.      Intervention Plan   Intervention Prescribe, educate and counsel regarding individualized specific dietary modifications aiming towards targeted core components such as weight, hypertension, lipid management, diabetes, heart failure and other comorbidities.   Expected Outcomes Short Term Goal: Understand basic principles of dietary content, such as calories, fat, sodium, cholesterol and nutrients.;Long Term Goal: Adherence to prescribed nutrition plan.      Nutrition Discharge: Rate Your Plate Scores:   Nutrition Goals Re-Evaluation:     Nutrition Goals Re-Evaluation    Row Name 11/01/15 1122 12/18/15 0937           Personal Goal #1 Re-Evaluation   Personal Goal #1  - Still counting carbs and taking her insulin      Goal Progress Seen Yes  -      Comments On insulin is counting carbs as the [employer] suggested to Alexis Ray . Alexis Ray  said she doesn't need to meet individually with the Cardiac Rehab Registered Dietician.  -         Psychosocial: Target Goals: Acknowledge presence or absence of depression, maximize coping skills, provide positive support system. Participant is able to verbalize types and ability to use techniques and skills needed for reducing stress and depression.  Initial Review & Psychosocial Screening:     Initial Psych Review & Screening - 10/17/15 1425      Initial Review   Current issues with Current Sleep Concerns  Does not sleep good,  always wakes up early and takes time  to get back to sleep if tries to go back to sleep.   Not a new action.      Family Dynamics   Good Support System? Yes  2 daughters, grand children nearby and help as needed.   One lives with Alexis Ray and one in close range.     Barriers   Psychosocial barriers to participate in program There are no identifiable barriers or psychosocial needs.;The patient should benefit from  training in stress management and relaxation.     Screening Interventions   Interventions Encouraged to exercise      Quality of Life Scores:   PHQ-9: Recent Review Flowsheet Data    Depression screen [facility] 2/9 10/17/2015 07/09/2015 06/07/2015   Decreased Interest 0 0 0   Down, Depressed, Hopeless 0 0 0   PHQ - 2 Score 0 0 0   Altered sleeping 0 - -   Tired, decreased energy 0 - -   Change in appetite 0 - -   Feeling bad or failure about yourself  0 - -   Trouble concentrating 0 - -   Moving slowly or fidgety/restless 0 - -   Suicidal thoughts 0 - -   PHQ-9 Score 0 - -   Difficult doing work/chores Not difficult at all - -      Psychosocial Evaluation and Intervention:   Psychosocial Re-Evaluation:     Psychosocial Re-Evaluation    Row Name 12/18/15 [phone]             Psychosocial Re-Evaluation   Interventions Encouraged to attend Cardiac Rehabilitation for the exercise       Comments Alexis Ray said she feels Cardiac Rehab is goin well  for her.           Vocational Rehabilitation: Provide vocational rehab assistance to qualifying candidates.   Vocational Rehab Evaluation & Intervention:     Vocational Rehab - 10/17/15 1432      Initial Vocational Rehab Evaluation & Intervention   Assessment shows need for Vocational Rehabilitation No      Education: Education Goals: Education classes will be provided on a weekly basis, covering required topics. Participant will state understanding/return demonstration of topics presented.  Learning Barriers/Preferences:     Learning Barriers/Preferences - 10/17/15 1431      Learning Barriers/Preferences   Learning Barriers Exercise Concerns   Learning Preferences None      Education Topics: General Nutrition Guidelines/Fats and Fiber: -Group instruction provided by verbal, written material, models and posters to present the general guidelines for heart healthy nutrition. Gives an explanation and review of dietary fats  and fiber.   Controlling Sodium/Reading Food Labels: -Group verbal and written material supporting the discussion of sodium use in heart healthy nutrition. Review and explanation with models, verbal and written materials for utilization of the food label.   Exercise Physiology & Risk Factors: - Group verbal and written instruction with models to review the exercise physiology of the cardiovascular system and associated critical values. Details cardiovascular disease risk factors and the goals associated with each risk factor.   Aerobic Exercise & Resistance Training: - Gives group verbal and written discussion on the health impact of inactivity. On the components of aerobic and resistive training programs and the benefits of this training and how to safely progress through these programs. Flowsheet Row Cardiac Rehab from 12/18/2015 in [facility] Cardiac and Pulmonary Rehab  Date  12/13/15  Educator  AS  Instruction Review Code  R- Review/reinforce [Second Class]      Flexibility, Balance, General Exercise Guidelines: - Provides group verbal and written instruction on the benefits of flexibility and balance training programs. Provides general exercise guidelines with specific guidelines to those with heart or lung disease. Demonstration and skill practice provided. Flowsheet Row Cardiac Rehab from 12/18/2015 in [facility] Cardiac and Pulmonary Rehab  Date  12/18/15  Educator  Alexis Ray  Instruction Review Code  2- meets goals/outcomes      Stress Management: - Provides group verbal and written instruction about the health risks of elevated stress, cause of high stress, and healthy ways to reduce stress. Flowsheet Row Cardiac Rehab from 12/18/2015 in [facility] Cardiac and Pulmonary Rehab  Date  11/01/15  Educator  C. EnterkinRN  Instruction Review Code  2- meets goals/outcomes      Depression: - Provides group verbal and written instruction on the correlation between heart/lung disease and  depressed mood, treatment options, and the stigmas associated with seeking treatment.   Anatomy & Physiology of the Heart: - Group verbal and written instruction and models provide basic cardiac anatomy and physiology, with the coronary electrical and arterial systems. Review of: AMI, Angina, Valve disease, Heart Failure, Cardiac Arrhythmia, Pacemakers, and the ICD.   Cardiac Procedures: - Group verbal and written instruction and models to describe the testing methods done to diagnose heart disease. Reviews the outcomes of the test results. Describes the treatment choices: Medical Management, Angioplasty, or Coronary Bypass Surgery.   Cardiac Medications: - Group verbal and written instruction to review commonly prescribed medications for heart disease. Reviews the medication, class of the drug, and side effects. Includes the steps to properly store meds and maintain the prescription regimen.   Go Sex-Intimacy &  Heart Disease, Get SMART - Goal Setting: - Group verbal and written instruction through game format to discuss heart disease and the return to sexual intimacy. Provides group verbal and written material to discuss and apply goal setting through the application of the S.M.A.R.T. Method.   Other Matters of the Heart: - Provides group verbal, written materials and models to describe Heart Failure, Angina, Valve Disease, and Diabetes in the realm of heart disease. Includes description of the disease process and treatment options available to the cardiac patient.   Exercise & Equipment Safety: - Individual verbal instruction and demonstration of equipment use and safety with use of the equipment. Flow[facility]  Date  10/17/15  Educator  SB  Instruction Review Code  2- meets goals/outcomes      Infection Prevention: - Provides verbal and written material to individual with discussion of infection control including  proper hand washing and proper equipment cleaning during exercise session. Flowsheet Row [facility]  Date  10/17/15  Educator  SB  Instruction Review Code  2- meets goals/outcomes      Falls Prevention: - Provides verbal and written material to individual with discussion of falls prevention and safety. Flowsheet Row Cardiac Rehab from 12/18/2015 in [facility] Cardiac and Pulmonary Rehab  Date  10/17/15  Educator  SB2  Instruction Review Code  2- meets goals/outcomes      Diabetes: - Individual verbal and written instruction to review signs/symptoms of diabetes, desired ranges of glucose level fasting, after meals and with exercise. Advice that pre and post exercise glucose checks will be done for 3 sessions at entry of program. Flowsheet Row [facility]  Date  10/17/15  Educator  SB  Instruction Review Code  2- meets goals/outcomes       Knowledge Questionnaire Score:   Core Components/Risk Factors/Patient Goals at Admission:     Personal Goals and Risk Factors at Admission - 10/17/15 1420      Core Components/Risk Factors/Patient Goals on Admission    Weight Management Yes;Obesity   Intervention Weight Management: Develop a combined nutrition and exercise program designed to reach desired caloric intake, while maintaining appropriate intake of nutrient and fiber, sodium and fats, and appropriate energy expenditure required for the weight goal.;Weight Management: Provide education and appropriate resources to help participant work on and attain dietary goals.;Weight Management/Obesity: Establish reasonable short term and long term weight goals.;Obesity: Provide education and appropriate resources to help participant work on and attain dietary goals.  Already workin on weight loss , was 212 lbs last year.    Admit Weight 189 lb (85.7 kg)   Goal Weight: Short Term 185 lb (83.9 kg)    Goal Weight: Long Term 150 lb (68 kg)   Expected Outcomes Short Term: Continue to assess and modify interventions until short term weight is achieved;Long Term: Adherence to nutrition and physical activity/exercise program aimed toward attainment of established weight goal;Weight Loss: Understanding of general recommendations for a balanced deficit meal plan, which promotes 1-2 lb weight loss per week and includes a negative energy balance of [phone] kcal/d   Sedentary Yes   Intervention Provide advice, education, support and counseling about physical activity/exercise needs.;Develop an individualized exercise prescription for aerobic and resistive training based on initial evaluation findings, risk stratification, comorbidities and participant's personal goals.   Expected Outcomes Achievement of increased cardiorespiratory fitness and enhanced  flexibility, muscular endurance and strength shown through measurements of functional capacity and personal statement of participant.   Tobacco Cessation Yes   Intervention Assist the participant in steps to quit. Provide individualized education and counseling about committing to Tobacco Cessation, relapse prevention, and pharmacological support that can be provided by physician.   Expected Outcomes Short Term: Will quit all tobacco product use, adhering to prevention of relapse plan.;Long Term: Complete abstinence from all tobacco products for at least 12 months from quit date.;Short Term: Will demonstrate readiness to quit, by selecting a quit date.  IS currently working on weaning off cigareetes.  Down to 2 cigaretts a day.  Wants to be quit by end of month.    Diabetes Yes   Intervention Provide education about signs/symptoms and action to take for hypo/hyperglycemia.;Provide education about proper nutrition, including hydration, and aerobic/resistive exercise prescription along with prescribed medications to achieve blood glucose in normal ranges: Fasting  glucose 65-99 mg/dL   Expected Outcomes Short Term: Participant verbalizes understanding of the signs/symptoms and immediate care of hyper/hypoglycemia, proper foot care and importance of medication, aerobic/resistive exercise and nutrition plan for blood glucose control.;Long Term: Attainment of HbA1C < 7%.   Heart Failure Yes   Intervention Provide a combined exercise and nutrition program that is supplemented with education, support and counseling about heart failure. Directed toward relieving symptoms such as shortness of breath, decreased exercise tolerance, and extremity edema.   Expected Outcomes Improve functional capacity of life;Short term: Attendance in program 2-3 days a week with increased exercise capacity. Reported lower sodium intake. Reported increased fruit and vegetable intake. Reports medication compliance.;Short term: Daily weights obtained and reported for increase. Utilizing diuretic protocols set by physician.;Long term: Adoption of self-care skills and reduction of barriers for early signs and symptoms recognition and intervention leading to self-care maintenance.   Hypertension Yes   Intervention Provide education on lifestyle modifcations including regular physical activity/exercise, weight management, moderate sodium restriction and increased consumption of fresh fruit, vegetables, and low fat dairy, alcohol moderation, and smoking cessation.   Expected Outcomes Short Term: Continued assessment and intervention until BP is < 140/7090mm HG in hypertensive participants. < 130/8080mm HG in hypertensive participants with diabetes, heart failure or chronic kidney disease.;Long Term: Maintenance of blood pressure at goal levels.   Lipids Yes   Intervention Provide education and support for participant on nutrition & aerobic/resistive exercise along with prescribed medications to achieve LDL 70mg , HDL >40mg .   Expected Outcomes Short Term: Participant states understanding of desired  cholesterol values and is compliant with medications prescribed. Participant is following exercise prescription and nutrition guidelines.;Long Term: Cholesterol controlled with medications as prescribed, with individualized exercise RX and with personalized nutrition plan. Value goals: LDL < 70mg , HDL > 40 mg.      Core Components/Risk Factors/Patient Goals Review:      Goals and Risk Factor Review    Row Name 12/13/15 0945 12/18/15 0930           Core Components/Risk Factors/Patient Goals Review   Personal Goals Review Weight Management/Obesity;Tobacco Cessation;Sedentary;Diabetes;Heart Failure;Hypertension;Lipids  -      Review Alexis Ray returned to day after being out for over a month.  She has maintained her weight and checks it on a daily basis (184-187).  She has not had any heart failure symptoms and watches her fluid and salt intake.  She is wearing her oxygen at home, but not out and about. She has an appt with Pulmonology tomorrow to discuss getting a smaller portable  concentrator.  Her blood pressures and sugars have been stable.  No issues with her statins.  She is only walking around the house doing ADLs right now.  She is down to 3-4 cigarettes a day. Alexis Ray is still about the same. Pulse ox room air at home 93%. Alexis Ray said she is smoking 3 cigareets a day. She said she doesn't go to the [facility] now until the 22nd.       Expected Outcomes Alexis Ray will continue to come to exercise and education classes.  We will continue to monitor her for tobacco, HTN, DM, and HF.  -         Core Components/Risk Factors/Patient Goals at Discharge (Final Review):      Goals and Risk Factor Review - 12/18/15 0930      Core Components/Risk Factors/Patient Goals Review   Review Alexis Ray is still about the same. Pulse ox room air at home 93%. Alexis Ray said she is smoking 3 cigareets a day. She said she doesn't go to the [facility] now until the 22nd.       ITP Comments:     ITP Comments    Row Name  10/17/15 1434 10/17/15 1641 10/24/15 0850 10/30/15 1051 11/01/15 1121   ITP Comments Mediccal review completed today. Initial ITP done.  Continue with ITP Mediccal review completed today. Initial ITP done.  Continue with ITP 30 day review.  Continue with ITP   New to program Patient called department to let staff know she missed class because she has not been feeling well and having difficulty sleeping at night.  She reported she did not get to sleep until 3 a.m. this morning.  Therefore, she was unable to attend class this morning.  Hopes to return soon.   On insulin is counting carbs as the [employer] suggested to [place] . Kenlynn  said she doesn't need to meet individually with the Cardiac Rehab Registered Dietician.    Row Name 11/21/15 0908 11/29/15 1103 12/18/15 0927 12/19/15 0709     ITP Comments 30 day review. Continue with ITP last visit 6/29 Adalaya has been out since last review.  She strained her breast and has now sprained her ankle.  She had xrays done today.  She hopes to be able to return next week. Ajanae is here exercising today. She said her right foot was not sprained "but they thought it might be". "They are going to send me to a foot doctor since the top of my foot hurts. We did not let her get on the treadmill today since she is wearing slippers since the top of her right foot hurts.  30 day review. Continue with ITP unless changes noted by Medical Director at signature of review.       Comments:

## 2016-01-08 ENCOUNTER — Encounter: Payer: Self-pay | Admitting: *Deleted

## 2016-01-08 ENCOUNTER — Encounter: Payer: Medicare HMO | Attending: Internal Medicine

## 2016-01-08 DIAGNOSIS — I5022 Chronic systolic (congestive) heart failure: Secondary | ICD-10-CM

## 2016-01-08 DIAGNOSIS — J449 Chronic obstructive pulmonary disease, unspecified: Secondary | ICD-10-CM | POA: Insufficient documentation

## 2016-01-08 DIAGNOSIS — I2510 Atherosclerotic heart disease of native coronary artery without angina pectoris: Secondary | ICD-10-CM | POA: Insufficient documentation

## 2016-01-08 DIAGNOSIS — Z9889 Other specified postprocedural states: Secondary | ICD-10-CM | POA: Insufficient documentation

## 2016-01-08 DIAGNOSIS — I10 Essential (primary) hypertension: Secondary | ICD-10-CM | POA: Insufficient documentation

## 2016-01-08 NOTE — Progress Notes (Signed)
Discharge Summary  Patient Details  Name: Alexis Ray MRN: [id number] Date of Birth: 02/03/1956 Referring Provider:     Number of Visits: 10  Reason for Discharge:  Early Exit:  Personal and lack of transportation  Smoking History:  History  Smoking Status  . Current Every Day Smoker  Smokeless Tobacco  . Never Used    Diagnosis:  Heart failure, chronic systolic (HCC)  ADL UCSD:   Initial Exercise Prescription:     Initial Exercise Prescription - 10/17/15 1500      Date of Initial Exercise RX and Referring Provider   Date 10/17/15     Oxygen   Oxygen Continuous   Liters 3     NuStep   Level 1   Watts 20   Minutes 15     Biostep-RELP   Level 1   Watts 20   Minutes 15     Prescription Details   Frequency (times per week) 2   Duration Progress to 30 minutes of continuous aerobic without signs/symptoms of physical distress     Intensity   THRR 40-80% of Max Heartrate 108-143   Ratings of Perceived Exertion 11-13   Perceived Dyspnea 0-4     Progression   Progression Continue to progress workloads to maintain intensity without signs/symptoms of physical distress.     Resistance Training   Training Prescription Yes   Weight 1   Reps 10-15      Discharge Exercise Prescription (Final Exercise Prescription Changes):     Exercise Prescription Changes - 12/25/15 1300      Exercise Review   Progression Yes     Response to Exercise   Blood Pressure (Admit) 124/70   Blood Pressure (Exercise) 122/70   Blood Pressure (Exit) 100/60   Heart Rate (Admit) 91 bpm   Heart Rate (Exercise) 101 bpm   Heart Rate (Exit) 80 bpm   Rating of Perceived Exertion (Exercise) 13   Symptoms none   Duration Progress to 45 minutes of aerobic exercise without signs/symptoms of physical distress   Intensity THRR unchanged     Progression   Progression Continue to progress workloads to maintain intensity without signs/symptoms of physical distress.   Average METs 1.9      Resistance Training   Training Prescription Yes   Weight 2 lbs   Reps 10-15     Interval Training   Interval Training No     NuStep   Level 2   Watts --  1.9 METs   Minutes 15     Arm Ergometer   Level 1   Minutes 15   METs 2      Functional Capacity:     6 Minute Walk    Row Name 10/17/15 1535         6 Minute Walk   Distance 400 feet     Walk Time 2.9 minutes     # of Rest Breaks 1     MPH 1.7     METS 1.75     RPE 13     Perceived Dyspnea  4     Symptoms Yes (comment)     Comments Bcak pain due to bone spurs is the reason Alexis Ray had to stop     Resting HR 74 bpm     Resting BP 132/82     Max Ex. HR 103 bpm     Max Ex. BP 126/60        Psychological, QOL, Others - Outcomes: PHQ  2/9: [facility] screen [facility] 10/17/2015 07/09/2015 06/07/2015  Decreased Interest 0 0 0  Down, Depressed, Hopeless 0 0 0  PHQ - 2 Score 0 0 0  Altered sleeping 0 - -  Tired, decreased energy 0 - -  Change in appetite 0 - -  Feeling bad or failure about yourself  0 - -  Trouble concentrating 0 - -  Moving slowly or fidgety/restless 0 - -  Suicidal thoughts 0 - -  PHQ-9 Score 0 - -  Difficult doing work/chores Not difficult at all - -    Quality of Life:   Personal Goals: Goals established at orientation with interventions provided to work toward goal.     Personal Goals and Risk Factors at Admission - 10/17/15 1420      Core Components/Risk Factors/Patient Goals on Admission    Weight Management Yes;Obesity   Intervention Weight Management: Develop a combined nutrition and exercise program designed to reach desired caloric intake, while maintaining appropriate intake of nutrient and fiber, sodium and fats, and appropriate energy expenditure required for the weight goal.;Weight Management: Provide education and appropriate resources to help participant work on and attain dietary goals.;Weight Management/Obesity: Establish reasonable short term and long term weight  goals.;Obesity: Provide education and appropriate resources to help participant work on and attain dietary goals.  Already workin on weight loss , was 212 lbs last year.    Admit Weight 189 lb (85.7 kg)   Goal Weight: Short Term 185 lb (83.9 kg)   Goal Weight: Long Term 150 lb (68 kg)   Expected Outcomes Short Term: Continue to assess and modify interventions until short term weight is achieved;Long Term: Adherence to nutrition and physical activity/exercise program aimed toward attainment of established weight goal;Weight Loss: Understanding of general recommendations for a balanced deficit meal plan, which promotes 1-2 lb weight loss per week and includes a negative energy balance of [id number] kcal/d   Sedentary Yes   Intervention Provide advice, education, support and counseling about physical activity/exercise needs.;Develop an individualized exercise prescription for aerobic and resistive training based on initial evaluation findings, risk stratification, comorbidities and participant's personal goals.   Expected Outcomes Achievement of increased cardiorespiratory fitness and enhanced flexibility, muscular endurance and strength shown through measurements of functional capacity and personal statement of participant.   Tobacco Cessation Yes   Intervention Assist the participant in steps to quit. Provide individualized education and counseling about committing to Tobacco Cessation, relapse prevention, and pharmacological support that can be provided by physician.   Expected Outcomes Short Term: Will quit all tobacco product use, adhering to prevention of relapse plan.;Long Term: Complete abstinence from all tobacco products for at least 12 months from quit date.;Short Term: Will demonstrate readiness to quit, by selecting a quit date.  IS currently working on weaning off cigareetes.  Down to 2 cigaretts a day.  Wants to be quit by end of month.    Diabetes Yes   Intervention Provide education about  signs/symptoms and action to take for hypo/hyperglycemia.;Provide education about proper nutrition, including hydration, and aerobic/resistive exercise prescription along with prescribed medications to achieve blood glucose in normal ranges: Fasting glucose 65-99 mg/dL   Expected Outcomes Short Term: Participant verbalizes understanding of the signs/symptoms and immediate care of hyper/hypoglycemia, proper foot care and importance of medication, aerobic/resistive exercise and nutrition plan for blood glucose control.;Long Term: Attainment of HbA1C < 7%.   Heart Failure Yes   Intervention Provide a combined exercise and nutrition program that is supplemented with education,  support and counseling about heart failure. Directed toward relieving symptoms such as shortness of breath, decreased exercise tolerance, and extremity edema.   Expected Outcomes Improve functional capacity of life;Short term: Attendance in program 2-3 days a week with increased exercise capacity. Reported lower sodium intake. Reported increased fruit and vegetable intake. Reports medication compliance.;Short term: Daily weights obtained and reported for increase. Utilizing diuretic protocols set by physician.;Long term: Adoption of self-care skills and reduction of barriers for early signs and symptoms recognition and intervention leading to self-care maintenance.   Hypertension Yes   Intervention Provide education on lifestyle modifcations including regular physical activity/exercise, weight management, moderate sodium restriction and increased consumption of fresh fruit, vegetables, and low fat dairy, alcohol moderation, and smoking cessation.   Expected Outcomes Short Term: Continued assessment and intervention until BP is < 140/42mm HG in hypertensive participants. < 130/61mm HG in hypertensive participants with diabetes, heart failure or chronic kidney disease.;Long Term: Maintenance of blood pressure at goal levels.   Lipids Yes    Intervention Provide education and support for participant on nutrition & aerobic/resistive exercise along with prescribed medications to achieve LDL 70mg , HDL >40mg .   Expected Outcomes Short Term: Participant states understanding of desired cholesterol values and is compliant with medications prescribed. Participant is following exercise prescription and nutrition guidelines.;Long Term: Cholesterol controlled with medications as prescribed, with individualized exercise RX and with personalized nutrition plan. Value goals: LDL < 70mg , HDL > 40 mg.       Personal Goals Discharge:     Goals and Risk Factor Review    Row Name 12/13/15 0945 12/18/15 0930           Core Components/Risk Factors/Patient Goals Review   Personal Goals Review Weight Management/Obesity;Tobacco Cessation;Sedentary;Diabetes;Heart Failure;Hypertension;Lipids  -      Review Alexis Ray returned to day after being out for over a month.  She has maintained her weight and checks it on a daily basis (184-187).  She has not had any heart failure symptoms and watches her fluid and salt intake.  She is wearing her oxygen at home, but not out and about. She has an appt with Pulmonology tomorrow to discuss getting a smaller portable concentrator.  Her blood pressures and sugars have been stable.  No issues with her statins.  She is only walking around the house doing ADLs right now.  She is down to 3-4 cigarettes a day. Alexis Ray is still about the same. Pulse ox room air at home 93%. Alexis Ray said she is smoking 3 cigareets a day. She said she doesn't go to the [facility] now until the 22nd.       Expected Outcomes Alexis Ray will continue to come to exercise and education classes.  We will continue to monitor her for tobacco, HTN, DM, and HF.  -         Nutrition & Weight - Outcomes:     Pre Biometrics - 10/17/15 1532      Pre Biometrics   Height 5' 5.5" (1.664 m)   Weight 189 lb (85.7 kg)   Waist Circumference 47 inches   Hip Circumference  42.5 inches   Waist to Hip Ratio 1.11 %   BMI (Calculated) 31   Single Leg Stand 3 seconds       Nutrition:     Nutrition Therapy & Goals - 11/01/15 1119      Nutrition Therapy   Diet --  On insulin is counting carbs as the [employer] suggested to [place].Marland Kitchen  Drug/Food Interactions Statins/Certain Fruits  On insulin is counting carbs as the [employer] suggested to [place].      Personal Nutrition Goals   Personal Goal #1 Cont to count carbs as the [facility] dietician instructed her to.      Intervention Plan   Intervention Prescribe, educate and counsel regarding individualized specific dietary modifications aiming towards targeted core components such as weight, hypertension, lipid management, diabetes, heart failure and other comorbidities.   Expected Outcomes Short Term Goal: Understand basic principles of dietary content, such as calories, fat, sodium, cholesterol and nutrients.;Long Term Goal: Adherence to prescribed nutrition plan.      Nutrition Discharge:   Education Questionnaire Score:   Goals reviewed with patient; copy given to patient.

## 2016-01-08 NOTE — Progress Notes (Signed)
Cardiac Individual Treatment Plan  Patient Details  Name: Alexis Ray MRN: [id number] Date of Birth: 1956-03-20 Referring Provider:    Initial Encounter Date:  Flowsheet Row Cardiac Rehab from 10/17/2015 in [facility]  Date  10/17/15      Visit Diagnosis: Heart failure, chronic systolic (HCC)  Patient's Home Medications on Admission:  Current Outpatient Prescriptions:  .  albuterol (PROVENTIL) (2.5 MG/3ML) 0.083% nebulizer solution, Take 3 mLs (2.5 mg total) by nebulization every 6 (six) hours as needed for wheezing or shortness of breath., Disp: 75 mL, Rfl: 2 .  albuterol (VENTOLIN HFA) 108 (90 Base) MCG/ACT inhaler, Inhale into the lungs every 6 (six) hours as needed for wheezing or shortness of breath., Disp: , Rfl:  .  aspirin EC 81 MG tablet, Take 81 mg by mouth daily., Disp: , Rfl:  .  budesonide-formoterol (SYMBICORT) 160-4.5 MCG/ACT inhaler, Inhale 2 puffs into the lungs 2 (two) times daily., Disp: , Rfl:  .  carvedilol (COREG) 25 MG tablet, Take 12.5 mg by mouth 2 (two) times daily with a meal., Disp: , Rfl:  .  cilostazol (PLETAL) 100 MG tablet, Take 100 mg by mouth 2 (two) times daily., Disp: , Rfl:  .  diphenhydrAMINE (BENADRYL) 25 mg capsule, Take 25 mg by mouth every 6 (six) hours as needed., Disp: , Rfl:  .  docusate sodium (COLACE) 100 MG capsule, Take 100 mg by mouth daily as needed for mild constipation., Disp: , Rfl:  .  esomeprazole (NEXIUM) 40 MG capsule, Take 40 mg by mouth daily at 12 noon., Disp: , Rfl:  .  furosemide (LASIX) 40 MG tablet, Take 1 tablet (40 mg total) by mouth 2 (two) times daily., Disp: 60 tablet, Rfl: 5 .  insulin aspart (NOVOLOG) 100 UNIT/ML injection, Inject 10 Units into the skin 3 (three) times daily with meals., Disp: , Rfl:  .  insulin glargine (LANTUS) 100 UNIT/ML injection, Inject 40 Units into the skin at bedtime., Disp: , Rfl:  .  levocetirizine (XYZAL) 5 MG tablet, Take 5 mg by mouth at bedtime. , Disp: , Rfl:   .  metFORMIN (GLUCOPHAGE) 1000 MG tablet, Take 1,000 mg by mouth 2 (two) times daily with a meal., Disp: , Rfl:  .  Multiple Vitamins-Minerals (SENTRY SENIOR) TABS, Take 1 tablet by mouth daily., Disp: , Rfl:  .  nitroGLYCERIN (NITROSTAT) 0.4 MG SL tablet, Place 0.4 mg under the tongue every 5 (five) minutes as needed for chest pain., Disp: , Rfl:  .  pregabalin (LYRICA) 150 MG capsule, Take 150 mg by mouth 3 (three) times daily. , Disp: , Rfl:  .  rosuvastatin (CRESTOR) 40 MG tablet, Take 40 mg by mouth daily., Disp: , Rfl:  .  sacubitril-valsartan (ENTRESTO) 24-26 MG, Take 1 tablet by mouth 2 (two) times daily., Disp: 180 tablet, Rfl: 3 .  sacubitril-valsartan (ENTRESTO) 24-26 MG, Take 1 tablet by mouth 2 (two) times daily., Disp: 60 tablet, Rfl: 0 .  spironolactone (ALDACTONE) 50 MG tablet, Take 50 mg by mouth daily., Disp: , Rfl:  .  tiotropium (SPIRIVA) 18 MCG inhalation capsule, Place 18 mcg into inhaler and inhale daily., Disp: , Rfl:  .  Venlafaxine HCl 225 MG TB24, Take 225 mg by mouth daily. , Disp: , Rfl:   Past Medical History: Past Medical History:  Diagnosis Date  . CAD (coronary artery disease)   . Chronic combined systolic and diastolic CHF (congestive heart failure) (HCC)   . COPD (chronic obstructive pulmonary  disease) (HCC)   . Diabetes mellitus without complication (HCC)   . GERD (gastroesophageal reflux disease)   . Hypertension   . PVD (peripheral vascular disease) (HCC)   . Stroke (cerebrum) (HCC)     Tobacco Use: History  Smoking Status  . Current Every Day Smoker  Smokeless Tobacco  . Never Used    Labs: Recent Review Flowsheet Data    Labs for ITP Cardiac and Pulmonary Rehab Latest Ref Rng & Units 08/25/2011 01/22/2015 05/24/2015   Hemoglobin A1c 4.0 - 6.0 % 10.7(H) - 6.8(H)   PHART 7.350 - 7.450 - 7.43 -   PCO2ART 32.0 - 48.0 mmHg - 30(L) -   HCO3 21.0 - 28.0 mEq/L - 19.9(L) -   ACIDBASEDEF 0.0 - 2.0 mmol/L - 3.3(H) -   O2SAT % - 96.1 -        Exercise Target Goals:    Exercise Program Goal: Individual exercise prescription set with THRR, safety & activity barriers. Participant demonstrates ability to understand and report RPE using BORG scale, to self-measure pulse accurately, and to acknowledge the importance of the exercise prescription.  Exercise Prescription Goal: Starting with aerobic activity 30 plus minutes a day, 3 days per week for initial exercise prescription. Provide home exercise prescription and guidelines that participant acknowledges understanding prior to discharge.  Activity Barriers & Risk Stratification:     Activity Barriers & Cardiac Risk Stratification - 10/17/15 1432      Activity Barriers & Cardiac Risk Stratification   Activity Barriers Shortness of Breath;Neck/Spine Problems;Assistive Device  Has spurs in her back.  Concerned about her back pain increasing with more exercise. Does have a walker to help her manage to stop and rest when needed if she is out walking.    Cardiac Risk Stratification High      6 Minute Walk:     6 Minute Walk    Row Name 10/17/15 1535         6 Minute Walk   Distance 400 feet     Walk Time 2.9 minutes     # of Rest Breaks 1     MPH 1.7     METS 1.75     RPE 13     Perceived Dyspnea  4     Symptoms Yes (comment)     Comments Bcak pain due to bone spurs is the reason Alexis Ray had to stop     Resting HR 74 bpm     Resting BP 132/82     Max Ex. HR 103 bpm     Max Ex. BP 126/60        Initial Exercise Prescription:     Initial Exercise Prescription - 10/17/15 1500      Date of Initial Exercise RX and Referring Provider   Date 10/17/15     Oxygen   Oxygen Continuous   Liters 3     NuStep   Level 1   Watts 20   Minutes 15     Biostep-RELP   Level 1   Watts 20   Minutes 15     Prescription Details   Frequency (times per week) 2   Duration Progress to 30 minutes of continuous aerobic without signs/symptoms of physical distress      Intensity   THRR 40-80% of Max Heartrate 108-143   Ratings of Perceived Exertion 11-13   Perceived Dyspnea 0-4     Progression   Progression Continue to progress workloads to maintain intensity without signs/symptoms of  physical distress.     Resistance Training   Training Prescription Yes   Weight 1   Reps 10-15      Perform Capillary Blood Glucose checks as needed.  Exercise Prescription Changes:     Exercise Prescription Changes    Row Name 10/17/15 1500 10/31/15 1300 11/13/15 1300 12/13/15 1300 12/25/15 1300     Exercise Review   Progression  -  - Yes No  Returned after being out for over one month Yes     Response to Exercise   Blood Pressure (Admit) 132/82 128/66 118/60 130/74 124/70   Blood Pressure (Exercise) 126/60 128/68 128/58 126/64 122/70   Blood Pressure (Exit) 118/70 112/64 102/58 126/72 100/60   Heart Rate (Admit) 74 bpm 75 bpm 88 bpm 83 bpm 91 bpm   Heart Rate (Exercise) 96 bpm 90 bpm 78 bpm 94 bpm 101 bpm   Heart Rate (Exit) 73 bpm 71 bpm 76 bpm 82 bpm 80 bpm   Oxygen Saturation (Exercise)  -  - 99 % 93 %  -   Rating of Perceived Exertion (Exercise) 13 13 13 13 13    Perceived Dyspnea (Exercise) 4  -  - 0  -   Symptoms  -  -  -  - none   Duration  -  - Progress to 45 minutes of aerobic exercise without signs/symptoms of physical distress Progress to 45 minutes of aerobic exercise without signs/symptoms of physical distress Progress to 45 minutes of aerobic exercise without signs/symptoms of physical distress   Intensity  -  - THRR unchanged THRR unchanged THRR unchanged     Progression   Progression  -  - Continue to progress workloads to maintain intensity without signs/symptoms of physical distress. Continue to progress workloads to maintain intensity without signs/symptoms of physical distress. Continue to progress workloads to maintain intensity without signs/symptoms of physical distress.   Average METs  -  - 1.7 1.8 1.9     Resistance Training    Training Prescription  -  - Yes Yes Yes   Weight  -  - 1 2 lbs 2 lbs   Reps  -  - 10-15 10-15 10-15     Interval Training   Interval Training  -  - No No No     Oxygen   Oxygen  -  - Continuous Continuous  -   Liters  -  - 3 2-4  -     Treadmill   MPH  - 1  -  -  -   Grade  - 0  -  -  -   Minutes  - 5  -  -  -     NuStep   Level  - 2 2 2 2    Watts  - 11 -  1.7 METs -  1.9 METs -  1.9 METs   Minutes  - 15 22 15 15      Arm Ergometer   Level  -  -  - 1 1   Minutes  -  -  - 15 15   METs  -  -  - 1.7 2      Exercise Comments:     Exercise Comments    Row Name 10/23/15 0840 11/13/15 1354 11/29/15 1105 12/13/15 0836 12/13/15 0943   Exercise Comments First full day of exercise!  Patient was oriented to gym and equipment including functions, settings, policies, and procedures.  Patient's individual exercise prescription and treatment plan were reviewed.  All starting workloads were established based on the results of the 6 minute walk test done at initial orientation visit.  The plan for exercise progression was also introduced and progression will be customized based on patient's performance and goals. Pt has only attended once since last review and stayed on NuStep for the entire time that day.  She is off to a good start and we will continue to monitor for progression. Alexis Ray has been out since last review.  She strained her breast and has now sprained her ankle.  She had xrays done today.  She hopes to be able to return next week.  We may need to relook at her equipment depending on how her ankle is feeling upon return.  Alexis Ray returned today from being out since 6/29.  She was able to return to her current workloads without a problem.  After exercise Alexis Ray's blood sugar was 54. She was given 4 glucose tab.  Recheck was 134!  Alex[facility] Ray was encouraged to eat protein for breakfast and to check her blood sugar prior to exercise. We had a hard time getting Mirissa's O2 Sat today during exercise.   When it did show up, it was in the mid 80s but she did not feel any shortness of breath.  We did titrate her oxygen up to 4L.  At cool down, she was 92%.   Row Name 12/18/15 1023 12/25/15 1340 01/08/16 1551       Exercise Comments Reviewed METs average and discussed progression with pt today. Alexis Ray is doing well with exercise.  She has missed the last two sessions. Alexis Ray to check on status. Out since last review.  Will drop from program at this time per pt.        Discharge Exercise Prescription (Final Exercise Prescription Changes):     Exercise Prescription Changes - 12/25/15 1300      Exercise Review   Progression Yes     Response to Exercise   Blood Pressure (Admit) 124/70   Blood Pressure (Exercise) 122/70   Blood Pressure (Exit) 100/60   Heart Rate (Admit) 91 bpm   Heart Rate (Exercise) 101 bpm   Heart Rate (Exit) 80 bpm   Rating of Perceived Exertion (Exercise) 13   Symptoms none   Duration Progress to 45 minutes of aerobic exercise without signs/symptoms of physical distress   Intensity THRR unchanged     Progression   Progression Continue to progress workloads to maintain intensity without signs/symptoms of physical distress.   Average METs 1.9     Resistance Training   Training Prescription Yes   Weight 2 lbs   Reps 10-15     Interval Training   Interval Training No     NuStep   Level 2   Watts --  1.9 METs   Minutes 15     Arm Ergometer   Level 1   Minutes 15   METs 2      Nutrition:  Target Goals: Understanding of nutrition guidelines, daily intake of sodium 1500mg , cholesterol 200mg , calories 30% from fat and 7% or less from saturated fats, daily to have 5 or more servings of fruits and vegetables.  Biometrics:     Pre Biometrics - 10/17/15 1532      Pre Biometrics   Height 5' 5.5" (1.664 m)   Weight 189 lb (85.7 kg)   Waist Circumference 47 inches   Hip Circumference 42.5 inches   Waist to Hip Ratio 1.11 %   BMI (Calculated) 31  Single Leg Stand 3 seconds       Nutrition Therapy Plan and Nutrition Goals:     Nutrition Therapy & Goals - 11/01/15 1119      Nutrition Therapy   Diet --  On insulin is counting carbs as the [employer] suggested to [place]..    Drug/Food Interactions Statins/Certain Fruits  On insulin is counting carbs as the [employer] suggested to [place].      Personal Nutrition Goals   Personal Goal #1 Cont to count carbs as the [facility] dietician instructed her to.      Intervention Plan   Intervention Prescribe, educate and counsel regarding individualized specific dietary modifications aiming towards targeted core components such as weight, hypertension, lipid management, diabetes, heart failure and other comorbidities.   Expected Outcomes Short Term Goal: Understand basic principles of dietary content, such as calories, fat, sodium, cholesterol and nutrients.;Long Term Goal: Adherence to prescribed nutrition plan.      Nutrition Discharge: Rate Your Plate Scores:   Nutrition Goals Re-Evaluation:     Nutrition Goals Re-Evaluation    Row Name 11/01/15 1122 12/18/15 0937           Personal Goal #1 Re-Evaluation   Personal Goal #1  - Still counting carbs and taking her insulin      Goal Progress Seen Yes  -      Comments On insulin is counting carbs as the [employer] suggested to Alexis Ray . Alexis Ray  said she doesn't need to meet individually with the Cardiac Rehab Registered Dietician.  -         Psychosocial: Target Goals: Acknowledge presence or absence of depression, maximize coping skills, provide positive support system. Participant is able to verbalize types and ability to use techniques and skills needed for reducing stress and depression.  Initial Review & Psychosocial Screening:     Initial Psych Review & Screening - 10/17/15 1425      Initial Review   Current issues with Current Sleep Concerns  Does not sleep good,  always wakes up early and takes time  to get back to  sleep if tries to go back to sleep.   Not a new action.      Family Dynamics   Good Support System? Yes  2 daughters, grand children nearby and help as needed.   One lives with Alexis Ray and one in close range.     Barriers   Psychosocial barriers to participate in program There are no identifiable barriers or psychosocial needs.;The patient should benefit from training in stress management and relaxation.     Screening Interventions   Interventions Encouraged to exercise      Quality of Life Scores:   PHQ-9: Recent Review Flowsheet Data    Depression screen Alexis Ray 2/9 10/17/2015 07/09/2015 06/07/2015   Decreased Interest 0 0 0   Down, Depressed, Hopeless 0 0 0   PHQ - 2 Score 0 0 0   Altered sleeping 0 - -   Tired, decreased energy 0 - -   Change in appetite 0 - -   Feeling bad or failure about yourself  0 - -   Trouble concentrating 0 - -   Moving slowly or fidgety/restless 0 - -   Suicidal thoughts 0 - -   PHQ-9 Score 0 - -   Difficult doing work/chores Not difficult at all - -      Psychosocial Evaluation and Intervention:   Psychosocial Re-Evaluation:     Psychosocial Re-Evaluation  Row Name 12/18/15 [phone]             Psychosocial Re-Evaluation   Interventions Encouraged to attend Cardiac Rehabilitation for the exercise       Comments Alexis Ray said she feels Cardiac Rehab is goin well for her.           Vocational Rehabilitation: Provide vocational rehab assistance to qualifying candidates.   Vocational Rehab Evaluation & Intervention:     Vocational Rehab - 10/17/15 1432      Initial Vocational Rehab Evaluation & Intervention   Assessment shows need for Vocational Rehabilitation No      Education: Education Goals: Education classes will be provided on a weekly basis, covering required topics. Participant will state understanding/return demonstration of topics presented.  Learning Barriers/Preferences:     Learning Barriers/Preferences - 10/17/15 1431       Learning Barriers/Preferences   Learning Barriers Exercise Concerns   Learning Preferences None      Education Topics: General Nutrition Guidelines/Fats and Fiber: -Group instruction provided by verbal, written material, models and posters to present the general guidelines for heart healthy nutrition. Gives an explanation and review of dietary fats and fiber.   Controlling Sodium/Reading Food Labels: -Group verbal and written material supporting the discussion of sodium use in heart healthy nutrition. Review and explanation with models, verbal and written materials for utilization of the food label.   Exercise Physiology & Risk Factors: - Group verbal and written instruction with models to review the exercise physiology of the cardiovascular system and associated critical values. Details cardiovascular disease risk factors and the goals associated with each risk factor.   Aerobic Exercise & Resistance Training: - Gives group verbal and written discussion on the health impact of inactivity. On the components of aerobic and resistive training programs and the benefits of this training and how to safely progress through these programs. Flowsheet Row Cardiac Rehab from 12/18/2015 in [facility] Pul[facility] Re[facility]  Date  12/13/15  Educator  AS  Instruction Review Code  R- Review/reinforce [Second Class]      Flexibility, Balance, General Exercise Guidelines: - Provides group verbal and written instruction on the benefits of flexibility and balance training programs. Provides general exercise guidelines with specific guidelines to those with heart or lung disease. Demonstration and skill practice provided. Flowsheet Row Cardiac Rehab from 12/18/2015 in [facility] Cardiac and Pulmonary Rehab  Date  12/18/15  Educator  Della Goo  Instruction Review Code  2- meets goals/outcomes      Stress Management: - Provides group verbal and written instruction about the health risks of elevated  stress, cause of high stress, and healthy ways to reduce stress. Flowsheet Row Cardiac Rehab from 12/18/2015 in [facility] Cardiac and Pulmonary Rehab  Date  11/01/15  Educator  C. EnterkinRN  Instruction Review Code  2- meets goals/outcomes      Depression: - Provides group verbal and written instruction on the correlation between heart/lung disease and depressed mood, treatment options, and the stigmas associated with seeking treatment.   Anatomy & Physiology of the Heart: - Group verbal and written instruction and models provide basic cardiac anatomy and physiology, with the coronary electrical and arterial systems. Review of: AMI, Angina, Valve disease, Heart Failure, Cardiac Arrhythmia, Pacemakers, and the ICD.   Cardiac Procedures: - Group verbal and written instruction and models to describe the testing methods done to diagnose heart disease. Reviews the outcomes of the test results. Describes the treatment choices: Medical Management, Angioplasty, or Coronary Bypass Surgery.  Cardiac Medications: - Group verbal and written instruction to review commonly prescribed medications for heart disease. Reviews the medication, class of the drug, and side effects. Includes the steps to properly store meds and maintain the prescription regimen.   Go Sex-Intimacy & Heart Disease, Get SMART - Goal Setting: - Group verbal and written instruction through game format to discuss heart disease and the return to sexual intimacy. Provides group verbal and written material to discuss and apply goal setting through the application of the S.M.A.R.T. Method.   Other Matters of the Heart: - Provides group verbal, written materials and models to describe Heart Failure, Angina, Valve Disease, and Diabetes in the realm of heart disease. Includes description of the disease process and treatment options available to the cardiac patient.   Exercise & Equipment Safety: - Individual verbal instruction and  demonstration of equipment use and safety with use of the equipment. Flowsheet [facility]  Date  10/17/15  Educator  SB  Instruction Review Code  2- meets goals/outcomes      Infection Prevention: - Provides verbal and written material to individual with discussion of infection control including proper hand washing and proper equipment cleaning during exercise session. Flowsheet Row [facility]  Date  10/17/15  Educator  SB  Instruction Review Code  2- meets goals/outcomes      Falls Prevention: - Provides verbal and written material to individual with discussion of falls prevention and safety. Flowsheet [facility]  Date  10/17/15  Educator  SB2  Instruction Review Code  2- meets goals/outcomes      Diabetes: - Individual verbal and written instruction to review signs/symptoms of diabetes, desired ranges of glucose level fasting, after meals and with exercise. Advice that pre and post exercise glucose checks will be done for 3 sessions at entry of program. Flowsheet Row [facility]  Date  10/17/15  Educator  SB  Instruction Review Code  2- meets goals/outcomes       Knowledge Questionnaire Score:   Core Components/Risk Factors/Patient Goals at Admission:     Personal Goals and Risk Factors at Admission - 10/17/15 1420      Core Components/Risk Factors/Patient Goals on Admission    Weight Management Yes;Obesity   Intervention Weight Management: Develop a combined nutrition and exercise program designed to reach desired caloric intake, while maintaining appropriate intake of nutrient and fiber, sodium and fats, and appropriate energy expenditure required for the weight goal.;Weight Management: Provide education and appropriate resources to help  participant work on and attain dietary goals.;Weight Management/Obesity: Establish reasonable short term and long term weight goals.;Obesity: Provide education and appropriate resources to help participant work on and attain dietary goals.  Already workin on weight loss , was 212 lbs last year.    Admit Weight 189 lb (85.7 kg)   Goal Weight: Short Term 185 lb (83.9 kg)   Goal Weight: Long Term 150 lb (68 kg)   Expected Outcomes Short Term: Continue to assess and modify interventions until short term weight is achieved;Long Term: Adherence to nutrition and physical activity/exercise program aimed toward attainment of established weight goal;Weight Loss: Understanding of general recommendations for a balanced deficit meal plan, which promotes 1-2 lb weight loss per week and includes a negative energy balance of [phone] kcal/d   Sedentary Yes  Intervention Provide advice, education, support and counseling about physical activity/exercise needs.;Develop an individualized exercise prescription for aerobic and resistive training based on initial evaluation findings, risk stratification, comorbidities and participant's personal goals.   Expected Outcomes Achievement of increased cardiorespiratory fitness and enhanced flexibility, muscular endurance and strength shown through measurements of functional capacity and personal statement of participant.   Tobacco Cessation Yes   Intervention Assist the participant in steps to quit. Provide individualized education and counseling about committing to Tobacco Cessation, relapse prevention, and pharmacological support that can be provided by physician.   Expected Outcomes Short Term: Will quit all tobacco product use, adhering to prevention of relapse plan.;Long Term: Complete abstinence from all tobacco products for at least 12 months from quit date.;Short Term: Will demonstrate readiness to quit, by selecting a quit date.  IS currently working on weaning off  cigareetes.  Down to 2 cigaretts a day.  Wants to be quit by end of month.    Diabetes Yes   Intervention Provide education about signs/symptoms and action to take for hypo/hyperglycemia.;Provide education about proper nutrition, including hydration, and aerobic/resistive exercise prescription along with prescribed medications to achieve blood glucose in normal ranges: Fasting glucose 65-99 mg/dL   Expected Outcomes Short Term: Participant verbalizes understanding of the signs/symptoms and immediate care of hyper/hypoglycemia, proper foot care and importance of medication, aerobic/resistive exercise and nutrition plan for blood glucose control.;Long Term: Attainment of HbA1C < 7%.   Heart Failure Yes   Intervention Provide a combined exercise and nutrition program that is supplemented with education, support and counseling about heart failure. Directed toward relieving symptoms such as shortness of breath, decreased exercise tolerance, and extremity edema.   Expected Outcomes Improve functional capacity of life;Short term: Attendance in program 2-3 days a week with increased exercise capacity. Reported lower sodium intake. Reported increased fruit and vegetable intake. Reports medication compliance.;Short term: Daily weights obtained and reported for increase. Utilizing diuretic protocols set by physician.;Long term: Adoption of self-care skills and reduction of barriers for early signs and symptoms recognition and intervention leading to self-care maintenance.   Hypertension Yes   Intervention Provide education on lifestyle modifcations including regular physical activity/exercise, weight management, moderate sodium restriction and increased consumption of fresh fruit, vegetables, and low fat dairy, alcohol moderation, and smoking cessation.   Expected Outcomes Short Term: Continued assessment and intervention until BP is < 140/65mm HG in hypertensive participants. < 130/24mm HG in hypertensive  participants with diabetes, heart failure or chronic kidney disease.;Long Term: Maintenance of blood pressure at goal levels.   Lipids Yes   Intervention Provide education and support for participant on nutrition & aerobic/resistive exercise along with prescribed medications to achieve LDL 70mg , HDL >40mg .   Expected Outcomes Short Term: Participant states understanding of desired cholesterol values and is compliant with medications prescribed. Participant is following exercise prescription and nutrition guidelines.;Long Term: Cholesterol controlled with medications as prescribed, with individualized exercise RX and with personalized nutrition plan. Value goals: LDL < 70mg , HDL > 40 mg.      Core Components/Risk Factors/Patient Goals Review:      Goals and Risk Factor Review    Row Name 12/13/15 0945 12/18/15 0930           Core Components/Risk Factors/Patient Goals Review   Personal Goals Review Weight Management/Obesity;Tobacco Cessation;Sedentary;Diabetes;Heart Failure;Hypertension;Lipids  -      Review Alexis Ray returned to day after being out for over a month.  She has maintained her weight and checks it on a daily basis (  [id number]).  She has not had any heart failure symptoms and watches her fluid and salt intake.  She is wearing her oxygen at home, but not out and about. She has an appt with Pulmonology tomorrow to discuss getting a smaller portable concentrator.  Her blood pressures and sugars have been stable.  No issues with her statins.  She is only walking around the house doing ADLs right now.  She is down to 3-4 cigarettes a day. Alexis Ray is still about the same. Pulse ox room air at home 93%. Alexis Ray said she is smoking 3 cigareets a day. She said she doesn't go to the [facility] now until the 22nd.       Expected Outcomes Alexis Ray will continue to come to exercise and education classes.  We will continue to monitor her for tobacco, HTN, DM, and HF.  -         Core Components/Risk  Factors/Patient Goals at Discharge (Final Review):      Goals and Risk Factor Review - 12/18/15 0930      Core Components/Risk Factors/Patient Goals Review   Review Alexis Ray is still about the same. Pulse ox room air at home 93%. Alexis Ray said she is smoking 3 cigareets a day. She said she doesn't go to the [facility] now until the 22nd.       ITP Comments:     ITP Comments    Row Name 10/17/15 1434 10/17/15 1641 10/24/15 0850 10/30/15 1051 11/01/15 1121   ITP Comments Mediccal review completed today. Initial ITP done.  Continue with ITP Mediccal review completed today. Initial ITP done.  Continue with ITP 30 day review.  Continue with ITP   New to program Patient called department to let staff know she missed class because she has not been feeling well and having difficulty sleeping at night.  She reported she did not get to sleep until 3 a.m. this morning.  Therefore, she was unable to attend class this morning.  Hopes to return soon.   On insulin is counting carbs as the [employer] suggested to Alexis Ray . Alexis Ray  said she doesn't need to meet individually with the Cardiac Rehab Registered Dietician.    Row Name 11/21/15 0908 11/29/15 1103 12/18/15 0927 12/19/15 0709 01/08/16 1551   ITP Comments 30 day review. Continue with ITP last visit 6/29 Mariateresa has been out since last review.  She strained her breast and has now sprained her ankle.  She had xrays done today.  She hopes to be able to return next week. Laurine is here exercising today. She said her right foot was not sprained "but they thought it might be". "They are going to send me to a foot doctor since the top of my foot hurts. We did not let her get on the treadmill today since she is wearing slippers since the top of her right foot hurts.  30 day review. Continue with ITP unless changes noted by Medical Director at signature of review. Out since last review. Been out with doctors appointments.  Asked to discontinue program at this time.       Comments: Discharge ITP

## 2016-03-28 ENCOUNTER — Emergency Department
Admission: EM | Admit: 2016-03-28 | Discharge: 2016-03-28 | Disposition: A | Discharge status: Home / Self Care | Payer: Medicare HMO | Attending: Emergency Medicine | Admitting: Emergency Medicine

## 2016-03-28 ENCOUNTER — Emergency Department: Payer: Medicare HMO

## 2016-03-28 ENCOUNTER — Encounter: Payer: Self-pay | Admitting: Medical Oncology

## 2016-03-28 DIAGNOSIS — Y929 Unspecified place or not applicable: Secondary | ICD-10-CM | POA: Insufficient documentation

## 2016-03-28 DIAGNOSIS — I5042 Chronic combined systolic (congestive) and diastolic (congestive) heart failure: Secondary | ICD-10-CM | POA: Insufficient documentation

## 2016-03-28 DIAGNOSIS — Z794 Long term (current) use of insulin: Secondary | ICD-10-CM | POA: Diagnosis not present

## 2016-03-28 DIAGNOSIS — S7001XA Contusion of right hip, initial encounter: Secondary | ICD-10-CM | POA: Insufficient documentation

## 2016-03-28 DIAGNOSIS — I2510 Atherosclerotic heart disease of native coronary artery without angina pectoris: Secondary | ICD-10-CM | POA: Insufficient documentation

## 2016-03-28 DIAGNOSIS — I110 Hypertensive heart disease with heart failure: Secondary | ICD-10-CM | POA: Insufficient documentation

## 2016-03-28 DIAGNOSIS — W010XXA Fall on same level from slipping, tripping and stumbling without subsequent striking against object, initial encounter: Secondary | ICD-10-CM | POA: Diagnosis not present

## 2016-03-28 DIAGNOSIS — Z79899 Other long term (current) drug therapy: Secondary | ICD-10-CM | POA: Insufficient documentation

## 2016-03-28 DIAGNOSIS — Y939 Activity, unspecified: Secondary | ICD-10-CM | POA: Insufficient documentation

## 2016-03-28 DIAGNOSIS — F17200 Nicotine dependence, unspecified, uncomplicated: Secondary | ICD-10-CM | POA: Insufficient documentation

## 2016-03-28 DIAGNOSIS — Y999 Unspecified external cause status: Secondary | ICD-10-CM | POA: Diagnosis not present

## 2016-03-28 DIAGNOSIS — E119 Type 2 diabetes mellitus without complications: Secondary | ICD-10-CM | POA: Diagnosis not present

## 2016-03-28 DIAGNOSIS — S79911A Unspecified injury of right hip, initial encounter: Secondary | ICD-10-CM | POA: Diagnosis present

## 2016-03-28 MED ORDER — HYDROCODONE-ACETAMINOPHEN 5-325 MG PO TABS
1.0000 | ORAL_TABLET | Freq: Once | ORAL | Status: AC
  Administered 2016-03-28: 1 via ORAL
  Filled 2016-03-28: qty 1

## 2016-03-28 MED ORDER — HYDROCODONE-ACETAMINOPHEN 5-325 MG PO TABS: 1.0000 | ORAL_TABLET | Freq: Four times a day (QID) | ORAL | 0 refills | Status: DC | PRN

## 2016-03-28 NOTE — ED Triage Notes (Signed)
Pt tripped and fell last night and since then has been having rt hip pain and lower back pain.

## 2016-03-28 NOTE — Discharge Instructions (Signed)
Ice and elevate as needed for swelling and pain. Take Norco as needed for pain. Be aware that this medication could cause drowsiness increase your  risk for falling. Follow-up with your primary care doctor if any continued problems.

## 2016-03-28 NOTE — ED Provider Notes (Signed)
[facility] Emergency Department Provider Note   ____________________________________________   First MD Initiated Contact with Patient 03/28/16 1704     (approximate)  I have reviewed the triage vital signs and the nursing notes.   HISTORY  Chief Complaint Fall and Hip Pain    HPI Alexis Ray is a 60 y.o. female still complaining of right hip pain. Patient states that she tripped last night and fell. She has been unable to put her entire weight on her right leg today with walking. Patient denies any previous injury to her hip. She denies any head injury or loss of consciousness with her fall. She denies any other injuries. She rates her pain as 9/10.   Past Medical History:  Diagnosis Date  . CAD (coronary artery disease)   . Chronic combined systolic and diastolic CHF (congestive heart failure) (HCC)   . COPD (chronic obstructive pulmonary disease) (HCC)   . Diabetes mellitus without complication (HCC)   . GERD (gastroesophageal reflux disease)   . Hypertension   . PVD (peripheral vascular disease) (HCC)   . 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 Chronic systolic heart failure (HCC) 06/07/2015  . Numbness in both legs 06/07/2015  . Tobacco use 06/07/2015  . GERD (gastroesophageal reflux disease) 05/23/2015  . Type 2 diabetes mellitus (HCC) 05/23/2015  . HTN (hypertension) 05/23/2015  . PVD (peripheral vascular disease) (HCC) 05/23/2015  . OSA on CPAP 05/23/2015    Past Surgical History:  Procedure Laterality Date  . ABDOMINAL HYSTERECTOMY    . CARDIAC CATHETERIZATION    . COLONOSCOPY      Prior to Admission medications   Medication Sig Start Date End Date Taking? Authorizing Provider  albuterol (PROVENTIL) (2.5 MG/3ML) 0.083% nebulizer solution Take 3 mLs (2.5 mg total) by nebulization every 6 (six) hours as needed for wheezing or shortness of breath. 05/26/15   Altamese DillingVaibhavkumar Vachhani, MD    albuterol (VENTOLIN HFA) 108 (90 Base) MCG/ACT inhaler Inhale into the lungs every 6 (six) hours as needed for wheezing or shortness of breath.    Historical Provider, MD  aspirin EC 81 MG tablet Take 81 mg by mouth daily.    Historical Provider, MD  budesonide-formoterol (SYMBICORT) 160-4.5 MCG/ACT inhaler Inhale 2 puffs into the lungs 2 (two) times daily.    Historical Provider, MD  carvedilol (COREG) 25 MG tablet Take 12.5 mg by mouth 2 (two) times daily with a meal.    Historical Provider, MD  cilostazol (PLETAL) 100 MG tablet Take 100 mg by mouth 2 (two) times daily.    Historical Provider, MD  diphenhydrAMINE (BENADRYL) 25 mg capsule Take 25 mg by mouth every 6 (six) hours as needed.    Historical Provider, MD  docusate sodium (COLACE) 100 MG capsule Take 100 mg by mouth daily as needed for mild constipation.    Historical Provider, MD  esomeprazole (NEXIUM) 40 MG capsule Take 40 mg by mouth daily at 12 noon.    Historical Provider, MD  furosemide (LASIX) 40 MG tablet Take 1 tablet (40 mg total) by mouth 2 (two) times daily. 07/09/15 07/08/16  Delma Freezeina A Hackney, FNP  HYDROcodone-acetaminophen (NORCO/VICODIN) 5-325 MG tablet Take 1 tablet by mouth every 6 (six) hours as needed for moderate pain. 03/28/16   Tommi Rumpshonda L Ralphael [place], PA-C  insulin aspart (NOVOLOG) 100 UNIT/ML injection Inject 10 Units into the skin 3 (three) times daily with meals.    Historical Provider,  MD  insulin glargine (LANTUS) 100 UNIT/ML injection Inject 40 Units into the skin at bedtime.    Historical Provider, MD  levocetirizine (XYZAL) 5 MG tablet Take 5 mg by mouth at bedtime.     Historical Provider, MD  metFORMIN (GLUCOPHAGE) 1000 MG tablet Take 1,000 mg by mouth 2 (two) times daily with a meal.    Historical Provider, MD  Multiple Vitamins-Minerals (SENTRY SENIOR) TABS Take 1 tablet by mouth daily.    Historical Provider, MD  nitroGLYCERIN (NITROSTAT) 0.4 MG SL tablet Place 0.4 mg under the tongue every 5 (five) minutes as  needed for chest pain.    Historical Provider, MD  pregabalin (LYRICA) 150 MG capsule Take 150 mg by mouth 3 (three) times daily.     Historical Provider, MD  rosuvastatin (CRESTOR) 40 MG tablet Take 40 mg by mouth daily.    Historical Provider, MD  sacubitril-valsartan (ENTRESTO) 24-26 MG Take 1 tablet by mouth 2 (two) times daily. 07/09/15   Delma Freezeina A Hackney, FNP  sacubitril-valsartan (ENTRESTO) 24-26 MG Take 1 tablet by mouth 2 (two) times daily. 07/12/15   Delma Freezeina A Hackney, FNP  spironolactone (ALDACTONE) 50 MG tablet Take 50 mg by mouth daily.    Historical Provider, MD  tiotropium (SPIRIVA) 18 MCG inhalation capsule Place 18 mcg into inhaler and inhale daily.    Historical Provider, MD  Venlafaxine HCl 225 MG TB24 Take 225 mg by mouth daily.     Historical Provider, MD    Allergies Levaquin [levofloxacin in d5w]  Family History  Problem Relation Age of Onset  . Diabetes Mother   . Cancer Father   . Diabetes Maternal Aunt   . Diabetes Maternal Aunt     Social History Social History  Substance Use Topics  . Smoking status: Current Every Day Smoker  . Smokeless tobacco: Never Used  . Alcohol use No    Review of Systems Constitutional: No fever/chills Eyes: No visual changes. ENT: No trauma Cardiovascular: Denies chest pain. Respiratory: Denies shortness of breath. Gastrointestinal: No abdominal pain.  No nausea, no vomiting.  Musculoskeletal: Positive for right hip pain. Skin: Negative for rash. Neurological: Negative for headaches, focal weakness or numbness.  10-point ROS otherwise negative.  ____________________________________________   PHYSICAL EXAM:  VITAL SIGNS: ED Triage Vitals  Enc Vitals Group     BP 03/28/16 1625 137/61     Pulse Rate 03/28/16 1625 88     Resp 03/28/16 1625 18     Temp 03/28/16 1625 98.1 F (36.7 C)     Temp Source 03/28/16 1625 Oral     SpO2 03/28/16 1625 96 %     Weight 03/28/16 1620 190 lb (86.2 kg)     Height 03/28/16 1620 5\' 5"   (1.651 m)     Head Circumference --      Peak Flow --      Pain Score 03/28/16 1621 9     Pain Loc --      Pain Edu? --      Excl. in GC? --     Constitutional: Alert and oriented. Well appearing and in no acute distress. Eyes: Conjunctivae are normal. PERRL. EOMI. Head: Atraumatic. Nose: No congestion/rhinnorhea. Mouth/Throat: Mucous membranes are moist.  Oropharynx non-erythematous. Neck: No stridor.  Cardiovascular: Normal rate, regular rhythm. Grossly normal heart sounds.  Good peripheral circulation. Respiratory: Normal respiratory effort.  No retractions. Lungs CTAB. Gastrointestinal: Soft and nontender. No distention. Bowel sounds normoactive 4 quadrants. Musculoskeletal: Examination of the back there is  no gross deformity noted and no ecchymosis or abrasions were noted. There is minimal tenderness on palpation of the lumbar spine and paravertebral muscles. There is tenderness on palpation of the right hip but is posterior and lateral aspect. Range of motion is difficult secondary to patient's pain. No gross deformity was noted and no ecchymosis or abrasions were seen to the hip. There was no rotation or shortening of the right leg. Neurologic:  Normal speech and language. No gross focal neurologic deficits are appreciated. No gait instability. Skin:  Skin is warm, dry and intact. As noted above all muscle skeletal. Psychiatric: Mood and affect are normal. Speech and behavior are normal.  ____________________________________________   LABS (all labs ordered are listed, but only abnormal results are displayed)  Labs Reviewed - No data to display RADIOLOGY  Right hip x-ray per radiologist is negative for acute fracture or dislocation. I, Tommi Rumps, personally viewed and evaluated these images (plain radiographs) as part of my medical decision making, as well as reviewing the written report by the  radiologist. ____________________________________________   PROCEDURES  Procedure(s) performed: None  Procedures  Critical Care performed: No  ____________________________________________   INITIAL IMPRESSION / ASSESSMENT AND PLAN / ED COURSE  Pertinent labs & imaging results that were available during my care of the patient were reviewed by me and considered in my medical decision making (see chart for details).    Clinical Course    Patient was given Norco while in the emergency room and x-ray findings were discussed. Patient was given a prescription for Norco as needed for pain. She is aware that this may cause drowsiness increase her risk for falling. She is encouraged to use ice as needed for discomfort. She'll follow up with her primary care doctor if any continued problems.  ____________________________________________   FINAL CLINICAL IMPRESSION(S) / ED DIAGNOSES  Final diagnoses:  Contusion of right hip, initial encounter      NEW MEDICATIONS STARTED DURING THIS VISIT:  Discharge Medication List as of 03/28/2016  5:47 PM    START taking these medications   Details  HYDROcodone-acetaminophen (NORCO/VICODIN) 5-325 MG tablet Take 1 tablet by mouth every 6 (six) hours as needed for moderate pain., Starting Fri 03/28/2016, Print         Note:  This document was prepared using Dragon voice recognition software and may include unintentional dictation errors.    Tommi Rumps, PA-C 03/28/16 2106    Phineas Semen, MD 03/28/16 2121

## 2016-03-28 NOTE — ED Notes (Signed)
E signature pad not working. Patient given discharge instructions. Patient verbalizes understanding. No further questions addressed.

## 2016-03-28 NOTE — ED Notes (Signed)
Right hip pain after fall. Patient A&O x4.

## 2016-04-22 ENCOUNTER — Encounter: Payer: Self-pay | Admitting: Emergency Medicine

## 2016-04-22 ENCOUNTER — Emergency Department
Admission: EM | Admit: 2016-04-22 | Discharge: 2016-04-22 | Disposition: A | Discharge status: Home / Self Care | Payer: Medicare HMO | Attending: Emergency Medicine | Admitting: Emergency Medicine

## 2016-04-22 DIAGNOSIS — Z794 Long term (current) use of insulin: Secondary | ICD-10-CM | POA: Insufficient documentation

## 2016-04-22 DIAGNOSIS — I2510 Atherosclerotic heart disease of native coronary artery without angina pectoris: Secondary | ICD-10-CM | POA: Insufficient documentation

## 2016-04-22 DIAGNOSIS — G4489 Other headache syndrome: Secondary | ICD-10-CM

## 2016-04-22 DIAGNOSIS — J449 Chronic obstructive pulmonary disease, unspecified: Secondary | ICD-10-CM | POA: Insufficient documentation

## 2016-04-22 DIAGNOSIS — Z79899 Other long term (current) drug therapy: Secondary | ICD-10-CM | POA: Diagnosis not present

## 2016-04-22 DIAGNOSIS — Z7982 Long term (current) use of aspirin: Secondary | ICD-10-CM | POA: Insufficient documentation

## 2016-04-22 DIAGNOSIS — E119 Type 2 diabetes mellitus without complications: Secondary | ICD-10-CM | POA: Diagnosis not present

## 2016-04-22 DIAGNOSIS — F17210 Nicotine dependence, cigarettes, uncomplicated: Secondary | ICD-10-CM | POA: Insufficient documentation

## 2016-04-22 DIAGNOSIS — R51 Headache: Secondary | ICD-10-CM | POA: Diagnosis present

## 2016-04-22 DIAGNOSIS — I110 Hypertensive heart disease with heart failure: Secondary | ICD-10-CM | POA: Diagnosis not present

## 2016-04-22 DIAGNOSIS — I5042 Chronic combined systolic (congestive) and diastolic (congestive) heart failure: Secondary | ICD-10-CM | POA: Diagnosis not present

## 2016-04-22 MED ORDER — DIPHENHYDRAMINE HCL 25 MG PO CAPS
50.0000 mg | ORAL_CAPSULE | Freq: Once | ORAL | Status: AC
  Administered 2016-04-22: 50 mg via ORAL

## 2016-04-22 MED ORDER — DIPHENHYDRAMINE HCL 25 MG PO CAPS
ORAL_CAPSULE | ORAL | Status: AC
  Administered 2016-04-22: 50 mg via ORAL
  Filled 2016-04-22: qty 1

## 2016-04-22 MED ORDER — ACETAMINOPHEN 500 MG PO TABS
ORAL_TABLET | ORAL | Status: AC
  Administered 2016-04-22: 1000 mg via ORAL
  Filled 2016-04-22: qty 2

## 2016-04-22 MED ORDER — METOCLOPRAMIDE HCL 10 MG PO TABS: 10.0000 mg | ORAL_TABLET | Freq: Four times a day (QID) | ORAL | 0 refills | Status: DC | PRN

## 2016-04-22 MED ORDER — ACETAMINOPHEN 500 MG PO TABS
1000.0000 mg | ORAL_TABLET | Freq: Once | ORAL | Status: AC
  Administered 2016-04-22: 1000 mg via ORAL

## 2016-04-22 MED ORDER — METOCLOPRAMIDE HCL 10 MG PO TABS
10.0000 mg | ORAL_TABLET | Freq: Once | ORAL | Status: AC
  Administered 2016-04-22: 10 mg via ORAL

## 2016-04-22 MED ORDER — ACETAMINOPHEN 500 MG PO TABS: 1000.0000 mg | ORAL_TABLET | Freq: Four times a day (QID) | ORAL | 0 refills | Status: AC | PRN

## 2016-04-22 MED ORDER — DIPHENHYDRAMINE HCL 25 MG PO CAPS
ORAL_CAPSULE | ORAL | Status: AC
  Filled 2016-04-22: qty 1

## 2016-04-22 MED ORDER — METOCLOPRAMIDE HCL 10 MG PO TABS
ORAL_TABLET | ORAL | Status: AC
  Administered 2016-04-22: 10 mg via ORAL
  Filled 2016-04-22: qty 1

## 2016-04-22 MED ORDER — DIPHENHYDRAMINE HCL 25 MG PO CAPS: 50.0000 mg | ORAL_CAPSULE | Freq: Four times a day (QID) | ORAL | 0 refills | Status: DC | PRN

## 2016-04-22 NOTE — ED Triage Notes (Signed)
Pt to ED c/o left side headache pain x3 days.  States normally doesn't have headaches.  Tried IBU at home with little relief.  Denies n/v/d, cough, congestions, or blurry vision.  Pt presents A&Ox4, chest rise even and unlabored, skin warm and dry.

## 2016-04-22 NOTE — ED Notes (Signed)
Pt discharged home after verbalizing understanding of discharge instructions; nad noted. 

## 2016-04-22 NOTE — ED Provider Notes (Addendum)
[facility] Emergency Department Provider Note  ____________________________________________  Time seen: Approximately 3:53 PM  I have reviewed the triage vital signs and the nursing notes.   HISTORY  Chief Complaint Headache    HPI Alexis Ray is a 60 y.o. female who complains of a gradual onset of left sided frontal headache over the past 3 days. This all started after some manipulation of her left eye performed by her ophthalmologist within the past week including an IM injection and drainage of fluid from the eye. She also has some new eyedrops she's been using this as usual. No new changes in vision. No numbness tingling or weakness or neck pain. No recent illness or fever. Headache feels like throbbing.      Past Medical History:  Diagnosis Date  . CAD (coronary artery disease)   . Chronic combined systolic and diastolic CHF (congestive heart failure) (HCC)   . COPD (chronic obstructive pulmonary disease) (HCC)   . Diabetes mellitus without complication (HCC)   . GERD (gastroesophageal reflux disease)   . Hypertension   . PVD (peripheral vascular disease) (HCC)   . Stroke (cerebrum) [facility](HCC)      Patient Active Problem List   Diagnosis Date Noted  . Chronic systolic heart failure (HCC) 06/07/2015  . Numbness in both legs 06/07/2015  . Tobacco use 06/07/2015  . GERD (gastroesophageal reflux disease) 05/23/2015  . Type 2 diabetes mellitus (HCC) 05/23/2015  . HTN (hypertension) 05/23/2015  . PVD (peripheral vascular disease) (HCC) 05/23/2015  . OSA on CPAP 05/23/2015     Past Surgical History:  Procedure Laterality Date  . ABDOMINAL HYSTERECTOMY    . CARDIAC CATHETERIZATION    . COLONOSCOPY    . EYE SURGERY Bilateral      Prior to Admission medications   Medication Sig Start Date End Date Taking? Authorizing Provider  acetaminophen (TYLENOL) 500 MG tablet Take 2 tablets (1,000 mg total) by mouth every 6 (six) hours as needed.  04/22/16 04/27/16  Sharman CheekPhillip Jannely Henthorn, MD  albuterol (PROVENTIL) (2.5 MG/3ML) 0.083% nebulizer solution Take 3 mLs (2.5 mg total) by nebulization every 6 (six) hours as needed for wheezing or shortness of breath. 05/26/15   Altamese DillingVaibhavkumar Vachhani, MD  albuterol (VENTOLIN HFA) 108 (90 Base) MCG/ACT inhaler Inhale into the lungs every 6 (six) hours as needed for wheezing or shortness of breath.    Historical Provider, MD  aspirin EC 81 MG tablet Take 81 mg by mouth daily.    Historical Provider, MD  budesonide-formoterol (SYMBICORT) 160-4.5 MCG/ACT inhaler Inhale 2 puffs into the lungs 2 (two) times daily.    Historical Provider, MD  carvedilol (COREG) 25 MG tablet Take 12.5 mg by mouth 2 (two) times daily with a meal.    Historical Provider, MD  cilostazol (PLETAL) 100 MG tablet Take 100 mg by mouth 2 (two) times daily.    Historical Provider, MD  diphenhydrAMINE (BENADRYL) 25 mg capsule Take 2 capsules (50 mg total) by mouth every 6 (six) hours as needed. 04/22/16   Sharman CheekPhillip Teodoro Jeffreys, MD  docusate sodium (COLACE) 100 MG capsule Take 100 mg by mouth daily as needed for mild constipation.    Historical Provider, MD  esomeprazole (NEXIUM) 40 MG capsule Take 40 mg by mouth daily at 12 noon.    Historical Provider, MD  furosemide (LASIX) 40 MG tablet Take 1 tablet (40 mg total) by mouth 2 (two) times daily. 07/09/15 07/08/16  Delma Freezeina A Hackney, FNP  HYDROcodone-acetaminophen (NORCO/VICODIN) 5-325 MG tablet Take 1  tablet by mouth every 6 (six) hours as needed for moderate pain. 03/28/16   Tommi Rumps, PA-C  insulin aspart (NOVOLOG) 100 UNIT/ML injection Inject 10 Units into the skin 3 (three) times daily with meals.    Historical Provider, MD  insulin glargine (LANTUS) 100 UNIT/ML injection Inject 40 Units into the skin at bedtime.    Historical Provider, MD  levocetirizine (XYZAL) 5 MG tablet Take 5 mg by mouth at bedtime.     Historical Provider, MD  metFORMIN (GLUCOPHAGE) 1000 MG tablet Take 1,000 mg by mouth 2  (two) times daily with a meal.    Historical Provider, MD  metoCLOPramide (REGLAN) 10 MG tablet Take 1 tablet (10 mg total) by mouth every 6 (six) hours as needed. 04/22/16   Sharman Cheek, MD  Multiple Vitamins-Minerals (SENTRY SENIOR) TABS Take 1 tablet by mouth daily.    Historical Provider, MD  nitroGLYCERIN (NITROSTAT) 0.4 MG SL tablet Place 0.4 mg under the tongue every 5 (five) minutes as needed for chest pain.    Historical Provider, MD  pregabalin (LYRICA) 150 MG capsule Take 150 mg by mouth 3 (three) times daily.     Historical Provider, MD  rosuvastatin (CRESTOR) 40 MG tablet Take 40 mg by mouth daily.    Historical Provider, MD  sacubitril-valsartan (ENTRESTO) 24-26 MG Take 1 tablet by mouth 2 (two) times daily. 07/09/15   Delma Freeze, FNP  sacubitril-valsartan (ENTRESTO) 24-26 MG Take 1 tablet by mouth 2 (two) times daily. 07/12/15   Delma Freeze, FNP  spironolactone (ALDACTONE) 50 MG tablet Take 50 mg by mouth daily.    Historical Provider, MD  tiotropium (SPIRIVA) 18 MCG inhalation capsule Place 18 mcg into inhaler and inhale daily.    Historical Provider, MD  Venlafaxine HCl 225 MG TB24 Take 225 mg by mouth daily.     Historical Provider, MD     Allergies Levaquin [levofloxacin in d5w]   Family History  Problem Relation Age of Onset  . Diabetes Mother   . Cancer Father   . Diabetes Maternal Aunt   . Diabetes Maternal Aunt     Social History Social History  Substance Use Topics  . Smoking status: Current Every Day Smoker    Packs/day: 0.50    Types: Cigarettes  . Smokeless tobacco: Never Used  . Alcohol use No    Review of Systems  Constitutional:   No fever or chills.  ENT:   No sore throat. No rhinorrhea. Cardiovascular:   No chest pain. Respiratory:   No dyspnea or cough. Gastrointestinal:   Negative for abdominal pain, vomiting and diarrhea.  Genitourinary:   Negative for dysuria or difficulty urinating. Musculoskeletal:   Negative for focal pain or  swelling Neurological:   Positive as above for headaches 10-point ROS otherwise negative. ____________________________________________   PHYSICAL EXAM:  VITAL SIGNS: ED Triage Vitals  Enc Vitals Group     BP 04/22/16 1234 135/63     Pulse Rate 04/22/16 1234 80     Resp 04/22/16 1234 16     Temp 04/22/16 1234 97.4 F (36.3 C)     Temp Source 04/22/16 1234 Oral     SpO2 04/22/16 1234 96 %     Weight 04/22/16 1234 191 lb (86.6 kg)     Height 04/22/16 1234 5\' 5"  (1.651 m)     Head Circumference --      Peak Flow --      Pain Score 04/22/16 1235 10  Pain Loc --      Pain Edu? --      Excl. in GC? --     Vital signs reviewed, nursing assessments reviewed.  Constitutional:   Alert and oriented. Well appearing and in no distress. Eyes:   No scleral icterus. No conjunctival pallor. PERRL. EOMI.  No nystagmus.Funduscopy unremarkable without pallor or hemorrhages, sharp optic disc. ENT   Head:   Normocephalic and atraumatic. Nontender temporal arteries.   Nose:   No congestion/rhinnorhea. No septal hematoma   Mouth/Throat:   MMM, no pharyngeal erythema. No peritonsillar mass.    Neck:   No stridor. No SubQ emphysema. No meningismus. No bruit. Hematological/Lymphatic/Immunilogical:   No cervical lymphadenopathy. Cardiovascular:   RRR. Symmetric bilateral radial and DP pulses.  No murmurs.  Respiratory:   Normal respiratory effort without tachypnea nor retractions. Breath sounds are clear and equal bilaterally. No wheezes/rales/rhonchi. Gastrointestinal:   Soft and nontender. Non distended. There is no CVA tenderness.  No rebound, rigidity, or guarding. Genitourinary:   deferred Musculoskeletal:   Nontender with normal range of motion in all extremities. No joint effusions.  No lower extremity tenderness.  No edema. Neurologic:   Normal speech and language.  CN 2-10 normal. Motor grossly intact. No gross focal neurologic deficits are appreciated.  Skin:    Skin is  warm, dry and intact. No rash noted.  No petechiae, purpura, or bullae. ____________________________________________    LABS (pertinent positives/negatives) (all labs ordered are listed, but only abnormal results are displayed) Labs Reviewed - No data to display ____________________________________________   EKG    ____________________________________________    RADIOLOGY    ____________________________________________   PROCEDURES Procedures  ____________________________________________   INITIAL IMPRESSION / ASSESSMENT AND PLAN / ED COURSE  Pertinent labs & imaging results that were available during my care of the patient were reviewed by me and considered in my medical decision making (see chart for details).  Patient presents with nonspecific headache after recent IV intervention. Suspicion setting of new eyedrop medications which are likely vasoactive, I suspect that this is a vascular headache. Low suspicion for stroke intracranial hemorrhage pseudotumor, cancer, meningitis or encephalitis. Low suspicion for temporal arteritis glaucoma or vascular occlusion. Vision is preserved and at baseline. Recommend she follow up with her ophthalmologist for possible medication adjustment. Symptomatic management here.      Clinical Course    ____________________________________________   FINAL CLINICAL IMPRESSION(S) / ED DIAGNOSES  Final diagnoses:  Other headache syndrome      New Prescriptions   ACETAMINOPHEN (TYLENOL) 500 MG TABLET    Take 2 tablets (1,000 mg total) by mouth every 6 (six) hours as needed.   DIPHENHYDRAMINE (BENADRYL) 25 MG CAPSULE    Take 2 capsules (50 mg total) by mouth every 6 (six) hours as needed.   METOCLOPRAMIDE (REGLAN) 10 MG TABLET    Take 1 tablet (10 mg total) by mouth every 6 (six) hours as needed.     Portions of this note were generated with dragon dictation software. Dictation errors may occur despite best attempts at  proofreading.    Sharman CheekPhillip Honesti Seaberg, MD 04/22/16 1554  Headache now is 0 out of 10. We'll discharge home.    Sharman CheekPhillip Thresea Doble, MD 04/22/16 [phone]

## 2016-04-22 NOTE — ED Notes (Signed)
Pt states had fluid removed from around bilateral eyes and pain started 1-2 days after.

## 2016-05-29 ENCOUNTER — Encounter: Payer: Self-pay | Admitting: Emergency Medicine

## 2016-05-29 ENCOUNTER — Emergency Department: Payer: Medicare HMO

## 2016-05-29 ENCOUNTER — Observation Stay
Admission: EM | Admit: 2016-05-29 | Discharge: 2016-05-30 | Disposition: A | Discharge status: Home / Self Care | Payer: Medicare HMO | Attending: Internal Medicine | Admitting: Internal Medicine

## 2016-05-29 DIAGNOSIS — I2510 Atherosclerotic heart disease of native coronary artery without angina pectoris: Secondary | ICD-10-CM | POA: Insufficient documentation

## 2016-05-29 DIAGNOSIS — I5023 Acute on chronic systolic (congestive) heart failure: Secondary | ICD-10-CM | POA: Diagnosis present

## 2016-05-29 DIAGNOSIS — I5043 Acute on chronic combined systolic (congestive) and diastolic (congestive) heart failure: Secondary | ICD-10-CM | POA: Diagnosis not present

## 2016-05-29 DIAGNOSIS — I700 Atherosclerosis of aorta: Secondary | ICD-10-CM | POA: Diagnosis not present

## 2016-05-29 DIAGNOSIS — F17210 Nicotine dependence, cigarettes, uncomplicated: Secondary | ICD-10-CM | POA: Insufficient documentation

## 2016-05-29 DIAGNOSIS — R0602 Shortness of breath: Secondary | ICD-10-CM | POA: Diagnosis present

## 2016-05-29 DIAGNOSIS — E785 Hyperlipidemia, unspecified: Secondary | ICD-10-CM | POA: Insufficient documentation

## 2016-05-29 DIAGNOSIS — Z8673 Personal history of transient ischemic attack (TIA), and cerebral infarction without residual deficits: Secondary | ICD-10-CM | POA: Insufficient documentation

## 2016-05-29 DIAGNOSIS — E1151 Type 2 diabetes mellitus with diabetic peripheral angiopathy without gangrene: Secondary | ICD-10-CM | POA: Diagnosis not present

## 2016-05-29 DIAGNOSIS — I110 Hypertensive heart disease with heart failure: Principal | ICD-10-CM | POA: Insufficient documentation

## 2016-05-29 DIAGNOSIS — J449 Chronic obstructive pulmonary disease, unspecified: Secondary | ICD-10-CM | POA: Diagnosis not present

## 2016-05-29 LAB — CBC
HEMATOCRIT: 37.8 % (ref 35.0–47.0)
HEMOGLOBIN: 12.6 g/dL (ref 12.0–16.0)
MCH: 25.9 pg — ABNORMAL LOW (ref 26.0–34.0)
MCHC: 33.2 g/dL (ref 32.0–36.0)
MCV: 78 fL — AB (ref 80.0–100.0)
Platelets: 131 10*3/uL — ABNORMAL LOW (ref 150–440)
RBC: 4.85 MIL/uL (ref 3.80–5.20)
RDW: 17.8 % — AB (ref 11.5–14.5)
WBC: 7 10*3/uL (ref 3.6–11.0)

## 2016-05-29 LAB — COMPREHENSIVE METABOLIC PANEL
ALK PHOS: 50 U/L (ref 38–126)
ALT: 14 U/L (ref 14–54)
AST: 19 U/L (ref 15–41)
Albumin: 3.4 g/dL — ABNORMAL LOW (ref 3.5–5.0)
Anion gap: 8 (ref 5–15)
BILIRUBIN TOTAL: 0.6 mg/dL (ref 0.3–1.2)
BUN: 18 mg/dL (ref 6–20)
CALCIUM: 7.9 mg/dL — AB (ref 8.9–10.3)
CO2: 27 mmol/L (ref 22–32)
CREATININE: 1.21 mg/dL — AB (ref 0.44–1.00)
Chloride: 101 mmol/L (ref 101–111)
GFR calc Af Amer: 56 mL/min — ABNORMAL LOW (ref >60)
GFR, EST NON AFRICAN AMERICAN: 48 mL/min — AB (ref >60)
GLUCOSE: 278 mg/dL — AB (ref 65–99)
POTASSIUM: 3.7 mmol/L (ref 3.5–5.1)
Sodium: 136 mmol/L (ref 135–145)
TOTAL PROTEIN: 7.3 g/dL (ref 6.5–8.1)

## 2016-05-29 LAB — GLUCOSE, CAPILLARY
Glucose-Capillary: 234 mg/dL — ABNORMAL HIGH (ref 65–99)
Glucose-Capillary: 178 mg/dL — ABNORMAL HIGH (ref 65–99)
Glucose-Capillary: 324 mg/dL — ABNORMAL HIGH (ref 65–99)
Glucose-Capillary: 293 mg/dL — ABNORMAL HIGH (ref 65–99)

## 2016-05-29 LAB — BRAIN NATRIURETIC PEPTIDE: B NATRIURETIC PEPTIDE 5: 164 pg/mL — AB (ref 0.0–100.0)

## 2016-05-29 LAB — TROPONIN I
Troponin I: 0.1 ng/mL (ref <0.03)
Troponin I: 0.1 ng/mL (ref <0.03)
Troponin I: 0.07 ng/mL (ref <0.03)
Troponin I: 0.07 ng/mL (ref <0.03)

## 2016-05-29 LAB — INFLUENZA PANEL BY PCR (TYPE A & B)
Influenza A By PCR: NEGATIVE
Influenza B By PCR: NEGATIVE

## 2016-05-29 LAB — TSH: TSH: 0.877 u[IU]/mL (ref 0.350–4.500)

## 2016-05-29 MED ORDER — CARVEDILOL 6.25 MG PO TABS
12.5000 mg | ORAL_TABLET | Freq: Two times a day (BID) | ORAL | Status: DC
  Administered 2016-05-29 – 2016-05-30 (×3): 12.5 mg via ORAL
  Filled 2016-05-29 (×4): qty 2

## 2016-05-29 MED ORDER — TIOTROPIUM BROMIDE MONOHYDRATE 18 MCG IN CAPS
18.0000 ug | ORAL_CAPSULE | Freq: Every day | RESPIRATORY_TRACT | Status: DC
  Administered 2016-05-29 – 2016-05-30 (×2): 18 ug via RESPIRATORY_TRACT
  Filled 2016-05-29: qty 5

## 2016-05-29 MED ORDER — ASPIRIN EC 81 MG PO TBEC
81.0000 mg | DELAYED_RELEASE_TABLET | Freq: Every day | ORAL | Status: DC
Start: 2016-05-29 — End: 2016-05-30
  Administered 2016-05-29 – 2016-05-30 (×2): 81 mg via ORAL
  Filled 2016-05-29 (×2): qty 1

## 2016-05-29 MED ORDER — CILOSTAZOL 100 MG PO TABS
100.0000 mg | ORAL_TABLET | Freq: Two times a day (BID) | ORAL | Status: DC
  Administered 2016-05-29 – 2016-05-30 (×3): 100 mg via ORAL
  Filled 2016-05-29 (×3): qty 1

## 2016-05-29 MED ORDER — DOCUSATE SODIUM 100 MG PO CAPS: 100.0000 mg | ORAL_CAPSULE | Freq: Every day | ORAL | Status: DC | PRN

## 2016-05-29 MED ORDER — ONDANSETRON HCL 4 MG/2ML IJ SOLN: 4.0000 mg | Freq: Four times a day (QID) | INTRAMUSCULAR | Status: DC | PRN

## 2016-05-29 MED ORDER — ROSUVASTATIN CALCIUM 20 MG PO TABS
40.0000 mg | ORAL_TABLET | Freq: Every day | ORAL | Status: DC
Start: 2016-05-29 — End: 2016-05-30
  Administered 2016-05-29 – 2016-05-30 (×2): 40 mg via ORAL
  Filled 2016-05-29 (×2): qty 2

## 2016-05-29 MED ORDER — FUROSEMIDE 40 MG PO TABS
40.0000 mg | ORAL_TABLET | Freq: Two times a day (BID) | ORAL | Status: DC
  Administered 2016-05-30: 40 mg via ORAL
  Filled 2016-05-29: qty 1

## 2016-05-29 MED ORDER — FUROSEMIDE 10 MG/ML IJ SOLN
INTRAMUSCULAR | Status: AC
  Administered 2016-05-29: 40 mg via INTRAVENOUS
  Filled 2016-05-29: qty 4

## 2016-05-29 MED ORDER — PREGABALIN 75 MG PO CAPS
150.0000 mg | ORAL_CAPSULE | Freq: Three times a day (TID) | ORAL | Status: DC
Start: 2016-05-29 — End: 2016-05-30
  Administered 2016-05-29 – 2016-05-30 (×4): 150 mg via ORAL
  Filled 2016-05-29 (×4): qty 2

## 2016-05-29 MED ORDER — IPRATROPIUM-ALBUTEROL 0.5-2.5 (3) MG/3ML IN SOLN
3.0000 mL | Freq: Once | RESPIRATORY_TRACT | Status: AC
  Administered 2016-05-29: 3 mL via RESPIRATORY_TRACT

## 2016-05-29 MED ORDER — VENLAFAXINE HCL ER 75 MG PO CP24
225.0000 mg | ORAL_CAPSULE | Freq: Every day | ORAL | Status: DC
  Administered 2016-05-29 – 2016-05-30 (×2): 225 mg via ORAL
  Filled 2016-05-29 (×2): qty 3

## 2016-05-29 MED ORDER — METOCLOPRAMIDE HCL 10 MG PO TABS: 10.0000 mg | ORAL_TABLET | Freq: Four times a day (QID) | ORAL | Status: DC | PRN

## 2016-05-29 MED ORDER — ENOXAPARIN SODIUM 40 MG/0.4ML ~~LOC~~ SOLN
40.0000 mg | SUBCUTANEOUS | Status: DC
  Administered 2016-05-29: 40 mg via SUBCUTANEOUS
  Filled 2016-05-29: qty 0.4

## 2016-05-29 MED ORDER — ADULT MULTIVITAMIN W/MINERALS CH
1.0000 | ORAL_TABLET | Freq: Every day | ORAL | Status: DC
  Administered 2016-05-29 – 2016-05-30 (×2): 1 via ORAL
  Filled 2016-05-29 (×2): qty 1

## 2016-05-29 MED ORDER — NICOTINE 21 MG/24HR TD PT24
21.0000 mg | MEDICATED_PATCH | Freq: Every day | TRANSDERMAL | Status: DC
  Administered 2016-05-29 – 2016-05-30 (×2): 21 mg via TRANSDERMAL
  Filled 2016-05-29 (×2): qty 1

## 2016-05-29 MED ORDER — PANTOPRAZOLE SODIUM 40 MG PO TBEC
40.0000 mg | DELAYED_RELEASE_TABLET | Freq: Every day | ORAL | Status: DC
  Administered 2016-05-29 – 2016-05-30 (×2): 40 mg via ORAL
  Filled 2016-05-29 (×2): qty 1

## 2016-05-29 MED ORDER — CETIRIZINE HCL 10 MG PO TABS
10.0000 mg | ORAL_TABLET | Freq: Every day | ORAL | Status: DC
  Administered 2016-05-29 – 2016-05-30 (×2): 10 mg via ORAL
  Filled 2016-05-29 (×3): qty 1

## 2016-05-29 MED ORDER — IPRATROPIUM-ALBUTEROL 0.5-2.5 (3) MG/3ML IN SOLN
RESPIRATORY_TRACT | Status: AC
  Administered 2016-05-29: 3 mL via RESPIRATORY_TRACT
  Filled 2016-05-29: qty 3

## 2016-05-29 MED ORDER — ACETAMINOPHEN 650 MG RE SUPP: 650.0000 mg | Freq: Four times a day (QID) | RECTAL | Status: DC | PRN

## 2016-05-29 MED ORDER — INSULIN ASPART 100 UNIT/ML ~~LOC~~ SOLN
0.0000 [IU] | Freq: Three times a day (TID) | SUBCUTANEOUS | Status: DC
  Administered 2016-05-29: 3 [IU] via SUBCUTANEOUS
  Administered 2016-05-29: 11 [IU] via SUBCUTANEOUS
  Administered 2016-05-29 – 2016-05-30 (×2): 5 [IU] via SUBCUTANEOUS
  Administered 2016-05-30: 8 [IU] via SUBCUTANEOUS
  Filled 2016-05-29: qty 1
  Filled 2016-05-29: qty 5
  Filled 2016-05-29: qty 8
  Filled 2016-05-29: qty 11
  Filled 2016-05-29 (×2): qty 5
  Filled 2016-05-29: qty 3

## 2016-05-29 MED ORDER — NITROGLYCERIN 0.4 MG SL SUBL: 0.4000 mg | SUBLINGUAL_TABLET | SUBLINGUAL | Status: DC | PRN

## 2016-05-29 MED ORDER — ACETAMINOPHEN 325 MG PO TABS
650.0000 mg | ORAL_TABLET | Freq: Four times a day (QID) | ORAL | Status: DC | PRN
  Administered 2016-05-29 – 2016-05-30 (×2): 650 mg via ORAL
  Filled 2016-05-29 (×3): qty 2

## 2016-05-29 MED ORDER — GUAIFENESIN-DM 100-10 MG/5ML PO SYRP
5.0000 mL | ORAL_SOLUTION | ORAL | Status: DC | PRN
  Administered 2016-05-29: 5 mL via ORAL
  Filled 2016-05-29: qty 5

## 2016-05-29 MED ORDER — SPIRONOLACTONE 25 MG PO TABS
50.0000 mg | ORAL_TABLET | Freq: Every day | ORAL | Status: DC
  Administered 2016-05-29 – 2016-05-30 (×2): 50 mg via ORAL
  Filled 2016-05-29 (×2): qty 2

## 2016-05-29 MED ORDER — HYDROCODONE-ACETAMINOPHEN 5-325 MG PO TABS: 1.0000 | ORAL_TABLET | Freq: Four times a day (QID) | ORAL | Status: DC | PRN

## 2016-05-29 MED ORDER — ONDANSETRON HCL 4 MG PO TABS: 4.0000 mg | ORAL_TABLET | Freq: Four times a day (QID) | ORAL | Status: DC | PRN

## 2016-05-29 MED ORDER — DIPHENHYDRAMINE HCL 25 MG PO CAPS: 50.0000 mg | ORAL_CAPSULE | Freq: Four times a day (QID) | ORAL | Status: DC | PRN

## 2016-05-29 MED ORDER — SACUBITRIL-VALSARTAN 24-26 MG PO TABS
1.0000 | ORAL_TABLET | Freq: Two times a day (BID) | ORAL | Status: DC
  Administered 2016-05-29 – 2016-05-30 (×3): 1 via ORAL
  Filled 2016-05-29 (×3): qty 1

## 2016-05-29 MED ORDER — FUROSEMIDE 10 MG/ML IJ SOLN
40.0000 mg | Freq: Once | INTRAMUSCULAR | Status: AC
  Administered 2016-05-29: 40 mg via INTRAVENOUS

## 2016-05-29 MED ORDER — MOMETASONE FURO-FORMOTEROL FUM 200-5 MCG/ACT IN AERO
2.0000 | INHALATION_SPRAY | Freq: Two times a day (BID) | RESPIRATORY_TRACT | Status: DC
  Administered 2016-05-29 – 2016-05-30 (×3): 2 via RESPIRATORY_TRACT
  Filled 2016-05-29: qty 8.8

## 2016-05-29 MED ORDER — SODIUM CHLORIDE 0.9% FLUSH
3.0000 mL | Freq: Two times a day (BID) | INTRAVENOUS | Status: DC
  Administered 2016-05-29 – 2016-05-30 (×3): 3 mL via INTRAVENOUS

## 2016-05-29 MED ORDER — ALBUTEROL SULFATE (2.5 MG/3ML) 0.083% IN NEBU: 2.5000 mg | INHALATION_SOLUTION | RESPIRATORY_TRACT | Status: DC | PRN

## 2016-05-29 MED ORDER — INSULIN GLARGINE 100 UNIT/ML ~~LOC~~ SOLN
24.0000 [IU] | Freq: Every day | SUBCUTANEOUS | Status: DC
  Administered 2016-05-29: 24 [IU] via SUBCUTANEOUS
  Filled 2016-05-29 (×2): qty 0.24

## 2016-05-29 MED ORDER — FUROSEMIDE 10 MG/ML IJ SOLN
40.0000 mg | Freq: Four times a day (QID) | INTRAMUSCULAR | Status: AC
  Administered 2016-05-29 (×2): 40 mg via INTRAVENOUS
  Filled 2016-05-29 (×2): qty 4

## 2016-05-29 MED ORDER — LEVOCETIRIZINE DIHYDROCHLORIDE 5 MG PO TABS: 5.0000 mg | ORAL_TABLET | Freq: Every day | ORAL | Status: DC

## 2016-05-29 NOTE — ED Triage Notes (Addendum)
Pt to triage via w/c with no distress noted; Pt reports since yesterday having nonprod cough, sinus congestion with chills; st HA earlier but none at present; st hx COPD, +smoker

## 2016-05-29 NOTE — Progress Notes (Signed)
[employer] [facility] [place] [facility]   PATIENT NAME: Alexis Ray    MR#:  [id number]  DATE OF BIRTH:  07-15-1955  SUBJECTIVE:  CHIEF COMPLAINT:   Chief Complaint  Patient presents with  . Cough  . Nasal Congestion     Came with C/o chest tightness, Leg edema. She has low ejection fraction and cardiac failure. On further questioning she confirms that she is drinking at least 3 L of total liquids at home. I counseled her about fluid restriction. She feels slightly better after getting Lasix injection here.  REVIEW OF SYSTEMS:  CONSTITUTIONAL: No fever, fatigue or weakness.  EYES: No blurred or double vision.  EARS, NOSE, AND THROAT: No tinnitus or ear pain.  RESPIRATORY: No cough, shortness of breath, wheezing or hemoptysis.  CARDIOVASCULAR: No chest pain, orthopnea, Positive for edema.  GASTROINTESTINAL: No nausea, vomiting, diarrhea or abdominal pain.  GENITOURINARY: No dysuria, hematuria.  ENDOCRINE: No polyuria, nocturia,  HEMATOLOGY: No anemia, easy bruising or bleeding SKIN: No rash or lesion. MUSCULOSKELETAL: No joint pain or arthritis.   NEUROLOGIC: No tingling, numbness, weakness.  PSYCHIATRY: No anxiety or depression.   ROS  DRUG ALLERGIES:   Allergies  Allergen Reactions  . Colchicine     itching  . Sulfa Antibiotics Rash    itching  . Levaquin [Levofloxacin In D5w] Rash    VITALS:  Blood pressure (!) 113/52, pulse 65, temperature 98.4 F (36.9 C), temperature source Oral, resp. rate (!) 26, height 5\' 5"  (1.651 m), weight 84.8 kg (187 lb), SpO2 97 %.  PHYSICAL EXAMINATION:  GENERAL:  61 y.o.-year-old patient lying in the bed with no acute distress.  EYES: Pupils equal, round, reactive to light and accommodation. No scleral icterus. Extraocular muscles intact.  HEENT: Head atraumatic, normocephalic. Oropharynx and nasopharynx clear.  NECK:  Supple, no jugular venous distention. No thyroid enlargement, no tenderness.  LUNGS: Normal breath  sounds bilaterally, no wheezing, Bilateral crepitation. No use of accessory muscles of respiration.  CARDIOVASCULAR: S1, S2 normal. No murmurs, rubs, or gallops.  ABDOMEN: Soft, nontender, nondistended. Bowel sounds present. No organomegaly or mass.  EXTREMITIES: Positive for pedal edema, no cyanosis, or clubbing.  NEUROLOGIC: Cranial nerves II through XII are intact. Muscle strength 5/5 in all extremities. Sensation intact. Gait not checked.  PSYCHIATRIC: The patient is alert and oriented x 3.  SKIN: No obvious rash, lesion, or ulcer.   Physical Exam LABORATORY PANEL:   CBC  Recent Labs Lab 05/29/16 0419  WBC 7.0  HGB 12.6  HCT 37.8  PLT 131*   ------------------------------------------------------------------------------------------------------------------  Chemistries   Recent Labs Lab 05/29/16 0419  NA 136  K 3.7  CL 101  CO2 27  GLUCOSE 278*  BUN 18  CREATININE 1.21*  CALCIUM 7.9*  AST 19  ALT 14  ALKPHOS 50  BILITOT 0.6   ------------------------------------------------------------------------------------------------------------------  Cardiac Enzymes  Recent Labs Lab 05/29/16 0419 05/29/16 1008  TROPONINI 0.10* 0.10*   ------------------------------------------------------------------------------------------------------------------  RADIOLOGY:  Dg Chest 2 View  Result Date: 05/29/2016 CLINICAL DATA:  Initial evaluation for acute cough, chills. EXAM: CHEST  2 VIEW COMPARISON:  Prior radiograph from 05/23/2015. FINDINGS: Mild cardiomegaly is similar to previous. Mediastinal silhouette within normal limits. Aortic atherosclerosis noted. Lungs mildly hypoinflated. Mild diffuse pulmonary vascular congestion with interstitial prominence suggestive of pulmonary interstitial edema. No pleural effusion. No consolidative airspace disease. No pneumothorax. No acute osseous abnormality. IMPRESSION: 1. Mild diffuse pulmonary interstitial edema, suggesting CHF. 2.  Aortic atherosclerosis. Electronically Signed   By:  Rise MuBenjamin  McClintock M.D.   On: 05/29/2016 04:04    ASSESSMENT AND PLAN:   Active Problems:   Acute on chronic systolic CHF (congestive heart failure), NYHA class 4 (HCC)  1. CHF exacerbation: acute on chronic systolic failure, class IV. Last EF 30% (05/24/2015). Continue IV Lasix  2. Essential hypertension: Controlled; continue Lasix and spironolactone for heart failure. Continue Entresto as well.  3. Diabetes mellitus type 2: Continue basal insulin adjusted for hospital diet; sliding scale insulin while hospitalized as well 4. COPD: Continue Spiriva as well as inhaled corticosteroid. Albuterol as needed  5. Tobacco abuse: The patient continues to smoke. I have discussed smoking cessation for 4 min. NicoDerm patch while hospitalized.  6. Peripheral vascular disease: Continue Pletal 7. Hyperlipidemia: Continue statin therapy 8. DVT prophylaxis: Lovenox 9. GI prophylaxis: Pantoprazole per home regimen      All the records are reviewed and case discussed with Care Management/Social Workerr. Management plans discussed with the patient, family and they are in agreement.  CODE STATUS: Full  TOTAL TIME TAKING CARE OF THIS PATIENT: 35 minutes.     POSSIBLE D/C IN 1-2 DAYS, DEPENDING ON CLINICAL CONDITION.   Altamese DillingVACHHANI, Camdin Hegner M.D on 05/29/2016   Between 7am to 6pm - Pager - [phone]  After 6pm go to [email or web address] - password [email or web address]  [facility]  Office  [phone]  CC: Primary care physician; Keane PoliceNOORANI, SEZMIN S, MD  Note: This dictation was prepared with Dragon dictation along with smaller phrase technology. Any transcriptional errors that result from this process are unintentional.

## 2016-05-29 NOTE — H&P (Signed)
Alexis Ray is an 61 y.o. female.   Chief Complaint: Shortness of breath HPI: The patient with past medical history of ischemic myopathy presents emergency department complaining of shortness of breath. The patient states that her dyspnea has progressively worsened over the last 2 days. Today her family members report that the patient has had frothy sputum. The patient denies chest pain, nausea, vomiting or diaphoresis. She admits to some swelling of her legs and difficulty lying flat. Chest x-ray showed mild interstitial edema. Initially the patient's oxygen saturations were greater than 97% on room air as she was sitting upright but laying down to decreased to approximately 90-92% which prompted adding supplemental oxygen via nasal cannula. She was given a dose of Lasix IV after which the emergency department staff called the hospitalist service for admission.  Past Medical History:  Diagnosis Date  . CAD (coronary artery disease)   . Chronic combined systolic and diastolic CHF (congestive heart failure) ([place])   . COPD (chronic obstructive pulmonary disease) ([place])   . Diabetes mellitus without complication ([place])   . GERD (gastroesophageal reflux disease)   . Hypertension   . PVD (peripheral vascular disease) ([place])   . Stroke (cerebrum) [facility])     Past Surgical History:  Procedure Laterality Date  . ABDOMINAL HYSTERECTOMY    . CARDIAC CATHETERIZATION    . COLONOSCOPY    . EYE SURGERY Bilateral     Family History  Problem Relation Age of Onset  . Diabetes Mother   . Cancer Father   . Diabetes Maternal Aunt   . Diabetes Maternal Aunt    Social History:  reports that she has been smoking Cigarettes.  She has been smoking about 0.50 packs per day. She has never used smokeless tobacco. She reports that she does not drink alcohol or use drugs.  Allergies:  Allergies  Allergen Reactions  . Sulfa Antibiotics Rash    itching  . Levaquin [Levofloxacin In D5w] Rash     (Not in a  hospital admission)  Results for orders placed or performed during the hospital encounter of 05/29/16 (from the past 48 hour(s))  Influenza panel by PCR (type A & B)     Status: None   Collection Time: 05/29/16  3:56 AM  Result Value Ref Range   Influenza A By PCR NEGATIVE NEGATIVE   Influenza B By PCR NEGATIVE NEGATIVE    Comment: (NOTE) The Xpert Xpress Flu assay is intended as an aid in the diagnosis of  influenza and should not be used as a sole basis for treatment.  This  assay is FDA approved for nasopharyngeal swab specimens only. Nasal  washings and aspirates are unacceptable for Xpert Xpress Flu testing.   CBC     Status: Abnormal   Collection Time: 05/29/16  4:19 AM  Result Value Ref Range   WBC 7.0 3.6 - 11.0 K/uL   RBC 4.85 3.80 - 5.20 MIL/uL   Hemoglobin 12.6 12.0 - 16.0 g/dL   HCT 37.8 35.0 - 47.0 %   MCV 78.0 (L) 80.0 - 100.0 fL   MCH 25.9 (L) 26.0 - 34.0 pg   MCHC 33.2 32.0 - 36.0 g/dL   RDW 17.8 (H) 11.5 - 14.5 %   Platelets 131 (L) 150 - 440 K/uL  Comprehensive metabolic panel     Status: Abnormal   Collection Time: 05/29/16  4:19 AM  Result Value Ref Range   Sodium 136 135 - 145 mmol/L   Potassium 3.7 3.5 - 5.1  mmol/L   Chloride 101 101 - 111 mmol/L   CO2 27 22 - 32 mmol/L   Glucose, Bld 278 (H) 65 - 99 mg/dL   BUN 18 6 - 20 mg/dL   Creatinine, Ser 2.57 (H) 0.44 - 1.00 mg/dL   Calcium 7.9 (L) 8.9 - 10.3 mg/dL   Total Protein 7.3 6.5 - 8.1 g/dL   Albumin 3.4 (L) 3.5 - 5.0 g/dL   AST 19 15 - 41 U/L   ALT 14 14 - 54 U/L   Alkaline Phosphatase 50 38 - 126 U/L   Total Bilirubin 0.6 0.3 - 1.2 mg/dL   GFR calc non Af Amer 48 (L) >60 mL/min   GFR calc Af Amer 56 (L) >60 mL/min    Comment: (NOTE) The eGFR has been calculated using the CKD EPI equation. This calculation has not been validated in all clinical situations. eGFR's persistently <60 mL/min signify possible Chronic Kidney Disease.    Anion gap 8 5 - 15  Brain natriuretic peptide     Status:  Abnormal   Collection Time: 05/29/16  4:19 AM  Result Value Ref Range   B Natriuretic Peptide 164.0 (H) 0.0 - 100.0 pg/mL  Troponin I     Status: Abnormal   Collection Time: 05/29/16  4:19 AM  Result Value Ref Range   Troponin I 0.10 (HH) <0.03 ng/mL    Comment: CRITICAL RESULT CALLED TO, READ BACK BY AND VERIFIED WITH REBECCA UROCHUK ON 05/29/16 AT 0506 BY TLB    Dg Chest 2 View  Result Date: 05/29/2016 CLINICAL DATA:  Initial evaluation for acute cough, chills. EXAM: CHEST  2 VIEW COMPARISON:  Prior radiograph from 05/23/2015. FINDINGS: Mild cardiomegaly is similar to previous. Mediastinal silhouette within normal limits. Aortic atherosclerosis noted. Lungs mildly hypoinflated. Mild diffuse pulmonary vascular congestion with interstitial prominence suggestive of pulmonary interstitial edema. No pleural effusion. No consolidative airspace disease. No pneumothorax. No acute osseous abnormality. IMPRESSION: 1. Mild diffuse pulmonary interstitial edema, suggesting CHF. 2. Aortic atherosclerosis. Electronically Signed   By: Rise Mu M.D.   On: 05/29/2016 04:04    Review of Systems  Constitutional: Negative for chills and fever.  HENT: Negative for sore throat and tinnitus.   Eyes: Negative for blurred vision and redness.  Respiratory: Positive for cough, sputum production and shortness of breath.   Cardiovascular: Positive for orthopnea. Negative for chest pain, palpitations and PND.  Gastrointestinal: Negative for abdominal pain, diarrhea, nausea and vomiting.  Genitourinary: Negative for dysuria, frequency and urgency.  Musculoskeletal: Negative for joint pain and myalgias.  Skin: Negative for rash.       No lesions  Neurological: Negative for speech change, focal weakness and weakness.  Endo/Heme/Allergies: Does not bruise/bleed easily.       No temperature intolerance  Psychiatric/Behavioral: Negative for depression and suicidal ideas.    Blood pressure 111/65, pulse 81,  temperature 99.9 F (37.7 C), temperature source Oral, resp. rate 17, height 5\' 5"  (1.651 m), weight 84.8 kg (187 lb), SpO2 95 %. Physical Exam  Vitals reviewed. Constitutional: She is oriented to person, place, and time. She appears well-developed and well-nourished.  HENT:  Head: Normocephalic and atraumatic.  Mouth/Throat: Oropharynx is clear and moist.  Eyes: Conjunctivae and EOM are normal. Pupils are equal, round, and reactive to light. No scleral icterus.  Neck: Normal range of motion. Neck supple. No JVD present. No tracheal deviation present. No thyromegaly present.  Cardiovascular: Normal rate and regular rhythm.  Exam reveals distant heart sounds. Exam  reveals no gallop and no friction rub.   No murmur heard. Respiratory: Effort normal. She has wheezes in the right upper field and the left upper field. She has rales in the right lower field and the left lower field.  GI: Soft. Bowel sounds are normal. She exhibits no distension. There is no tenderness.  Genitourinary:  Genitourinary Comments: Deferred  Musculoskeletal: Normal range of motion. She exhibits edema.  Lymphadenopathy:    She has no cervical adenopathy.  Neurological: She is alert and oriented to person, place, and time. No cranial nerve deficit. She exhibits normal muscle tone.  Skin: Skin is warm and dry. No rash noted. No erythema.  Psychiatric: She has a normal mood and affect. Her behavior is normal. Judgment and thought content normal.     Assessment/Plan This is a 61 year old female admitted for acute on chronic congestive heart failure. 1. CHF exacerbation: acute on chronic systolic failure, class IV. Last EF 30% (05/24/2015). Continue IV Lasix today and re-start oral Lasix per home regimen tomorrow. The patient's heart rate is well controlled. I do not think adjusting her beta blocker dose at this time is necessary. 2. Essential hypertension: Controlled; continue Lasix and spironolactone for heart failure.  Continue Entresto as well.  3. Diabetes mellitus type 2: Continue basal insulin adjusted for hospital diet; sliding scale insulin while hospitalized as well 4. COPD: Continue Spiriva as well as inhaled corticosteroid. Albuterol as needed  5. Tobacco abuse: The patient continues to smoke. I have discussed smoking cessation. NicoDerm patch while hospitalized.  6. Peripheral vascular disease: Continue Pletal 7. Hyperlipidemia: Continue statin therapy 8. DVT prophylaxis: Lovenox 9. GI prophylaxis: Pantoprazole per home regimen The patient is a full code. Time spent on admission orders and patient care approximately 45 minutes  Harrie Foreman, MD 05/29/2016, 5:49 AM

## 2016-05-29 NOTE — ED Notes (Signed)
Critical troponin 0.10 MD made aware

## 2016-05-30 LAB — BASIC METABOLIC PANEL
ANION GAP: 7 (ref 5–15)
BUN: 25 mg/dL — ABNORMAL HIGH (ref 6–20)
CALCIUM: 7.8 mg/dL — AB (ref 8.9–10.3)
CO2: 29 mmol/L (ref 22–32)
CREATININE: 1.19 mg/dL — AB (ref 0.44–1.00)
Chloride: 101 mmol/L (ref 101–111)
GFR, EST AFRICAN AMERICAN: 57 mL/min — AB (ref >60)
GFR, EST NON AFRICAN AMERICAN: 49 mL/min — AB (ref >60)
Glucose, Bld: 271 mg/dL — ABNORMAL HIGH (ref 65–99)
Potassium: 3.4 mmol/L — ABNORMAL LOW (ref 3.5–5.1)
SODIUM: 137 mmol/L (ref 135–145)

## 2016-05-30 LAB — HEMOGLOBIN A1C
HEMOGLOBIN A1C: 10.1 % — AB (ref 4.8–5.6)
MEAN PLASMA GLUCOSE: 243 mg/dL

## 2016-05-30 LAB — GLUCOSE, CAPILLARY
Glucose-Capillary: 264 mg/dL — ABNORMAL HIGH (ref 65–99)
Glucose-Capillary: 226 mg/dL — ABNORMAL HIGH (ref 65–99)

## 2016-05-30 MED ORDER — FUROSEMIDE 40 MG PO TABS: 40.0000 mg | ORAL_TABLET | Freq: Two times a day (BID) | ORAL | 0 refills | Status: DC

## 2016-05-30 MED ORDER — POTASSIUM CHLORIDE CRYS ER 20 MEQ PO TBCR
40.0000 meq | EXTENDED_RELEASE_TABLET | Freq: Once | ORAL | Status: AC
  Administered 2016-05-30: 40 meq via ORAL
  Filled 2016-05-30: qty 2

## 2016-05-30 MED ORDER — HYDROCODONE-ACETAMINOPHEN 5-325 MG PO TABS: 1.0000 | ORAL_TABLET | Freq: Four times a day (QID) | ORAL | 0 refills | Status: DC | PRN

## 2016-05-30 MED ORDER — CARVEDILOL 3.125 MG PO TABS: 3.1250 mg | ORAL_TABLET | Freq: Two times a day (BID) | ORAL | 0 refills | Status: DC

## 2016-05-30 NOTE — Discharge Summary (Signed)
[facility] [place] [facility]   PATIENT NAME: Alexis GaribaldiDebra Ray    MR#:  [id number]  DATE OF BIRTH:  July 15, 1963  DATE OF ADMISSION:  05/29/2014 ADMITTING PHYSICIAN: Arnaldo NatalMichael S Diamond, MD  DATE OF DISCHARGE: 05/30/2014  PRIMARY CARE PHYSICIAN: Keane PoliceNOORANI, SEZMIN S, MD    ADMISSION DIAGNOSIS:  sob  DISCHARGE DIAGNOSIS:  Active Problems:   Acute on chronic systolic CHF (congestive heart failure), NYHA class 4 (HCC)   SECONDARY DIAGNOSIS:   Past Medical History:  Diagnosis Date  . CAD (coronary artery disease)   . Chronic combined systolic and diastolic CHF (congestive heart failure) ([facility])   . COPD (chronic obstructive pulmonary disease) ([facility])   . Diabetes mellitus without complication ([facility])   . GERD (gastroesophageal reflux disease)   . Hypertension   . PVD (peripheral vascular disease) ([facility])   . Stroke (cerebrum) [facility](H[facility])     HOSPITAL COURSE:   1. CHF exacerbation: acute on chronic systolic failure,class IV. Last EF 30% (05/24/2015). Continue IV Lasix    Had diuresis > 2 ltr, felt better. D/c home   Councelled about fluid restriction. 2. Essential hypertension: Controlled; continue Lasix and spironolactone for heart failure. Continue Entresto as well.  3. Diabetes mellitus type 2: Continue basal insulin adjusted for hospital diet; sliding scale insulin while hospitalized as well. Stable d/c on home meds. 4. COPD: Continue Spiriva as well as inhaled corticosteroid. Albuterol as needed  5. Tobacco abuse: The patient continues to smoke. I have discussed smoking cessation for 4 min. NicoDerm patch while hospitalized.  6. Peripheral vascular disease: Continue Pletal 7. Hyperlipidemia: Continue statin therapy 8. DVT prophylaxis: Lovenox 9. GI prophylaxis: Pantoprazole per home regimen 10. Htn: BP was on lower side after diuresis. I decreased dose of coreg on discharge./ DISCHARGE CONDITIONS:   Stable.  CONSULTS OBTAINED:    DRUG ALLERGIES:    Allergies  Allergen Reactions  . Colchicine     itching  . Sulfa Antibiotics Rash    itching  . Levaquin [Levofloxacin In D5w] Rash    DISCHARGE MEDICATIONS:   Current Discharge Medication List    CONTINUE these medications which have CHANGED   Details  carvedilol (COREG) 3.125 MG tablet Take 1 tablet (3.125 mg total) by mouth 2 (two) times daily with a meal. Qty: 60 tablet, Refills: 0    furosemide (LASIX) 40 MG tablet Take 1 tablet (40 mg total) by mouth 2 (two) times daily. Qty: 30 tablet, Refills: 0    HYDROcodone-acetaminophen (NORCO/VICODIN) 5-325 MG tablet Take 1 tablet by mouth every 6 (six) hours as needed for moderate pain. Qty: 20 tablet, Refills: 0      CONTINUE these medications which have NOT CHANGED   Details  albuterol (PROVENTIL) (2.5 MG/3ML) 0.083% nebulizer solution Take 3 mLs (2.5 mg total) by nebulization every 6 (six) hours as needed for wheezing or shortness of breath. Qty: 75 mL, Refills: 2    albuterol (VENTOLIN HFA) 108 (90 Base) MCG/ACT inhaler Inhale into the lungs every 6 (six) hours as needed for wheezing or shortness of breath.    aspirin EC 81 MG tablet Take 81 mg by mouth daily.    budesonide-formoterol (SYMBICORT) 160-4.5 MCG/ACT inhaler Inhale 2 puffs into the lungs 2 (two) times daily.    Cholecalciferol 10000 units CAPS Take 1,000 Units by mouth daily.    cilostazol (PLETAL) 100 MG tablet Take 100 mg by mouth 2 (two) times daily.    esomeprazole (NEXIUM) 40 MG capsule Take 40 mg by  mouth daily at 12 noon.    ferrous sulfate 325 (65 FE) MG tablet Take 325 mg by mouth daily with breakfast.    insulin aspart (NOVOLOG) 100 UNIT/ML injection Inject 10 Units into the skin 3 (three) times daily with meals.    insulin glargine (LANTUS) 100 UNIT/ML injection Inject 40 Units into the skin at bedtime.    levocetirizine (XYZAL) 5 MG tablet Take 5 mg by mouth at bedtime.     metFORMIN (GLUCOPHAGE) 1000 MG tablet Take 1,000 mg by mouth 2  (two) times daily with a meal.    Multiple Vitamins-Minerals (SENTRY SENIOR) TABS Take 1 tablet by mouth daily.    nitroGLYCERIN (NITROSTAT) 0.4 MG SL tablet Place 0.4 mg under the tongue every 5 (five) minutes as needed for chest pain.    pregabalin (LYRICA) 150 MG capsule Take 150 mg by mouth 3 (three) times daily.     rosuvastatin (CRESTOR) 40 MG tablet Take 40 mg by mouth daily.    sacubitril-valsartan (ENTRESTO) 24-26 MG Take 1 tablet by mouth 2 (two) times daily. Qty: 60 tablet, Refills: 0    spironolactone (ALDACTONE) 50 MG tablet Take 50 mg by mouth daily.    tiotropium (SPIRIVA) 18 MCG inhalation capsule Place 18 mcg into inhaler and inhale daily.    Venlafaxine HCl 225 MG TB24 Take 225 mg by mouth daily.     vitamin C (ASCORBIC ACID) 500 MG tablet Take 500 mg by mouth daily.      STOP taking these medications     diphenhydrAMINE (BENADRYL) 25 mg capsule      docusate sodium (COLACE) 100 MG capsule      metoCLOPramide (REGLAN) 10 MG tablet          DISCHARGE INSTRUCTIONS:    Follow with Cardiologist clinic in 1-2 weeks.  If you experience worsening of your admission symptoms, develop shortness of breath, life threatening emergency, suicidal or homicidal thoughts you must seek medical attention immediately by calling 911 or calling your MD immediately  if symptoms less severe.  You Must read complete instructions/literature along with all the possible adverse reactions/side effects for all the Medicines you take and that have been prescribed to you. Take any new Medicines after you have completely understood and accept all the possible adverse reactions/side effects.   Please note  You were cared for by a hospitalist during your hospital stay. If you have any questions about your discharge medications or the care you received while you were in the hospital after you are discharged, you can call the unit and asked to speak with the hospitalist on call if the  hospitalist that took care of you is not available. Once you are discharged, your primary care physician will handle any further medical issues. Please note that NO REFILLS for any discharge medications will be authorized once you are discharged, as it is imperative that you return to your primary care physician (or establish a relationship with a primary care physician if you do not have one) for your aftercare needs so that they can reassess your need for medications and monitor your lab values.    Today   CHIEF COMPLAINT:   Chief Complaint  Patient presents with  . Cough  . Nasal Congestion    HISTORY OF PRESENT ILLNESS:  Alexis Ray  is a 62 y.o. female with a known history of ischemic myopathy presents emergency department complaining of shortness of breath. The patient states that her dyspnea has progressively worsened over the  last 2 days. Today her family members report that the patient has had frothy sputum. The patient denies chest pain, nausea, vomiting or diaphoresis. She admits to some swelling of her legs and difficulty lying flat. Chest x-ray showed mild interstitial edema. Initially the patient's oxygen saturations were greater than 97% on room air as she was sitting upright but laying down to decreased to approximately 90-92% which prompted adding supplemental oxygen via nasal cannula. She was given a dose of Lasix IV after which the emergency department staff called the hospitalist service for admission.   VITAL SIGNS:  Blood pressure (!) 80/42, pulse 69, temperature 97.6 F (36.4 C), temperature source Oral, resp. rate (!) 21, height 5\' 5"  (1.651 m), weight 84.8 kg (187 lb), SpO2 95 %.  I/O:   Intake/Output Summary (Last 24 hours) at 05/30/16 1508 Last data filed at 05/30/16 1358  Gross per 24 hour  Intake              240 ml  Output             2750 ml  Net            -2510 ml    PHYSICAL EXAMINATION:   GENERAL:  61 y.o.-year-old patient lying in the bed with no  acute distress.  EYES: Pupils equal, round, reactive to light and accommodation. No scleral icterus. Extraocular muscles intact.  HEENT: Head atraumatic, normocephalic. Oropharynx and nasopharynx clear.  NECK:  Supple, no jugular venous distention. No thyroid enlargement, no tenderness.  LUNGS: Normal breath sounds bilaterally, no wheezing, Bilateral crepitation. No use of accessory muscles of respiration.  CARDIOVASCULAR: S1, S2 normal. No murmurs, rubs, or gallops.  ABDOMEN: Soft, nontender, nondistended. Bowel sounds present. No organomegaly or mass.  EXTREMITIES: Positive for pedal edema, no cyanosis, or clubbing.  NEUROLOGIC: Cranial nerves II through XII are intact. Muscle strength 5/5 in all extremities. Sensation intact. Gait not checked.  PSYCHIATRIC: The patient is alert and oriented x 3.  SKIN: No obvious rash, lesion, or ulcer.   DATA REVIEW:   CBC  Recent Labs Lab 05/29/16 0419  WBC 7.0  HGB 12.6  HCT 37.8  PLT 131*    Chemistries   Recent Labs Lab 05/29/16 0419 05/30/16 0436  NA 136 137  K 3.7 3.4*  CL 101 101  CO2 27 29  GLUCOSE 278* 271*  BUN 18 25*  CREATININE 1.21* 1.19*  CALCIUM 7.9* 7.8*  AST 19  --   ALT 14  --   ALKPHOS 50  --   BILITOT 0.6  --     Cardiac Enzymes  Recent Labs Lab 05/29/16 2211  TROPONINI 0.07*    Microbiology Results  Results for orders placed or performed during the hospital encounter of 05/23/15  Culture, expectorated sputum-assessment     Status: None   Collection Time: 05/24/15  5:32 PM  Result Value Ref Range Status   Specimen Description EXPECTORATED SPUTUM  Final   Special Requests NONE  Final   Sputum evaluation THIS SPECIMEN IS ACCEPTABLE FOR SPUTUM CULTURE  Final   Report Status 05/24/2015 FINAL  Final  Culture, respiratory (NON-Expectorated)     Status: None   Collection Time: 05/24/15  5:32 PM  Result Value Ref Range Status   Specimen Description EXPECTORATED SPUTUM  Final   Special Requests NONE  Reflexed from [id number]  Final   Gram Stain   Final    FAIR SPECIMEN - 70-80% WBCS FEW WBC SEEN MANY GRAM POSITIVE COCCI RARE  GRAM NEGATIVE RODS    Culture Consistent with normal respiratory flora.  Final   Report Status 05/27/2015 FINAL  Final    RADIOLOGY:  Dg Chest 2 View  Result Date: 05/29/2016 CLINICAL DATA:  Initial evaluation for acute cough, chills. EXAM: CHEST  2 VIEW COMPARISON:  Prior radiograph from 05/23/2015. FINDINGS: Mild cardiomegaly is similar to previous. Mediastinal silhouette within normal limits. Aortic atherosclerosis noted. Lungs mildly hypoinflated. Mild diffuse pulmonary vascular congestion with interstitial prominence suggestive of pulmonary interstitial edema. No pleural effusion. No consolidative airspace disease. No pneumothorax. No acute osseous abnormality. IMPRESSION: 1. Mild diffuse pulmonary interstitial edema, suggesting CHF. 2. Aortic atherosclerosis. Electronically Signed   By: Rise Mu M.D.   On: 05/29/2016 04:04    EKG:   Orders placed or performed during the hospital encounter of 05/23/15  . ED EKG within 10 minutes  . ED EKG within 10 minutes  . EKG      Management plans discussed with the patient, family and they are in agreement.  CODE STATUS:     Code Status Orders        Start     Ordered   05/29/16 0644  Full code  Continuous     05/29/16 0643    Code Status History    Date Active Date Inactive Code Status Order ID Comments User Context   05/23/2015 10:56 PM 05/26/2015  7:47 PM Full Code [id number]  Oralia Manis, MD Inpatient      TOTAL TIME TAKING CARE OF THIS PATIENT: 35 minutes.    Altamese Dilling M.D on 05/30/2016 at 3:08 PM  Between 7am to 6pm - Pager - [phone]  After 6pm go to [email or web address] - password [email or web address]  [facility]  Office  [phone]  CC: Primary care physician; Keane Police, MD   Note: This dictation was prepared with Dragon dictation along with  smaller phrase technology. Any transcriptional errors that result from this process are unintentional.

## 2016-05-30 NOTE — Progress Notes (Signed)
Appointment at the [facility] Clinic scheduled for June 05, 2016 @ 11:00am. Thank you

## 2016-05-30 NOTE — Progress Notes (Signed)
Patient discharged to home. Prescription/ discharge summary given. IV site removed.

## 2016-05-30 NOTE — Care Management Obs Status (Signed)
MEDICARE OBSERVATION STATUS NOTIFICATION   Patient Details  Name: Alexis Ray MRN: [id number] Date of Birth: [birth year]   Medicare Observation Status Notification Given:  Yes    Chapman FitchBOWEN, Mychael Smock T, RN 05/30/2016, 4:25 PM

## 2016-05-30 NOTE — Progress Notes (Signed)
Inpatient Diabetes Program Recommendations  AACE/ADA: New Consensus Statement on Inpatient Glycemic Control (2015)  Target Ranges:  Prepandial:   less than 140 mg/dL      Peak postprandial:   less than 180 mg/dL (1-2 hours)      Critically ill patients:  140 - 180 mg/dL   Lab Results  Component Value Date   GLUCAP 264 (H) 05/30/2016   HGBA1C 10.1 (H) 05/29/2016    Review of Glycemic Control  Results for [name], Alexis W (MRN [id number]) as of 05/30/2016 08:48  Ref. Range 05/29/2016 07:45 05/29/2016 11:25 05/29/2016 18:39 05/29/2016 21:13 05/30/2016 07:34  Glucose-Capillary Latest Ref Range: 65 - 99 mg/dL 409234 (H) 811178 (H) 914324 (H) 293 (H) 264 (H)    Diabetes history: Type 2 Outpatient Diabetes medications: Novolog 3-10 units tid, Lantus 40 units qhs, Metformin 500mg  bid, Victoza 1.8mg   Current orders for Inpatient glycemic control: Lantus 24 units qhs, Novolog 0-15 units tid  Inpatient Diabetes Program Recommendations:     Please consider increasing Lantus to 40 units qhs, add Novolog 5 units tid with meals and add Novolog 0-5 units qhs.  Spoke to patient at her bedside this am.  Reports she takes Lantus 40 units, Novolog 3-10 units tid, Metformin 500mg  bid and Victoza 1.8mg .   Susette RacerJulie Alexie Samson, RN, BA, MHA, CDE Diabetes Coordinator Inpatient Diabetes Program  [phone] (Team Pager) [phone] [facility] Office) 05/30/2016 8:51 AM

## 2016-05-30 NOTE — Discharge Instructions (Signed)
Heart Failure Clinic appointment on June 05, 2016 at 11:00am with Tina Hackney, FNP. Please call [phone] to reschedule.  °

## 2016-06-05 ENCOUNTER — Emergency Department: Payer: Medicare HMO

## 2016-06-05 ENCOUNTER — Inpatient Hospital Stay
Admission: EM | Admit: 2016-06-05 | Discharge: 2016-06-09 | DRG: 193 | Disposition: A | Discharge status: Home / Self Care | Payer: Medicare HMO | Attending: Internal Medicine | Admitting: Internal Medicine

## 2016-06-05 ENCOUNTER — Telehealth: Payer: Self-pay | Admitting: Family

## 2016-06-05 ENCOUNTER — Ambulatory Visit: Payer: Medicare HMO | Admitting: Family

## 2016-06-05 ENCOUNTER — Encounter: Payer: Self-pay | Admitting: *Deleted

## 2016-06-05 DIAGNOSIS — J441 Chronic obstructive pulmonary disease with (acute) exacerbation: Secondary | ICD-10-CM | POA: Diagnosis present

## 2016-06-05 DIAGNOSIS — I5043 Acute on chronic combined systolic (congestive) and diastolic (congestive) heart failure: Secondary | ICD-10-CM | POA: Diagnosis present

## 2016-06-05 DIAGNOSIS — T380X5A Adverse effect of glucocorticoids and synthetic analogues, initial encounter: Secondary | ICD-10-CM | POA: Diagnosis present

## 2016-06-05 DIAGNOSIS — I248 Other forms of acute ischemic heart disease: Secondary | ICD-10-CM | POA: Diagnosis present

## 2016-06-05 DIAGNOSIS — Y95 Nosocomial condition: Secondary | ICD-10-CM | POA: Diagnosis present

## 2016-06-05 DIAGNOSIS — J440 Chronic obstructive pulmonary disease with acute lower respiratory infection: Secondary | ICD-10-CM | POA: Diagnosis present

## 2016-06-05 DIAGNOSIS — Z881 Allergy status to other antibiotic agents status: Secondary | ICD-10-CM

## 2016-06-05 DIAGNOSIS — Z888 Allergy status to other drugs, medicaments and biological substances status: Secondary | ICD-10-CM

## 2016-06-05 DIAGNOSIS — Z809 Family history of malignant neoplasm, unspecified: Secondary | ICD-10-CM

## 2016-06-05 DIAGNOSIS — I5023 Acute on chronic systolic (congestive) heart failure: Secondary | ICD-10-CM

## 2016-06-05 DIAGNOSIS — R2681 Unsteadiness on feet: Secondary | ICD-10-CM

## 2016-06-05 DIAGNOSIS — J189 Pneumonia, unspecified organism: Secondary | ICD-10-CM | POA: Diagnosis present

## 2016-06-05 DIAGNOSIS — Z8673 Personal history of transient ischemic attack (TIA), and cerebral infarction without residual deficits: Secondary | ICD-10-CM

## 2016-06-05 DIAGNOSIS — Z7982 Long term (current) use of aspirin: Secondary | ICD-10-CM | POA: Diagnosis not present

## 2016-06-05 DIAGNOSIS — Z833 Family history of diabetes mellitus: Secondary | ICD-10-CM

## 2016-06-05 DIAGNOSIS — Z90710 Acquired absence of both cervix and uterus: Secondary | ICD-10-CM | POA: Diagnosis not present

## 2016-06-05 DIAGNOSIS — J9620 Acute and chronic respiratory failure, unspecified whether with hypoxia or hypercapnia: Secondary | ICD-10-CM | POA: Diagnosis present

## 2016-06-05 DIAGNOSIS — Z794 Long term (current) use of insulin: Secondary | ICD-10-CM | POA: Diagnosis not present

## 2016-06-05 DIAGNOSIS — Z882 Allergy status to sulfonamides status: Secondary | ICD-10-CM | POA: Diagnosis not present

## 2016-06-05 DIAGNOSIS — F17210 Nicotine dependence, cigarettes, uncomplicated: Secondary | ICD-10-CM | POA: Diagnosis present

## 2016-06-05 DIAGNOSIS — I110 Hypertensive heart disease with heart failure: Secondary | ICD-10-CM | POA: Diagnosis present

## 2016-06-05 DIAGNOSIS — G4733 Obstructive sleep apnea (adult) (pediatric): Secondary | ICD-10-CM | POA: Diagnosis present

## 2016-06-05 DIAGNOSIS — Z9981 Dependence on supplemental oxygen: Secondary | ICD-10-CM | POA: Diagnosis not present

## 2016-06-05 DIAGNOSIS — Z7951 Long term (current) use of inhaled steroids: Secondary | ICD-10-CM

## 2016-06-05 DIAGNOSIS — I2510 Atherosclerotic heart disease of native coronary artery without angina pectoris: Secondary | ICD-10-CM | POA: Diagnosis present

## 2016-06-05 DIAGNOSIS — B373 Candidiasis of vulva and vagina: Secondary | ICD-10-CM | POA: Diagnosis present

## 2016-06-05 DIAGNOSIS — R0602 Shortness of breath: Secondary | ICD-10-CM | POA: Diagnosis present

## 2016-06-05 DIAGNOSIS — E1151 Type 2 diabetes mellitus with diabetic peripheral angiopathy without gangrene: Secondary | ICD-10-CM | POA: Diagnosis present

## 2016-06-05 DIAGNOSIS — K219 Gastro-esophageal reflux disease without esophagitis: Secondary | ICD-10-CM | POA: Diagnosis present

## 2016-06-05 LAB — TROPONIN I: Troponin I: 0.07 ng/mL (ref <0.03)

## 2016-06-05 LAB — BASIC METABOLIC PANEL
Anion gap: 9 (ref 5–15)
BUN: 16 mg/dL (ref 6–20)
CHLORIDE: 98 mmol/L — AB (ref 101–111)
CO2: 27 mmol/L (ref 22–32)
CREATININE: 0.94 mg/dL (ref 0.44–1.00)
Calcium: 9 mg/dL (ref 8.9–10.3)
GFR calc Af Amer: >60 mL/min (ref >60)
GFR calc non Af Amer: >60 mL/min (ref >60)
Glucose, Bld: 390 mg/dL — ABNORMAL HIGH (ref 65–99)
POTASSIUM: 3.5 mmol/L (ref 3.5–5.1)
SODIUM: 134 mmol/L — AB (ref 135–145)

## 2016-06-05 LAB — CBC
HEMATOCRIT: 41.6 % (ref 35.0–47.0)
HEMOGLOBIN: 13.7 g/dL (ref 12.0–16.0)
MCH: 25.4 pg — AB (ref 26.0–34.0)
MCHC: 32.8 g/dL (ref 32.0–36.0)
MCV: 77.6 fL — AB (ref 80.0–100.0)
PLATELETS: 239 10*3/uL (ref 150–440)
RBC: 5.37 MIL/uL — AB (ref 3.80–5.20)
RDW: 17.8 % — ABNORMAL HIGH (ref 11.5–14.5)
WBC: 7.6 10*3/uL (ref 3.6–11.0)

## 2016-06-05 LAB — INFLUENZA PANEL BY PCR (TYPE A & B)
INFLAPCR: NEGATIVE
INFLBPCR: NEGATIVE

## 2016-06-05 MED ORDER — CARVEDILOL 3.125 MG PO TABS
3.1250 mg | ORAL_TABLET | Freq: Two times a day (BID) | ORAL | Status: DC
  Administered 2016-06-06 – 2016-06-09 (×6): 3.125 mg via ORAL
  Filled 2016-06-05 (×6): qty 1

## 2016-06-05 MED ORDER — ACETAMINOPHEN 325 MG PO TABS: 650.0000 mg | ORAL_TABLET | Freq: Four times a day (QID) | ORAL | Status: DC | PRN

## 2016-06-05 MED ORDER — VENLAFAXINE HCL ER 225 MG PO TB24: 225.0000 mg | ORAL_TABLET | Freq: Every day | ORAL | Status: DC

## 2016-06-05 MED ORDER — DEXTROSE 5 % IV SOLN: 1.0000 g | Freq: Three times a day (TID) | INTRAVENOUS | Status: DC

## 2016-06-05 MED ORDER — ALBUTEROL SULFATE (2.5 MG/3ML) 0.083% IN NEBU: 2.5000 mg | INHALATION_SOLUTION | Freq: Four times a day (QID) | RESPIRATORY_TRACT | Status: DC | PRN

## 2016-06-05 MED ORDER — LEVOCETIRIZINE DIHYDROCHLORIDE 5 MG PO TABS: 5.0000 mg | ORAL_TABLET | Freq: Every day | ORAL | Status: DC

## 2016-06-05 MED ORDER — SACUBITRIL-VALSARTAN 24-26 MG PO TABS
1.0000 | ORAL_TABLET | Freq: Two times a day (BID) | ORAL | Status: DC
  Administered 2016-06-06 – 2016-06-07 (×5): 1 via ORAL
  Filled 2016-06-05 (×5): qty 1

## 2016-06-05 MED ORDER — CEFEPIME-DEXTROSE 2 GM/50ML IV SOLR
2.0000 g | Freq: Once | INTRAVENOUS | Status: AC
  Administered 2016-06-05: 2 g via INTRAVENOUS
  Filled 2016-06-05: qty 50

## 2016-06-05 MED ORDER — INSULIN ASPART 100 UNIT/ML ~~LOC~~ SOLN
0.0000 [IU] | Freq: Three times a day (TID) | SUBCUTANEOUS | Status: DC
  Administered 2016-06-06: 5 [IU] via SUBCUTANEOUS
  Administered 2016-06-06: 11 [IU] via SUBCUTANEOUS
  Administered 2016-06-06: 8 [IU] via SUBCUTANEOUS
  Administered 2016-06-07: 11 [IU] via SUBCUTANEOUS
  Administered 2016-06-07 (×2): 15 [IU] via SUBCUTANEOUS
  Administered 2016-06-08: 3 [IU] via SUBCUTANEOUS
  Administered 2016-06-08: 8 [IU] via SUBCUTANEOUS
  Administered 2016-06-08: 15 [IU] via SUBCUTANEOUS
  Administered 2016-06-09: 8 [IU] via SUBCUTANEOUS
  Administered 2016-06-09: 3 [IU] via SUBCUTANEOUS
  Filled 2016-06-05: qty 8
  Filled 2016-06-05: qty 11
  Filled 2016-06-05: qty 15
  Filled 2016-06-05: qty 3
  Filled 2016-06-05 (×2): qty 15
  Filled 2016-06-05: qty 8
  Filled 2016-06-05: qty 5
  Filled 2016-06-05: qty 8
  Filled 2016-06-05: qty 11
  Filled 2016-06-05: qty 3

## 2016-06-05 MED ORDER — NITROGLYCERIN 0.4 MG SL SUBL: 0.4000 mg | SUBLINGUAL_TABLET | SUBLINGUAL | Status: DC | PRN

## 2016-06-05 MED ORDER — CILOSTAZOL 100 MG PO TABS
100.0000 mg | ORAL_TABLET | Freq: Two times a day (BID) | ORAL | Status: DC
  Administered 2016-06-06 – 2016-06-09 (×8): 100 mg via ORAL
  Filled 2016-06-05 (×8): qty 1

## 2016-06-05 MED ORDER — PREGABALIN 75 MG PO CAPS
150.0000 mg | ORAL_CAPSULE | Freq: Three times a day (TID) | ORAL | Status: DC
  Administered 2016-06-06 – 2016-06-07 (×5): 150 mg via ORAL
  Filled 2016-06-05 (×6): qty 2

## 2016-06-05 MED ORDER — HYDROCODONE-ACETAMINOPHEN 5-325 MG PO TABS
1.0000 | ORAL_TABLET | Freq: Four times a day (QID) | ORAL | Status: DC | PRN
  Administered 2016-06-06: 1 via ORAL
  Filled 2016-06-05: qty 1

## 2016-06-05 MED ORDER — ROSUVASTATIN CALCIUM 10 MG PO TABS
40.0000 mg | ORAL_TABLET | Freq: Every day | ORAL | Status: DC
Start: 2016-06-06 — End: 2016-06-09
  Administered 2016-06-06 – 2016-06-09 (×4): 40 mg via ORAL
  Filled 2016-06-05 (×4): qty 4

## 2016-06-05 MED ORDER — PANTOPRAZOLE SODIUM 40 MG PO TBEC
40.0000 mg | DELAYED_RELEASE_TABLET | Freq: Every day | ORAL | Status: DC
  Administered 2016-06-06 – 2016-06-09 (×4): 40 mg via ORAL
  Filled 2016-06-05 (×4): qty 1

## 2016-06-05 MED ORDER — TIOTROPIUM BROMIDE MONOHYDRATE 18 MCG IN CAPS
18.0000 ug | ORAL_CAPSULE | Freq: Every day | RESPIRATORY_TRACT | Status: DC
  Administered 2016-06-06 – 2016-06-09 (×4): 18 ug via RESPIRATORY_TRACT
  Filled 2016-06-05: qty 5

## 2016-06-05 MED ORDER — VITAMIN C 500 MG PO TABS
500.0000 mg | ORAL_TABLET | Freq: Every day | ORAL | Status: DC
  Administered 2016-06-06 – 2016-06-09 (×3): 500 mg via ORAL
  Filled 2016-06-05 (×5): qty 1

## 2016-06-05 MED ORDER — ENOXAPARIN SODIUM 40 MG/0.4ML ~~LOC~~ SOLN
40.0000 mg | SUBCUTANEOUS | Status: DC
  Administered 2016-06-06 – 2016-06-09 (×4): 40 mg via SUBCUTANEOUS
  Filled 2016-06-05 (×4): qty 0.4

## 2016-06-05 MED ORDER — DEXTROSE 5 % IV SOLN: 2.0000 g | Freq: Once | INTRAVENOUS | Status: DC

## 2016-06-05 MED ORDER — FERROUS SULFATE 325 (65 FE) MG PO TABS
325.0000 mg | ORAL_TABLET | Freq: Every day | ORAL | Status: DC
  Administered 2016-06-06 – 2016-06-09 (×4): 325 mg via ORAL
  Filled 2016-06-05 (×4): qty 1

## 2016-06-05 MED ORDER — INSULIN GLARGINE 100 UNIT/ML ~~LOC~~ SOLN
40.0000 [IU] | Freq: Every day | SUBCUTANEOUS | Status: DC
  Administered 2016-06-06: 40 [IU] via SUBCUTANEOUS
  Filled 2016-06-05 (×2): qty 0.4

## 2016-06-05 MED ORDER — SPIRONOLACTONE 25 MG PO TABS
50.0000 mg | ORAL_TABLET | Freq: Every day | ORAL | Status: DC
  Administered 2016-06-06 – 2016-06-07 (×2): 50 mg via ORAL
  Filled 2016-06-05 (×2): qty 2

## 2016-06-05 MED ORDER — MOMETASONE FURO-FORMOTEROL FUM 200-5 MCG/ACT IN AERO
2.0000 | INHALATION_SPRAY | Freq: Two times a day (BID) | RESPIRATORY_TRACT | Status: DC
  Administered 2016-06-06 – 2016-06-09 (×8): 2 via RESPIRATORY_TRACT
  Filled 2016-06-05: qty 8.8

## 2016-06-05 MED ORDER — VITAMIN D 1000 UNITS PO TABS
1000.0000 [IU] | ORAL_TABLET | Freq: Every day | ORAL | Status: DC
  Administered 2016-06-06 – 2016-06-09 (×4): 1000 [IU] via ORAL
  Filled 2016-06-05 (×4): qty 1

## 2016-06-05 MED ORDER — INSULIN ASPART 100 UNIT/ML ~~LOC~~ SOLN
0.0000 [IU] | Freq: Every day | SUBCUTANEOUS | Status: DC
  Administered 2016-06-06: 5 [IU] via SUBCUTANEOUS
  Administered 2016-06-06: 3 [IU] via SUBCUTANEOUS
  Administered 2016-06-07 – 2016-06-08 (×2): 5 [IU] via SUBCUTANEOUS
  Filled 2016-06-05 (×2): qty 5
  Filled 2016-06-05: qty 4
  Filled 2016-06-05: qty 2

## 2016-06-05 MED ORDER — ADULT MULTIVITAMIN W/MINERALS CH
1.0000 | ORAL_TABLET | Freq: Every day | ORAL | Status: DC
  Administered 2016-06-06: 1 via ORAL
  Filled 2016-06-05: qty 1

## 2016-06-05 MED ORDER — VANCOMYCIN HCL IN DEXTROSE 750-5 MG/150ML-% IV SOLN
750.0000 mg | Freq: Two times a day (BID) | INTRAVENOUS | Status: DC
  Administered 2016-06-06: 750 mg via INTRAVENOUS
  Filled 2016-06-05 (×2): qty 150

## 2016-06-05 MED ORDER — FUROSEMIDE 40 MG PO TABS
40.0000 mg | ORAL_TABLET | Freq: Two times a day (BID) | ORAL | Status: DC
  Filled 2016-06-05: qty 1

## 2016-06-05 MED ORDER — VANCOMYCIN HCL IN DEXTROSE 1-5 GM/200ML-% IV SOLN
1000.0000 mg | Freq: Once | INTRAVENOUS | Status: AC
  Administered 2016-06-06: 1000 mg via INTRAVENOUS
  Filled 2016-06-05: qty 200

## 2016-06-05 MED ORDER — CEFEPIME-DEXTROSE 1 GM/50ML IV SOLR
1.0000 g | Freq: Three times a day (TID) | INTRAVENOUS | Status: DC
  Administered 2016-06-06 – 2016-06-09 (×10): 1 g via INTRAVENOUS
  Filled 2016-06-05 (×13): qty 50

## 2016-06-05 MED ORDER — ASPIRIN EC 81 MG PO TBEC
81.0000 mg | DELAYED_RELEASE_TABLET | Freq: Every day | ORAL | Status: DC
Start: 2016-06-06 — End: 2016-06-09
  Administered 2016-06-06 – 2016-06-09 (×4): 81 mg via ORAL
  Filled 2016-06-05 (×4): qty 1

## 2016-06-05 NOTE — H&P (Signed)
History and Physical   [facility] [place] @ [facility] Admission History and Physical [employer], D.O.    Patient Name: Alexis Ray MR#: [id number] Date of Birth: 02-Jun-1955 Date of Admission: 06/05/2016  Referring MD/NP/PA: Dr. Pershing Proud Primary Care Physician: Keane Police, MD Outpatient Specialists: Dr. Ala Dach Patient coming from: Home  Chief Complaint: SOB  HPI: Alexis Ray is a 61 y.o. female with a known history of CAD, home O2 dependent COPD, DM, chronic combined systolic and diastolic CHF was in a usual state of health until one day prior to arrival in the emergency department when she describes cough productive of yellow sputum associated with worsening shortness of breath and chest pressure associated with the cough. Her home health nurse found her with an O2 sat of 80% on 3 L O2 via nasal cannula.   Of note patient was hospitalized on 05/29/2016 with CHF exacerbation. She was discharged home one day later.  She states that she never returned to her baseline.  She has been progressively worsening since she got home.   Otherwise there has been no change in status. Patient has been taking medication as prescribed and there has been no recent change in medication or diet.  No recent antibiotics.  There has been no recent illness, hospitalizations, travel or sick contacts.    Patient denies fevers/chills, weakness, dizziness,  N/V/C/D, abdominal pain, dysuria/frequency, changes in mental status.   ED Course: Patient rec'd Vanco, Cefepime  Review of Systems:  CONSTITUTIONAL: No fever/chills, fatigue, weakness, weight gain/loss, headache. EYES: No blurry or double vision. ENT: No tinnitus, postnasal drip, redness or soreness of the oropharynx. RESPIRATORY: Positive cough, dyspnea, wheeze.  No hemoptysis.  CARDIOVASCULAR: Positive chest pain, Negativepalpitations, syncope, orthopnea. No lower extremity edema.  GASTROINTESTINAL: No nausea, vomiting, abdominal pain,  diarrhea, constipation.  No hematemesis, melena or hematochezia. GENITOURINARY: No dysuria, frequency, hematuria. ENDOCRINE: No polyuria or nocturia. No heat or cold intolerance. HEMATOLOGY: No anemia, bruising, bleeding. INTEGUMENTARY: No rashes, ulcers, lesions. MUSCULOSKELETAL: No arthritis, gout, dyspnea. NEUROLOGIC: No numbness, tingling, ataxia, seizure-type activity, weakness. PSYCHIATRIC: No anxiety, depression, insomnia.   Past Medical History:  Diagnosis Date  . CAD (coronary artery disease)   . Chronic combined systolic and diastolic CHF (congestive heart failure) (HCC)   . COPD (chronic obstructive pulmonary disease) (HCC)   . Diabetes mellitus without complication (HCC)   . GERD (gastroesophageal reflux disease)   . Hypertension   . PVD (peripheral vascular disease) (HCC)   . Stroke (cerebrum) [facility])     Past Surgical History:  Procedure Laterality Date  . ABDOMINAL HYSTERECTOMY    . CARDIAC CATHETERIZATION    . COLONOSCOPY    . EYE SURGERY Bilateral      reports that she has been smoking Cigarettes.  She has been smoking about 0.00 packs per day. She has never used smokeless tobacco. She reports that she does not drink alcohol or use drugs.  Allergies  Allergen Reactions  . Colchicine     itching  . Sulfa Antibiotics Rash    itching  . Levaquin [Levofloxacin In D5w] Rash    Family History  Problem Relation Age of Onset  . Diabetes Mother   . Cancer Father   . Diabetes Maternal Aunt   . Diabetes Maternal Aunt    Family history has been reviewed and confirmed with patient.   Prior to Admission medications   Medication Sig Start Date End Date Taking? Authorizing Provider  albuterol (PROVENTIL) (2.5 MG/3ML) 0.083% nebulizer solution Take  3 mLs (2.5 mg total) by nebulization every 6 (six) hours as needed for wheezing or shortness of breath. 05/26/15   Altamese Dilling, MD  albuterol (VENTOLIN HFA) 108 (90 Base) MCG/ACT inhaler Inhale into the lungs  every 6 (six) hours as needed for wheezing or shortness of breath.    Historical Provider, MD  aspirin EC 81 MG tablet Take 81 mg by mouth daily.    Historical Provider, MD  budesonide-formoterol (SYMBICORT) 160-4.5 MCG/ACT inhaler Inhale 2 puffs into the lungs 2 (two) times daily.    Historical Provider, MD  carvedilol (COREG) 3.125 MG tablet Take 1 tablet (3.125 mg total) by mouth 2 (two) times daily with a meal. 05/30/16   Altamese Dilling, MD  Cholecalciferol 10000 units CAPS Take 1,000 Units by mouth daily.    Historical Provider, MD  cilostazol (PLETAL) 100 MG tablet Take 100 mg by mouth 2 (two) times daily.    Historical Provider, MD  esomeprazole (NEXIUM) 40 MG capsule Take 40 mg by mouth daily at 12 noon.    Historical Provider, MD  ferrous sulfate 325 (65 FE) MG tablet Take 325 mg by mouth daily with breakfast.    Historical Provider, MD  furosemide (LASIX) 40 MG tablet Take 1 tablet (40 mg total) by mouth 2 (two) times daily. 05/30/16   Altamese Dilling, MD  HYDROcodone-acetaminophen (NORCO/VICODIN) 5-325 MG tablet Take 1 tablet by mouth every 6 (six) hours as needed for moderate pain. 05/30/16   Altamese Dilling, MD  insulin aspart (NOVOLOG) 100 UNIT/ML injection Inject 10 Units into the skin 3 (three) times daily with meals.    Historical Provider, MD  insulin glargine (LANTUS) 100 UNIT/ML injection Inject 40 Units into the skin at bedtime.    Historical Provider, MD  levocetirizine (XYZAL) 5 MG tablet Take 5 mg by mouth at bedtime.     Historical Provider, MD  metFORMIN (GLUCOPHAGE) 1000 MG tablet Take 1,000 mg by mouth 2 (two) times daily with a meal.    Historical Provider, MD  Multiple Vitamins-Minerals (SENTRY SENIOR) TABS Take 1 tablet by mouth daily.    Historical Provider, MD  nitroGLYCERIN (NITROSTAT) 0.4 MG SL tablet Place 0.4 mg under the tongue every 5 (five) minutes as needed for chest pain.    Historical Provider, MD  pregabalin (LYRICA) 150 MG capsule Take 150  mg by mouth 3 (three) times daily.     Historical Provider, MD  rosuvastatin (CRESTOR) 40 MG tablet Take 40 mg by mouth daily.    Historical Provider, MD  sacubitril-valsartan (ENTRESTO) 24-26 MG Take 1 tablet by mouth 2 (two) times daily. 07/12/15   Delma Freeze, FNP  spironolactone (ALDACTONE) 50 MG tablet Take 50 mg by mouth daily.    Historical Provider, MD  tiotropium (SPIRIVA) 18 MCG inhalation capsule Place 18 mcg into inhaler and inhale daily.    Historical Provider, MD  Venlafaxine HCl 225 MG TB24 Take 225 mg by mouth daily.     Historical Provider, MD  vitamin C (ASCORBIC ACID) 500 MG tablet Take 500 mg by mouth daily.    Historical Provider, MD    Physical Exam: Vitals:   06/05/16 1807 06/05/16 1808 06/05/16 2124  BP:  (!) 168/79   Pulse:  83   Resp:  20   Temp:  98.5 F (36.9 C)   TempSrc:  Oral   SpO2:  93% 97%  Weight: 82.6 kg (182 lb)    Height: 5\' 5"  (1.651 m)      GENERAL: 60  y.o.-year-old female patient, well-developed, well-nourished lying in the bed in no acute distress.  Pleasant and cooperative.   HEENT: Head atraumatic, normocephalic. Pupils equal, round, reactive to light and accommodation. No scleral icterus. Extraocular muscles intact. Nares are patent. Oropharynx is clear. Mucus membranes moist. NECK: Supple, full range of motion. No JVD, no bruit heard. No thyroid enlargement, no tenderness, no cervical lymphadenopathy. CHEST: Bibasilar rales. No use of accessory muscles of respiration.  No reproducible chest wall tenderness.  CARDIOVASCULAR: S1, S2 normal. No murmurs, rubs, or gallops. Cap refill <2 seconds. Pulses intact distally.  ABDOMEN: Soft, nondistended, nontender. No rebound, guarding, rigidity. Normoactive bowel sounds present in all four quadrants. No organomegaly or mass. EXTREMITIES: No pedal edema, cyanosis, or clubbing. No calf tenderness or Homan's sign.  NEUROLOGIC: The patient is alert and oriented x 3. Cranial nerves II through XII are  grossly intact with no focal sensorimotor deficit. Muscle strength 5/5 in all extremities. Sensation intact. Gait not checked. PSYCHIATRIC:  Normal affect, mood, thought content. SKIN: Warm, dry, and intact without obvious rash, lesion, or ulcer.    Labs on Admission:  CBC:  Recent Labs Lab 06/05/16 1845  WBC 7.6  HGB 13.7  HCT 41.6  MCV 77.6*  PLT 239   Basic Metabolic Panel:  Recent Labs Lab 05/30/16 0436 06/05/16 1845  NA 137 134*  K 3.4* 3.5  CL 101 98*  CO2 29 27  GLUCOSE 271* 390*  BUN 25* 16  CREATININE 1.19* 0.94  CALCIUM 7.8* 9.0   GFR: Estimated Creatinine Clearance: 68.4 mL/min (by C-G formula based on SCr of 0.94 mg/dL). Liver Function Tests: No results for input(s): AST, ALT, ALKPHOS, BILITOT, PROT, ALBUMIN in the last 168 hours. No results for input(s): LIPASE, AMYLASE in the last 168 hours. No results for input(s): AMMONIA in the last 168 hours. Coagulation Profile: No results for input(s): INR, PROTIME in the last 168 hours. Cardiac Enzymes:  Recent Labs Lab 06/05/16 1845  TROPONINI 0.07*   BNP (last 3 results) No results for input(s): PROBNP in the last 8760 hours. HbA1C: No results for input(s): HGBA1C in the last 72 hours. CBG:  Recent Labs Lab 05/30/16 0734 05/30/16 1204  GLUCAP 264* 226*   Lipid Profile: No results for input(s): CHOL, HDL, LDLCALC, TRIG, CHOLHDL, LDLDIRECT in the last 72 hours. Thyroid Function Tests: No results for input(s): TSH, T4TOTAL, FREET4, T3FREE, THYROIDAB in the last 72 hours. Anemia Panel: No results for input(s): VITAMINB12, FOLATE, FERRITIN, TIBC, IRON, RETICCTPCT in the last 72 hours. Urine analysis:    Component Value Date/Time   COLORURINE YELLOW (A) 01/21/2015 2227   APPEARANCEUR CLOUDY (A) 01/21/2015 2227   APPEARANCEUR Clear 03/10/2013 1316   LABSPEC 1.015 01/21/2015 2227   LABSPEC 1.005 03/10/2013 1316   PHURINE 5.0 01/21/2015 2227   GLUCOSEU 50 (A) 01/21/2015 2227   GLUCOSEU Negative  03/10/2013 1316   HGBUR 2+ (A) 01/21/2015 2227   BILIRUBINUR NEGATIVE 01/21/2015 2227   BILIRUBINUR Negative 03/10/2013 1316   KETONESUR NEGATIVE 01/21/2015 2227   PROTEINUR 100 (A) 01/21/2015 2227   NITRITE NEGATIVE 01/21/2015 2227   LEUKOCYTESUR NEGATIVE 01/21/2015 2227   LEUKOCYTESUR Negative 03/10/2013 1316   Sepsis Labs: @LABRCNTIP (procalcitonin:4,lacticidven:4) )No results found for this or any previous visit (from the past 240 hour(s)).   Radiological Exams on Admission: Dg Chest 2 View  Result Date: 06/05/2016 CLINICAL DATA:  Productive cough. EXAM: CHEST  2 VIEW COMPARISON:  05/29/2016 FINDINGS: The cardiac silhouette is mildly enlarged. Mediastinal contours appear intact. Calcific  atherosclerotic disease of the aorta. There is no evidence of pleural effusion or pneumothorax. Patchy airspace consolidation in the lingula and right middle lobe. Low lung volumes. Osseous structures are without acute abnormality. Soft tissues are grossly normal. IMPRESSION: Patchy airspace consolidation in lingula and right middle lobe. Given the distribution, atypical pneumonia is of primary consideration. Enlarged cardiac silhouette. Electronically Signed   By: Ted Mcalpine M.D.   On: 06/05/2016 18:43    EKG: Normal sinus rhythm at 81 bpm with normal axis and nonspecific ST-T wave changes.  Increased QT at 469.  Assessment/Plan Active Problems:   HCAP (healthcare-associated pneumonia)    This is a 61 y.o. female with a history of CAD, COPD, DM, chronic combined systolic and diastolic CHF now being admitted with:  1. HCAP following hospitalization for CHF, COPD exacerbation one week ago - Admit to inpatient - IV Cefepime & Vancomycin - Gentle IV fluid hydration - Duonebs, expectorants & O2 therapy as needed - Follow up blood & sputum cultures -Consider infectious disease consultation if not improving.  2. History of CAD with elevated troponin - Cardiac monitoring - Trend  trops -Continue aspirin, Pletal, nitroglycerin, Crestor  3. History of COPD -Continue Spiriva, Symbicort, Xyzal  4. H/o Diabetes  - Accuchecks achs with RISS coverage -Continue Lantus -Hold Victoza, metformin - Heart healthy, carb controlled diet  5. History of congestive heart failure -Continue Coreg, Lasix, Entresto, Aldactone  6. History of GERD -Continue Nexium  Admission status: Inpatient IV Fluids: Gentle IV normal saline Diet/Nutrition: Heart healthy, carb controlled Consults called: None  DVT Px: Lovenox, SCDs and early ambulation. Code Status: Full Code  Disposition Plan: To home in 1-2 days   All the records are reviewed and case discussed with ED provider. Management plans discussed with the patient and/or family who express understanding and agree with plan of care.  Rambo Sarafian D.O. on 06/05/2016 at 10:59 PM Between 7am to 6pm - Pager - [phone] After 6pm go to [email or web address] - Social research officer, government [facility] [place] [facility] Office [phone] CC: Primary care physician; Keane Police, MD   06/05/2016, 10:59 PM

## 2016-06-05 NOTE — Telephone Encounter (Signed)
Patient did not show for her [facility] appointment on 06/05/16. Will attempt to reschedule.

## 2016-06-05 NOTE — ED Notes (Signed)
Pt states she is on chronic oxygen at home however pt did not bring oxygen with her. Checked oxygen which read 93% on room air. Pt placed on 3L of oxygen which is what pt states she wears at home.

## 2016-06-05 NOTE — Progress Notes (Signed)
Pharmacy Antibiotic Note  Alexis MustardDebra W Ray is a 61 y.o. female admitted on 06/05/2016 with PNA.  Pharmacy has been consulted for cefepime and vancomycin dosing.  Plan: 1. Cefepime 2 gm IV x 1 in ED followed by cefepime 1 gm IV Q8H  2. Vancomycin 1 gm IV x 1 in ED followed in approximately 6 hours (stacked dosing) by vancomycin 750 mg IV Q12H, predicted trough 15 mcg/mL. Pharmacy will continue to follow and adjust as needed to maintain trough 15 to 20 mcg/mL.   Vd 46.9 L, Ke 0.061 hr-1, T1/2 11.4 hr  Height: 5\' 5"  (165.1 cm) Weight: 182 lb (82.6 kg) IBW/kg (Calculated) : 57  Temp (24hrs), Avg:98.5 F (36.9 C), Min:98.5 F (36.9 C), Max:98.5 F (36.9 C)   Recent Labs Lab 05/30/16 0436 06/05/16 1845  WBC  --  7.6  CREATININE 1.19* 0.94    Estimated Creatinine Clearance: 68.4 mL/min (by C-G formula based on SCr of 0.94 mg/dL).    Allergies  Allergen Reactions  . Colchicine     itching  . Sulfa Antibiotics Rash    itching  . Levaquin [Levofloxacin In D5w] Rash   Thank you for allowing pharmacy to be a part of this patient's care.  Carola FrostNathan A Dujuan Stankowski, Pharm.D., BCPS Clinical Pharmacist 06/05/2016 10:45 PM

## 2016-06-05 NOTE — ED Provider Notes (Signed)
[facility] Emergency Department Provider Note   First MD Initiated Contact with Patient 05/29/16 [phone]     (approximate)  I have reviewed the triage vital signs and the nursing notes.   HISTORY  Chief Complaint Cough and Nasal Congestion    HPI Alexis Ray is a 61 y.o. female with history of below list of chronic medical conditions including COPD and congestive heart failure presents with nonproductive cough, bilateral lower extremity swelling positive orthopnea. Past Medical History:  Diagnosis Date  . CAD (coronary artery disease)   . Chronic combined systolic and diastolic CHF (congestive heart failure) (HCC)   . COPD (chronic obstructive pulmonary disease) (HCC)   . Diabetes mellitus without complication (HCC)   . GERD (gastroesophageal reflux disease)   . Hypertension   . PVD (peripheral vascular disease) (HCC)   . Stroke (cerebrum) [facility])     Patient Active Problem List   Diagnosis Date Noted  . Acute on chronic systolic CHF (congestive heart failure), NYHA class 4 (HCC) 05/29/2016  . Chronic systolic heart failure (HCC) 06/07/2015  . Numbness in both legs 06/07/2015  . Tobacco use 06/07/2015  . GERD (gastroesophageal reflux disease) 05/23/2015  . Type 2 diabetes mellitus (HCC) 05/23/2015  . HTN (hypertension) 05/23/2015  . PVD (peripheral vascular disease) (HCC) 05/23/2015  . OSA on CPAP 05/23/2015    Past Surgical History:  Procedure Laterality Date  . ABDOMINAL HYSTERECTOMY    . CARDIAC CATHETERIZATION    . COLONOSCOPY    . EYE SURGERY Bilateral     Prior to Admission medications   Medication Sig Start Date End Date Taking? Authorizing Provider  albuterol (PROVENTIL) (2.5 MG/3ML) 0.083% nebulizer solution Take 3 mLs (2.5 mg total) by nebulization every 6 (six) hours as needed for wheezing or shortness of breath. 05/26/15  Yes Altamese Dilling, MD  albuterol (VENTOLIN HFA) 108 (90 Base) MCG/ACT inhaler Inhale into the lungs  every 6 (six) hours as needed for wheezing or shortness of breath.   Yes Historical Provider, MD  aspirin EC 81 MG tablet Take 81 mg by mouth daily.   Yes Historical Provider, MD  budesonide-formoterol (SYMBICORT) 160-4.5 MCG/ACT inhaler Inhale 2 puffs into the lungs 2 (two) times daily.   Yes Historical Provider, MD  Cholecalciferol 10000 units CAPS Take 1,000 Units by mouth daily.   Yes Historical Provider, MD  cilostazol (PLETAL) 100 MG tablet Take 100 mg by mouth 2 (two) times daily.   Yes Historical Provider, MD  esomeprazole (NEXIUM) 40 MG capsule Take 40 mg by mouth daily at 12 noon.   Yes Historical Provider, MD  ferrous sulfate 325 (65 FE) MG tablet Take 325 mg by mouth daily with breakfast.   Yes Historical Provider, MD  insulin aspart (NOVOLOG) 100 UNIT/ML injection Inject 10 Units into the skin 3 (three) times daily with meals.   Yes Historical Provider, MD  insulin glargine (LANTUS) 100 UNIT/ML injection Inject 40 Units into the skin at bedtime.   Yes Historical Provider, MD  levocetirizine (XYZAL) 5 MG tablet Take 5 mg by mouth at bedtime.    Yes Historical Provider, MD  metFORMIN (GLUCOPHAGE) 1000 MG tablet Take 1,000 mg by mouth 2 (two) times daily with a meal.   Yes Historical Provider, MD  Multiple Vitamins-Minerals (SENTRY SENIOR) TABS Take 1 tablet by mouth daily.   Yes Historical Provider, MD  nitroGLYCERIN (NITROSTAT) 0.4 MG SL tablet Place 0.4 mg under the tongue every 5 (five) minutes as needed for chest pain.  Yes Historical Provider, MD  pregabalin (LYRICA) 150 MG capsule Take 150 mg by mouth 3 (three) times daily.    Yes Historical Provider, MD  rosuvastatin (CRESTOR) 40 MG tablet Take 40 mg by mouth daily.   Yes Historical Provider, MD  sacubitril-valsartan (ENTRESTO) 24-26 MG Take 1 tablet by mouth 2 (two) times daily. 07/12/15  Yes Delma Freezeina A Hackney, FNP  spironolactone (ALDACTONE) 50 MG tablet Take 50 mg by mouth daily.   Yes Historical Provider, MD  tiotropium (SPIRIVA)  18 MCG inhalation capsule Place 18 mcg into inhaler and inhale daily.   Yes Historical Provider, MD  Venlafaxine HCl 225 MG TB24 Take 225 mg by mouth daily.    Yes Historical Provider, MD  vitamin C (ASCORBIC ACID) 500 MG tablet Take 500 mg by mouth daily.   Yes Historical Provider, MD  carvedilol (COREG) 3.125 MG tablet Take 1 tablet (3.125 mg total) by mouth 2 (two) times daily with a meal. 05/30/16   Altamese DillingVaibhavkumar Vachhani, MD  furosemide (LASIX) 40 MG tablet Take 1 tablet (40 mg total) by mouth 2 (two) times daily. 05/30/16   Altamese DillingVaibhavkumar Vachhani, MD  HYDROcodone-acetaminophen (NORCO/VICODIN) 5-325 MG tablet Take 1 tablet by mouth every 6 (six) hours as needed for moderate pain. 05/30/16   Altamese DillingVaibhavkumar Vachhani, MD    Allergies Colchicine; Sulfa antibiotics; and Levaquin [levofloxacin in d5w]  Family History  Problem Relation Age of Onset  . Diabetes Mother   . Cancer Father   . Diabetes Maternal Aunt   . Diabetes Maternal Aunt     Social History Social History  Substance Use Topics  . Smoking status: Current Every Day Smoker    Packs/day: 0.50    Types: Cigarettes  . Smokeless tobacco: Never Used  . Alcohol use No    Review of Systems Constitutional:Negative for fever/chills Eyes: No visual changes. ENT: No sore throat. Cardiovascular: Denies chest pain. Respiratory: Denies shortness of breath. Positive for cough Gastrointestinal: No abdominal pain.  No nausea, no vomiting.  No diarrhea.  No constipation. Genitourinary: Negative for dysuria. Musculoskeletal: Negative for back pain. Skin: Negative for rash. Neurological: Negative for headaches, focal weakness or numbness.  10-point ROS otherwise negative.  ____________________________________________   PHYSICAL EXAM:  VITAL SIGNS: ED Triage Vitals  Enc Vitals Group     BP 05/29/16 0023 (!) 141/61     Pulse Rate 05/29/16 0023 95     Resp 05/29/16 0023 20     Temp 05/29/16 0023 99.9 F (37.7 C)     Temp Source  05/29/16 0023 Oral     SpO2 05/29/16 0023 97 %     Weight 05/29/16 0023 187 lb (84.8 kg)     Height 05/29/16 0023 5\' 5"  (1.651 m)     Head Circumference --      Peak Flow --      Pain Score 05/29/16 0643 4     Pain Loc --      Pain Edu? --      Excl. in GC? --     Constitutional: Alert and oriented. Well appearing and in no acute distress. Eyes: Conjunctivae are normal. PERRL. EOMI. Head: Atraumatic.Marland Kitchen. Mouth/Throat: Mucous membranes are moist.  Oropharynx non-erythematous. Neck: No stridor.   Cardiovascular: Normal rate, regular rhythm. Good peripheral circulation. Grossly normal heart sounds. Respiratory: Normal respiratory effort.  No retractions. Lungs CTAB. Gastrointestinal: Soft and nontender. No distention.   Musculoskeletal: Trace bilateral lower extremity edema No gross deformities of extremities. Neurologic:  Normal speech and language. No  gross focal neurologic deficits are appreciated.  Skin:  Skin is warm, dry and intact. No rash noted.   ____________________________________________   LABS (all labs ordered are listed, but only abnormal results are displayed)  Labs Reviewed  CBC - Abnormal; Notable for the following:       Result Value   MCV 78.0 (*)    MCH 25.9 (*)    RDW 17.8 (*)    Platelets 131 (*)    All other components within normal limits  COMPREHENSIVE METABOLIC PANEL - Abnormal; Notable for the following:    Glucose, Bld 278 (*)    Creatinine, Ser 1.21 (*)    Calcium 7.9 (*)    Albumin 3.4 (*)    GFR calc non Af Amer 48 (*)    GFR calc Af Amer 56 (*)    All other components within normal limits  BRAIN NATRIURETIC PEPTIDE - Abnormal; Notable for the following:    B Natriuretic Peptide 164.0 (*)    All other components within normal limits  TROPONIN I - Abnormal; Notable for the following:    Troponin I 0.10 (*)    All other components within normal limits  HEMOGLOBIN A1C - Abnormal; Notable for the following:    Hgb A1c MFr Bld 10.1 (*)    All  other components within normal limits  TROPONIN I - Abnormal; Notable for the following:    Troponin I 0.10 (*)    All other components within normal limits  TROPONIN I - Abnormal; Notable for the following:    Troponin I 0.07 (*)    All other components within normal limits  TROPONIN I - Abnormal; Notable for the following:    Troponin I 0.07 (*)    All other components within normal limits  GLUCOSE, CAPILLARY - Abnormal; Notable for the following:    Glucose-Capillary 234 (*)    All other components within normal limits  GLUCOSE, CAPILLARY - Abnormal; Notable for the following:    Glucose-Capillary 178 (*)    All other components within normal limits  BASIC METABOLIC PANEL - Abnormal; Notable for the following:    Potassium 3.4 (*)    Glucose, Bld 271 (*)    BUN 25 (*)    Creatinine, Ser 1.19 (*)    Calcium 7.8 (*)    GFR calc non Af Amer 49 (*)    GFR calc Af Amer 57 (*)    All other components within normal limits  GLUCOSE, CAPILLARY - Abnormal; Notable for the following:    Glucose-Capillary 324 (*)    All other components within normal limits  GLUCOSE, CAPILLARY - Abnormal; Notable for the following:    Glucose-Capillary 293 (*)    All other components within normal limits  GLUCOSE, CAPILLARY - Abnormal; Notable for the following:    Glucose-Capillary 264 (*)    All other components within normal limits  GLUCOSE, CAPILLARY - Abnormal; Notable for the following:    Glucose-Capillary 226 (*)    All other components within normal limits  INFLUENZA PANEL BY PCR (TYPE A & B)  TSH     RADIOLOGY I, [place] N BROWN, personally viewed and evaluated these images (plain radiographs) as part of my medical decision making, as well as reviewing the written report by the radiologist.  CLINICAL DATA:  Initial evaluation for acute cough, chills.  EXAM: CHEST  2 VIEW  COMPARISON:  Prior radiograph from 05/23/2015.  FINDINGS: Mild cardiomegaly is similar to previous.  Mediastinal silhouette within normal  limits. Aortic atherosclerosis noted.  Lungs mildly hypoinflated. Mild diffuse pulmonary vascular congestion with interstitial prominence suggestive of pulmonary interstitial edema. No pleural effusion. No consolidative airspace disease. No pneumothorax.  No acute osseous abnormality.  IMPRESSION: 1. Mild diffuse pulmonary interstitial edema, suggesting CHF. 2. Aortic atherosclerosis.   Electronically Signed   By: Rise Mu M.D.   On: 05/29/2016 04:04 _____________  Procedures     INITIAL IMPRESSION / ASSESSMENT AND PLAN / ED COURSE  Pertinent labs & imaging results that were available during my care of the patient were reviewed by me and considered in my medical decision making (see chart for details). 61 year old female presents to the emergency department with dyspnea orthopnea nonproductive cough and bilateral extremity edema. Patient received Lasix in the emergency department but continues to have dyspnea oxygen saturation currently 89% at bedside. As such patient discussed with Dr. Sheryle Hail for hospital admission for further evaluation and management for possible CHF exacerbation.      ____________________________________________  FINAL CLINICAL IMPRESSION(S) / ED DIAGNOSES  Congestive heart failure exacerbation  MEDICATIONS GIVEN DURING THIS VISIT:  Medications  furosemide (LASIX) injection 40 mg (40 mg Intravenous Given 05/29/16 0429)  ipratropium-albuterol (DUONEB) 0.5-2.5 (3) MG/3ML nebulizer solution 3 mL (3 mLs Nebulization Given 05/29/16 0422)  furosemide (LASIX) injection 40 mg (40 mg Intravenous Given 05/29/16 1831)  potassium chloride SA (K-DUR,KLOR-CON) CR tablet 40 mEq (40 mEq Oral Given 05/30/16 1019)     NEW OUTPATIENT MEDICATIONS STARTED DURING THIS VISIT:  Discharge Medication List as of 05/30/2016  4:01 PM      Discharge Medication List as of 05/30/2016  4:01 PM    CONTINUE these medications  which have CHANGED   Details  carvedilol (COREG) 3.125 MG tablet Take 1 tablet (3.125 mg total) by mouth 2 (two) times daily with a meal., Starting Fri 05/30/2016, Print    furosemide (LASIX) 40 MG tablet Take 1 tablet (40 mg total) by mouth 2 (two) times daily., Starting Fri 05/30/2016, Normal    HYDROcodone-acetaminophen (NORCO/VICODIN) 5-325 MG tablet Take 1 tablet by mouth every 6 (six) hours as needed for moderate pain., Starting Fri 05/30/2016, Print        Discharge Medication List as of 05/30/2016  4:01 PM    STOP taking these medications     diphenhydrAMINE (BENADRYL) 25 mg capsule Comments:  Reason for Stopping:       docusate sodium (COLACE) 100 MG capsule Comments:  Reason for Stopping:       metoCLOPramide (REGLAN) 10 MG tablet Comments:  Reason for Stopping:           Note:  This document was prepared using Dragon voice recognition software and may include unintentional dictation errors.    Darci Current, MD 06/05/16 [phone]

## 2016-06-05 NOTE — ED Triage Notes (Signed)
Pt arrived to ED from home reporting a productive cough with grey and yellow sputum. Pt denies having fever. Pt was seen here last week for CHF and COPD and discharged. Pt reports cough began after discharge along with SOB and cold sweats. Pt had one reported episode of vomiting today and nausea reported at this time. Pt alert and oriented at this time. Pt reports swelling in legs has not increased since d/c.

## 2016-06-05 NOTE — ED Provider Notes (Signed)
[facility] Emergency Department Provider Note   ____________________________________________   First MD Initiated Contact with Patient 06/05/16 2117     (approximate)  I have reviewed the triage vital signs and the nursing notes.   HISTORY  Chief Complaint Shortness of Breath   HPI Alexis Ray is a 61 y.o. female with a history of CAD as well as COPD and CHF who is presenting to the emergency department with 1 day of cough with shortness of breath. She presented today because of her worsening symptoms as well as her health care provider at her home finding her to be 80% on her 3 L of supplemental O2. She says that she is also having sharp and pressure-like chest pain especially when she coughs. She denies any fever at home. Says that her chest pain is midsternal and nonradiating.Also with 1 episode of vomiting earlier today which she said was not posttussive.   Past Medical History:  Diagnosis Date  . CAD (coronary artery disease)   . Chronic combined systolic and diastolic CHF (congestive heart failure) (HCC)   . COPD (chronic obstructive pulmonary disease) (HCC)   . Diabetes mellitus without complication (HCC)   . GERD (gastroesophageal reflux disease)   . Hypertension   . PVD (peripheral vascular disease) (HCC)   . Stroke (cerebrum) [facility])     Patient Active Problem List   Diagnosis Date Noted  . Acute on chronic systolic CHF (congestive heart failure), NYHA class 4 (HCC) 05/29/2016  . Chronic systolic heart failure (HCC) 06/07/2015  . Numbness in both legs 06/07/2015  . Tobacco use 06/07/2015  . GERD (gastroesophageal reflux disease) 05/23/2015  . Type 2 diabetes mellitus (HCC) 05/23/2015  . HTN (hypertension) 05/23/2015  . PVD (peripheral vascular disease) (HCC) 05/23/2015  . OSA on CPAP 05/23/2015    Past Surgical History:  Procedure Laterality Date  . ABDOMINAL HYSTERECTOMY    . CARDIAC CATHETERIZATION    . COLONOSCOPY    . EYE  SURGERY Bilateral     Prior to Admission medications   Medication Sig Start Date End Date Taking? Authorizing Provider  albuterol (PROVENTIL) (2.5 MG/3ML) 0.083% nebulizer solution Take 3 mLs (2.5 mg total) by nebulization every 6 (six) hours as needed for wheezing or shortness of breath. 05/26/15   Altamese Dilling, MD  albuterol (VENTOLIN HFA) 108 (90 Base) MCG/ACT inhaler Inhale into the lungs every 6 (six) hours as needed for wheezing or shortness of breath.    Historical Provider, MD  aspirin EC 81 MG tablet Take 81 mg by mouth daily.    Historical Provider, MD  budesonide-formoterol (SYMBICORT) 160-4.5 MCG/ACT inhaler Inhale 2 puffs into the lungs 2 (two) times daily.    Historical Provider, MD  carvedilol (COREG) 3.125 MG tablet Take 1 tablet (3.125 mg total) by mouth 2 (two) times daily with a meal. 05/30/16   Altamese Dilling, MD  Cholecalciferol 10000 units CAPS Take 1,000 Units by mouth daily.    Historical Provider, MD  cilostazol (PLETAL) 100 MG tablet Take 100 mg by mouth 2 (two) times daily.    Historical Provider, MD  esomeprazole (NEXIUM) 40 MG capsule Take 40 mg by mouth daily at 12 noon.    Historical Provider, MD  ferrous sulfate 325 (65 FE) MG tablet Take 325 mg by mouth daily with breakfast.    Historical Provider, MD  furosemide (LASIX) 40 MG tablet Take 1 tablet (40 mg total) by mouth 2 (two) times daily. 05/30/16   Altamese Dilling, MD  HYDROcodone-acetaminophen (NORCO/VICODIN) 5-325 MG tablet Take 1 tablet by mouth every 6 (six) hours as needed for moderate pain. 05/30/16   Altamese DillingVaibhavkumar Vachhani, MD  insulin aspart (NOVOLOG) 100 UNIT/ML injection Inject 10 Units into the skin 3 (three) times daily with meals.    Historical Provider, MD  insulin glargine (LANTUS) 100 UNIT/ML injection Inject 40 Units into the skin at bedtime.    Historical Provider, MD  levocetirizine (XYZAL) 5 MG tablet Take 5 mg by mouth at bedtime.     Historical Provider, MD  metFORMIN  (GLUCOPHAGE) 1000 MG tablet Take 1,000 mg by mouth 2 (two) times daily with a meal.    Historical Provider, MD  Multiple Vitamins-Minerals (SENTRY SENIOR) TABS Take 1 tablet by mouth daily.    Historical Provider, MD  nitroGLYCERIN (NITROSTAT) 0.4 MG SL tablet Place 0.4 mg under the tongue every 5 (five) minutes as needed for chest pain.    Historical Provider, MD  pregabalin (LYRICA) 150 MG capsule Take 150 mg by mouth 3 (three) times daily.     Historical Provider, MD  rosuvastatin (CRESTOR) 40 MG tablet Take 40 mg by mouth daily.    Historical Provider, MD  sacubitril-valsartan (ENTRESTO) 24-26 MG Take 1 tablet by mouth 2 (two) times daily. 07/12/15   Delma Freezeina A Hackney, FNP  spironolactone (ALDACTONE) 50 MG tablet Take 50 mg by mouth daily.    Historical Provider, MD  tiotropium (SPIRIVA) 18 MCG inhalation capsule Place 18 mcg into inhaler and inhale daily.    Historical Provider, MD  Venlafaxine HCl 225 MG TB24 Take 225 mg by mouth daily.     Historical Provider, MD  vitamin C (ASCORBIC ACID) 500 MG tablet Take 500 mg by mouth daily.    Historical Provider, MD    Allergies Colchicine; Sulfa antibiotics; and Levaquin [levofloxacin in d5w]  Family History  Problem Relation Age of Onset  . Diabetes Mother   . Cancer Father   . Diabetes Maternal Aunt   . Diabetes Maternal Aunt     Social History Social History  Substance Use Topics  . Smoking status: Current Every Day Smoker    Packs/day: 0.00    Types: Cigarettes  . Smokeless tobacco: Never Used  . Alcohol use No    Review of Systems Constitutional: No fever/chills Eyes: No visual changes. ENT: No sore throat. Cardiovascular:as above Respiratory: as above. Gastrointestinal: No abdominal pain.   No diarrhea.  No constipation. Genitourinary: Negative for dysuria. Musculoskeletal: Negative for back pain. Skin: Negative for rash. Neurological: Negative for headaches, focal weakness or numbness.  10-point ROS otherwise  negative.  ____________________________________________   PHYSICAL EXAM:  VITAL SIGNS: ED Triage Vitals  Enc Vitals Group     BP 06/05/16 1808 (!) 168/79     Pulse Rate 06/05/16 1808 83     Resp 06/05/16 1808 20     Temp 06/05/16 1808 98.5 F (36.9 C)     Temp Source 06/05/16 1808 Oral     SpO2 06/05/16 1808 93 %     Weight 06/05/16 1807 182 lb (82.6 kg)     Height 06/05/16 1807 5\' 5"  (1.651 m)     Head Circumference --      Peak Flow --      Pain Score --      Pain Loc --      Pain Edu? --      Excl. in GC? --     Constitutional: Alert and oriented. Well appearing and in no acute distress.  Eyes: Conjunctivae are normal. PERRL. EOMI. Head: Atraumatic. Nose: No congestion/rhinnorhea.Wearing nasal cannula oxygen. Mouth/Throat: Mucous membranes are moist.   Neck: No stridor.   Cardiovascular: Normal rate, regular rhythm. Grossly normal heart sounds.   Respiratory: Normal respiratory effort.  No retractions. Mild rales throughout especially to the bilateral bases. Gastrointestinal: Soft and nontender. No distention.  Musculoskeletal: No lower extremity tenderness nor edema.  No joint effusions. Neurologic:  Normal speech and language. No gross focal neurologic deficits are appreciated.  Skin:  Skin is warm, dry and intact. No rash noted. Psychiatric: Mood and affect are normal. Speech and behavior are normal.  ____________________________________________   LABS (all labs ordered are listed, but only abnormal results are displayed)  Labs Reviewed  BASIC METABOLIC PANEL - Abnormal; Notable for the following:       Result Value   Sodium 134 (*)    Chloride 98 (*)    Glucose, Bld 390 (*)    All other components within normal limits  CBC - Abnormal; Notable for the following:    RBC 5.37 (*)    MCV 77.6 (*)    MCH 25.4 (*)    RDW 17.8 (*)    All other components within normal limits  TROPONIN I - Abnormal; Notable for the following:    Troponin I 0.07 (*)    All  other components within normal limits  RAPID INFLUENZA A&B ANTIGENS ([facility] ONLY)  CULTURE, BLOOD (ROUTINE X 2)  CULTURE, BLOOD (ROUTINE X 2)   ____________________________________________  EKG  ED ECG REPORT I, Arelia Longest, the attending physician, personally viewed and interpreted this ECG.   Date: 06/05/2016  EKG Time: 1827  Rate: 81  Rhythm: normal sinus rhythm  Axis: normal  Intervals: Mildly prolonged QT interval at 469  ST&T Change: No ST segment elevation or depression. No abnormal T-wave inversion.  ____________________________________________  RADIOLOGY  DG Chest 2 View (Final result)  Result time 06/05/16 18:43:56  Final result by Eugenie Norrie, MD (06/05/16 18:43:56)           Narrative:   CLINICAL DATA: Productive cough.  EXAM: CHEST 2 VIEW  COMPARISON: 05/29/2016  FINDINGS: The cardiac silhouette is mildly enlarged. Mediastinal contours appear intact. Calcific atherosclerotic disease of the aorta.  There is no evidence of pleural effusion or pneumothorax. Patchy airspace consolidation in the lingula and right middle lobe. Low lung volumes.  Osseous structures are without acute abnormality. Soft tissues are grossly normal.  IMPRESSION: Patchy airspace consolidation in lingula and right middle lobe. Given the distribution, atypical pneumonia is of primary consideration.  Enlarged cardiac silhouette.   Electronically Signed By: Ted Mcalpine M.D. On: 06/05/2016 18:43            ____________________________________________   PROCEDURES  Procedure(s) performed:   Procedures  Critical Care performed:   ____________________________________________   INITIAL IMPRESSION / ASSESSMENT AND PLAN / ED COURSE  Pertinent labs & imaging results that were available during my care of the patient were reviewed by me and considered in my medical decision making (see chart for  details).  ----------------------------------------- 10:47 PM on 06/05/2016 -----------------------------------------  Patient with interval development of left-sided pneumonia with hypoxia at home on her 3 L nasal cannula oxygen. She'll be admitted to the hospital with healthcare associated pneumonia. Discussed with the patient possible discharge to home. However, she is allergic to Levaquin and cannot go on this antibiotic. I feel that other antibiotic regimens will be suboptimal given her comorbidities it is likely best  for her to stay overnight in the hospital. She is agreeable and would prefer this plan when I discussed her with her. Signed out to Dr.Hugelmeyer of the hospitalist service.      ____________________________________________   FINAL CLINICAL IMPRESSION(S) / ED DIAGNOSES  Care associated pneumonia.    NEW MEDICATIONS STARTED DURING THIS VISIT:  New Prescriptions   No medications on file     Note:  This document was prepared using Dragon voice recognition software and may include unintentional dictation errors.    Myrna Blazer, MD 06/05/16 [phone]

## 2016-06-06 LAB — GLUCOSE, CAPILLARY
GLUCOSE-CAPILLARY: 244 mg/dL — AB (ref 65–99)
GLUCOSE-CAPILLARY: 260 mg/dL — AB (ref 65–99)
Glucose-Capillary: 306 mg/dL — ABNORMAL HIGH (ref 65–99)
Glucose-Capillary: 291 mg/dL — ABNORMAL HIGH (ref 65–99)
Glucose-Capillary: 515 mg/dL (ref 65–99)
Glucose-Capillary: 474 mg/dL — ABNORMAL HIGH (ref 65–99)

## 2016-06-06 LAB — MAGNESIUM: Magnesium: 1.9 mg/dL (ref 1.7–2.4)

## 2016-06-06 LAB — TROPONIN I
Troponin I: 0.06 ng/mL (ref <0.03)
Troponin I: 0.07 ng/mL (ref <0.03)

## 2016-06-06 LAB — MRSA PCR SCREENING: MRSA by PCR: NEGATIVE

## 2016-06-06 MED ORDER — FUROSEMIDE 40 MG PO TABS
40.0000 mg | ORAL_TABLET | Freq: Two times a day (BID) | ORAL | Status: DC
  Administered 2016-06-07 (×2): 40 mg via ORAL
  Filled 2016-06-06 (×2): qty 1

## 2016-06-06 MED ORDER — PREDNISONE 50 MG PO TABS
50.0000 mg | ORAL_TABLET | Freq: Every day | ORAL | Status: DC
  Administered 2016-06-06 – 2016-06-09 (×4): 50 mg via ORAL
  Filled 2016-06-06 (×4): qty 1

## 2016-06-06 MED ORDER — CETIRIZINE HCL 10 MG PO TABS
10.0000 mg | ORAL_TABLET | Freq: Every day | ORAL | Status: DC
  Administered 2016-06-06 – 2016-06-09 (×4): 10 mg via ORAL
  Filled 2016-06-06 (×4): qty 1

## 2016-06-06 MED ORDER — VENLAFAXINE HCL ER 75 MG PO CP24
225.0000 mg | ORAL_CAPSULE | Freq: Every day | ORAL | Status: DC
  Administered 2016-06-06 – 2016-06-09 (×4): 225 mg via ORAL
  Filled 2016-06-06 (×4): qty 3

## 2016-06-06 MED ORDER — INSULIN GLARGINE 100 UNIT/ML ~~LOC~~ SOLN
50.0000 [IU] | Freq: Every day | SUBCUTANEOUS | Status: DC
  Administered 2016-06-06 – 2016-06-07 (×2): 50 [IU] via SUBCUTANEOUS
  Filled 2016-06-06 (×3): qty 0.5

## 2016-06-06 MED ORDER — DIPHENHYDRAMINE HCL 25 MG PO CAPS
25.0000 mg | ORAL_CAPSULE | Freq: Three times a day (TID) | ORAL | Status: DC | PRN
  Administered 2016-06-06: 25 mg via ORAL
  Filled 2016-06-06: qty 1

## 2016-06-06 MED ORDER — INSULIN ASPART 100 UNIT/ML ~~LOC~~ SOLN
6.0000 [IU] | Freq: Once | SUBCUTANEOUS | Status: AC
  Administered 2016-06-06: 6 [IU] via SUBCUTANEOUS
  Filled 2016-06-06: qty 6

## 2016-06-06 MED ORDER — FUROSEMIDE 10 MG/ML IJ SOLN
60.0000 mg | Freq: Two times a day (BID) | INTRAMUSCULAR | Status: AC
  Administered 2016-06-06 (×2): 60 mg via INTRAVENOUS
  Filled 2016-06-06 (×2): qty 6

## 2016-06-06 NOTE — Progress Notes (Signed)
MD notified about BS; ordered 1 time dose 6 units novolog.

## 2016-06-06 NOTE — Progress Notes (Signed)
[facility] - [place] at [facility]   PATIENT NAME: Alexis Ray    MR#:  [id number]  DATE OF BIRTH:  [birth year]  SUBJECTIVE:  CHIEF COMPLAINT:   Chief Complaint  Patient presents with  . Shortness of Breath   Still has shortness of breath. Dry cough. Some wheezing. Afebrile. On 3 L oxygen at home.  REVIEW OF SYSTEMS:    Review of Systems  Constitutional: Positive for malaise/fatigue. Negative for chills and fever.  HENT: Negative for sore throat.   Eyes: Negative for blurred vision, double vision and pain.  Respiratory: Positive for cough, shortness of breath and wheezing. Negative for hemoptysis.   Cardiovascular: Positive for orthopnea. Negative for chest pain, palpitations and leg swelling.  Gastrointestinal: Negative for abdominal pain, constipation, diarrhea, heartburn, nausea and vomiting.  Genitourinary: Negative for dysuria and hematuria.  Musculoskeletal: Negative for back pain and joint pain.  Skin: Negative for rash.  Neurological: Positive for weakness. Negative for sensory change, speech change, focal weakness and headaches.  Endo/Heme/Allergies: Does not bruise/bleed easily.  Psychiatric/Behavioral: Negative for depression. The patient is not nervous/anxious.     DRUG ALLERGIES:   Allergies  Allergen Reactions  . Colchicine     itching  . Sulfa Antibiotics Rash    itching  . Levaquin [Levofloxacin In D5w] Rash    VITALS:  Blood pressure (!) 126/56, pulse 73, temperature 98.7 F (37.1 C), temperature source Oral, resp. rate 20, height 5\' 5"  (1.651 m), weight 82.6 kg (182 lb), SpO2 95 %.  PHYSICAL EXAMINATION:   Physical Exam  GENERAL:  61 y.o.-year-old patient lying in the bed. Morbidly obese EYES: Pupils equal, round, reactive to light and accommodation. No scleral icterus. Extraocular muscles intact.  HEENT: Head atraumatic, normocephalic. Oropharynx and nasopharynx clear.  NECK:  Supple, no jugular venous distention. No thyroid  enlargement, no tenderness.  LUNGS: bilateral wheezing and crackles  CARDIOVASCULAR: S1, S2 normal. No murmurs, rubs, or gallops.  ABDOMEN: Soft, nontender, nondistended. Bowel sounds present. No organomegaly or mass.  EXTREMITIES: No cyanosis, clubbing or edema b/l.    NEUROLOGIC: Cranial nerves II through XII are intact. No focal Motor or sensory deficits b/l.   PSYCHIATRIC: The patient is alert and oriented x 3.  SKIN: No obvious rash, lesion, or ulcer.   LABORATORY PANEL:   CBC  Recent Labs Lab 06/05/16 1845  WBC 7.6  HGB 13.7  HCT 41.6  PLT 239   ------------------------------------------------------------------------------------------------------------------ Chemistries   Recent Labs Lab 06/05/16 1845 06/06/16 0440  NA 134*  --   K 3.5  --   CL 98*  --   CO2 27  --   GLUCOSE 390*  --   BUN 16  --   CREATININE 0.94  --   CALCIUM 9.0  --   MG  --  1.9   ------------------------------------------------------------------------------------------------------------------  Cardiac Enzymes  Recent Labs Lab 06/06/16 0440  TROPONINI 0.06*   ------------------------------------------------------------------------------------------------------------------  RADIOLOGY:  Dg Chest 2 View  Result Date: 06/05/2016 CLINICAL DATA:  Productive cough. EXAM: CHEST  2 VIEW COMPARISON:  05/29/2016 FINDINGS: The cardiac silhouette is mildly enlarged. Mediastinal contours appear intact. Calcific atherosclerotic disease of the aorta. There is no evidence of pleural effusion or pneumothorax. Patchy airspace consolidation in the lingula and right middle lobe. Low lung volumes. Osseous structures are without acute abnormality. Soft tissues are grossly normal. IMPRESSION: Patchy airspace consolidation in lingula and right middle lobe. Given the distribution, atypical pneumonia is of primary consideration. Enlarged cardiac silhouette. Electronically Signed  By: Ted Mcalpineobrinka  Dimitrova M.D.   On:  06/05/2016 18:43     ASSESSMENT AND PLAN:   This is a 61 y.o. female with a history of CAD, COPD, DM, chronic combined systolic and diastolic CHF now being admitted with:  * Right pneumonia with COPD exacerbation an acute on chronic respiratory failure On IV cefepime and vancomycin. Check MRSA PCR. Discontinue vancomycin. Start prednisone. Nebulizers. Wean oxygen as tolerated.  * acute on chronic systolic CHF - IV Lasix, Beta blockers - Input and Output - Counseled to limit fluids and Salt - Monitor Bun/Cr and Potassium  * History of CAD with elevated troponin - Cardiac monitoring - troponins stable -Continue aspirin, Pletal, nitroglycerin, Crestor -due to demand ischemia. Not MI.  * diabetes mellitus Sliding scale insulin  * History of GERD -Continue Nexium  All the records are reviewed and case discussed with Care Management/Social Workerr. Management plans discussed with the patient, family and they are in agreement.  CODE STATUS: FULL CODE  DVT Prophylaxis: SCDs  TOTAL TIME TAKING CARE OF THIS PATIENT: 35 minutes.   POSSIBLE D/C IN 1-2 DAYS, DEPENDING ON CLINICAL CONDITION.  Milagros LollSudini, Tyishia Aune R M.D on 06/06/2016 at 3:28 PM  Between 7am to 6pm - Pager - [phone]  After 6pm go to [email or web address] - password [email or web address] [facility]  [facility] [place] [facility]  Office  [phone]  CC: Primary care physician; Keane PoliceNOORANI, SEZMIN S, MD  Note: This dictation was prepared with Dragon dictation along with smaller phrase technology. Any transcriptional errors that result from this process are unintentional.

## 2016-06-06 NOTE — Discharge Instructions (Signed)
Heart Failure Clinic appointment on June 12, 2016 at 11:30am with Clarisa Kindredina Fahad Cisse, FNP. Please call [phone] to reschedule.

## 2016-06-06 NOTE — Progress Notes (Signed)
Inpatient Diabetes Program Recommendations  AACE/ADA: New Consensus Statement on Inpatient Glycemic Control (2015)  Target Ranges:  Prepandial:   less than 140 mg/dL      Peak postprandial:   less than 180 mg/dL (1-2 hours)      Critically ill patients:  140 - 180 mg/dL   Lab Results  Component Value Date   GLUCAP 306 (H) 06/06/2016   HGBA1C 10.1 (H) 05/29/2016    Review of Glycemic Control:  Results for Alexis Ray, Alexis Ray (MRN [id number]) as of 06/06/2016 11:08  Ref. Range 06/06/2016 00:19 06/06/2016 07:26  Glucose-Capillary Latest Ref Range: 65 - 99 mg/dL 409260 (H) 811306 (H)   Diabetes history: Type 2 diabetes Outpatient Diabetes medications: Lantus 40 units q HS, Novolog 10 units tid with meals, Metformin 500 mg bid, Victoza 1.8 mg daily Current orders for Inpatient glycemic control:  Novolog moderate tid with meals and HS, Lantus 40 units q HS   Inpatient Diabetes Program Recommendations:    May consider adding Novolog 5 units tid with meals (hold if pt. eats less than 50%).  Thanks, Beryl MeagerJenny Jakari Sada, RN, BC-ADM Inpatient Diabetes Coordinator Pager [phone] (8a-5p)

## 2016-06-06 NOTE — Care Management Note (Addendum)
Case Management Note  Patient Details  Name: Alexis Ray MRN: [id number] Date of Birth: Sep 24, 1955  Subjective/Objective:  Met with patient at beside to discuss discharge planning. Patient lives at home and her daughter lives with her. She is frequently gone because she works. Patient only drives to get to the doctor. PCP is Dr. Arville Care at [facility]. She uses no assistive devices for ambulation. Chronic O2 at 3L through [place]. 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 Would benefit from nursing for disease management. No agency preference. Referral to [facility] for nursing.                  Action/Plan: Need Home Health order and face to face please. Requested from attending.   Expected Discharge Date:                  Expected Discharge Plan:  [place]  In-House Referral:     Discharge planning Services     Post Acute Care Choice:  Home Health Choice offered to:  Patient  DME Arranged:    DME Agency:     HH Arranged:  RN, Disease Management [place] Agency:  [facility]  Status of Service:  In process, will continue to follow  If discussed at Long Length of Stay Meetings, dates discussed:    Additional Comments:  Jolly [place], RN 06/06/2016, 3:47 PM

## 2016-06-06 NOTE — Care Management Important Message (Signed)
Important Message  Patient Details  Name: Alexis Ray MRN: [id number] Date of Birth: 1955-09-04   Medicare Important Message Given:  Yes    Marily MemosLisa M Eldrige Pitkin, RN 06/06/2016, 3:56 PM

## 2016-06-06 NOTE — Progress Notes (Signed)
Patient A+O with complaint of head ache. Per tele patient had a short period of accelerated idioventricular rhythm and vtach.  Spoke with Dr. Tobi BastosPyreddy. Obtain mag level and give coreg 3.125 2x daily.

## 2016-06-06 NOTE — Progress Notes (Signed)
Follow-up appointment at the Heart Failure Clinic is scheduled on June 12, 2016 at 11:30am. Of note, she did not show for a previous appointment on 06/05/16. Thank you.

## 2016-06-06 NOTE — Progress Notes (Addendum)
Patient blood sugar 515.Alexis Ray. Rechecked 474. MD paged per parameters.

## 2016-06-07 LAB — GLUCOSE, CAPILLARY
GLUCOSE-CAPILLARY: 384 mg/dL — AB (ref 65–99)
GLUCOSE-CAPILLARY: 389 mg/dL — AB (ref 65–99)
Glucose-Capillary: 351 mg/dL — ABNORMAL HIGH (ref 65–99)
Glucose-Capillary: 330 mg/dL — ABNORMAL HIGH (ref 65–99)

## 2016-06-07 LAB — HIV ANTIBODY (ROUTINE TESTING W REFLEX): HIV SCREEN 4TH GENERATION: NONREACTIVE

## 2016-06-07 MED ORDER — INSULIN ASPART 100 UNIT/ML ~~LOC~~ SOLN
6.0000 [IU] | Freq: Three times a day (TID) | SUBCUTANEOUS | Status: DC
  Administered 2016-06-07 – 2016-06-09 (×8): 6 [IU] via SUBCUTANEOUS
  Filled 2016-06-07 (×8): qty 6

## 2016-06-07 MED ORDER — PRAMIPEXOLE DIHYDROCHLORIDE 0.25 MG PO TABS
0.1250 mg | ORAL_TABLET | Freq: Every day | ORAL | Status: DC
  Administered 2016-06-07 – 2016-06-09 (×3): 0.125 mg via ORAL
  Filled 2016-06-07 (×3): qty 1

## 2016-06-07 MED ORDER — LIRAGLUTIDE 18 MG/3ML ~~LOC~~ SOPN
1.8000 mg | PEN_INJECTOR | Freq: Every day | SUBCUTANEOUS | Status: DC
Start: 2016-06-07 — End: 2016-06-08

## 2016-06-07 MED ORDER — VENLAFAXINE HCL ER 225 MG PO TB24: 225.0000 mg | ORAL_TABLET | Freq: Every day | ORAL | Status: DC

## 2016-06-07 MED ORDER — PREGABALIN 50 MG PO CAPS
100.0000 mg | ORAL_CAPSULE | Freq: Two times a day (BID) | ORAL | Status: DC
  Administered 2016-06-07 – 2016-06-09 (×4): 100 mg via ORAL
  Filled 2016-06-07 (×4): qty 2

## 2016-06-07 NOTE — Progress Notes (Signed)
[facility] - [place] at [facility]   PATIENT NAME: Alexis GaribaldiDebra Ray    MR#:  [id number]  DATE OF BIRTH:  02/22/1956  SUBJECTIVE:  CHIEF COMPLAINT:   Chief Complaint  Patient presents with  . Shortness of Breath   Feels weak. Cough with no sputum. Orthopnea. On 3 L oxygen at home.  REVIEW OF SYSTEMS:    Review of Systems  Constitutional: Positive for malaise/fatigue. Negative for chills and fever.  HENT: Negative for sore throat.   Eyes: Negative for blurred vision, double vision and pain.  Respiratory: Positive for cough, shortness of breath and wheezing. Negative for hemoptysis.   Cardiovascular: Positive for orthopnea. Negative for chest pain, palpitations and leg swelling.  Gastrointestinal: Negative for abdominal pain, constipation, diarrhea, heartburn, nausea and vomiting.  Genitourinary: Negative for dysuria and hematuria.  Musculoskeletal: Negative for back pain and joint pain.  Skin: Negative for rash.  Neurological: Positive for weakness. Negative for sensory change, speech change, focal weakness and headaches.  Endo/Heme/Allergies: Does not bruise/bleed easily.  Psychiatric/Behavioral: Negative for depression. The patient is not nervous/anxious.     DRUG ALLERGIES:   Allergies  Allergen Reactions  . Colchicine     itching  . Sulfa Antibiotics Rash    itching  . Levaquin [Levofloxacin In D5w] Rash    VITALS:  Blood pressure (!) 100/48, pulse 96, temperature 97.5 F (36.4 C), temperature source Oral, resp. rate 18, height 5\' 5"  (1.651 m), weight 82.6 kg (182 lb), SpO2 93 %.  PHYSICAL EXAMINATION:   Physical Exam  GENERAL:  61 y.o.-year-old patient lying in the bed. Morbidly obese EYES: Pupils equal, round, reactive to light and accommodation. No scleral icterus. Extraocular muscles intact.  HEENT: Head atraumatic, normocephalic. Oropharynx and nasopharynx clear.  NECK:  Supple, no jugular venous distention. No thyroid enlargement, no  tenderness.  LUNGS: bilateral wheezing and crackles  CARDIOVASCULAR: S1, S2 normal. No murmurs, rubs, or gallops.  ABDOMEN: Soft, nontender, nondistended. Bowel sounds present. No organomegaly or mass.  EXTREMITIES: No cyanosis, clubbing or edema b/l.    NEUROLOGIC: Cranial nerves II through XII are intact. No focal Motor or sensory deficits b/l.   PSYCHIATRIC: The patient is alert and oriented x 3.  SKIN: No obvious rash, lesion, or ulcer.   LABORATORY PANEL:   CBC  Recent Labs Lab 06/05/16 1845  WBC 7.6  HGB 13.7  HCT 41.6  PLT 239   ------------------------------------------------------------------------------------------------------------------ Chemistries   Recent Labs Lab 06/05/16 1845 06/06/16 0440  NA 134*  --   K 3.5  --   CL 98*  --   CO2 27  --   GLUCOSE 390*  --   BUN 16  --   CREATININE 0.94  --   CALCIUM 9.0  --   MG  --  1.9   ------------------------------------------------------------------------------------------------------------------  Cardiac Enzymes  Recent Labs Lab 06/06/16 0440  TROPONINI 0.06*   ------------------------------------------------------------------------------------------------------------------  RADIOLOGY:  Dg Chest 2 View  Result Date: 06/05/2016 CLINICAL DATA:  Productive cough. EXAM: CHEST  2 VIEW COMPARISON:  05/29/2016 FINDINGS: The cardiac silhouette is mildly enlarged. Mediastinal contours appear intact. Calcific atherosclerotic disease of the aorta. There is no evidence of pleural effusion or pneumothorax. Patchy airspace consolidation in the lingula and right middle lobe. Low lung volumes. Osseous structures are without acute abnormality. Soft tissues are grossly normal. IMPRESSION: Patchy airspace consolidation in lingula and right middle lobe. Given the distribution, atypical pneumonia is of primary consideration. Enlarged cardiac silhouette. Electronically Signed   By: Sharlyne Pacasobrinka  Dimitrova M.D.   On: 06/05/2016 18:43      ASSESSMENT AND PLAN:   This is a 61 y.o. female with a history of CAD, COPD, DM, chronic combined systolic and diastolic CHF now being admitted with:  * Right pneumonia with COPD exacerbation an acute on chronic respiratory failure On IV cefepime. MRSA PCR screen negative Started prednisone. Nebulizers. Wean oxygen as tolerated. Less wheezing today.  * Acute on chronic systolic CHF -  Lasix, Beta blockers - Input and Output - Counseled to limit fluids and Salt - Monitor Bun/Cr and Potassium  * History of CAD with elevated troponin - Cardiac monitoring - Troponins stable -Continue aspirin, Pletal, nitroglycerin, Crestor -due to demand ischemia. Not MI.  * diabetes mellitus Sliding scale insulin Victoza and Lantus.  * History of GERD -Continue Nexium  All the records are reviewed and case discussed with Care Management/Social Workerr. Management plans discussed with the patient, family and they are in agreement.  CODE STATUS: FULL CODE  DVT Prophylaxis: SCDs  TOTAL TIME TAKING CARE OF THIS PATIENT: 35 minutes.   POSSIBLE D/C IN 1-2 DAYS, DEPENDING ON CLINICAL CONDITION.  Milagros Loll R M.D on 06/07/2016 at 11:56 AM  Between 7am to 6pm - Pager - [phone]  After 6pm go to [email or web address] - password [email or web address] [facility]  [facility] [place] [facility]  Office  [phone]  CC: Primary care physician; Keane Police, MD  Note: This dictation was prepared with Dragon dictation along with smaller phrase technology. Any transcriptional errors that result from this process are unintentional.

## 2016-06-08 LAB — GLUCOSE, CAPILLARY
GLUCOSE-CAPILLARY: 154 mg/dL — AB (ref 65–99)
GLUCOSE-CAPILLARY: 373 mg/dL — AB (ref 65–99)
Glucose-Capillary: 279 mg/dL — ABNORMAL HIGH (ref 65–99)
Glucose-Capillary: 400 mg/dL — ABNORMAL HIGH (ref 65–99)

## 2016-06-08 LAB — BASIC METABOLIC PANEL
ANION GAP: 7 (ref 5–15)
BUN: 26 mg/dL — ABNORMAL HIGH (ref 6–20)
CO2: 29 mmol/L (ref 22–32)
Calcium: 8.6 mg/dL — ABNORMAL LOW (ref 8.9–10.3)
Chloride: 99 mmol/L — ABNORMAL LOW (ref 101–111)
Creatinine, Ser: 1.04 mg/dL — ABNORMAL HIGH (ref 0.44–1.00)
GFR calc Af Amer: >60 mL/min (ref >60)
GFR calc non Af Amer: 58 mL/min — ABNORMAL LOW (ref >60)
GLUCOSE: 279 mg/dL — AB (ref 65–99)
POTASSIUM: 3.6 mmol/L (ref 3.5–5.1)
Sodium: 135 mmol/L (ref 135–145)

## 2016-06-08 MED ORDER — SACUBITRIL-VALSARTAN 24-26 MG PO TABS
1.0000 | ORAL_TABLET | Freq: Two times a day (BID) | ORAL | Status: DC
  Administered 2016-06-09: 1 via ORAL
  Filled 2016-06-08: qty 1

## 2016-06-08 MED ORDER — INSULIN GLARGINE 100 UNIT/ML ~~LOC~~ SOLN
60.0000 [IU] | Freq: Every day | SUBCUTANEOUS | Status: DC
  Administered 2016-06-08: 60 [IU] via SUBCUTANEOUS
  Filled 2016-06-08 (×2): qty 0.6

## 2016-06-08 MED ORDER — FLUCONAZOLE 50 MG PO TABS
150.0000 mg | ORAL_TABLET | Freq: Once | ORAL | Status: AC
  Administered 2016-06-08: 150 mg via ORAL
  Filled 2016-06-08: qty 1

## 2016-06-08 MED ORDER — FUROSEMIDE 10 MG/ML IJ SOLN
60.0000 mg | Freq: Two times a day (BID) | INTRAMUSCULAR | Status: DC
Start: 2016-06-08 — End: 2016-06-09
  Administered 2016-06-08 – 2016-06-09 (×3): 60 mg via INTRAVENOUS
  Filled 2016-06-08 (×3): qty 6

## 2016-06-08 MED ORDER — POTASSIUM CHLORIDE CRYS ER 20 MEQ PO TBCR
40.0000 meq | EXTENDED_RELEASE_TABLET | Freq: Once | ORAL | Status: AC
  Administered 2016-06-08: 40 meq via ORAL
  Filled 2016-06-08: qty 2

## 2016-06-08 MED ORDER — ALUM & MAG HYDROXIDE-SIMETH 200-200-20 MG/5ML PO SUSP
15.0000 mL | ORAL | Status: DC | PRN
  Administered 2016-06-08: 15 mL via ORAL
  Filled 2016-06-08: qty 30

## 2016-06-08 NOTE — Progress Notes (Signed)
[facility] - [place] at [facility]   PATIENT NAME: Alexis GaribaldiDebra Ray    MR#:  [id number]  DATE OF BIRTH:  [birth year]  SUBJECTIVE:  CHIEF COMPLAINT:   Chief Complaint  Patient presents with  . Shortness of Breath   On 3 l home O2.  Admitted for CHF/PNA/COPD  Feels more wheezing today. Weak  REVIEW OF SYSTEMS:    Review of Systems  Constitutional: Positive for malaise/fatigue. Negative for chills and fever.  HENT: Negative for sore throat.   Eyes: Negative for blurred vision, double vision and pain.  Respiratory: Positive for cough, shortness of breath and wheezing. Negative for hemoptysis.   Cardiovascular: Positive for orthopnea. Negative for chest pain, palpitations and leg swelling.  Gastrointestinal: Negative for abdominal pain, constipation, diarrhea, heartburn, nausea and vomiting.  Genitourinary: Negative for dysuria and hematuria.  Musculoskeletal: Negative for back pain and joint pain.  Skin: Negative for rash.  Neurological: Positive for weakness. Negative for sensory change, speech change, focal weakness and headaches.  Endo/Heme/Allergies: Does not bruise/bleed easily.  Psychiatric/Behavioral: Negative for depression. The patient is not nervous/anxious.     DRUG ALLERGIES:   Allergies  Allergen Reactions  . Colchicine     itching  . Sulfa Antibiotics Rash    itching  . Levaquin [Levofloxacin In D5w] Rash    VITALS:  Blood pressure 115/64, pulse (!) 58, temperature 97.5 F (36.4 C), temperature source Oral, resp. rate 19, height 5\' 5"  (1.651 m), weight 83.8 kg (184 lb 11.2 oz), SpO2 93 %.  PHYSICAL EXAMINATION:   Physical Exam  GENERAL:  61 y.o.-year-old patient lying in the bed. Morbidly obese EYES: Pupils equal, round, reactive to light and accommodation. No scleral icterus. Extraocular muscles intact.  HEENT: Head atraumatic, normocephalic. Oropharynx and nasopharynx clear.  NECK:  Supple, no jugular venous distention. No thyroid  enlargement, no tenderness.  LUNGS: bilateral wheezing and crackles  CARDIOVASCULAR: S1, S2 normal. No murmurs, rubs, or gallops.  ABDOMEN: Soft, nontender, nondistended. Bowel sounds present. No organomegaly or mass.  EXTREMITIES: No cyanosis, clubbing or edema b/l.    NEUROLOGIC: Cranial nerves II through XII are intact. No focal Motor or sensory deficits b/l.   PSYCHIATRIC: The patient is alert and oriented x 3.  SKIN: No obvious rash, lesion, or ulcer.   LABORATORY PANEL:   CBC  Recent Labs Lab 06/05/16 1845  WBC 7.6  HGB 13.7  HCT 41.6  PLT 239   ------------------------------------------------------------------------------------------------------------------ Chemistries   Recent Labs Lab 06/06/16 0440 06/08/16 0608  NA  --  135  K  --  3.6  CL  --  99*  CO2  --  29  GLUCOSE  --  279*  BUN  --  26*  CREATININE  --  1.04*  CALCIUM  --  8.6*  MG 1.9  --    ------------------------------------------------------------------------------------------------------------------  Cardiac Enzymes  Recent Labs Lab 06/06/16 0440  TROPONINI 0.06*   ------------------------------------------------------------------------------------------------------------------  RADIOLOGY:  No results found.   ASSESSMENT AND PLAN:   This is a 61 y.o. female with a history of CAD, COPD, DM, chronic combined systolic and diastolic CHF now being admitted with:  * Right pneumonia with COPD exacerbation an acute on chronic respiratory failure On IV cefepime. MRSA PCR screen negative Started prednisone. Nebulizers. Wean oxygen as tolerated. Less wheezing today.  * Acute on chronic systolic CHF -  Lasix, Beta blockers - Input and Output - Counseled to limit fluids and Salt - Monitor Bun/Cr and Potassium Still has crackles on  exam. Will change to IV lasix Possibly can discharge home tomorrow with PO lasix  * History of CAD with elevated troponin - Troponins stable -Continue  aspirin, Pletal, nitroglycerin, Crestor -due to demand ischemia. Not MI.  * Diabetes mellitus Sliding scale insulin Victoza and Lantus. Increased dose of lantus due to steroids. Can restart home dose at discharge  * History of GERD -Continue Nexium  * Candida vulvovaginitis Fluconazole 150mg  x 1  All the records are reviewed and case discussed with Care Management/Social Workerr. Management plans discussed with the patient, family and they are in agreement.  CODE STATUS: FULL CODE  DVT Prophylaxis: SCDs  TOTAL TIME TAKING CARE OF THIS PATIENT: 35 minutes.   D/C in AM  Milagros Loll R M.D on 06/08/2016 at 8:22 AM  Between 7am to 6pm - Pager - [phone]  After 6pm go to [email or web address] - password [email or web address] [facility]  [facility] [place] [facility]  Office  [phone]  CC: Primary care physician; Keane Police, MD  Note: This dictation was prepared with Dragon dictation along with smaller phrase technology. Any transcriptional errors that result from this process are unintentional.

## 2016-06-08 NOTE — Evaluation (Signed)
Physical Therapy Evaluation Patient Details Name: Alexis Ray MRN: [id number] DOB: June 07, 1955 Today's Date: 06/08/2016   History of Present Illness  Alexis Ray is a 60yo black person identifying as female who comes to [facility] on 2/1 after 1D SOB, CP, desaturation on 3L, and productive phlegmy cough. PMH: CAD, COPD on 3L chronically, DM, CHF. Serial Tr-I p admission .07, .07, .06. in ther last 24H BG ranging from 244-515, most recent 279. Pt performs minimal AMB outside of the housedaily, but does not access the community regularly. She endorses 1 fall in 6 months. She lives with her daughter who assists with IADL. The pt is independnet in ADL at baseline.   Clinical Impression  Pt admitted with above diagnosis. Pt currently with functional limitations due to the deficits listed below (see "PT Problem List"). Upon entry, the patient is received up to chair, RN in room. The pt is awake and agreeable to participate. No acute distress noted at this time, pt only reporting some mild, nonconcerning pain. VSS during eval. The pt is alert and oriented x3, pleasant, conversational, and following simple and multi-step commands consistently.  Pt received on and remaining on 3L O2 throughout evaluation, with noted saturation of 96% upon entry and, 89% during AMB. Functional mobility assessment demonstrates mild weakness, the pt now requiring supervision for transfers and gait, whereas the patient performs these independently at baseline. Balance screening identifies some balance deficits, particularly in narrow stance.  Pt will benefit from skilled PT intervention to increase independence and safety with basic mobility in preparation for discharge to the venue listed below.       Follow Up Recommendations Home health PT    Equipment Recommendations  Rolling walker with 5" wheels    Recommendations for Other Services       Precautions / Restrictions Precautions Precautions: Fall Restrictions Weight  Bearing Restrictions: No      Mobility  Bed Mobility               General bed mobility comments: received in chair  Transfers Overall transfer level: Needs assistance Equipment used: Rolling walker (2 wheeled) Transfers: Sit to/from Stand Sit to Stand: Supervision            Ambulation/Gait   Ambulation Distance (Feet): 200 Feet Assistive device: Rolling walker (2 wheeled)       General Gait Details: 0.15m/s   Stairs            Wheelchair Mobility    Modified Rankin (Stroke Patients Only)       Balance Overall balance assessment: History of Falls;Needs assistance             Standing balance comment: fwd reach 10+", withstands trunk perturbation well; >3sec balance in narrow stance.                              Pertinent Vitals/Pain Pain Assessment: 0-10 Pain Score: 7  Pain Location: chest pain related to indigestion  Pain Intervention(s): Limited activity within patient's tolerance;Monitored during session    Home Living Family/patient expects to be discharged to:: Private residence Living Arrangements: Children Available Help at Discharge: Family   Home Access: Ramped entrance       Home Equipment: [place] Mor - single point      Prior Function Level of Independence: Independent with assistive device(s);Needs assistance   Gait / Transfers Assistance Needed: household distance AMB with SPC intermittently and 3l/min O2   ADL's /  Homemaking Assistance Needed: indep in ADL; daughter assists with IADL         Hand Dominance        Extremity/Trunk Assessment                Communication   Communication: No difficulties  Cognition Arousal/Alertness: Awake/alert Behavior During Therapy: WFL for tasks assessed/performed Overall Cognitive Status: Within Functional Limits for tasks assessed                      General Comments      Exercises     Assessment/Plan    PT Assessment Patient needs  continued PT services  PT Problem List Decreased range of motion;Decreased activity tolerance;Decreased knowledge of use of DME;Decreased balance;Decreased mobility;Decreased coordination;Pain;Obesity;Cardiopulmonary status limiting activity          PT Treatment Interventions Gait training;Functional mobility training;Therapeutic activities;Therapeutic exercise;Balance training;Patient/family education    PT Goals (Current goals can be found in the Care Plan section)  Acute Rehab PT Goals Patient Stated Goal: return to home, regain strength  PT Goal Formulation: With patient Time For Goal Achievement: 06/22/16 Potential to Achieve Goals: Good    Frequency Min 2X/week   Barriers to discharge Decreased caregiver support      Co-evaluation               End of Session Equipment Utilized During Treatment: Gait belt;Oxygen Activity Tolerance: Patient tolerated treatment well Patient left: in chair;with chair alarm set;with call bell/phone within reach Nurse Communication: Mobility status;Other (comment)         Time: 1011-1028 PT Time Calculation (min) (ACUTE ONLY): 17 min   Charges:   PT Evaluation $PT Eval Low Complexity: 1 Procedure PT Treatments $Therapeutic Activity: 8-22 mins   PT G Codes:       10:44 AM, 06/08/16 Alexis LintsAllan C Ray, PT, DPT Physical Therapist - [place] [phone](ASCOM)  [phone] (mobile)

## 2016-06-09 LAB — GLUCOSE, CAPILLARY
GLUCOSE-CAPILLARY: 195 mg/dL — AB (ref 65–99)
Glucose-Capillary: 269 mg/dL — ABNORMAL HIGH (ref 65–99)

## 2016-06-09 MED ORDER — PREDNISONE 50 MG PO TABS: ORAL_TABLET | ORAL | 0 refills | Status: DC

## 2016-06-09 MED ORDER — AMOXICILLIN-POT CLAVULANATE 875-125 MG PO TABS: 1.0000 | ORAL_TABLET | Freq: Two times a day (BID) | ORAL | 0 refills | Status: AC

## 2016-06-09 NOTE — Care Management Note (Signed)
Case Management Note  Patient Details  Name: Alexis Ray MRN: [id number] Date of Birth: 09-27-1955  Subjective/Objective:  Home Health nursing has been set up with [facility]. [facility] notified of discharge. Primary nurse updated.                   Action/Plan:   Expected Discharge Date:  06/09/16               Expected Discharge Plan:  Home w Home Health Services  In-House Referral:     Discharge planning Services     Post Acute Care Choice:  Home Health Choice offered to:  Patient  DME Arranged:    DME Agency:     HH Arranged:  RN, Disease Management HH Agency:  [facility]  Status of Service:  Completed, signed off  If discussed at Long Length of Stay Meetings, dates discussed:    Additional Comments:  Marily MemosLisa M Dhamar Gregory, RN 06/09/2016, 1:56 PM

## 2016-06-09 NOTE — Discharge Summary (Signed)
[facility] [place] [facility]   PATIENT NAME: Alexis Ray    MR#:  [id number]  DATE OF BIRTH:  [birth year]  DATE OF ADMISSION:  06/05/2016 ADMITTING PHYSICIAN: Tonye Royalty, DO  DATE OF DISCHARGE: 06/09/2016  PRIMARY CARE PHYSICIAN: Keane Police, MD    ADMISSION DIAGNOSIS:  Healthcare-associated pneumonia [J18.9]  DISCHARGE DIAGNOSIS:  Active Problems:   HCAP (healthcare-associated pneumonia)   SECONDARY DIAGNOSIS:   Past Medical History:  Diagnosis Date  . CAD (coronary artery disease)   . Chronic combined systolic and diastolic CHF (congestive heart failure) (HCC)   . COPD (chronic obstructive pulmonary disease) (HCC)   . Diabetes mellitus without complication (HCC)   . GERD (gastroesophageal reflux disease)   . Hypertension   . PVD (peripheral vascular disease) (HCC)   . Stroke (cerebrum) [facility])     HOSPITAL COURSE:   61 year old female with a history of COPD and chronic systolic heart failure ejection fraction of 30% on 3 L oxygen at home who presents with shortness of breath.  1. Acute on chronic systolic heart failure with ejection fraction 30% by echocardiogram 2017: Patient was diuresed with IV Lasix. Patient responded well. She will continue oral Lasix and Coreg. She has been referred to CHF clinic  2. Community-acquired pneumonia: Patient was on IV cefepime She has some crackles on the right side at discharge however her symptoms have improved.  3. Acute COPD exacerbation the setting of problem #1 and 2: Patient has no wheezing on examination. Patient will be on prednisone 50 mg for 5 more days and then will stop.  4. Diabetes: Patient will continue outpatient medications with ADA diet   DISCHARGE CONDITIONS AND DIET:   Stable for discharge on heart healthy and diabetic diet  CONSULTS OBTAINED:    DRUG ALLERGIES:   Allergies  Allergen Reactions  . Colchicine     itching  . Sulfa Antibiotics Rash    itching  .  Levaquin [Levofloxacin In D5w] Rash    DISCHARGE MEDICATIONS:   Current Discharge Medication List    START taking these medications   Details  amoxicillin-clavulanate (AUGMENTIN) 875-125 MG tablet Take 1 tablet by mouth 2 (two) times daily. Qty: 16 tablet, Refills: 0    predniSONE (DELTASONE) 50 MG tablet Take 1 tablet daily for 5 days then stop Qty: 5 tablet, Refills: 0      CONTINUE these medications which have NOT CHANGED   Details  albuterol (PROVENTIL) (2.5 MG/3ML) 0.083% nebulizer solution Take 3 mLs (2.5 mg total) by nebulization every 6 (six) hours as needed for wheezing or shortness of breath. Qty: 75 mL, Refills: 2    albuterol (VENTOLIN HFA) 108 (90 Base) MCG/ACT inhaler Inhale into the lungs every 6 (six) hours as needed for wheezing or shortness of breath.    aspirin EC 81 MG tablet Take 81 mg by mouth daily.    budesonide-formoterol (SYMBICORT) 160-4.5 MCG/ACT inhaler Inhale 2 puffs into the lungs 2 (two) times daily.    carvedilol (COREG) 3.125 MG tablet Take 1 tablet (3.125 mg total) by mouth 2 (two) times daily with a meal. Qty: 60 tablet, Refills: 0    Cholecalciferol 10000 units CAPS Take 1,000 Units by mouth daily.    cilostazol (PLETAL) 100 MG tablet Take 100 mg by mouth 2 (two) times daily.    esomeprazole (NEXIUM) 40 MG capsule Take 40 mg by mouth daily at 12 noon.    ferrous sulfate 325 (65 FE) MG tablet Take 325 mg by  mouth daily with breakfast.    furosemide (LASIX) 40 MG tablet Take 1 tablet (40 mg total) by mouth 2 (two) times daily. Qty: 30 tablet, Refills: 0    HYDROcodone-acetaminophen (NORCO/VICODIN) 5-325 MG tablet Take 1 tablet by mouth every 6 (six) hours as needed for moderate pain. Qty: 20 tablet, Refills: 0    insulin aspart (NOVOLOG) 100 UNIT/ML injection Inject 10 Units into the skin 3 (three) times daily with meals.    insulin glargine (LANTUS) 100 UNIT/ML injection Inject 40 Units into the skin at bedtime.    levocetirizine  (XYZAL) 5 MG tablet Take 5 mg by mouth at bedtime.     metFORMIN (GLUCOPHAGE) 500 MG tablet Take 500 mg by mouth 2 (two) times daily with a meal.     Multiple Vitamins-Minerals (SENTRY SENIOR) TABS Take 1 tablet by mouth daily.    nitroGLYCERIN (NITROSTAT) 0.4 MG SL tablet Place 0.4 mg under the tongue every 5 (five) minutes as needed for chest pain.    pramipexole (MIRAPEX) 0.125 MG tablet Take 1 tablet by mouth daily.    pregabalin (LYRICA) 150 MG capsule Take 150 mg by mouth 3 (three) times daily.     rosuvastatin (CRESTOR) 40 MG tablet Take 40 mg by mouth daily.    sacubitril-valsartan (ENTRESTO) 24-26 MG Take 1 tablet by mouth 2 (two) times daily. Qty: 60 tablet, Refills: 0    spironolactone (ALDACTONE) 50 MG tablet Take 50 mg by mouth daily.    tiotropium (SPIRIVA) 18 MCG inhalation capsule Place 18 mcg into inhaler and inhale daily.    triamcinolone cream (KENALOG) 0.1 % Apply 1 application topically 2 (two) times daily. To affected area    Venlafaxine HCl 225 MG TB24 Take 225 mg by mouth daily.     VICTOZA 18 MG/3ML SOPN Inject 1.8 mg into the skin daily.    vitamin C (ASCORBIC ACID) 500 MG tablet Take 500 mg by mouth daily.              Today   CHIEF COMPLAINT:   Patient doing very well this morning. She denies wheezing, cough or fevers.   VITAL SIGNS:  Blood pressure (!) 149/71, pulse 64, temperature 97.5 F (36.4 C), temperature source Oral, resp. rate 19, height 5\' 5"  (1.651 m), weight 83.6 kg (184 lb 4.9 oz), SpO2 96 %.   REVIEW OF SYSTEMS:  Review of Systems  Constitutional: Negative.  Negative for chills, fever and malaise/fatigue.  HENT: Negative.  Negative for ear discharge, ear pain, hearing loss, nosebleeds and sore throat.   Eyes: Negative.  Negative for blurred vision and pain.  Respiratory: Negative.  Negative for cough, hemoptysis, shortness of breath and wheezing.   Cardiovascular: Negative.  Negative for chest pain, palpitations and  leg swelling.  Gastrointestinal: Negative.  Negative for abdominal pain, blood in stool, diarrhea, nausea and vomiting.  Genitourinary: Negative.  Negative for dysuria.  Musculoskeletal: Negative.  Negative for back pain.  Skin: Negative.   Neurological: Negative for dizziness, tremors, speech change, focal weakness, seizures and headaches.  Endo/Heme/Allergies: Negative.  Does not bruise/bleed easily.  Psychiatric/Behavioral: Negative.  Negative for depression, hallucinations and suicidal ideas.     PHYSICAL EXAMINATION:  GENERAL:  61 y.o.-year-old patient lying in the bed with no acute distress.  NECK:  Supple, no jugular venous distention. No thyroid enlargement, no tenderness.  LUNGS: Right middle lobe crackles no wheezing, rales,rhonchi  No use of accessory muscles of respiration.  CARDIOVASCULAR: S1, S2 normal. No murmurs, rubs, or gallops.  ABDOMEN: Soft, non-tender, non-distended. Bowel sounds present. No organomegaly or mass.  EXTREMITIES: No pedal edema, cyanosis, or clubbing.  PSYCHIATRIC: The patient is alert and oriented x 3.  SKIN: No obvious rash, lesion, or ulcer.   DATA REVIEW:   CBC  Recent Labs Lab 06/05/16 1845  WBC 7.6  HGB 13.7  HCT 41.6  PLT 239    Chemistries   Recent Labs Lab 06/06/16 0440 06/08/16 0608  NA  --  135  K  --  3.6  CL  --  99*  CO2  --  29  GLUCOSE  --  279*  BUN  --  26*  CREATININE  --  1.04*  CALCIUM  --  8.6*  MG 1.9  --     Cardiac Enzymes  Recent Labs Lab 06/05/16 1845 06/06/16 0020 06/06/16 0440  TROPONINI 0.07* 0.07* 0.06*    Microbiology Results  @[email or web address] @  RADIOLOGY:  No results found.    Management plans discussed with the patient and she is in agreement. Stable for discharge home  Patient should follow up with pcp  CODE STATUS:     Code Status Orders        Start     Ordered   06/05/16 2320  Full code  Continuous     06/05/16 2319    Code Status History    Date Active Date  Inactive Code Status Order ID Comments User Context   05/29/2016  6:43 AM 05/30/2016  8:00 PM Full Code [id number]  Arnaldo Natal, MD ED   05/23/2015 10:56 PM 05/26/2015  7:47 PM Full Code [id number]  Oralia Manis, MD Inpatient      TOTAL TIME TAKING CARE OF THIS PATIENT: 37 minutes.    Note: This dictation was prepared with Dragon dictation along with smaller phrase technology. Any transcriptional errors that result from this process are unintentional.  Zebbie Ace M.D on 06/09/2016 at 12:41 PM  Between 7am to 6pm - Pager - [phone] After 6pm go to [email or web address] - Social research officer, government  [facility]  Office  [phone]  CC: Primary care physician; Keane Police, MD

## 2016-06-09 NOTE — Progress Notes (Signed)
Pt being discharged home today. PIV removed. Discharge instructions reviewed with pt and all questions answered. Her prescriptions were sent in to her pharmacy for pickup. Her follow up appointment has been made. She is leaving with all her belongings. Will be transported home via private vehicle.

## 2016-06-09 NOTE — Progress Notes (Signed)
Inpatient Diabetes Program Recommendations  AACE/ADA: New Consensus Statement on Inpatient Glycemic Control (2015)  Target Ranges:  Prepandial:   less than 140 mg/dL      Peak postprandial:   less than 180 mg/dL (1-2 hours)      Critically ill patients:  140 - 180 mg/dL   Review of Glycemic Control  Diabetes history: DM 2 Outpatient Diabetes medications: Lantus 40 units, Novolog 10 units TID meal coverage, Metformin 500 mg BID, Victoza 1.8 mg Daily Current orders for Inpatient glycemic control: Lantus 60 units, Novolog Moderate + HS scale, Novolog 6 units TID meal coverage  Inpatient Diabetes Program Recommendations:   Glucose elevated still. Patient takes a larger dose of meal coverage at home. Please consider increasing meal coverage to Novolog 10-12 units TID due to patient being on steroids.   Thanks,  Christena DeemShannon Breea Loncar RN, MSN, [facility] Inpatient Diabetes Coordinator Team Pager [phone] (8a-5p)

## 2016-06-09 NOTE — Care Management Important Message (Signed)
Important Message  Patient Details  Name: Alexis Ray MRN: [id number] Date of Birth: 07-29-1955   Medicare Important Message Given:  Yes    Marily MemosLisa M Destanee Bedonie, RN 06/09/2016, 2:28 PM

## 2016-06-09 NOTE — Progress Notes (Signed)
Pharmacy Antibiotic Note  Alexis Ray is a 61 y.o. female admitted on 06/05/2016 with PNA.  Pharmacy has been consulted for cefepime  Plan: Cefepime 1 g IV q8 hours.   Height: 5\' 5"  (165.1 cm) Weight: 184 lb 4.9 oz (83.6 kg) IBW/kg (Calculated) : 57  Temp (24hrs), Avg:97.9 F (36.6 C), Min:97.5 F (36.4 C), Max:98.5 F (36.9 C)   Recent Labs Lab 06/05/16 1845 06/08/16 0608  WBC 7.6  --   CREATININE 0.94 1.04*    Estimated Creatinine Clearance: 62.2 mL/min (by C-G formula based on SCr of 1.04 mg/dL (H)).    Allergies  Allergen Reactions  . Colchicine     itching  . Sulfa Antibiotics Rash    itching  . Levaquin [Levofloxacin In D5w] Rash   Thank you for allowing pharmacy to be a part of this patient's care.  Edia Pursifull D, Pharm.D., BCPS Clinical Pharmacist 06/09/2016 10:27 AM

## 2016-06-12 ENCOUNTER — Ambulatory Visit: Payer: Medicare HMO | Admitting: Family

## 2016-06-23 ENCOUNTER — Telehealth: Payer: Self-pay | Admitting: Family

## 2016-06-23 ENCOUNTER — Ambulatory Visit: Payer: Medicare HMO | Admitting: Family

## 2016-06-23 NOTE — Telephone Encounter (Signed)
Patient did not show for her Heart Failure Clinic appointment on 06/23/16. Will attempt to reschedule. This is the 2nd appointment that she has recently not shown up for.

## 2016-09-12 ENCOUNTER — Emergency Department: Payer: No Typology Code available for payment source

## 2016-09-12 ENCOUNTER — Encounter: Payer: Self-pay | Admitting: *Deleted

## 2016-09-12 ENCOUNTER — Emergency Department
Admission: EM | Admit: 2016-09-12 | Discharge: 2016-09-12 | Disposition: A | Discharge status: Home / Self Care | Payer: No Typology Code available for payment source | Attending: Emergency Medicine | Admitting: Emergency Medicine

## 2016-09-12 DIAGNOSIS — Y999 Unspecified external cause status: Secondary | ICD-10-CM | POA: Insufficient documentation

## 2016-09-12 DIAGNOSIS — Y92410 Unspecified street and highway as the place of occurrence of the external cause: Secondary | ICD-10-CM | POA: Insufficient documentation

## 2016-09-12 DIAGNOSIS — Z79899 Other long term (current) drug therapy: Secondary | ICD-10-CM | POA: Diagnosis not present

## 2016-09-12 DIAGNOSIS — I6782 Cerebral ischemia: Secondary | ICD-10-CM | POA: Diagnosis not present

## 2016-09-12 DIAGNOSIS — Z794 Long term (current) use of insulin: Secondary | ICD-10-CM | POA: Diagnosis not present

## 2016-09-12 DIAGNOSIS — Z7982 Long term (current) use of aspirin: Secondary | ICD-10-CM | POA: Insufficient documentation

## 2016-09-12 DIAGNOSIS — S199XXA Unspecified injury of neck, initial encounter: Secondary | ICD-10-CM | POA: Diagnosis present

## 2016-09-12 DIAGNOSIS — E119 Type 2 diabetes mellitus without complications: Secondary | ICD-10-CM | POA: Diagnosis not present

## 2016-09-12 DIAGNOSIS — F17210 Nicotine dependence, cigarettes, uncomplicated: Secondary | ICD-10-CM | POA: Diagnosis not present

## 2016-09-12 DIAGNOSIS — J449 Chronic obstructive pulmonary disease, unspecified: Secondary | ICD-10-CM | POA: Diagnosis not present

## 2016-09-12 DIAGNOSIS — M791 Myalgia: Secondary | ICD-10-CM | POA: Diagnosis not present

## 2016-09-12 DIAGNOSIS — I5041 Acute combined systolic (congestive) and diastolic (congestive) heart failure: Secondary | ICD-10-CM | POA: Insufficient documentation

## 2016-09-12 DIAGNOSIS — Y9389 Activity, other specified: Secondary | ICD-10-CM | POA: Insufficient documentation

## 2016-09-12 DIAGNOSIS — I10 Essential (primary) hypertension: Secondary | ICD-10-CM | POA: Diagnosis not present

## 2016-09-12 DIAGNOSIS — M7918 Myalgia, other site: Secondary | ICD-10-CM

## 2016-09-12 DIAGNOSIS — I2510 Atherosclerotic heart disease of native coronary artery without angina pectoris: Secondary | ICD-10-CM | POA: Diagnosis not present

## 2016-09-12 DIAGNOSIS — Z8673 Personal history of transient ischemic attack (TIA), and cerebral infarction without residual deficits: Secondary | ICD-10-CM | POA: Insufficient documentation
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
## 2017-09-03 ENCOUNTER — Encounter: Payer: Self-pay | Admitting: Emergency Medicine

## 2017-09-03 ENCOUNTER — Emergency Department: Payer: Medicare HMO

## 2017-09-03 ENCOUNTER — Other Ambulatory Visit: Payer: Self-pay

## 2017-09-03 ENCOUNTER — Emergency Department
Admission: EM | Admit: 2017-09-03 | Discharge: 2017-09-03 | Disposition: A | Discharge status: Home / Self Care | Payer: Medicare HMO | Attending: Emergency Medicine | Admitting: Emergency Medicine

## 2017-09-03 DIAGNOSIS — J449 Chronic obstructive pulmonary disease, unspecified: Secondary | ICD-10-CM | POA: Insufficient documentation

## 2017-09-03 DIAGNOSIS — R042 Hemoptysis: Secondary | ICD-10-CM | POA: Diagnosis not present

## 2017-09-03 DIAGNOSIS — Z79899 Other long term (current) drug therapy: Secondary | ICD-10-CM | POA: Insufficient documentation

## 2017-09-03 DIAGNOSIS — I110 Hypertensive heart disease with heart failure: Secondary | ICD-10-CM | POA: Diagnosis not present

## 2017-09-03 DIAGNOSIS — F17210 Nicotine dependence, cigarettes, uncomplicated: Secondary | ICD-10-CM | POA: Diagnosis not present

## 2017-09-03 DIAGNOSIS — I5042 Chronic combined systolic (congestive) and diastolic (congestive) heart failure: Secondary | ICD-10-CM | POA: Diagnosis not present

## 2017-09-03 DIAGNOSIS — Z7982 Long term (current) use of aspirin: Secondary | ICD-10-CM | POA: Insufficient documentation

## 2017-09-03 DIAGNOSIS — Z794 Long term (current) use of insulin: Secondary | ICD-10-CM | POA: Diagnosis not present

## 2017-09-03 DIAGNOSIS — Z8673 Personal history of transient ischemic attack (TIA), and cerebral infarction without residual deficits: Secondary | ICD-10-CM | POA: Insufficient documentation

## 2017-09-03 LAB — CBC
HEMATOCRIT: 41.9 % (ref 35.0–47.0)
HEMOGLOBIN: 13.8 g/dL (ref 12.0–16.0)
MCH: 28 pg (ref 26.0–34.0)
MCHC: 32.9 g/dL (ref 32.0–36.0)
MCV: 85.2 fL (ref 80.0–100.0)
Platelets: 222 10*3/uL (ref 150–440)
RBC: 4.92 MIL/uL (ref 3.80–5.20)
RDW: 15.9 % — ABNORMAL HIGH (ref 11.5–14.5)
WBC: 7.4 10*3/uL (ref 3.6–11.0)

## 2017-09-03 LAB — BASIC METABOLIC PANEL
ANION GAP: 9 (ref 5–15)
BUN: 21 mg/dL — ABNORMAL HIGH (ref 6–20)
CHLORIDE: 100 mmol/L — AB (ref 101–111)
CO2: 29 mmol/L (ref 22–32)
Calcium: 8.9 mg/dL (ref 8.9–10.3)
Creatinine, Ser: 1.5 mg/dL — ABNORMAL HIGH (ref 0.44–1.00)
GFR calc non Af Amer: 36 mL/min — ABNORMAL LOW (ref >60)
GFR, EST AFRICAN AMERICAN: 42 mL/min — AB (ref >60)
Glucose, Bld: 131 mg/dL — ABNORMAL HIGH (ref 65–99)
POTASSIUM: 3.7 mmol/L (ref 3.5–5.1)
Sodium: 138 mmol/L (ref 135–145)

## 2017-09-03 MED ORDER — IOPAMIDOL (ISOVUE-370) INJECTION 76%
60.0000 mL | Freq: Once | INTRAVENOUS | Status: AC | PRN
  Administered 2017-09-03: 60 mL via INTRAVENOUS

## 2017-09-03 NOTE — ED Provider Notes (Signed)
[facility] Emergency Department Provider Note   ____________________________________________   I have reviewed the triage vital signs and the nursing notes.   HISTORY  Chief Complaint Hemoptysis   History limited by: Not Limited   HPI Alexis Ray is a 62 y.o. female who presents to the emergency department today because of concerns for coughing up blood. The patient states that she started developing a cough today. She notes it is productive of phlegm. There was blood in the phlegm. She has had some associated shortness breath. In addition the patient is complaining of central chest pain. This however has been going on for the past 6 months. She denies any recent fevers. Denies any history of blood disorders. Does state she smokes. Has a history of COPD.  Per medical record review patient has a history of DM, stroke, CAD, COPD.   Past Medical History:  Diagnosis Date  . CAD (coronary artery disease)   . Chronic combined systolic and diastolic CHF (congestive heart failure) (HCC)   . COPD (chronic obstructive pulmonary disease) (HCC)   . Diabetes mellitus without complication (HCC)   . GERD (gastroesophageal reflux disease)   . Hypertension   . PVD (peripheral vascular disease) (HCC)   . Stroke (cerebrum) [facility]     Patient Active Problem List   Diagnosis Date Noted  . HCAP (healthcare-associated pneumonia) 06/05/2016  . Acute on chronic systolic CHF (congestive heart failure), NYHA class 4 (HCC) 05/29/2016  . Chronic systolic heart failure (HCC) 06/07/2015  . Numbness in both legs 06/07/2015  . Tobacco use 06/07/2015  . GERD (gastroesophageal reflux disease) 05/23/2015  . Type 2 diabetes mellitus (HCC) 05/23/2015  . HTN (hypertension) 05/23/2015  . PVD (peripheral vascular disease) (HCC) 05/23/2015  . OSA on CPAP 05/23/2015    Past Surgical History:  Procedure Laterality Date  . ABDOMINAL HYSTERECTOMY    . CARDIAC CATHETERIZATION    .  COLONOSCOPY    . EYE SURGERY Bilateral     Prior to Admission medications   Medication Sig Start Date End Date Taking? Authorizing Provider  albuterol (PROVENTIL) (2.5 MG/3ML) 0.083% nebulizer solution Take 3 mLs (2.5 mg total) by nebulization every 6 (six) hours as needed for wheezing or shortness of breath. 05/26/15   Altamese Dilling, MD  albuterol (VENTOLIN HFA) 108 (90 Base) MCG/ACT inhaler Inhale into the lungs every 6 (six) hours as needed for wheezing or shortness of breath.    [provider]  aspirin EC 81 MG tablet Take 81 mg by mouth daily.    [provider]  budesonide-formoterol (SYMBICORT) 160-4.5 MCG/ACT inhaler Inhale 2 puffs into the lungs 2 (two) times daily.    [provider]  carvedilol (COREG) 3.125 MG tablet Take 1 tablet (3.125 mg total) by mouth 2 (two) times daily with a meal. 05/30/16   Altamese Dilling, MD  Cholecalciferol 10000 units CAPS Take 1,000 Units by mouth daily.    [provider]  cilostazol (PLETAL) 100 MG tablet Take 100 mg by mouth 2 (two) times daily.    [provider]  esomeprazole (NEXIUM) 40 MG capsule Take 40 mg by mouth daily at 12 noon.    [provider]  ferrous sulfate 325 (65 FE) MG tablet Take 325 mg by mouth daily with breakfast.    [provider]  furosemide (LASIX) 40 MG tablet Take 1 tablet (40 mg total) by mouth 2 (two) times daily. 05/30/16   Altamese Dilling, MD  HYDROcodone-acetaminophen (NORCO/VICODIN) 5-325 MG tablet  Take 1 tablet by mouth every 6 (six) hours as needed for moderate pain. 05/30/16   Altamese Dilling, MD  insulin aspart (NOVOLOG) 100 UNIT/ML injection Inject 10 Units into the skin 3 (three) times daily with meals.    [provider]  insulin glargine (LANTUS) 100 UNIT/ML injection Inject 40 Units into the skin at bedtime.    [provider]  levocetirizine (XYZAL) 5 MG tablet Take 5 mg by mouth at bedtime.      [provider]  metFORMIN (GLUCOPHAGE) 500 MG tablet Take 500 mg by mouth 2 (two) times daily with a meal.     [provider]  Multiple Vitamins-Minerals (SENTRY SENIOR) TABS Take 1 tablet by mouth daily.    [provider]  nitroGLYCERIN (NITROSTAT) 0.4 MG SL tablet Place 0.4 mg under the tongue every 5 (five) minutes as needed for chest pain.    [provider]  pramipexole (MIRAPEX) 0.125 MG tablet Take 1 tablet by mouth daily. 03/12/16   [provider]  predniSONE (DELTASONE) 50 MG tablet Take 1 tablet daily for 5 days then stop 06/10/16   Adrian Saran, MD  pregabalin (LYRICA) 150 MG capsule Take 150 mg by mouth 3 (three) times daily.     [provider]  rosuvastatin (CRESTOR) 40 MG tablet Take 40 mg by mouth daily.    [provider]  sacubitril-valsartan (ENTRESTO) 24-26 MG Take 1 tablet by mouth 2 (two) times daily. 07/12/15   Delma Freeze, FNP  spironolactone (ALDACTONE) 50 MG tablet Take 50 mg by mouth daily.    [provider]  tiotropium (SPIRIVA) 18 MCG inhalation capsule Place 18 mcg into inhaler and inhale daily.    [provider]  triamcinolone cream (KENALOG) 0.1 % Apply 1 application topically 2 (two) times daily. To affected area 05/06/16   [provider]  Venlafaxine HCl 225 MG TB24 Take 225 mg by mouth daily.     [provider]  VICTOZA 18 MG/3ML SOPN Inject 1.8 mg into the skin daily. 05/27/16   [provider]  vitamin C (ASCORBIC ACID) 500 MG tablet Take 500 mg by mouth daily.    [provider]    Allergies Colchicine; Sulfa antibiotics; and Levaquin [levofloxacin in d5w]  Family History  Problem Relation Age of Onset  . Diabetes Mother   . Cancer Father   . Diabetes Maternal Aunt   . Diabetes Maternal Aunt     Social History Social History   Tobacco Use  . Smoking status: Current Every Day Smoker    Packs/day: 1.00    Types: Cigarettes  .  Smokeless tobacco: Never Used  Substance Use Topics  . Alcohol use: No    Alcohol/week: 0.0 oz  . Drug use: No    Review of Systems Constitutional: No fever/chills Eyes: No visual changes. ENT: No sore throat. Cardiovascular: Positive for chest pain. Respiratory: Positive for shortness of breath and cough Gastrointestinal: No abdominal pain.  No nausea, no vomiting.  No diarrhea.   Genitourinary: Negative for dysuria. Musculoskeletal: Negative for back pain. Skin: Negative for rash. Neurological: Negative for headaches, focal weakness or numbness.  ____________________________________________   PHYSICAL EXAM:  VITAL SIGNS: ED Triage Vitals  Enc Vitals Group     BP 09/03/17 1711 111/62     Pulse Rate 09/03/17 1711 (!) 101     Resp 09/03/17 1711 20     Temp 09/03/17 1711 98.5 F (36.9 C)     Temp Source  09/03/17 1711 Oral     SpO2 09/03/17 1711 94 %     Weight 09/03/17 1712 190 lb (86.2 kg)     Height 09/03/17 1712  (1.651 m)     Head Circumference --      Peak Flow --      Pain Score 09/03/17 1712 8   Constitutional: Alert and oriented. Well appearing and in no distress. Eyes: Conjunctivae are normal.  ENT   Head: Normocephalic and atraumatic.   Nose: No congestion/rhinnorhea.   Mouth/Throat: Mucous membranes are moist.   Neck: No stridor. Hematological/Lymphatic/Immunilogical: No cervical lymphadenopathy. Cardiovascular: Normal rate, regular rhythm.  No murmurs, rubs, or gallops.  Respiratory: Normal respiratory effort without tachypnea nor retractions. Breath sounds are clear and equal bilaterally. No wheezes/rales/rhonchi. Gastrointestinal: Soft and non tender. No rebound. No guarding.  Genitourinary: Deferred Musculoskeletal: Normal range of motion in all extremities. No lower extremity edema. Neurologic:  Normal speech and language. No gross focal neurologic deficits are appreciated.  Skin:  Skin is warm, dry and intact. No rash  noted. Psychiatric: Mood and affect are normal. Speech and behavior are normal. Patient exhibits appropriate insight and judgment.  ____________________________________________    LABS (pertinent positives/negatives)  CBC wbc 7.4, hgb 13.8, plt 222 BMP na 138, k 3.7, glu 131, cr 1.50  ____________________________________________   EKG  I, Phineas Semen, attending physician, personally viewed and interpreted this EKG  EKG Time: 1718 Rate: 105 Rhythm: sinus tachycardia Axis: right axis deviation Intervals: qtc 486 QRS: narrow, q wave v1 ST changes: no st elevation Impression: abnormal ekg   ____________________________________________    RADIOLOGY  CXR Question edema and atelectasis  CT angio No PE, no pneumonia.   ____________________________________________   PROCEDURES  Procedures  ____________________________________________   INITIAL IMPRESSION / ASSESSMENT AND PLAN / ED COURSE  Pertinent labs & imaging results that were available during my care of the patient were reviewed by me and considered in my medical decision making (see chart for details).  Patient with hemoptysis. Ddx would be broad including cancer, infection, bronchiectasis, bronchitis amongst other etiologies. Work up without any concerning findings. Think bronchitis/copd likely cause. Discussed findings with patient.  ____________________________________________   FINAL CLINICAL IMPRESSION(S) / ED DIAGNOSES  Final diagnoses:  Hemoptysis     Note: This dictation was prepared with Dragon dictation. Any transcriptional errors that result from this process are unintentional     Phineas Semen, MD 09/04/17 [phone]

## 2017-09-03 NOTE — Discharge Instructions (Addendum)
Please seek medical attention for any high fevers, chest pain, shortness of breath, change in behavior, persistent vomiting, bloody stool or any other new or concerning symptoms.  

## 2017-09-03 NOTE — ED Triage Notes (Signed)
Pt states has had cough since yesterday, states today woke up and noted blood tinged sputum. Pt also c/o pain in her chest with coughing, pt is worse with deep inspiration.

## 2018-01-17 ENCOUNTER — Other Ambulatory Visit: Payer: Self-pay

## 2018-01-17 ENCOUNTER — Encounter: Payer: Self-pay | Admitting: Emergency Medicine

## 2018-01-17 ENCOUNTER — Emergency Department
Admission: EM | Admit: 2018-01-17 | Discharge: 2018-01-17 | Disposition: A | Discharge status: Home / Self Care | Payer: Medicare HMO | Attending: Emergency Medicine | Admitting: Emergency Medicine

## 2018-01-17 ENCOUNTER — Emergency Department: Payer: Medicare HMO

## 2018-01-17 DIAGNOSIS — B3781 Candidal esophagitis: Secondary | ICD-10-CM | POA: Diagnosis not present

## 2018-01-17 DIAGNOSIS — Z79899 Other long term (current) drug therapy: Secondary | ICD-10-CM | POA: Insufficient documentation

## 2018-01-17 DIAGNOSIS — E119 Type 2 diabetes mellitus without complications: Secondary | ICD-10-CM | POA: Insufficient documentation

## 2018-01-17 DIAGNOSIS — R079 Chest pain, unspecified: Secondary | ICD-10-CM | POA: Diagnosis present

## 2018-01-17 DIAGNOSIS — Z794 Long term (current) use of insulin: Secondary | ICD-10-CM | POA: Insufficient documentation

## 2018-01-17 DIAGNOSIS — F17210 Nicotine dependence, cigarettes, uncomplicated: Secondary | ICD-10-CM | POA: Insufficient documentation

## 2018-01-17 DIAGNOSIS — I110 Hypertensive heart disease with heart failure: Secondary | ICD-10-CM | POA: Insufficient documentation

## 2018-01-17 DIAGNOSIS — Z7982 Long term (current) use of aspirin: Secondary | ICD-10-CM | POA: Insufficient documentation

## 2018-01-17 DIAGNOSIS — I5042 Chronic combined systolic (congestive) and diastolic (congestive) heart failure: Secondary | ICD-10-CM | POA: Diagnosis not present

## 2018-01-17 DIAGNOSIS — I2510 Atherosclerotic heart disease of native coronary artery without angina pectoris: Secondary | ICD-10-CM | POA: Diagnosis not present

## 2018-01-17 DIAGNOSIS — J441 Chronic obstructive pulmonary disease with (acute) exacerbation: Secondary | ICD-10-CM | POA: Diagnosis not present

## 2018-01-17 LAB — BASIC METABOLIC PANEL
ANION GAP: 9 (ref 5–15)
BUN: 12 mg/dL (ref 8–23)
CALCIUM: 8.3 mg/dL — AB (ref 8.9–10.3)
CO2: 30 mmol/L (ref 22–32)
Chloride: 101 mmol/L (ref 98–111)
Creatinine, Ser: 1.27 mg/dL — ABNORMAL HIGH (ref 0.44–1.00)
GFR calc Af Amer: 52 mL/min — ABNORMAL LOW (ref >60)
GFR, EST NON AFRICAN AMERICAN: 45 mL/min — AB (ref >60)
GLUCOSE: 209 mg/dL — AB (ref 70–99)
Potassium: 3.6 mmol/L (ref 3.5–5.1)
Sodium: 140 mmol/L (ref 135–145)

## 2018-01-17 LAB — BRAIN NATRIURETIC PEPTIDE: B NATRIURETIC PEPTIDE 5: 273 pg/mL — AB (ref 0.0–100.0)

## 2018-01-17 LAB — HEPATIC FUNCTION PANEL
ALBUMIN: 3.2 g/dL — AB (ref 3.5–5.0)
ALT: 13 U/L (ref 0–44)
AST: 19 U/L (ref 15–41)
Alkaline Phosphatase: 43 U/L (ref 38–126)
Total Bilirubin: 0.3 mg/dL (ref 0.3–1.2)
Total Protein: 6.7 g/dL (ref 6.5–8.1)

## 2018-01-17 LAB — CBC
HCT: 41.1 % (ref 35.0–47.0)
Hemoglobin: 14 g/dL (ref 12.0–16.0)
MCH: 29.3 pg (ref 26.0–34.0)
MCHC: 34.2 g/dL (ref 32.0–36.0)
MCV: 85.8 fL (ref 80.0–100.0)
Platelets: 158 10*3/uL (ref 150–440)
RBC: 4.79 MIL/uL (ref 3.80–5.20)
RDW: 19 % — AB (ref 11.5–14.5)
WBC: 6.7 10*3/uL (ref 3.6–11.0)

## 2018-01-17 LAB — TROPONIN I
TROPONIN I: 0.1 ng/mL — AB (ref <0.03)
Troponin I: 0.1 ng/mL (ref <0.03)

## 2018-01-17 MED ORDER — PREDNISONE 20 MG PO TABS: 40.0000 mg | ORAL_TABLET | Freq: Every day | ORAL | 0 refills | Status: DC

## 2018-01-17 MED ORDER — AZITHROMYCIN 500 MG PO TABS
500.0000 mg | ORAL_TABLET | Freq: Once | ORAL | Status: AC
  Administered 2018-01-17: 500 mg via ORAL
  Filled 2018-01-17: qty 1

## 2018-01-17 MED ORDER — IPRATROPIUM-ALBUTEROL 0.5-2.5 (3) MG/3ML IN SOLN
9.0000 mL | Freq: Once | RESPIRATORY_TRACT | Status: AC
  Administered 2018-01-17: 9 mL via RESPIRATORY_TRACT
  Filled 2018-01-17: qty 9

## 2018-01-17 MED ORDER — FLUCONAZOLE 100 MG PO TABS: 100.0000 mg | ORAL_TABLET | Freq: Every day | ORAL | 0 refills | Status: AC

## 2018-01-17 MED ORDER — FLUCONAZOLE 100 MG PO TABS
100.0000 mg | ORAL_TABLET | Freq: Once | ORAL | Status: AC
  Administered 2018-01-17: 100 mg via ORAL
  Filled 2018-01-17: qty 1

## 2018-01-17 MED ORDER — PREDNISONE 20 MG PO TABS
60.0000 mg | ORAL_TABLET | Freq: Once | ORAL | Status: AC
  Administered 2018-01-17: 60 mg via ORAL
  Filled 2018-01-17: qty 3

## 2018-01-17 MED ORDER — AZITHROMYCIN 250 MG PO TABS: ORAL_TABLET | ORAL | 0 refills | Status: AC

## 2018-01-17 NOTE — ED Provider Notes (Signed)
[facility] Emergency Department Provider Note  ____________________________________________   First MD Initiated Contact with Patient 01/17/18 1316     (approximate)  I have reviewed the triage vital signs and the nursing notes.   HISTORY  Chief Complaint Chest Pain   HPI Alexis Ray is a 62 y.o. female with history of CHF as well as COPD who was presented to the emergency department chest pain shortness of breath worsening over the past month.  Says that the pain is been constant to the left side of her chest radiating into the left axilla.  She says the pain is a pressure-like pain.  Says that she also has associated shortness of breath with walking.  Says that recently she also had an endoscopy which revealed Candida-like plaques in the middle and distal third of the esophagus.  She says that the GI doctor was going to refer to her primary care doctor for treatment but she has not heard from primary care doctor and is unmedicated at this time.  Patient also reports dry cough.  No fever.  Past Medical History:  Diagnosis Date  . CAD (coronary artery disease)   . Chronic combined systolic and diastolic CHF (congestive heart failure) (HCC)   . COPD (chronic obstructive pulmonary disease) (HCC)   . Diabetes mellitus without complication (HCC)   . GERD (gastroesophageal reflux disease)   . Hypertension   . PVD (peripheral vascular disease) (HCC)   . Stroke (cerebrum) [facility])     Patient Active Problem List   Diagnosis Date Noted  . HCAP (healthcare-associated pneumonia) 06/05/2016  . Acute on chronic systolic CHF (congestive heart failure), NYHA class 4 (HCC) 05/29/2016  . Chronic systolic heart failure (HCC) 06/07/2015  . Numbness in both legs 06/07/2015  . Tobacco use 06/07/2015  . GERD (gastroesophageal reflux disease) 05/23/2015  . Type 2 diabetes mellitus (HCC) 05/23/2015  . HTN (hypertension) 05/23/2015  . PVD (peripheral vascular disease)  (HCC) 05/23/2015  . OSA on CPAP 05/23/2015    Past Surgical History:  Procedure Laterality Date  . ABDOMINAL HYSTERECTOMY    . CARDIAC CATHETERIZATION    . COLONOSCOPY    . EYE SURGERY Bilateral     Prior to Admission medications   Medication Sig Start Date End Date Taking? Authorizing Provider  albuterol (PROVENTIL) (2.5 MG/3ML) 0.083% nebulizer solution Take 3 mLs (2.5 mg total) by nebulization every 6 (six) hours as needed for wheezing or shortness of breath. 05/26/15   Altamese Dilling, MD  albuterol (VENTOLIN HFA) 108 (90 Base) MCG/ACT inhaler Inhale into the lungs every 6 (six) hours as needed for wheezing or shortness of breath.    [provider]  aspirin EC 81 MG tablet Take 81 mg by mouth daily.    [provider]  budesonide-formoterol (SYMBICORT) 160-4.5 MCG/ACT inhaler Inhale 2 puffs into the lungs 2 (two) times daily.    [provider]  carvedilol (COREG) 3.125 MG tablet Take 1 tablet (3.125 mg total) by mouth 2 (two) times daily with a meal. 05/30/16   Altamese Dilling, MD  Cholecalciferol 10000 units CAPS Take 1,000 Units by mouth daily.    [provider]  cilostazol (PLETAL) 100 MG tablet Take 100 mg by mouth 2 (two) times daily.    [provider]  esomeprazole (NEXIUM) 40 MG capsule Take 40 mg by mouth daily at 12 noon.    [provider]  ferrous sulfate 325 (65 FE) MG tablet Take 325 mg by mouth daily  with breakfast.    [provider]  furosemide (LASIX) 40 MG tablet Take 1 tablet (40 mg total) by mouth 2 (two) times daily. 05/30/16   Altamese Dilling, MD  HYDROcodone-acetaminophen (NORCO/VICODIN) 5-325 MG tablet Take 1 tablet by mouth every 6 (six) hours as needed for moderate pain. 05/30/16   Altamese Dilling, MD  insulin aspart (NOVOLOG) 100 UNIT/ML injection Inject 10 Units into the skin 3 (three) times daily with meals.    [provider]  insulin glargine (LANTUS) 100  UNIT/ML injection Inject 40 Units into the skin at bedtime.    [provider]  levocetirizine (XYZAL) 5 MG tablet Take 5 mg by mouth at bedtime.     [provider]  metFORMIN (GLUCOPHAGE) 500 MG tablet Take 500 mg by mouth 2 (two) times daily with a meal.     [provider]  Multiple Vitamins-Minerals (SENTRY SENIOR) TABS Take 1 tablet by mouth daily.    [provider]  nitroGLYCERIN (NITROSTAT) 0.4 MG SL tablet Place 0.4 mg under the tongue every 5 (five) minutes as needed for chest pain.    [provider]  pramipexole (MIRAPEX) 0.125 MG tablet Take 1 tablet by mouth daily. 03/12/16   [provider]  predniSONE (DELTASONE) 50 MG tablet Take 1 tablet daily for 5 days then stop 06/10/16   Adrian Saran, MD  pregabalin (LYRICA) 150 MG capsule Take 150 mg by mouth 3 (three) times daily.     [provider]  rosuvastatin (CRESTOR) 40 MG tablet Take 40 mg by mouth daily.    [provider]  sacubitril-valsartan (ENTRESTO) 24-26 MG Take 1 tablet by mouth 2 (two) times daily. 07/12/15   Delma Freeze, FNP  spironolactone (ALDACTONE) 50 MG tablet Take 50 mg by mouth daily.    [provider]  tiotropium (SPIRIVA) 18 MCG inhalation capsule Place 18 mcg into inhaler and inhale daily.    [provider]  triamcinolone cream (KENALOG) 0.1 % Apply 1 application topically 2 (two) times daily. To affected area 05/06/16   [provider]  Venlafaxine HCl 225 MG TB24 Take 225 mg by mouth daily.     [provider]  VICTOZA 18 MG/3ML SOPN Inject 1.8 mg into the skin daily. 05/27/16   [provider]  vitamin C (ASCORBIC ACID) 500 MG tablet Take 500 mg by mouth daily.    [provider]    Allergies Colchicine; Sulfa antibiotics; and Levaquin [levofloxacin in d5w]  Family History  Problem Relation Age of Onset  . Diabetes Mother   . Cancer Father   . Diabetes Maternal Aunt   . Diabetes  Maternal Aunt     Social History Social History   Tobacco Use  . Smoking status: Current Every Day Smoker    Packs/day: 1.00    Types: Cigarettes  . Smokeless tobacco: Never Used  Substance Use Topics  . Alcohol use: No    Alcohol/week: 0.0 standard drinks  . Drug use: No    Review of Systems  Constitutional: No fever/chills Eyes: No visual changes. ENT: No sore throat. Cardiovascular: As above Respiratory: As above Gastrointestinal: No abdominal pain.  No nausea, no vomiting.  No diarrhea.  No constipation. Genitourinary: Negative for dysuria. Musculoskeletal: Negative for back pain. Skin: Negative for rash. Neurological: Negative for headaches, focal weakness or numbness.   ____________________________________________   PHYSICAL EXAM:  VITAL SIGNS: ED Triage Vitals  Enc Vitals Group     BP 01/17/18 1230 99/65  Pulse Rate 01/17/18 1227 81     Resp 01/17/18 1227 20     Temp 01/17/18 1227 97.8 F (36.6 C)     Temp Source 01/17/18 1227 Oral     SpO2 01/17/18 1227 96 %     Weight 01/17/18 1231 201 lb (91.2 kg)     Height 01/17/18 1231 5\' 3"  (1.6 m)     Head Circumference --      Peak Flow --      Pain Score 01/17/18 1229 10     Pain Loc --      Pain Edu? --      Excl. in GC? --     Constitutional: Alert and oriented. Well appearing and in no acute distress. Eyes: Conjunctivae are normal.  Head: Atraumatic. Nose: No congestion/rhinnorhea. Mouth/Throat: Mucous membranes are moist.  Neck: No stridor.   Cardiovascular: Normal rate, regular rhythm. Grossly normal heart sounds.  Chest pain reproducible over the left chest anterior wall. Respiratory: Normal respiratory effort.  No retractions.  Decreased breath sounds throughout with coarse wheezing greater on the left than the right side. Gastrointestinal: Soft and nontender. No distention. No CVA tenderness. Musculoskeletal: No lower extremity tenderness nor edema.  No joint effusions. Neurologic:  Normal  speech and language. No gross focal neurologic deficits are appreciated. Skin:  Skin is warm, dry and intact. No rash noted. Psychiatric: Mood and affect are normal. Speech and behavior are normal.  ____________________________________________   LABS (all labs ordered are listed, but only abnormal results are displayed)  Labs Reviewed  BASIC METABOLIC PANEL - Abnormal; Notable for the following components:      Result Value   Glucose, Bld 209 (*)    Creatinine, Ser 1.27 (*)    Calcium 8.3 (*)    GFR calc non Af Amer 45 (*)    GFR calc Af Amer 52 (*)    All other components within normal limits  CBC - Abnormal; Notable for the following components:   RDW 19.0 (*)    All other components within normal limits  TROPONIN I - Abnormal; Notable for the following components:   Troponin I 0.10 (*)    All other components within normal limits  HEPATIC FUNCTION PANEL - Abnormal; Notable for the following components:   Albumin 3.2 (*)    All other components within normal limits  BRAIN NATRIURETIC PEPTIDE - Abnormal; Notable for the following components:   B Natriuretic Peptide 273.0 (*)    All other components within normal limits  TROPONIN I - Abnormal; Notable for the following components:   Troponin I 0.10 (*)    All other components within normal limits   ____________________________________________  EKG  ED ECG REPORT I, Arelia Longestavid M Schaevitz, the attending physician, personally viewed and interpreted this ECG.   Date: 01/17/2018  EKG Time: 1216  Rate: 89  Rhythm: normal sinus rhythm  Axis: Normal  Intervals:none  ST&T Change: No ST segment elevation or depression.  T wave inversions in 1 and aVL.  No significant change from previous.  ED ECG REPORT I, Arelia Longestavid M Schaevitz, the attending physician, personally viewed and interpreted this ECG.   Date: 01/17/2018  EKG Time: 1339  Rate: 78  Rhythm: normal sinus rhythm  Axis: Normal  Intervals:none  ST&T Change: No ST segment  elevation or depression.  T wave inversions in 1 and aVL.  Also with inversions in V6 with biphasic T waves in V3 through 5.  Unchanged from previous EKGs on the record.  ____________________________________________  RADIOLOGY  No acute finding on the chest x-ray. ____________________________________________   PROCEDURES  Procedure(s) performed:   Procedures  Critical Care performed:   ____________________________________________   INITIAL IMPRESSION / ASSESSMENT AND PLAN / ED COURSE  Pertinent labs & imaging results that were available during my care of the patient were reviewed by me and considered in my medical decision making (see chart for details).  Differential diagnosis includes, but is not limited to, ACS, aortic dissection, pulmonary embolism, cardiac tamponade, pneumothorax, pneumonia, pericarditis, myocarditis, GI-related causes including esophagitis/gastritis, and musculoskeletal chest wall pain.  Candidal esophagitis As part of my medical decision making, I reviewed the following data within the electronic MEDICAL RECORD NUMBERReviewed outpatient records including patient's recent endoscopy.  ----------------------------------------- 2:30 PM on 01/17/2018 -----------------------------------------  Nurse printed out rhythm strip which showed wide-complex rhythm on the monitor for several minutes.  Rate of approximately 79.  Patient did not call out and was not symptomatic during this episode.  ----------------------------------------- 3:07 PM on 01/17/2018 -----------------------------------------  Discussed case with Dr. Daleen Squibb of GI who recommends 100 mg of Diflucan x2 weeks for the presumed Candida esophagitis.  ----------------------------------------- 5:19 PM on 01/17/2018 -----------------------------------------  Patient at this time with relief of her chest pain.  No longer wheezing.  Will be discharged with Diflucan, steroids as well as azithromycin.  To be  discharged at this time.  Patient understands the diagnosis as well as treatment and willing to comply. ____________________________________________   FINAL CLINICAL IMPRESSION(S) / ED DIAGNOSES  COPD exacerbation.  Candida esophagitis.  Chest pain.  NEW MEDICATIONS STARTED DURING THIS VISIT:  New Prescriptions   No medications on file     Note:  This document was prepared using Dragon voice recognition software and may include unintentional dictation errors.     Myrna Blazer, MD 01/17/18 [phone]

## 2018-01-17 NOTE — ED Notes (Signed)
Critical troponin of 0.10. Pt taken to room 14 to see EDP. First Nurse Erskine SquibbJane informed. Pt's primary nurse, Lorrie informed. Dr. Pershing ProudSchaevitz was in the room with Lorrie who told him about pts results.

## 2018-01-17 NOTE — ED Notes (Signed)
Pt ambulatory to toilet in room without difficulties.

## 2018-01-17 NOTE — ED Triage Notes (Signed)
Pt states "I have an infection in my chest". Pt states she had an upper GI endoscopy on 01/08/2018 , where her PCP diagnosed her with a chest infection. Pt c/o mid CP that radiates to left side and back. Pt states she feels SOB when she wakes up in the morning. Pt denies taking any medication for CP. VSS. NAD noted. RR even and unlabored.

## 2018-01-17 NOTE — ED Notes (Signed)
Signature pad not working, pt verbalized understanding of discharge and follow up instruction at this time

## 2018-01-17 NOTE — ED Notes (Signed)
Sandwich tray and beverage provided to pt. 

## 2018-04-05 ENCOUNTER — Other Ambulatory Visit: Payer: Self-pay

## 2018-04-05 ENCOUNTER — Encounter: Payer: Self-pay | Admitting: Emergency Medicine

## 2018-04-05 ENCOUNTER — Emergency Department: Payer: Medicare HMO

## 2018-04-05 DIAGNOSIS — E785 Hyperlipidemia, unspecified: Secondary | ICD-10-CM | POA: Diagnosis present

## 2018-04-05 DIAGNOSIS — J9611 Chronic respiratory failure with hypoxia: Secondary | ICD-10-CM | POA: Diagnosis present

## 2018-04-05 DIAGNOSIS — I130 Hypertensive heart and chronic kidney disease with heart failure and stage 1 through stage 4 chronic kidney disease, or unspecified chronic kidney disease: Secondary | ICD-10-CM | POA: Diagnosis not present

## 2018-04-05 DIAGNOSIS — I5023 Acute on chronic systolic (congestive) heart failure: Secondary | ICD-10-CM | POA: Diagnosis present

## 2018-04-05 DIAGNOSIS — E11649 Type 2 diabetes mellitus with hypoglycemia without coma: Secondary | ICD-10-CM | POA: Diagnosis present

## 2018-04-05 DIAGNOSIS — Z90710 Acquired absence of both cervix and uterus: Secondary | ICD-10-CM

## 2018-04-05 DIAGNOSIS — Z7951 Long term (current) use of inhaled steroids: Secondary | ICD-10-CM

## 2018-04-05 DIAGNOSIS — R079 Chest pain, unspecified: Secondary | ICD-10-CM | POA: Diagnosis not present

## 2018-04-05 DIAGNOSIS — Z8673 Personal history of transient ischemic attack (TIA), and cerebral infarction without residual deficits: Secondary | ICD-10-CM

## 2018-04-05 DIAGNOSIS — F17210 Nicotine dependence, cigarettes, uncomplicated: Secondary | ICD-10-CM | POA: Diagnosis present

## 2018-04-05 DIAGNOSIS — G4733 Obstructive sleep apnea (adult) (pediatric): Secondary | ICD-10-CM | POA: Diagnosis present

## 2018-04-05 DIAGNOSIS — Z79899 Other long term (current) drug therapy: Secondary | ICD-10-CM

## 2018-04-05 DIAGNOSIS — J449 Chronic obstructive pulmonary disease, unspecified: Secondary | ICD-10-CM | POA: Diagnosis present

## 2018-04-05 DIAGNOSIS — Z794 Long term (current) use of insulin: Secondary | ICD-10-CM

## 2018-04-05 DIAGNOSIS — I2510 Atherosclerotic heart disease of native coronary artery without angina pectoris: Secondary | ICD-10-CM | POA: Diagnosis present

## 2018-04-05 DIAGNOSIS — I248 Other forms of acute ischemic heart disease: Secondary | ICD-10-CM | POA: Diagnosis present

## 2018-04-05 DIAGNOSIS — E1151 Type 2 diabetes mellitus with diabetic peripheral angiopathy without gangrene: Secondary | ICD-10-CM | POA: Diagnosis present

## 2018-04-05 DIAGNOSIS — Z833 Family history of diabetes mellitus: Secondary | ICD-10-CM

## 2018-04-05 DIAGNOSIS — Z7982 Long term (current) use of aspirin: Secondary | ICD-10-CM

## 2018-04-05 DIAGNOSIS — E1122 Type 2 diabetes mellitus with diabetic chronic kidney disease: Secondary | ICD-10-CM | POA: Diagnosis present

## 2018-04-05 DIAGNOSIS — Z7902 Long term (current) use of antithrombotics/antiplatelets: Secondary | ICD-10-CM

## 2018-04-05 DIAGNOSIS — I959 Hypotension, unspecified: Secondary | ICD-10-CM | POA: Diagnosis present

## 2018-04-05 DIAGNOSIS — N183 Chronic kidney disease, stage 3 (moderate): Secondary | ICD-10-CM | POA: Diagnosis present

## 2018-04-05 DIAGNOSIS — K219 Gastro-esophageal reflux disease without esophagitis: Secondary | ICD-10-CM | POA: Diagnosis present

## 2018-04-05 LAB — CBC WITH DIFFERENTIAL/PLATELET
ABS IMMATURE GRANULOCYTES: 0.02 10*3/uL (ref 0.00–0.07)
BASOS ABS: 0 10*3/uL (ref 0.0–0.1)
BASOS PCT: 0 %
Eosinophils Absolute: 0 10*3/uL (ref 0.0–0.5)
Eosinophils Relative: 0 %
HCT: 40.6 % (ref 36.0–46.0)
Hemoglobin: 12.6 g/dL (ref 12.0–15.0)
Immature Granulocytes: 0 %
Lymphocytes Relative: 25 %
Lymphs Abs: 1.6 10*3/uL (ref 0.7–4.0)
MCH: 27.3 pg (ref 26.0–34.0)
MCHC: 31 g/dL (ref 30.0–36.0)
MCV: 87.9 fL (ref 80.0–100.0)
MONO ABS: 0.5 10*3/uL (ref 0.1–1.0)
Monocytes Relative: 7 %
NRBC: 0 % (ref 0.0–0.2)
Neutro Abs: 4.4 10*3/uL (ref 1.7–7.7)
Neutrophils Relative %: 68 %
PLATELETS: 196 10*3/uL (ref 150–400)
RBC: 4.62 MIL/uL (ref 3.87–5.11)
RDW: 15.8 % — ABNORMAL HIGH (ref 11.5–15.5)
WBC: 6.5 10*3/uL (ref 4.0–10.5)

## 2018-04-05 NOTE — ED Triage Notes (Signed)
Pt to triage via w/c with no distress noted; st x wk having mid to left sided CP and SHOB; st hx of same with "infection"

## 2018-04-06 ENCOUNTER — Emergency Department: Payer: Medicare HMO

## 2018-04-06 ENCOUNTER — Other Ambulatory Visit: Payer: Self-pay

## 2018-04-06 ENCOUNTER — Inpatient Hospital Stay
Admission: EM | Admit: 2018-04-06 | Discharge: 2018-04-09 | DRG: 286 | Disposition: A | Discharge status: Home Health | Payer: Medicare HMO | Attending: Internal Medicine | Admitting: Internal Medicine

## 2018-04-06 DIAGNOSIS — E11649 Type 2 diabetes mellitus with hypoglycemia without coma: Secondary | ICD-10-CM | POA: Diagnosis present

## 2018-04-06 DIAGNOSIS — N183 Chronic kidney disease, stage 3 (moderate): Secondary | ICD-10-CM | POA: Diagnosis present

## 2018-04-06 DIAGNOSIS — I130 Hypertensive heart and chronic kidney disease with heart failure and stage 1 through stage 4 chronic kidney disease, or unspecified chronic kidney disease: Secondary | ICD-10-CM | POA: Diagnosis present

## 2018-04-06 DIAGNOSIS — I5022 Chronic systolic (congestive) heart failure: Secondary | ICD-10-CM

## 2018-04-06 DIAGNOSIS — R079 Chest pain, unspecified: Secondary | ICD-10-CM

## 2018-04-06 DIAGNOSIS — R05 Cough: Secondary | ICD-10-CM

## 2018-04-06 DIAGNOSIS — I959 Hypotension, unspecified: Secondary | ICD-10-CM | POA: Diagnosis present

## 2018-04-06 DIAGNOSIS — Z7951 Long term (current) use of inhaled steroids: Secondary | ICD-10-CM | POA: Diagnosis not present

## 2018-04-06 DIAGNOSIS — G4733 Obstructive sleep apnea (adult) (pediatric): Secondary | ICD-10-CM | POA: Diagnosis present

## 2018-04-06 DIAGNOSIS — E1122 Type 2 diabetes mellitus with diabetic chronic kidney disease: Secondary | ICD-10-CM | POA: Diagnosis present

## 2018-04-06 DIAGNOSIS — R778 Other specified abnormalities of plasma proteins: Secondary | ICD-10-CM

## 2018-04-06 DIAGNOSIS — I5023 Acute on chronic systolic (congestive) heart failure: Secondary | ICD-10-CM | POA: Diagnosis present

## 2018-04-06 DIAGNOSIS — R059 Cough, unspecified: Secondary | ICD-10-CM

## 2018-04-06 DIAGNOSIS — F17210 Nicotine dependence, cigarettes, uncomplicated: Secondary | ICD-10-CM | POA: Diagnosis present

## 2018-04-06 DIAGNOSIS — J9611 Chronic respiratory failure with hypoxia: Secondary | ICD-10-CM | POA: Diagnosis present

## 2018-04-06 DIAGNOSIS — E1151 Type 2 diabetes mellitus with diabetic peripheral angiopathy without gangrene: Secondary | ICD-10-CM | POA: Diagnosis present

## 2018-04-06 DIAGNOSIS — Z7982 Long term (current) use of aspirin: Secondary | ICD-10-CM | POA: Diagnosis not present

## 2018-04-06 DIAGNOSIS — I248 Other forms of acute ischemic heart disease: Secondary | ICD-10-CM | POA: Diagnosis present

## 2018-04-06 DIAGNOSIS — K219 Gastro-esophageal reflux disease without esophagitis: Secondary | ICD-10-CM | POA: Diagnosis present

## 2018-04-06 DIAGNOSIS — R7989 Other specified abnormal findings of blood chemistry: Secondary | ICD-10-CM

## 2018-04-06 DIAGNOSIS — I2510 Atherosclerotic heart disease of native coronary artery without angina pectoris: Secondary | ICD-10-CM | POA: Diagnosis present

## 2018-04-06 DIAGNOSIS — Z7902 Long term (current) use of antithrombotics/antiplatelets: Secondary | ICD-10-CM | POA: Diagnosis not present

## 2018-04-06 DIAGNOSIS — Z90710 Acquired absence of both cervix and uterus: Secondary | ICD-10-CM | POA: Diagnosis not present

## 2018-04-06 DIAGNOSIS — J449 Chronic obstructive pulmonary disease, unspecified: Secondary | ICD-10-CM | POA: Diagnosis present

## 2018-04-06 DIAGNOSIS — Z79899 Other long term (current) drug therapy: Secondary | ICD-10-CM | POA: Diagnosis not present

## 2018-04-06 DIAGNOSIS — E785 Hyperlipidemia, unspecified: Secondary | ICD-10-CM | POA: Diagnosis present

## 2018-04-06 DIAGNOSIS — R0600 Dyspnea, unspecified: Secondary | ICD-10-CM

## 2018-04-06 DIAGNOSIS — R0902 Hypoxemia: Secondary | ICD-10-CM

## 2018-04-06 DIAGNOSIS — Z794 Long term (current) use of insulin: Secondary | ICD-10-CM | POA: Diagnosis not present

## 2018-04-06 DIAGNOSIS — Z8673 Personal history of transient ischemic attack (TIA), and cerebral infarction without residual deficits: Secondary | ICD-10-CM | POA: Diagnosis not present

## 2018-04-06 DIAGNOSIS — Z833 Family history of diabetes mellitus: Secondary | ICD-10-CM | POA: Diagnosis not present

## 2018-04-06 DIAGNOSIS — I509 Heart failure, unspecified: Secondary | ICD-10-CM

## 2018-04-06 LAB — URINALYSIS, COMPLETE (UACMP) WITH MICROSCOPIC
Bilirubin Urine: NEGATIVE
GLUCOSE, UA: NEGATIVE mg/dL
HGB URINE DIPSTICK: NEGATIVE
KETONES UR: NEGATIVE mg/dL
LEUKOCYTES UA: NEGATIVE
NITRITE: NEGATIVE
PH: 5 (ref 5.0–8.0)
Protein, ur: NEGATIVE mg/dL
Specific Gravity, Urine: 1.014 (ref 1.005–1.030)

## 2018-04-06 LAB — BASIC METABOLIC PANEL
Anion gap: 11 (ref 5–15)
BUN: 17 mg/dL (ref 8–23)
CO2: 27 mmol/L (ref 22–32)
CREATININE: 1.33 mg/dL — AB (ref 0.44–1.00)
Calcium: 8.6 mg/dL — ABNORMAL LOW (ref 8.9–10.3)
Chloride: 101 mmol/L (ref 98–111)
GFR calc Af Amer: 50 mL/min — ABNORMAL LOW (ref >60)
GFR calc non Af Amer: 43 mL/min — ABNORMAL LOW (ref >60)
Glucose, Bld: 194 mg/dL — ABNORMAL HIGH (ref 70–99)
Potassium: 4.9 mmol/L (ref 3.5–5.1)
Sodium: 139 mmol/L (ref 135–145)

## 2018-04-06 LAB — TROPONIN I
Troponin I: 0.11 ng/mL (ref <0.03)
Troponin I: 0.14 ng/mL (ref <0.03)
Troponin I: 0.1 ng/mL (ref <0.03)
Troponin I: 0.1 ng/mL (ref <0.03)

## 2018-04-06 LAB — CBC
HEMATOCRIT: 43.1 % (ref 36.0–46.0)
Hemoglobin: 13.4 g/dL (ref 12.0–15.0)
MCH: 27.3 pg (ref 26.0–34.0)
MCHC: 31.1 g/dL (ref 30.0–36.0)
MCV: 87.8 fL (ref 80.0–100.0)
Platelets: 178 10*3/uL (ref 150–400)
RBC: 4.91 MIL/uL (ref 3.87–5.11)
RDW: 15.7 % — ABNORMAL HIGH (ref 11.5–15.5)
WBC: 5.7 10*3/uL (ref 4.0–10.5)
nRBC: 0 % (ref 0.0–0.2)

## 2018-04-06 LAB — GLUCOSE, CAPILLARY
Glucose-Capillary: 30 mg/dL — CL (ref 70–99)
Glucose-Capillary: 66 mg/dL — ABNORMAL LOW (ref 70–99)
Glucose-Capillary: 111 mg/dL — ABNORMAL HIGH (ref 70–99)
Glucose-Capillary: 184 mg/dL — ABNORMAL HIGH (ref 70–99)
Glucose-Capillary: 231 mg/dL — ABNORMAL HIGH (ref 70–99)
Glucose-Capillary: 33 mg/dL — CL (ref 70–99)
Glucose-Capillary: 50 mg/dL — ABNORMAL LOW (ref 70–99)
Glucose-Capillary: 76 mg/dL (ref 70–99)
Glucose-Capillary: 284 mg/dL — ABNORMAL HIGH (ref 70–99)

## 2018-04-06 LAB — COMPREHENSIVE METABOLIC PANEL
ALBUMIN: 3.6 g/dL (ref 3.5–5.0)
ALK PHOS: 44 U/L (ref 38–126)
ALT: 11 U/L (ref 0–44)
ANION GAP: 7 (ref 5–15)
AST: 16 U/L (ref 15–41)
BUN: 18 mg/dL (ref 8–23)
CHLORIDE: 103 mmol/L (ref 98–111)
CO2: 30 mmol/L (ref 22–32)
Calcium: 8.7 mg/dL — ABNORMAL LOW (ref 8.9–10.3)
Creatinine, Ser: 1.46 mg/dL — ABNORMAL HIGH (ref 0.44–1.00)
GFR calc Af Amer: 45 mL/min — ABNORMAL LOW (ref >60)
GFR calc non Af Amer: 38 mL/min — ABNORMAL LOW (ref >60)
GLUCOSE: 117 mg/dL — AB (ref 70–99)
Potassium: 4.1 mmol/L (ref 3.5–5.1)
SODIUM: 140 mmol/L (ref 135–145)
Total Bilirubin: 0.4 mg/dL (ref 0.3–1.2)
Total Protein: 7 g/dL (ref 6.5–8.1)

## 2018-04-06 LAB — BRAIN NATRIURETIC PEPTIDE: B NATRIURETIC PEPTIDE 5: 542 pg/mL — AB (ref 0.0–100.0)

## 2018-04-06 LAB — HEMOGLOBIN A1C
Hgb A1c MFr Bld: 8.2 % — ABNORMAL HIGH (ref 4.8–5.6)
Mean Plasma Glucose: 188.64 mg/dL

## 2018-04-06 LAB — PROTIME-INR
INR: 0.92
Prothrombin Time: 12.3 seconds (ref 11.4–15.2)

## 2018-04-06 LAB — TSH: TSH: 2.153 u[IU]/mL (ref 0.350–4.500)

## 2018-04-06 MED ORDER — NITROGLYCERIN 0.4 MG SL SUBL: 0.4000 mg | SUBLINGUAL_TABLET | SUBLINGUAL | Status: DC | PRN

## 2018-04-06 MED ORDER — HEPARIN SODIUM (PORCINE) 5000 UNIT/ML IJ SOLN
5000.0000 [IU] | Freq: Three times a day (TID) | INTRAMUSCULAR | Status: DC
  Administered 2018-04-06 – 2018-04-09 (×9): 5000 [IU] via SUBCUTANEOUS
  Filled 2018-04-06 (×9): qty 1

## 2018-04-06 MED ORDER — ASPIRIN 81 MG PO CHEW: 81.0000 mg | CHEWABLE_TABLET | ORAL | Status: DC

## 2018-04-06 MED ORDER — ASPIRIN EC 81 MG PO TBEC
81.0000 mg | DELAYED_RELEASE_TABLET | Freq: Every day | ORAL | Status: DC
  Administered 2018-04-06 – 2018-04-09 (×4): 81 mg via ORAL
  Filled 2018-04-06 (×3): qty 1

## 2018-04-06 MED ORDER — ALBUTEROL SULFATE (2.5 MG/3ML) 0.083% IN NEBU
2.5000 mg | INHALATION_SOLUTION | Freq: Four times a day (QID) | RESPIRATORY_TRACT | Status: DC | PRN
  Administered 2018-04-06 – 2018-04-07 (×3): 2.5 mg via RESPIRATORY_TRACT
  Filled 2018-04-06 (×2): qty 3

## 2018-04-06 MED ORDER — DEXTROSE 50 % IV SOLN
25.0000 g | Freq: Once | INTRAVENOUS | Status: AC
  Administered 2018-04-06: 25 g via INTRAVENOUS

## 2018-04-06 MED ORDER — TIOTROPIUM BROMIDE MONOHYDRATE 18 MCG IN CAPS
18.0000 ug | ORAL_CAPSULE | Freq: Every day | RESPIRATORY_TRACT | Status: DC
  Administered 2018-04-06 – 2018-04-09 (×4): 18 ug via RESPIRATORY_TRACT
  Filled 2018-04-06: qty 5

## 2018-04-06 MED ORDER — FUROSEMIDE 10 MG/ML IJ SOLN
40.0000 mg | Freq: Once | INTRAMUSCULAR | Status: AC
  Administered 2018-04-06: 40 mg via INTRAVENOUS
  Filled 2018-04-06: qty 4

## 2018-04-06 MED ORDER — DOCUSATE SODIUM 100 MG PO CAPS
100.0000 mg | ORAL_CAPSULE | Freq: Two times a day (BID) | ORAL | Status: DC
  Administered 2018-04-06 – 2018-04-09 (×6): 100 mg via ORAL
  Filled 2018-04-06 (×6): qty 1

## 2018-04-06 MED ORDER — INSULIN GLARGINE 100 UNIT/ML ~~LOC~~ SOLN
20.0000 [IU] | Freq: Every day | SUBCUTANEOUS | Status: DC
  Administered 2018-04-06 – 2018-04-08 (×3): 20 [IU] via SUBCUTANEOUS
  Filled 2018-04-06 (×4): qty 0.2

## 2018-04-06 MED ORDER — DEXTROSE 50 % IV SOLN
INTRAVENOUS | Status: AC
  Administered 2018-04-06: 25 mL
  Filled 2018-04-06: qty 50

## 2018-04-06 MED ORDER — DEXTROSE 5 % IV SOLN
INTRAVENOUS | Status: AC
  Administered 2018-04-06: 17:00:00 via INTRAVENOUS

## 2018-04-06 MED ORDER — SODIUM CHLORIDE 0.9% FLUSH: 3.0000 mL | INTRAVENOUS | Status: DC | PRN

## 2018-04-06 MED ORDER — ACETAMINOPHEN 325 MG PO TABS: 650.0000 mg | ORAL_TABLET | Freq: Four times a day (QID) | ORAL | Status: DC | PRN

## 2018-04-06 MED ORDER — SODIUM CHLORIDE 0.9 % WEIGHT BASED INFUSION: 1.0000 mL/kg/h | INTRAVENOUS | Status: DC

## 2018-04-06 MED ORDER — ROSUVASTATIN CALCIUM 10 MG PO TABS
40.0000 mg | ORAL_TABLET | Freq: Every day | ORAL | Status: DC
  Administered 2018-04-06 – 2018-04-08 (×3): 40 mg via ORAL
  Filled 2018-04-06 (×3): qty 4

## 2018-04-06 MED ORDER — ACETAMINOPHEN 650 MG RE SUPP: 650.0000 mg | Freq: Four times a day (QID) | RECTAL | Status: DC | PRN

## 2018-04-06 MED ORDER — ASPIRIN 81 MG PO CHEW
324.0000 mg | CHEWABLE_TABLET | Freq: Once | ORAL | Status: AC
  Administered 2018-04-06: 324 mg via ORAL
  Filled 2018-04-06: qty 4

## 2018-04-06 MED ORDER — SODIUM CHLORIDE 0.9 % WEIGHT BASED INFUSION: 3.0000 mL/kg/h | INTRAVENOUS | Status: DC

## 2018-04-06 MED ORDER — INSULIN ASPART 100 UNIT/ML ~~LOC~~ SOLN
0.0000 [IU] | Freq: Three times a day (TID) | SUBCUTANEOUS | Status: DC
  Administered 2018-04-06: 2 [IU] via SUBCUTANEOUS
  Administered 2018-04-06 – 2018-04-07 (×2): 5 [IU] via SUBCUTANEOUS
  Administered 2018-04-07: 3 [IU] via SUBCUTANEOUS
  Administered 2018-04-08: 2 [IU] via SUBCUTANEOUS
  Administered 2018-04-09: 5 [IU] via SUBCUTANEOUS
  Administered 2018-04-09: 2 [IU] via SUBCUTANEOUS
  Filled 2018-04-06 (×7): qty 1

## 2018-04-06 MED ORDER — DEXTROSE 50 % IV SOLN
INTRAVENOUS | Status: AC
  Filled 2018-04-06: qty 50

## 2018-04-06 MED ORDER — SODIUM CHLORIDE 0.9 % IV SOLN: 250.0000 mL | INTRAVENOUS | Status: DC | PRN

## 2018-04-06 MED ORDER — SODIUM CHLORIDE 0.9% FLUSH
3.0000 mL | Freq: Two times a day (BID) | INTRAVENOUS | Status: DC
  Administered 2018-04-06: 3 mL via INTRAVENOUS

## 2018-04-06 MED ORDER — INSULIN GLARGINE 100 UNIT/ML ~~LOC~~ SOLN
24.0000 [IU] | Freq: Every day | SUBCUTANEOUS | Status: DC
  Filled 2018-04-06: qty 0.24

## 2018-04-06 MED ORDER — SODIUM CHLORIDE 0.9% FLUSH
3.0000 mL | Freq: Two times a day (BID) | INTRAVENOUS | Status: DC
  Administered 2018-04-06 – 2018-04-08 (×3): 3 mL via INTRAVENOUS

## 2018-04-06 MED ORDER — ONDANSETRON HCL 4 MG/2ML IJ SOLN: 4.0000 mg | Freq: Four times a day (QID) | INTRAMUSCULAR | Status: DC | PRN

## 2018-04-06 MED ORDER — CARVEDILOL 3.125 MG PO TABS
3.1250 mg | ORAL_TABLET | Freq: Two times a day (BID) | ORAL | Status: DC
  Administered 2018-04-06 – 2018-04-07 (×3): 3.125 mg via ORAL
  Filled 2018-04-06 (×4): qty 1

## 2018-04-06 MED ORDER — CILOSTAZOL 100 MG PO TABS
100.0000 mg | ORAL_TABLET | Freq: Two times a day (BID) | ORAL | Status: DC
  Administered 2018-04-06 – 2018-04-09 (×6): 100 mg via ORAL
  Filled 2018-04-06 (×8): qty 1

## 2018-04-06 MED ORDER — HYDROCODONE-ACETAMINOPHEN 5-325 MG PO TABS: 1.0000 | ORAL_TABLET | Freq: Four times a day (QID) | ORAL | Status: DC | PRN

## 2018-04-06 MED ORDER — SACUBITRIL-VALSARTAN 24-26 MG PO TABS
1.0000 | ORAL_TABLET | Freq: Two times a day (BID) | ORAL | Status: DC
  Administered 2018-04-06: 1 via ORAL
  Filled 2018-04-06: qty 1

## 2018-04-06 MED ORDER — FLUTICASONE FUROATE-VILANTEROL 200-25 MCG/INH IN AEPB
1.0000 | INHALATION_SPRAY | Freq: Every day | RESPIRATORY_TRACT | Status: DC
  Administered 2018-04-06 – 2018-04-09 (×3): 1 via RESPIRATORY_TRACT
  Filled 2018-04-06: qty 28

## 2018-04-06 MED ORDER — ONDANSETRON HCL 4 MG PO TABS: 4.0000 mg | ORAL_TABLET | Freq: Four times a day (QID) | ORAL | Status: DC | PRN

## 2018-04-06 NOTE — Progress Notes (Addendum)
[facility] [place] at [facility]   PATIENT NAME: Alexis Ray    MR#:  [id number]  DATE OF BIRTH:  [birth year]  SUBJECTIVE:  CHIEF COMPLAINT: Patient denies any chest pain reports she is tired   REVIEW OF SYSTEMS:  CONSTITUTIONAL: No fever, fatigue or weakness.  EYES: No blurred or double vision.  EARS, NOSE, AND THROAT: No tinnitus or ear pain.  RESPIRATORY: No cough, reports shortness of breath, denies wheezing or hemoptysis.  CARDIOVASCULAR: No chest pain, orthopnea, edema.  GASTROINTESTINAL: No nausea, vomiting, diarrhea or abdominal pain.  GENITOURINARY: No dysuria, hematuria.  ENDOCRINE: No polyuria, nocturia,  MUSCULOSKELETAL: No joint pain or arthritis.   NEUROLOGIC: No tingling, numbness, weakness.  PSYCHIATRY: No anxiety or depression.   DRUG ALLERGIES:   Allergies  Allergen Reactions  . Colchicine     itching  . Sulfa Antibiotics Rash    itching  . Levaquin [Levofloxacin In D5w] Rash    VITALS:  Blood pressure (!) 98/55, pulse 77, temperature (!) 97.4 F (36.3 C), temperature source Oral, resp. rate 14, height 5\' 5"  (1.651 m), weight 88 kg, SpO2 94 %.  PHYSICAL EXAMINATION:  GENERAL:  62 y.o.-year-old patient lying in the bed with no acute distress.  EYES: Pupils equal, round, reactive to light and accommodation. No scleral icterus. Extraocular muscles intact.  HEENT: Head atraumatic, normocephalic. Oropharynx and nasopharynx clear.  NECK:  Supple, no jugular venous distention. No thyroid enlargement, no tenderness.  LUNGS: Normal breath sounds bilaterally, no wheezing, rales,rhonchi or crepitation. No use of accessory muscles of respiration.  CARDIOVASCULAR: S1, S2 normal. No murmurs, rubs, or gallops.  ABDOMEN: Soft, nontender, nondistended. Bowel sounds present. No organomegaly or mass.  EXTREMITIES: No pedal edema, cyanosis, or clubbing.  NEUROLOGIC: Sleepy but arousable and oriented x3 when she is awake .sensation intact. Gait  not checked.  PSYCHIATRIC: The patient is sleepy but arousable  and oriented x 3.  SKIN: No obvious rash, lesion, or ulcer.    LABORATORY PANEL:   CBC Recent Labs  Lab 04/06/18 1247  WBC 5.7  HGB 13.4  HCT 43.1  PLT 178   ------------------------------------------------------------------------------------------------------------------  Chemistries  Recent Labs  Lab 04/05/18 2312 04/06/18 1247  NA 140 139  K 4.1 4.9  CL 103 101  CO2 30 27  GLUCOSE 117* 194*  BUN 18 17  CREATININE 1.46* 1.33*  CALCIUM 8.7* 8.6*  AST 16  --   ALT 11  --   ALKPHOS 44  --   BILITOT 0.4  --    ------------------------------------------------------------------------------------------------------------------  Cardiac Enzymes Recent Labs  Lab 04/06/18 1200  TROPONINI 0.10*   ------------------------------------------------------------------------------------------------------------------  RADIOLOGY:  Dg Chest 2 View  Result Date: 04/05/2018 CLINICAL DATA:  Chest pain EXAM: CHEST - 2 VIEW COMPARISON:  01/17/2018 FINDINGS: Cardiomegaly with vascular congestion and interstitial prominence which could reflect early or mild interstitial edema. No effusions. No acute bony abnormality. IMPRESSION: Cardiomegaly. Worsening interstitial prominence concerning for interstitial edema. Electronically Signed   By: Charlett Nose M.D.   On: 04/05/2018 23:36   Dg Abdomen 1 View  Result Date: 04/06/2018 CLINICAL DATA:  Mid chest pain and shortness of breath. Constipation. EXAM: ABDOMEN - 1 VIEW COMPARISON:  None. FINDINGS: The bowel gas pattern is normal. Moderate to large volume retained large bowel stool. Aortoiliac stent graft. No radio-opaque calculi or other significant radiographic abnormality are seen. Surgical clips LEFT inguinal soft tissues seen with prior vascular access. IMPRESSION: 1. Moderate to large volume retained large bowel stool. Nonobstructive  bowel gas pattern. Electronically Signed    By: Awilda Metroourtnay  Bloomer M.D.   On: 04/06/2018 01:49    EKG:   Orders placed or performed during the hospital encounter of 04/06/18  . EKG 12-Lead  . EKG 12-Lead  . ED EKG  . ED EKG    ASSESSMENT AND PLAN:    This is a 62 year old female admitted for chest pain.  1.  Acute on chronic systolic CHF: Hold Coreg and Entresto for hypotension 2. Chest pain:  Monitor telemetry. Troponin 0 0.14-0.10 seen by Dr. Park BreedKahn, cardiology initially planned for cardiac cath but patient being hypoglycemic is rescheduled for Thursday, asking nephrology for cardiac cath clearance.  Nephrology consult placed 3. CAD: continue aspirin and Pletal.  Continue statin high intensity 4. Hypertension: Currently hypotensive hold antihypertensives 5. COPD: continue inhaled corticosteroid and Spriiva. Albuterol as needed 6. Diabetes mellitus type 2: continue basal insulin adjusted for hospital diet.  Patient takes Lantus 45 units at home , will adjust the dose to 20 units p.o. nightly if she tolerates diet  Patient was kept n.p.o. for cardiac cath and became hypoglycemic cardiac cath canceled, resume diet and monitor Accu-Cheks sliding scale insulin while hospitalized as well 7.  Hyperlipidemia: continue statin therapy 8. OSA: continue CPAP 9. DVT prophylaxis: heparin     All the records are reviewed and case discussed with Care Management/Social Workerr. Management plans discussed with the patient, family and they are in agreement.  CODE STATUS: fc  TOTAL TIME TAKING CARE OF THIS PATIENT: 35minutes.   POSSIBLE D/C IN 2 DAYS, DEPENDING ON CLINICAL CONDITION.  Note: This dictation was prepared with Dragon dictation along with smaller phrase technology. Any transcriptional errors that result from this process are unintentional.   Ramonita LabAruna Cresencia Asmus M.D on 04/06/2018 at 5:57 PM  Between 7am to 6pm - Pager - [phone] After 6pm go to [email or web address] - password [email or web address] [facility]  [place]  [facility]  Office   [phone]  CC: Primary care physician; Keane PoliceNoorani, Sezmin S, MD

## 2018-04-06 NOTE — Care Management Note (Signed)
Case Management Note  Patient Details  Name: Nadara MustardDebra W Marteney MRN: [id number] Date of Birth: Oct 06, 1955  Subjective/Objective:  Patient is from home; lives with daughter.  Acute on chronic CHF.  Elevated troponin's; having cardiac catheterization this afternoon.  She is on chronic oxygen at home at 3-4L.  Asked patient to have someone bring in her portable tank for discharge.  She has a functioning scale, walker and cane at home.  [facility]  Denies difficulty obtaining medications or with medical care.  She is current with [place].  Patient would like home health services at discharge.  After offering choice, RNCM made a referral to [facility].  Has a heart Failure Clinic appointment scheduled.                 Action/Plan:   Expected Discharge Date:                  Expected Discharge Plan:  Home w Home Health Services  In-House Referral:     Discharge planning Services  CM Consult  Post Acute Care Choice:  Home Health Choice offered to:  Patient  DME Arranged:    DME Agency:     HH Arranged:  RN, PT, OT, Social Work [employer] Agency:  [facility]  Status of Service:  In process, will continue to follow  If discussed at Long Length of Stay Meetings, dates discussed:    Additional Comments:  Sherren KernsJennifer L Gregg Winchell, RN 04/06/2018, 2:35 PM

## 2018-04-06 NOTE — ED Notes (Signed)
Walking O2 sat 89-94%

## 2018-04-06 NOTE — Progress Notes (Signed)
Dr. Adrian BlackwaterShaukat khan notified of patient's decreased renal function gfr 50 today.  Dr. Welton FlakesKhan states he is aware and will be using limited contrast for the cardiac cath and if PCI needed will "stage " procedures to protect patient's already compromised kidney function.

## 2018-04-06 NOTE — Progress Notes (Signed)
Unable to do cardiac catheterization today since patient developed severe hypoglycemia between 30 and 50 in spite of getting glucose and renal insufficiency with GFR around 46.  Advise getting renal consult as patient probably will need intervention and significant amount of dye would have to be given.  Will postpone cardiac catheterization until renal has seen the patient and sugar stabilizes.  Most likely will plan on doing it on Thursday morning.

## 2018-04-06 NOTE — Progress Notes (Signed)
Cardiovascular and Pulmonary Nurse Navigator Note:    62 year old [place] female with PMHx of COPD, CAD, chronic combined systolic and diastolic CHF, GERD, DM, HTN, PVD, Stroke who presented to he hospital with SOB and chest pain.  Patient admitted with dx of CHF (BNP 542), and chest pain.  Patient is currently on Coreg and Entresto.  Troponins  0.11/0.14/0.10.  Dr. Welton FlakesKhan plans to perform Cardiac Catheterization today.    Briefly rounded on patient to review the 5 steps to living better with heart failure.    This is [facility].  Notes in CHF reveal that patient has not been seen in [facility] since 07/09/2015.    Patient has scales and reports that she weighs everyday.  Patient stated she did not have a weight increase prior to admission.  Patient reports that she walks for exercise.    Army Meliaiane Linwood Gullikson, RN, BSN, [facility]  [place]  [facility] Cardiac & Pul[facility] Rehab  Cardiovascular & Pulmonary Nurse Navigator  Direct Line: [phone]  Department Phone #: [phone] Fax: [phone]  Email Address: [email or web address]

## 2018-04-06 NOTE — Consult Note (Signed)
Alexis Ray is a 62 y.o. female  [id number]  Primary Cardiologist: Adrian BlackwaterShaukat Khan Reason for Consultation: Congestive heart failure  HPI: This is a 62 year old African-American female with a past medical history of COPD presented to the hospital with shortness of breath cough and hemoptysis.  Patient also had vaccine and waning chest pain associated with tightness.   Review of Systems: Patient does have some orthopnea and cough hemoptysis.   Past Medical History:  Diagnosis Date  . CAD (coronary artery disease)   . Chronic combined systolic and diastolic CHF (congestive heart failure) (HCC)   . COPD (chronic obstructive pulmonary disease) (HCC)   . Diabetes mellitus without complication (HCC)   . GERD (gastroesophageal reflux disease)   . Hypertension   . PVD (peripheral vascular disease) (HCC)   . Stroke (cerebrum) (HCC)     Medications Prior to Admission  Medication Sig Dispense Refill  . albuterol (PROVENTIL) (2.5 MG/3ML) 0.083% nebulizer solution Take 3 mLs (2.5 mg total) by nebulization every 6 (six) hours as needed for wheezing or shortness of breath. 75 mL 2  . albuterol (VENTOLIN HFA) 108 (90 Base) MCG/ACT inhaler Inhale into the lungs every 6 (six) hours as needed for wheezing or shortness of breath.    Marland Kitchen. aspirin EC 81 MG tablet Take 81 mg by mouth daily.    . budesonide-formoterol (SYMBICORT) 160-4.5 MCG/ACT inhaler Inhale 2 puffs into the lungs 2 (two) times daily.    . carvedilol (COREG) 3.125 MG tablet Take 1 tablet (3.125 mg total) by mouth 2 (two) times daily with a meal. 60 tablet 0  . Cholecalciferol 10000 units CAPS Take 1,000 Units by mouth daily.    . cilostazol (PLETAL) 100 MG tablet Take 100 mg by mouth 2 (two) times daily.    Marland Kitchen. esomeprazole (NEXIUM) 40 MG capsule Take 40 mg by mouth daily at 12 noon.    . ferrous sulfate 325 (65 FE) MG tablet Take 325 mg by mouth daily with breakfast.    . furosemide (LASIX) 40 MG tablet Take 1 tablet (40 mg total) by  mouth 2 (two) times daily. 30 tablet 0  . HYDROcodone-acetaminophen (NORCO/VICODIN) 5-325 MG tablet Take 1 tablet by mouth every 6 (six) hours as needed for moderate pain. 20 tablet 0  . insulin aspart (NOVOLOG) 100 UNIT/ML injection Inject 10 Units into the skin 3 (three) times daily with meals.    . insulin glargine (LANTUS) 100 UNIT/ML injection Inject 40 Units into the skin at bedtime.    Marland Kitchen. levocetirizine (XYZAL) 5 MG tablet Take 5 mg by mouth at bedtime.     . metFORMIN (GLUCOPHAGE) 500 MG tablet Take 500 mg by mouth 2 (two) times daily with a meal.     . Multiple Vitamins-Minerals (SENTRY SENIOR) TABS Take 1 tablet by mouth daily.    . nitroGLYCERIN (NITROSTAT) 0.4 MG SL tablet Place 0.4 mg under the tongue every 5 (five) minutes as needed for chest pain.    . pramipexole (MIRAPEX) 0.125 MG tablet Take 1 tablet by mouth daily.    . predniSONE (DELTASONE) 20 MG tablet Take 2 tablets (40 mg total) by mouth daily with breakfast. 10 tablet 0  . pregabalin (LYRICA) 150 MG capsule Take 150 mg by mouth 3 (three) times daily.     . rosuvastatin (CRESTOR) 40 MG tablet Take 40 mg by mouth daily.    . sacubitril-valsartan (ENTRESTO) 24-26 MG Take 1 tablet by mouth 2 (two) times daily. 60 tablet 0  .  spironolactone (ALDACTONE) 50 MG tablet Take 50 mg by mouth daily.    Marland Kitchen tiotropium (SPIRIVA) 18 MCG inhalation capsule Place 18 mcg into inhaler and inhale daily.    Marland Kitchen triamcinolone cream (KENALOG) 0.1 % Apply 1 application topically 2 (two) times daily. To affected area    . Venlafaxine HCl 225 MG TB24 Take 225 mg by mouth daily.     Marland Kitchen VICTOZA 18 MG/3ML SOPN Inject 1.8 mg into the skin daily.    . vitamin C (ASCORBIC ACID) 500 MG tablet Take 500 mg by mouth daily.       Marland Kitchen aspirin EC  81 mg Oral Daily  . carvedilol  3.125 mg Oral BID WC  . cilostazol  100 mg Oral BID  . docusate sodium  100 mg Oral BID  . fluticasone furoate-vilanterol  1 puff Inhalation Daily  . heparin  5,000 Units Subcutaneous  Q8H  . insulin aspart  0-15 Units Subcutaneous TID WC  . insulin glargine  24 Units Subcutaneous QHS  . rosuvastatin  40 mg Oral Daily  . sacubitril-valsartan  1 tablet Oral BID  . tiotropium  18 mcg Inhalation Daily    Infusions:   Allergies  Allergen Reactions  . Colchicine     itching  . Sulfa Antibiotics Rash    itching  . Levaquin [Levofloxacin In D5w] Rash    Social History   Socioeconomic History  . Marital status: Single    Spouse name: Not on file  . Number of children: Not on file  . Years of education: Not on file  . Highest education level: Not on file  Occupational History  . Not on file  Social Needs  . Financial resource strain: Not on file  . Food insecurity:    Worry: Not on file    Inability: Not on file  . Transportation needs:    Medical: Not on file    Non-medical: Not on file  Tobacco Use  . Smoking status: Current Every Day Smoker    Packs/day: 1.00    Types: Cigarettes  . Smokeless tobacco: Never Used  Substance and Sexual Activity  . Alcohol use: No    Alcohol/week: 0.0 standard drinks  . Drug use: No  . Sexual activity: Never  Lifestyle  . Physical activity:    Days per week: Not on file    Minutes per session: Not on file  . Stress: Not on file  Relationships  . Social connections:    Talks on phone: Not on file    Gets together: Not on file    Attends religious service: Not on file    Active member of club or organization: Not on file    Attends meetings of clubs or organizations: Not on file    Relationship status: Not on file  . Intimate partner violence:    Fear of current or ex partner: Not on file    Emotionally abused: Not on file    Physically abused: Not on file    Forced sexual activity: Not on file  Other Topics Concern  . Not on file  Social History Narrative  . Not on file    Family History  Problem Relation Age of Onset  . Diabetes Mother   . Cancer Father   . Diabetes Maternal Aunt   . Diabetes  Maternal Aunt     PHYSICAL EXAM: Vitals:   04/06/18 0804 04/06/18 0806  BP: 95/60 118/79  Pulse: 90 93  Resp: 19  Temp: 98.2 F (36.8 C)   SpO2: 94%     No intake or output data in the 24 hours ending 04/06/18 0853  General:  Well appearing. No respiratory difficulty HEENT: normal Neck: supple. no JVD. Carotids 2+ bilat; no bruits. No lymphadenopathy or thryomegaly appreciated. Cor: PMI nondisplaced. Regular rate & rhythm. No rubs, gallops or murmurs. Lungs: clear Abdomen: soft, nontender, nondistended. No hepatosplenomegaly. No bruits or masses. Good bowel sounds. Extremities: no cyanosis, clubbing, rash, edema Neuro: alert & oriented x 3, cranial nerves grossly intact. moves all 4 extremities w/o difficulty. Affect pleasant.  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 with nonspecific ST-T changes  Results for orders placed or performed during the hospital encounter of 04/06/18 (from the past 24 hour(s))  CBC with Differential     Status: Abnormal   Collection Time: 04/05/18 11:12 PM  Result Value Ref Range   WBC 6.5 4.0 - 10.5 K/uL   RBC 4.62 3.87 - 5.11 MIL/uL   Hemoglobin 12.6 12.0 - 15.0 g/dL   HCT 16.1 09.6 - 04.5 %   MCV 87.9 80.0 - 100.0 fL   MCH 27.3 26.0 - 34.0 pg   MCHC 31.0 30.0 - 36.0 g/dL   RDW 40.9 (H) 81.1 - 91.4 %   Platelets 196 150 - 400 K/uL   nRBC 0.0 0.0 - 0.2 %   Neutrophils Relative % 68 %   Neutro Abs 4.4 1.7 - 7.7 K/uL   Lymphocytes Relative 25 %   Lymphs Abs 1.6 0.7 - 4.0 K/uL   Monocytes Relative 7 %   Monocytes Absolute 0.5 0.1 - 1.0 K/uL   Eosinophils Relative 0 %   Eosinophils Absolute 0.0 0.0 - 0.5 K/uL   Basophils Relative 0 %   Basophils Absolute 0.0 0.0 - 0.1 K/uL   Immature Granulocytes 0 %   Abs Immature Granulocytes 0.02 0.00 - 0.07 K/uL  Comprehensive metabolic panel     Status: Abnormal   Collection Time: 04/05/18 11:12 PM  Result Value Ref Range   Sodium 140 135 - 145 mmol/L   Potassium 4.1 3.5 - 5.1  mmol/L   Chloride 103 98 - 111 mmol/L   CO2 30 22 - 32 mmol/L   Glucose, Bld 117 (H) 70 - 99 mg/dL   BUN 18 8 - 23 mg/dL   Creatinine, Ser 7.82 (H) 0.44 - 1.00 mg/dL   Calcium 8.7 (L) 8.9 - 10.3 mg/dL   Total Protein 7.0 6.5 - 8.1 g/dL   Albumin 3.6 3.5 - 5.0 g/dL   AST 16 15 - 41 U/L   ALT 11 0 - 44 U/L   Alkaline Phosphatase 44 38 - 126 U/L   Total Bilirubin 0.4 0.3 - 1.2 mg/dL   GFR calc non Af Amer 38 (L) >60 mL/min   GFR calc Af Amer 45 (L) >60 mL/min   Anion gap 7 5 - 15  Troponin I - ONCE - STAT     Status: Abnormal   Collection Time: 04/05/18 11:12 PM  Result Value Ref Range   Troponin I 0.11 (HH) <0.03 ng/mL  Brain natriuretic peptide     Status: Abnormal   Collection Time: 04/05/18 11:12 PM  Result Value Ref Range   B Natriuretic Peptide 542.0 (H) 0.0 - 100.0 pg/mL  Urinalysis, Complete w Microscopic     Status: Abnormal   Collection Time: 04/06/18  1:40 AM  Result Value Ref Range   Color, Urine YELLOW (A) YELLOW   APPearance CLEAR (  A) CLEAR   Specific Gravity, Urine 1.014 1.005 - 1.030   pH 5.0 5.0 - 8.0   Glucose, UA NEGATIVE NEGATIVE mg/dL   Hgb urine dipstick NEGATIVE NEGATIVE   Bilirubin Urine NEGATIVE NEGATIVE   Ketones, ur NEGATIVE NEGATIVE mg/dL   Protein, ur NEGATIVE NEGATIVE mg/dL   Nitrite NEGATIVE NEGATIVE   Leukocytes, UA NEGATIVE NEGATIVE   RBC / HPF 0-5 0 - 5 RBC/hpf   WBC, UA 0-5 0 - 5 WBC/hpf   Bacteria, UA MANY (A) NONE SEEN   Squamous Epithelial / LPF 0-5 0 - 5   Mucus PRESENT   Glucose, capillary     Status: Abnormal   Collection Time: 04/06/18  5:35 AM  Result Value Ref Range   Glucose-Capillary 30 (LL) 70 - 99 mg/dL  TSH     Status: None   Collection Time: 04/06/18  6:03 AM  Result Value Ref Range   TSH 2.153 0.350 - 4.500 uIU/mL  Troponin I - Now Then Q6H     Status: Abnormal   Collection Time: 04/06/18  6:03 AM  Result Value Ref Range   Troponin I 0.14 (HH) <0.03 ng/mL  Glucose, capillary     Status: Abnormal   Collection Time:  04/06/18  6:13 AM  Result Value Ref Range   Glucose-Capillary 66 (L) 70 - 99 mg/dL  Glucose, capillary     Status: Abnormal   Collection Time: 04/06/18  6:47 AM  Result Value Ref Range   Glucose-Capillary 111 (H) 70 - 99 mg/dL  Glucose, capillary     Status: Abnormal   Collection Time: 04/06/18  8:06 AM  Result Value Ref Range   Glucose-Capillary 184 (H) 70 - 99 mg/dL   Comment 1 Notify RN    Comment 2 Document in Chart    Dg Chest 2 View  Result Date: 04/05/2018 CLINICAL DATA:  Chest pain EXAM: CHEST - 2 VIEW COMPARISON:  01/17/2018 FINDINGS: Cardiomegaly with vascular congestion and interstitial prominence which could reflect early or mild interstitial edema. No effusions. No acute bony abnormality. IMPRESSION: Cardiomegaly. Worsening interstitial prominence concerning for interstitial edema. Electronically Signed   By: Charlett Nose M.D.   On: 04/05/2018 23:36   Dg Abdomen 1 View  Result Date: 04/06/2018 CLINICAL DATA:  Mid chest pain and shortness of breath. Constipation. EXAM: ABDOMEN - 1 VIEW COMPARISON:  None. FINDINGS: The bowel gas pattern is normal. Moderate to large volume retained large bowel stool. Aortoiliac stent graft. No radio-opaque calculi or other significant radiographic abnormality are seen. Surgical clips LEFT inguinal soft tissues seen with prior vascular access. IMPRESSION: 1. Moderate to large volume retained large bowel stool. Nonobstructive bowel gas pattern. Electronically Signed   By: Awilda Metro M.D.   On: 04/06/2018 01:49     ASSESSMENT AND PLAN: Acute coronary syndrome with elevated troponin with history of LV dysfunction and renal insufficiency.  Advise cardiac catheterization.  KHAN,SHAUKAT A

## 2018-04-06 NOTE — ED Notes (Signed)
Patient transported to X-ray 

## 2018-04-06 NOTE — ED Notes (Signed)
Pt assisted to the bathroom in room with a steady gait. Pt was able to urinate 300cc of urine

## 2018-04-06 NOTE — H&P (Signed)
Alexis MustardDebra W Ray is an 62 y.o. female.   Chief Complaint: Chest pain HPI: The patient with past medical history of CAD, COPD, diabetes and hypertension presents to the emergency department complaining of vague chest pain.  The patient thinks that she may have a urinary tract infection as she has felt this way in the past when she had a UTI.  She also emphasizes intermittent shortness of breath over the last week but states that her chest is felt heavy.  When pointing to her pain she indicates her epigastric area.  KUB revealed large volume retained stool as well as old interstitial edema.  The patient was given Lasix and aspirin in the emergency department which eased her pain.  Urinalysis was negative for infection.  Laboratory evaluation revealed elevated BNP as well as elevated troponin.  The patient had no EKG changes to indicate ischemia however due to her risk factors the emergency department staff asked the hospitalist service to admit for further evaluation.  Past Medical History:  Diagnosis Date  . CAD (coronary artery disease)   . Chronic combined systolic and diastolic CHF (congestive heart failure) (HCC)   . COPD (chronic obstructive pulmonary disease) (HCC)   . Diabetes mellitus without complication (HCC)   . GERD (gastroesophageal reflux disease)   . Hypertension   . PVD (peripheral vascular disease) (HCC)   . Stroke (cerebrum) [facility](HCC)     Past Surgical History:  Procedure Laterality Date  . ABDOMINAL HYSTERECTOMY    . CARDIAC CATHETERIZATION    . COLONOSCOPY    . EYE SURGERY Bilateral     Family History  Problem Relation Age of Onset  . Diabetes Mother   . Cancer Father   . Diabetes Maternal Aunt   . Diabetes Maternal Aunt    Social History:  reports that she has been smoking cigarettes. She has been smoking about 1.00 pack per day. She has never used smokeless tobacco. She reports that she does not drink alcohol or use drugs.  Allergies:  Allergies  Allergen Reactions   . Colchicine     itching  . Sulfa Antibiotics Rash    itching  . Levaquin [Levofloxacin In D5w] Rash    Medications Prior to Admission  Medication Sig Dispense Refill  . albuterol (PROVENTIL) (2.5 MG/3ML) 0.083% nebulizer solution Take 3 mLs (2.5 mg total) by nebulization every 6 (six) hours as needed for wheezing or shortness of breath. 75 mL 2  . albuterol (VENTOLIN HFA) 108 (90 Base) MCG/ACT inhaler Inhale 2 puffs into the lungs every 6 (six) hours as needed for wheezing or shortness of breath.     Marland Kitchen. aspirin EC 81 MG tablet Take 81 mg by mouth daily.    . budesonide-formoterol (SYMBICORT) 160-4.5 MCG/ACT inhaler Inhale 2 puffs into the lungs 2 (two) times daily.    . carvedilol (COREG) 3.125 MG tablet Take 1 tablet (3.125 mg total) by mouth 2 (two) times daily with a meal. 60 tablet 0  . cholecalciferol (VITAMIN D3) 25 MCG (1000 UT) tablet Take 1,000 Units by mouth daily.    . cilostazol (PLETAL) 100 MG tablet Take 100 mg by mouth 2 (two) times daily.    . Dulaglutide 1.5 MG/0.5ML SOPN Inject 1.5 mg into the skin every Monday.    . esomeprazole (NEXIUM) 40 MG capsule Take 40 mg by mouth daily.     . ferrous sulfate 325 (65 FE) MG tablet Take 325 mg by mouth daily with breakfast.    . fluticasone (FLONASE) 50  MCG/ACT nasal spray Place 1 spray into both nostrils 2 (two) times daily.    . furosemide (LASIX) 40 MG tablet Take 1 tablet (40 mg total) by mouth 2 (two) times daily. 30 tablet 0  . insulin aspart (NOVOLOG) 100 UNIT/ML injection Inject 10 Units into the skin 3 (three) times daily with meals.    . insulin glargine (LANTUS) 100 UNIT/ML injection Inject 45 Units into the skin at bedtime.     Marland Kitchen levocetirizine (XYZAL) 5 MG tablet Take 5 mg by mouth at bedtime.     . metFORMIN (GLUCOPHAGE) 500 MG tablet Take 500 mg by mouth 2 (two) times daily with a meal.     . Multiple Vitamins-Minerals (SENTRY SENIOR) TABS Take 1 tablet by mouth daily.    . nitroGLYCERIN (NITROSTAT) 0.4 MG SL tablet  Place 0.4 mg under the tongue every 5 (five) minutes as needed for chest pain.    . pramipexole (MIRAPEX) 0.125 MG tablet Take 0.125 mg by mouth at bedtime.     . pregabalin (LYRICA) 150 MG capsule Take 150 mg by mouth 3 (three) times daily.     . rosuvastatin (CRESTOR) 40 MG tablet Take 40 mg by mouth daily.    . sacubitril-valsartan (ENTRESTO) 24-26 MG Take 1 tablet by mouth 2 (two) times daily. 60 tablet 0  . spironolactone (ALDACTONE) 25 MG tablet Take 12.5 mg by mouth daily.    . Tiotropium Bromide Monohydrate (SPIRIVA RESPIMAT) 2.5 MCG/ACT AERS Inhale 2 puffs into the lungs daily.    . Venlafaxine HCl 225 MG TB24 Take 225 mg by mouth daily.     . vitamin C (ASCORBIC ACID) 500 MG tablet Take 500 mg by mouth daily.    . predniSONE (DELTASONE) 20 MG tablet Take 2 tablets (40 mg total) by mouth daily with breakfast. (Patient not taking: Reported on 04/06/2018) 10 tablet 0    Results for orders placed or performed during the hospital encounter of 04/06/18 (from the past 48 hour(s))  CBC with Differential     Status: Abnormal   Collection Time: 04/05/18 11:12 PM  Result Value Ref Range   WBC 6.5 4.0 - 10.5 K/uL   RBC 4.62 3.87 - 5.11 MIL/uL   Hemoglobin 12.6 12.0 - 15.0 g/dL   HCT 16.1 09.6 - 04.5 %   MCV 87.9 80.0 - 100.0 fL   MCH 27.3 26.0 - 34.0 pg   MCHC 31.0 30.0 - 36.0 g/dL   RDW 40.9 (H) 81.1 - 91.4 %   Platelets 196 150 - 400 K/uL   nRBC 0.0 0.0 - 0.2 %   Neutrophils Relative % 68 %   Neutro Abs 4.4 1.7 - 7.7 K/uL   Lymphocytes Relative 25 %   Lymphs Abs 1.6 0.7 - 4.0 K/uL   Monocytes Relative 7 %   Monocytes Absolute 0.5 0.1 - 1.0 K/uL   Eosinophils Relative 0 %   Eosinophils Absolute 0.0 0.0 - 0.5 K/uL   Basophils Relative 0 %   Basophils Absolute 0.0 0.0 - 0.1 K/uL   Immature Granulocytes 0 %   Abs Immature Granulocytes 0.02 0.00 - 0.07 K/uL    Comment: Performed at [facility], [address] [place], [place] [address]  Comprehensive metabolic panel      Status: Abnormal   Collection Time: 04/05/18 11:12 PM  Result Value Ref Range   Sodium 140 135 - 145 mmol/L   Potassium 4.1 3.5 - 5.1 mmol/L   Chloride 103 98 - 111 mmol/L  CO2 30 22 - 32 mmol/L   Glucose, Bld 117 (H) 70 - 99 mg/dL   BUN 18 8 - 23 mg/dL   Creatinine, Ser 1.61 (H) 0.44 - 1.00 mg/dL   Calcium 8.7 (L) 8.9 - 10.3 mg/dL   Total Protein 7.0 6.5 - 8.1 g/dL   Albumin 3.6 3.5 - 5.0 g/dL   AST 16 15 - 41 U/L   ALT 11 0 - 44 U/L   Alkaline Phosphatase 44 38 - 126 U/L   Total Bilirubin 0.4 0.3 - 1.2 mg/dL   GFR calc non Af Amer 38 (L) >60 mL/min   GFR calc Af Amer 45 (L) >60 mL/min   Anion gap 7 5 - 15    Comment: Performed at [facility], [address] [place], [place] [address]  Troponin I - ONCE - STAT     Status: Abnormal   Collection Time: 04/05/18 11:12 PM  Result Value Ref Range   Troponin I 0.11 (HH) <0.03 ng/mL    Comment: CRITICAL RESULT CALLED TO, READ BACK BY AND VERIFIED WITH RN LISA @0003  04/06/18 AKT Performed at [facility], [address] [place], [place] [address]   Brain natriuretic peptide     Status: Abnormal   Collection Time: 04/05/18 11:12 PM  Result Value Ref Range   B Natriuretic Peptide 542.0 (H) 0.0 - 100.0 pg/mL    Comment: Performed at [facility], [address] [place], [place] [address]  Urinalysis, Complete w Microscopic     Status: Abnormal   Collection Time: 04/06/18  1:40 AM  Result Value Ref Range   Color, Urine YELLOW (A) YELLOW   APPearance CLEAR (A) CLEAR   Specific Gravity, Urine 1.014 1.005 - 1.030   pH 5.0 5.0 - 8.0   Glucose, UA NEGATIVE NEGATIVE mg/dL   Hgb urine dipstick NEGATIVE NEGATIVE   Bilirubin Urine NEGATIVE NEGATIVE   Ketones, ur NEGATIVE NEGATIVE mg/dL   Protein, ur NEGATIVE NEGATIVE mg/dL   Nitrite NEGATIVE NEGATIVE   Leukocytes, UA NEGATIVE NEGATIVE   RBC / HPF 0-5 0 - 5 RBC/hpf   WBC, UA 0-5 0 - 5 WBC/hpf   Bacteria, UA MANY (A) NONE SEEN   Squamous Epithelial / LPF  0-5 0 - 5   Mucus PRESENT     Comment: Performed at [facility], [address] [place], [place] [address]   Dg Chest 2 View  Result Date: 04/05/2018 CLINICAL DATA:  Chest pain EXAM: CHEST - 2 VIEW COMPARISON:  01/17/2018 FINDINGS: Cardiomegaly with vascular congestion and interstitial prominence which could reflect early or mild interstitial edema. No effusions. No acute bony abnormality. IMPRESSION: Cardiomegaly. Worsening interstitial prominence concerning for interstitial edema. Electronically Signed   By: Charlett Nose M.D.   On: 04/05/2018 23:36   Dg Abdomen 1 View  Result Date: 04/06/2018 CLINICAL DATA:  Mid chest pain and shortness of breath. Constipation. EXAM: ABDOMEN - 1 VIEW COMPARISON:  None. FINDINGS: The bowel gas pattern is normal. Moderate to large volume retained large bowel stool. Aortoiliac stent graft. No radio-opaque calculi or other significant radiographic abnormality are seen. Surgical clips LEFT inguinal soft tissues seen with prior vascular access. IMPRESSION: 1. Moderate to large volume retained large bowel stool. Nonobstructive bowel gas pattern. Electronically Signed   By: Awilda Metro M.D.   On: 04/06/2018 01:49    Review of Systems  Constitutional: Negative for chills and fever.  HENT: Negative for sore throat and tinnitus.   Eyes: Negative for  blurred vision and redness.  Respiratory: Positive for shortness of breath. Negative for cough.   Cardiovascular: Positive for chest pain. Negative for palpitations, orthopnea and PND.  Gastrointestinal: Negative for abdominal pain, diarrhea, nausea and vomiting.  Genitourinary: Negative for dysuria, frequency and urgency.  Musculoskeletal: Negative for joint pain and myalgias.  Skin: Negative for rash.       No lesions  Neurological: Negative for speech change, focal weakness and weakness.  Endo/Heme/Allergies: Does not bruise/bleed easily.       No temperature intolerance  Psychiatric/Behavioral:  Negative for depression and suicidal ideas.    Blood pressure 104/73, pulse 91, temperature 97.7 F (36.5 C), temperature source Oral, resp. rate 16, height 5\' 5"  (1.651 m), weight 88 kg, SpO2 94 %. Physical Exam  Vitals reviewed. Constitutional: She is oriented to person, place, and time. She appears well-developed and well-nourished. No distress.  HENT:  Head: Normocephalic and atraumatic.  Mouth/Throat: Oropharynx is clear and moist.  Eyes: Pupils are equal, round, and reactive to light. Conjunctivae and EOM are normal. No scleral icterus.  Neck: Normal range of motion. Neck supple. No JVD present. No tracheal deviation present. No thyromegaly present.  Cardiovascular: Normal rate, regular rhythm and normal heart sounds. Exam reveals no gallop and no friction rub.  No murmur heard. Respiratory: Effort normal and breath sounds normal.  GI: Soft. Bowel sounds are normal. She exhibits no distension. There is no tenderness.  Genitourinary:  Genitourinary Comments: Deferred  Musculoskeletal: Normal range of motion. She exhibits no edema.  Lymphadenopathy:    She has no cervical adenopathy.  Neurological: She is alert and oriented to person, place, and time. No cranial nerve deficit. She exhibits normal muscle tone.  Skin: Skin is warm and dry. No rash noted. No erythema.  Psychiatric: She has a normal mood and affect. Her behavior is normal. Judgment and thought content normal.     Assessment/Plan This is a 63 year old female admitted for chest pain. 1. CHF: acute on chronic; systolic. Continue Coreg and Entresto.  2. Chest pain: follow cardiac biomarkers. Monitor telemetry. Consult cardiology 3. CAD: continue aspirin and Pletal 4. Hypertension: controlled; continue to monitor 5. COPD: continue inhaled corticosteroid and Spriiva. Albuterol as needed 6. Diabetes mellitus type 2: continue basal insulin adjusted for hospital diet. Sliding scale insulin while hospitalized as well 7.   Hyperlipidemia: continue statin therapy 8. OSA: continue CPAP 9. DVT prophylaxis: heparin  10. GI prophylaxis: none Patient is a full code.  Time spent on admission orders and patient care approximately 45 minutes    Arnaldo Natal, MD 04/06/2018, 2:57 AM

## 2018-04-06 NOTE — ED Provider Notes (Signed)
[facility] Emergency Department Provider Note   ____________________________________________   First MD Initiated Contact with Patient 04/06/18 0021     (approximate)  I have reviewed the triage vital signs and the nursing notes.   HISTORY  Chief Complaint Chest Pain    HPI Alexis Ray is a 62 y.o. female who presents to the ED from home with a chief complaint of chest pain and shortness of breath.  Patient has a history of hypertension, CHF, on Lasix, COPD, CAD who has been experiencing waxing/waning chest pain and shortness of breath over the past week.  Admits to dietary indiscretion over the Thanksgiving holiday.  Concerned she might have an "infection".  Denies associated fever, chills, abdominal pain, nausea or vomiting.  States she has been constipated but had a bowel movement yesterday.  Also thinks she may have a UTI.  Denies recent travel or trauma.   Past Medical History:  Diagnosis Date  . CAD (coronary artery disease)   . Chronic combined systolic and diastolic CHF (congestive heart failure) (HCC)   . COPD (chronic obstructive pulmonary disease) (HCC)   . Diabetes mellitus without complication (HCC)   . GERD (gastroesophageal reflux disease)   . Hypertension   . PVD (peripheral vascular disease) (HCC)   . Stroke (cerebrum) [facility](HCC)     Patient Active Problem List   Diagnosis Date Noted  . Acute on chronic systolic CHF (congestive heart failure) (HCC) 04/06/2018  . HCAP (healthcare-associated pneumonia) 06/05/2016  . Acute on chronic systolic CHF (congestive heart failure), NYHA class 4 (HCC) 05/29/2016  . Chronic systolic heart failure (HCC) 06/07/2015  . Numbness in both legs 06/07/2015  . Tobacco use 06/07/2015  . GERD (gastroesophageal reflux disease) 05/23/2015  . Type 2 diabetes mellitus (HCC) 05/23/2015  . HTN (hypertension) 05/23/2015  . PVD (peripheral vascular disease) (HCC) 05/23/2015  . OSA on CPAP 05/23/2015    Past  Surgical History:  Procedure Laterality Date  . ABDOMINAL HYSTERECTOMY    . CARDIAC CATHETERIZATION    . COLONOSCOPY    . EYE SURGERY Bilateral     Prior to Admission medications   Medication Sig Start Date End Date Taking? Authorizing Provider  albuterol (PROVENTIL) (2.5 MG/3ML) 0.083% nebulizer solution Take 3 mLs (2.5 mg total) by nebulization every 6 (six) hours as needed for wheezing or shortness of breath. 05/26/15   Altamese DillingVachhani, Vaibhavkumar, MD  albuterol (VENTOLIN HFA) 108 (90 Base) MCG/ACT inhaler Inhale into the lungs every 6 (six) hours as needed for wheezing or shortness of breath.    [provider]  aspirin EC 81 MG tablet Take 81 mg by mouth daily.    [provider]  budesonide-formoterol (SYMBICORT) 160-4.5 MCG/ACT inhaler Inhale 2 puffs into the lungs 2 (two) times daily.    [provider]  carvedilol (COREG) 3.125 MG tablet Take 1 tablet (3.125 mg total) by mouth 2 (two) times daily with a meal. 05/30/16   Altamese DillingVachhani, Vaibhavkumar, MD  Cholecalciferol 10000 units CAPS Take 1,000 Units by mouth daily.    [provider]  cilostazol (PLETAL) 100 MG tablet Take 100 mg by mouth 2 (two) times daily.    [provider]  esomeprazole (NEXIUM) 40 MG capsule Take 40 mg by mouth daily at 12 noon.    [provider]  ferrous sulfate 325 (65 FE) MG tablet Take 325 mg by mouth daily with breakfast.    [provider]  furosemide (LASIX) 40 MG tablet Take 1 tablet (40  mg total) by mouth 2 (two) times daily. 05/30/16   Altamese Dilling, MD  HYDROcodone-acetaminophen (NORCO/VICODIN) 5-325 MG tablet Take 1 tablet by mouth every 6 (six) hours as needed for moderate pain. 05/30/16   Altamese Dilling, MD  insulin aspart (NOVOLOG) 100 UNIT/ML injection Inject 10 Units into the skin 3 (three) times daily with meals.    [provider]  insulin glargine (LANTUS) 100 UNIT/ML injection Inject 40 Units into the skin at  bedtime.    [provider]  levocetirizine (XYZAL) 5 MG tablet Take 5 mg by mouth at bedtime.     [provider]  metFORMIN (GLUCOPHAGE) 500 MG tablet Take 500 mg by mouth 2 (two) times daily with a meal.     [provider]  Multiple Vitamins-Minerals (SENTRY SENIOR) TABS Take 1 tablet by mouth daily.    [provider]  nitroGLYCERIN (NITROSTAT) 0.4 MG SL tablet Place 0.4 mg under the tongue every 5 (five) minutes as needed for chest pain.    [provider]  pramipexole (MIRAPEX) 0.125 MG tablet Take 1 tablet by mouth daily. 03/12/16   [provider]  predniSONE (DELTASONE) 20 MG tablet Take 2 tablets (40 mg total) by mouth daily with breakfast. 01/17/18   Schaevitz, Myra Rude, MD  pregabalin (LYRICA) 150 MG capsule Take 150 mg by mouth 3 (three) times daily.     [provider]  rosuvastatin (CRESTOR) 40 MG tablet Take 40 mg by mouth daily.    [provider]  sacubitril-valsartan (ENTRESTO) 24-26 MG Take 1 tablet by mouth 2 (two) times daily. 07/12/15   Delma Freeze, FNP  spironolactone (ALDACTONE) 50 MG tablet Take 50 mg by mouth daily.    [provider]  tiotropium (SPIRIVA) 18 MCG inhalation capsule Place 18 mcg into inhaler and inhale daily.    [provider]  triamcinolone cream (KENALOG) 0.1 % Apply 1 application topically 2 (two) times daily. To affected area 05/06/16   [provider]  Venlafaxine HCl 225 MG TB24 Take 225 mg by mouth daily.     [provider]  VICTOZA 18 MG/3ML SOPN Inject 1.8 mg into the skin daily. 05/27/16   [provider]  vitamin C (ASCORBIC ACID) 500 MG tablet Take 500 mg by mouth daily.    [provider]    Allergies Colchicine; Sulfa antibiotics; and Levaquin [levofloxacin in d5w]  Family History  Problem Relation Age of Onset  . Diabetes Mother   . Cancer Father   . Diabetes Maternal Aunt   . Diabetes Maternal Aunt      Social History Social History   Tobacco Use  . Smoking status: Current Every Day Smoker    Packs/day: 1.00    Types: Cigarettes  . Smokeless tobacco: Never Used  Substance Use Topics  . Alcohol use: No    Alcohol/week: 0.0 standard drinks  . Drug use: No    Review of Systems  Constitutional: No fever/chills Eyes: No visual changes. ENT: No sore throat. Cardiovascular: Positive for chest pain. Respiratory: Positive for shortness of breath. Gastrointestinal: No abdominal pain.  No nausea, no vomiting.  No diarrhea.  Positive for constipation. Genitourinary: Positive for dysuria. Musculoskeletal: Negative for back pain. Skin: Negative for rash. Neurological: Negative for headaches, focal weakness or numbness.   ____________________________________________   PHYSICAL EXAM:  VITAL SIGNS: ED Triage Vitals  Enc Vitals Group     BP 04/05/18 2301 103/65     Pulse Rate 04/05/18 2301 89  Resp 04/05/18 2301 18     Temp 04/05/18 2301 98 F (36.7 C)     Temp Source 04/05/18 2301 Oral     SpO2 04/05/18 2301 95 %     Weight 04/05/18 2251 194 lb (88 kg)     Height 04/05/18 2251 5\' 5"  (1.651 m)     Head Circumference --      Peak Flow --      Pain Score 04/05/18 2251 5     Pain Loc --      Pain Edu? --      Excl. in GC? --     Constitutional: Alert and oriented. Well appearing and in mild acute distress. Eyes: Conjunctivae are normal. PERRL. EOMI. Head: Atraumatic. Nose: No congestion/rhinnorhea. Mouth/Throat: Mucous membranes are moist.  Oropharynx non-erythematous. Neck: No stridor.   Cardiovascular: Normal rate, regular rhythm. Grossly normal heart sounds.  Good peripheral circulation. Respiratory: Normal respiratory effort.  No retractions. Lungs with bibasilar rales. Gastrointestinal: Soft and nontender to light or deep palpation. No distention. No abdominal bruits. No CVA tenderness. Musculoskeletal: No lower extremity tenderness nor edema.  No joint  effusions. Neurologic:  Normal speech and language. No gross focal neurologic deficits are appreciated. No gait instability. Skin:  Skin is warm, dry and intact. No rash noted. Psychiatric: Mood and affect are normal. Speech and behavior are normal.  ____________________________________________   LABS (all labs ordered are listed, but only abnormal results are displayed)  Labs Reviewed  CBC WITH DIFFERENTIAL/PLATELET - Abnormal; Notable for the following components:      Result Value   RDW 15.8 (*)    All other components within normal limits  COMPREHENSIVE METABOLIC PANEL - Abnormal; Notable for the following components:   Glucose, Bld 117 (*)    Creatinine, Ser 1.46 (*)    Calcium 8.7 (*)    GFR calc non Af Amer 38 (*)    GFR calc Af Amer 45 (*)    All other components within normal limits  TROPONIN I - Abnormal; Notable for the following components:   Troponin I 0.11 (*)    All other components within normal limits  BRAIN NATRIURETIC PEPTIDE - Abnormal; Notable for the following components:   B Natriuretic Peptide 542.0 (*)    All other components within normal limits  URINALYSIS, COMPLETE (UACMP) WITH MICROSCOPIC - Abnormal; Notable for the following components:   Color, Urine YELLOW (*)    APPearance CLEAR (*)    Bacteria, UA MANY (*)    All other components within normal limits   ____________________________________________  EKG  ED ECG REPORT I, , J, the attending physician, personally viewed and interpreted this ECG.   Date: 04/06/2018  EKG Time: 2306  Rate: 93  Rhythm: normal EKG, normal sinus rhythm  Axis: Normal  Intervals:none  ST&T Change: Nonspecific  ____________________________________________  RADIOLOGY  ED MD interpretation: CHF  Official radiology report(s): Dg Chest 2 View  Result Date: 04/05/2018 CLINICAL DATA:  Chest pain EXAM: CHEST - 2 VIEW COMPARISON:  01/17/2018 FINDINGS: Cardiomegaly with vascular congestion and interstitial  prominence which could reflect early or mild interstitial edema. No effusions. No acute bony abnormality. IMPRESSION: Cardiomegaly. Worsening interstitial prominence concerning for interstitial edema. Electronically Signed   By: Charlett Nose M.D.   On: 04/05/2018 23:36   Dg Abdomen 1 View  Result Date: 04/06/2018 CLINICAL DATA:  Mid chest pain and shortness of breath. Constipation. EXAM: ABDOMEN - 1 VIEW COMPARISON:  None. FINDINGS: The bowel gas pattern is normal.  Moderate to large volume retained large bowel stool. Aortoiliac stent graft. No radio-opaque calculi or other significant radiographic abnormality are seen. Surgical clips LEFT inguinal soft tissues seen with prior vascular access. IMPRESSION: 1. Moderate to large volume retained large bowel stool. Nonobstructive bowel gas pattern. Electronically Signed   By: Awilda Metro M.D.   On: 04/06/2018 01:49    ____________________________________________   PROCEDURES  Procedure(s) performed: None  Procedures  Critical Care performed: Yes, see critical care note(s)  CRITICAL CARE Performed by: Irean Hong   Total critical care time: 45 minutes  Critical care time was exclusive of separately billable procedures and treating other patients.  Critical care was necessary to treat or prevent imminent or life-threatening deterioration.  Critical care was time spent personally by me on the following activities: development of treatment plan with patient and/or surrogate as well as nursing, discussions with consultants, evaluation of patient's response to treatment, examination of patient, obtaining history from patient or surrogate, ordering and performing treatments and interventions, ordering and review of laboratory studies, ordering and review of radiographic studies, pulse oximetry and re-evaluation of patient's condition. ____________________________________________   INITIAL IMPRESSION / ASSESSMENT AND PLAN / ED COURSE  As  part of my medical decision making, I reviewed the following data within the electronic MEDICAL RECORD NUMBER History obtained from family, Nursing notes reviewed and incorporated, Labs reviewed, EKG interpreted, Old chart reviewed, Radiograph reviewed, Discussed with admitting physician and Notes from prior ED visits   62 year old female with CHF who presents with chest pain and shortness of breath. Differential includes, but is not limited to, viral syndrome, bronchitis including COPD exacerbation, pneumonia, reactive airway disease including asthma, CHF including exacerbation with or without pulmonary/interstitial edema, pneumothorax, ACS, thoracic trauma, and pulmonary embolism.   Clinical Course as of Apr 06 504  Tue Apr 06, 2018  1610 Patient ambulated with pulse oximeter; room air saturations dipped to 85% and she was tachypneic by the time she got back in bed.  O2 nasal cannula placed.  Will administer aspirin, morphine, Lasix and discuss with hospitalist to evaluate patient in emergency department for admission.   [JS]  0121 Patient with soft blood pressure currently; will hold morphine.   [JS]    Clinical Course User Index [JS] Irean Hong, MD     ____________________________________________   FINAL CLINICAL IMPRESSION(S) / ED DIAGNOSES  Final diagnoses:  Chest pain, unspecified type  Acute on chronic congestive heart failure, unspecified heart failure type (HCC)  Hypoxia  Elevated troponin     ED Discharge Orders    None       Note:  This document was prepared using Dragon voice recognition software and may include unintentional dictation errors.    Irean Hong, MD 04/06/18 [id number]

## 2018-04-07 ENCOUNTER — Inpatient Hospital Stay: Payer: Medicare HMO

## 2018-04-07 ENCOUNTER — Inpatient Hospital Stay
Admit: 2018-04-07 | Discharge: 2018-04-07 | Disposition: A | Discharge status: Home / Self Care | Payer: Medicare HMO | Attending: Cardiovascular Disease | Admitting: Cardiovascular Disease

## 2018-04-07 LAB — BASIC METABOLIC PANEL
Anion gap: 7 (ref 5–15)
BUN: 18 mg/dL (ref 8–23)
CO2: 30 mmol/L (ref 22–32)
Calcium: 9 mg/dL (ref 8.9–10.3)
Chloride: 105 mmol/L (ref 98–111)
Creatinine, Ser: 1.36 mg/dL — ABNORMAL HIGH (ref 0.44–1.00)
GFR calc Af Amer: 49 mL/min — ABNORMAL LOW (ref >60)
GFR calc non Af Amer: 42 mL/min — ABNORMAL LOW (ref >60)
GLUCOSE: 126 mg/dL — AB (ref 70–99)
Potassium: 4.5 mmol/L (ref 3.5–5.1)
Sodium: 142 mmol/L (ref 135–145)

## 2018-04-07 LAB — CBC
HCT: 45.2 % (ref 36.0–46.0)
Hemoglobin: 13.7 g/dL (ref 12.0–15.0)
MCH: 27 pg (ref 26.0–34.0)
MCHC: 30.3 g/dL (ref 30.0–36.0)
MCV: 89 fL (ref 80.0–100.0)
Platelets: 218 10*3/uL (ref 150–400)
RBC: 5.08 MIL/uL (ref 3.87–5.11)
RDW: 15.5 % (ref 11.5–15.5)
WBC: 6.8 10*3/uL (ref 4.0–10.5)
nRBC: 0 % (ref 0.0–0.2)

## 2018-04-07 LAB — GLUCOSE, CAPILLARY
Glucose-Capillary: 114 mg/dL — ABNORMAL HIGH (ref 70–99)
Glucose-Capillary: 226 mg/dL — ABNORMAL HIGH (ref 70–99)
Glucose-Capillary: 188 mg/dL — ABNORMAL HIGH (ref 70–99)
Glucose-Capillary: 183 mg/dL — ABNORMAL HIGH (ref 70–99)

## 2018-04-07 MED ORDER — VENLAFAXINE HCL ER 75 MG PO CP24
150.0000 mg | ORAL_CAPSULE | Freq: Every day | ORAL | Status: DC
  Administered 2018-04-07 – 2018-04-09 (×3): 150 mg via ORAL
  Filled 2018-04-07 (×3): qty 2

## 2018-04-07 MED ORDER — PREGABALIN 75 MG PO CAPS
75.0000 mg | ORAL_CAPSULE | Freq: Three times a day (TID) | ORAL | Status: DC
  Administered 2018-04-07 – 2018-04-09 (×5): 75 mg via ORAL
  Filled 2018-04-07 (×5): qty 1

## 2018-04-07 MED ORDER — ZOLPIDEM TARTRATE 5 MG PO TABS
5.0000 mg | ORAL_TABLET | Freq: Every evening | ORAL | Status: DC | PRN
  Administered 2018-04-07: 5 mg via ORAL
  Filled 2018-04-07: qty 1

## 2018-04-07 MED ORDER — ADULT MULTIVITAMIN W/MINERALS CH
1.0000 | ORAL_TABLET | Freq: Every day | ORAL | Status: DC
  Administered 2018-04-07 – 2018-04-09 (×2): 1 via ORAL
  Filled 2018-04-07 (×2): qty 1

## 2018-04-07 MED ORDER — POLYETHYLENE GLYCOL 3350 17 G PO PACK
17.0000 g | PACK | Freq: Every day | ORAL | Status: DC
  Administered 2018-04-07 – 2018-04-09 (×2): 17 g via ORAL
  Filled 2018-04-07 (×2): qty 1

## 2018-04-07 MED ORDER — SODIUM CHLORIDE 0.9 % IV SOLN
INTRAVENOUS | Status: DC
  Administered 2018-04-07: 17:00:00 via INTRAVENOUS

## 2018-04-07 MED ORDER — PRAMIPEXOLE DIHYDROCHLORIDE 0.25 MG PO TABS
0.1250 mg | ORAL_TABLET | Freq: Every day | ORAL | Status: DC
  Administered 2018-04-07 – 2018-04-08 (×2): 0.125 mg via ORAL
  Filled 2018-04-07 (×2): qty 1

## 2018-04-07 MED ORDER — GLUCERNA SHAKE PO LIQD
237.0000 mL | Freq: Three times a day (TID) | ORAL | Status: DC
  Administered 2018-04-07 – 2018-04-08 (×3): 237 mL via ORAL

## 2018-04-07 NOTE — Progress Notes (Signed)
*  PRELIMINARY RESULTS* Echocardiogram 2D Echocardiogram has been performed.  Joanette GulaJoan M Louay Myrie 04/07/2018, 1:57 PM

## 2018-04-07 NOTE — Consult Note (Signed)
[facility] CONSULT NOTE    Date: 04/07/2018                  Patient Name:  Alexis Ray  MRN: [id number]  DOB: 03/13/1956  Age / Sex: 62 y.o., female         PCP: Keane Police, MD                 Service Requesting Consult: Hospitalist                 Reason for Consult: Chronic kidney disease stage III with plans for cardiac catherization.             History of Present Illness: Patient is a 62 y.o. female with a PMHx of coronary artery disease, COPD, diabetes mellitus type 2, hypertension, chronic kidney disease stage III baseline creatinine 1.3, who was admitted to [facility] on 04/06/2018 for evaluation of shortness of breath and chest pain.  She states that she has had shortness of breath over the past week.  She is also had some intermittent chest pain and tightness.  Troponin was elevated at 0.1.  Therefore cardiac catheterization has been planned.  Patient does follow with [facility] nephrology for her underlying chronic kidney disease.  It appears that her baseline creatinine is 1.3.  She currently denies nausea, vomiting, or dysgeusia.  Patient has history of CHF with most recent ejection fraction of 30% that is available for review to [place] at the moment.   Medications: Outpatient medications: Medications Prior to Admission  Medication Sig Dispense Refill Last Dose  . albuterol (PROVENTIL) (2.5 MG/3ML) 0.083% nebulizer solution Take 3 mLs (2.5 mg total) by nebulization every 6 (six) hours as needed for wheezing or shortness of breath. 75 mL 2 unknown at unknown  . albuterol (VENTOLIN HFA) 108 (90 Base) MCG/ACT inhaler Inhale 2 puffs into the lungs every 6 (six) hours as needed for wheezing or shortness of breath.    unknown at unknown  . aspirin EC 81 MG tablet Take 81 mg by mouth daily.   04/05/2018 at am  . budesonide-formoterol (SYMBICORT) 160-4.5 MCG/ACT inhaler Inhale 2 puffs into the lungs 2 (two) times daily.   04/05/2018 at pm  . carvedilol (COREG) 3.125  MG tablet Take 1 tablet (3.125 mg total) by mouth 2 (two) times daily with a meal. 60 tablet 0 04/05/2018 at 1600  . cholecalciferol (VITAMIN D3) 25 MCG (1000 UT) tablet Take 1,000 Units by mouth daily.   04/05/2018 at am  . cilostazol (PLETAL) 100 MG tablet Take 100 mg by mouth 2 (two) times daily.   04/05/2018 at pm  . Dulaglutide 1.5 MG/0.5ML SOPN Inject 1.5 mg into the skin every Monday.   04/05/2018 at am  . esomeprazole (NEXIUM) 40 MG capsule Take 40 mg by mouth daily.    04/05/2018 at am  . ferrous sulfate 325 (65 FE) MG tablet Take 325 mg by mouth daily with breakfast.   04/05/2018 at am  . fluticasone (FLONASE) 50 MCG/ACT nasal spray Place 1 spray into both nostrils 2 (two) times daily.   04/05/2018 at pm  . furosemide (LASIX) 40 MG tablet Take 1 tablet (40 mg total) by mouth 2 (two) times daily. 30 tablet 0 04/05/2018 at pm  . insulin aspart (NOVOLOG) 100 UNIT/ML injection Inject 10 Units into the skin 3 (three) times daily with meals.   04/05/2018 at pm  . insulin glargine (LANTUS) 100 UNIT/ML injection Inject 45 Units into  the skin at bedtime.    04/05/2018 at pm  . levocetirizine (XYZAL) 5 MG tablet Take 5 mg by mouth at bedtime.    04/05/2018 at pm  . metFORMIN (GLUCOPHAGE) 500 MG tablet Take 500 mg by mouth 2 (two) times daily with a meal.    04/05/2018 at pm  . Multiple Vitamins-Minerals (SENTRY SENIOR) TABS Take 1 tablet by mouth daily.   04/05/2018 at am  . nitroGLYCERIN (NITROSTAT) 0.4 MG SL tablet Place 0.4 mg under the tongue every 5 (five) minutes as needed for chest pain.   unknown at unknown  . pramipexole (MIRAPEX) 0.125 MG tablet Take 0.125 mg by mouth at bedtime.    04/05/2018 at pm  . pregabalin (LYRICA) 150 MG capsule Take 150 mg by mouth 3 (three) times daily.    04/05/2018 at pm  . rosuvastatin (CRESTOR) 40 MG tablet Take 40 mg by mouth daily.   04/05/2018 at am  . sacubitril-valsartan (ENTRESTO) 24-26 MG Take 1 tablet by mouth 2 (two) times daily. 60 tablet 0 04/05/2018 at am  .  spironolactone (ALDACTONE) 25 MG tablet Take 12.5 mg by mouth daily.   04/05/2018 at am  . Tiotropium Bromide Monohydrate (SPIRIVA RESPIMAT) 2.5 MCG/ACT AERS Inhale 2 puffs into the lungs daily.   04/05/2018 at am  . Venlafaxine HCl 225 MG TB24 Take 225 mg by mouth daily.    04/05/2018 at am  . vitamin C (ASCORBIC ACID) 500 MG tablet Take 500 mg by mouth daily.   04/05/2018 at am  . predniSONE (DELTASONE) 20 MG tablet Take 2 tablets (40 mg total) by mouth daily with breakfast. (Patient not taking: Reported on 04/06/2018) 10 tablet 0 Not Taking at Unknown time    Current medications: Current Facility-Administered Medications  Medication Dose Route Frequency Provider Last Rate Last Dose  . 0.9 %  sodium chloride infusion   Intravenous Continuous Kaimen Peine, MD      . acetaminophen (TYLENOL) tablet 650 mg  650 mg Oral Q6H PRN Arnaldo Natal, MD       Or  . acetaminophen (TYLENOL) suppository 650 mg  650 mg Rectal Q6H PRN Arnaldo Natal, MD      . albuterol (PROVENTIL) (2.5 MG/3ML) 0.083% nebulizer solution 2.5 mg  2.5 mg Nebulization Q6H PRN Arnaldo Natal, MD   2.5 mg at 04/07/18 0810  . aspirin EC tablet 81 mg  81 mg Oral Daily Arnaldo Natal, MD   81 mg at 04/07/18 0847  . carvedilol (COREG) tablet 3.125 mg  3.125 mg Oral BID WC Arnaldo Natal, MD   3.125 mg at 04/07/18 0847  . cilostazol (PLETAL) tablet 100 mg  100 mg Oral BID Arnaldo Natal, MD   100 mg at 04/07/18 0847  . docusate sodium (COLACE) capsule 100 mg  100 mg Oral BID Arnaldo Natal, MD   100 mg at 04/07/18 0846  . fluticasone furoate-vilanterol (BREO ELLIPTA) 200-25 MCG/INH 1 puff  1 puff Inhalation Daily Arnaldo Natal, MD   1 puff at 04/07/18 0848  . heparin injection 5,000 Units  5,000 Units Subcutaneous Q8H Arnaldo Natal, MD   5,000 Units at 04/07/18 0556  . HYDROcodone-acetaminophen (NORCO/VICODIN) 5-325 MG per tablet 1 tablet  1 tablet Oral Q6H PRN Arnaldo Natal, MD      . insulin  aspart (novoLOG) injection 0-15 Units  0-15 Units Subcutaneous TID WC Arnaldo Natal, MD   5 Units at 04/06/18 1212  . insulin glargine (  LANTUS) injection 20 Units  20 Units Subcutaneous QHS Gouru, Aruna, MD   20 Units at 04/06/18 2211  . nitroGLYCERIN (NITROSTAT) SL tablet 0.4 mg  0.4 mg Sublingual Q5 min PRN Arnaldo Natal, MD      . ondansetron [facility]) tablet 4 mg  4 mg Oral Q6H PRN Arnaldo Natal, MD       Or  . ondansetron [facility]) injection 4 mg  4 mg Intravenous Q6H PRN Arnaldo Natal, MD      . rosuvastatin (CRESTOR) tablet 40 mg  40 mg Oral Daily Arnaldo Natal, MD   40 mg at 04/06/18 1814  . sodium chloride flush (NS) 0.9 % injection 3 mL  3 mL Intravenous Q12H Gouru, Aruna, MD   3 mL at 04/07/18 0849  . tiotropium (SPIRIVA) inhalation capsule ([facility] use ONLY) 18 mcg  18 mcg Inhalation Daily Arnaldo Natal, MD   18 mcg at 04/07/18 1610      Allergies: Allergies  Allergen Reactions  . Colchicine     itching  . Sulfa Antibiotics Rash    itching  . Levaquin [Levofloxacin In D5w] Rash      Past Medical History: Past Medical History:  Diagnosis Date  . CAD (coronary artery disease)   . Chronic combined systolic and diastolic CHF (congestive heart failure) (HCC)   . COPD (chronic obstructive pulmonary disease) (HCC)   . Diabetes mellitus without complication (HCC)   . GERD (gastroesophageal reflux disease)   . Hypertension   . PVD (peripheral vascular disease) (HCC)   . Stroke (cerebrum) [facility])      Past Surgical History: Past Surgical History:  Procedure Laterality Date  . ABDOMINAL HYSTERECTOMY    . CARDIAC CATHETERIZATION    . COLONOSCOPY    . EYE SURGERY Bilateral      Family History: Family History  Problem Relation Age of Onset  . Diabetes Mother   . Cancer Father   . Diabetes Maternal Aunt   . Diabetes Maternal Aunt      Social History: Social History   Socioeconomic History  . Marital status: Single    Spouse name: Not  on file  . Number of children: Not on file  . Years of education: Not on file  . Highest education level: Not on file  Occupational History  . Not on file  Social Needs  . Financial resource strain: Not on file  . Food insecurity:    Worry: Not on file    Inability: Not on file  . Transportation needs:    Medical: Not on file    Non-medical: Not on file  Tobacco Use  . Smoking status: Current Every Day Smoker    Packs/day: 1.00    Types: Cigarettes  . Smokeless tobacco: Never Used  Substance and Sexual Activity  . Alcohol use: No    Alcohol/week: 0.0 standard drinks  . Drug use: No  . Sexual activity: Never  Lifestyle  . Physical activity:    Days per week: Not on file    Minutes per session: Not on file  . Stress: Not on file  Relationships  . Social connections:    Talks on phone: Not on file    Gets together: Not on file    Attends religious service: Not on file    Active member of club or organization: Not on file    Attends meetings of clubs or organizations: Not on file    Relationship status: Not on file  .  Intimate partner violence:    Fear of current or ex partner: Not on file    Emotionally abused: Not on file    Physically abused: Not on file    Forced sexual activity: Not on file  Other Topics Concern  . Not on file  Social History Narrative  . Not on file     Review of Systems: Review of Systems  Constitutional: Negative for chills, fever and malaise/fatigue.  HENT: Negative for congestion, hearing loss and tinnitus.   Eyes: Negative for blurred vision and double vision.  Respiratory: Positive for cough, hemoptysis and shortness of breath.   Cardiovascular: Positive for chest pain and orthopnea.  Gastrointestinal: Negative for abdominal pain, heartburn, nausea and vomiting.  Genitourinary: Positive for dysuria. Negative for hematuria.  Skin: Negative for itching and rash.  Neurological: Negative for dizziness and focal weakness.   Endo/Heme/Allergies: Negative for polydipsia. Does not bruise/bleed easily.  Psychiatric/Behavioral: Negative for depression. The patient is not nervous/anxious.      Vital Signs: Blood pressure 138/83, pulse (!) 102, temperature 97.8 F (36.6 C), temperature source Oral, resp. rate (!) 22, height 5\' 5"  (1.651 m), weight 88.1 kg, SpO2 99 %.  Weight trends: Filed Weights   04/05/18 2251 04/07/18 0518  Weight: 88 kg 88.1 kg    Physical Exam: General: NAD, sitting up  Head: Normocephalic, atraumatic.  Eyes: Anicteric, EOMI  Nose: Mucous membranes moist, not inflammed, nonerythematous.  Throat: Oropharynx nonerythematous, no exudate appreciated.   Neck: Supple, trachea midline.  Lungs:  Normal respiratory effort. Clear to auscultation BL without crackles or wheezes.  Heart: RRR. S1 and S2 normal without gallop, murmur, or rubs.  Abdomen:  BS normoactive. Soft, Nondistended, non-tender.  No masses or organomegaly.  Extremities: No pretibial edema.  Neurologic: A&O X3, Motor strength is 5/5 in the all 4 extremities  Skin: No visible rashes, scars.    Lab results: Basic Metabolic Panel: Recent Labs  Lab 04/05/18 2312 04/06/18 1247 04/07/18 0352  NA 140 139 142  K 4.1 4.9 4.5  CL 103 101 105  CO2 30 27 30   GLUCOSE 117* 194* 126*  BUN 18 17 18   CREATININE 1.46* 1.33* 1.36*  CALCIUM 8.7* 8.6* 9.0    Liver Function Tests: Recent Labs  Lab 04/05/18 2312  AST 16  ALT 11  ALKPHOS 44  BILITOT 0.4  PROT 7.0  ALBUMIN 3.6   No results for input(s): LIPASE, AMYLASE in the last 168 hours. No results for input(s): AMMONIA in the last 168 hours.  CBC: Recent Labs  Lab 04/05/18 2312 04/06/18 1247 04/07/18 0352  WBC 6.5 5.7 6.8  NEUTROABS 4.4  --   --   HGB 12.6 13.4 13.7  HCT 40.6 43.1 45.2  MCV 87.9 87.8 89.0  PLT 196 178 218    Cardiac Enzymes: Recent Labs  Lab 04/05/18 2312 04/06/18 0603 04/06/18 1200 04/06/18 1808  TROPONINI 0.11* 0.14* 0.10* 0.10*     BNP: Invalid input(s): POCBNP  CBG: Recent Labs  Lab 04/06/18 1617 04/06/18 1646 04/06/18 1717 04/06/18 2125 04/07/18 0802  GLUCAP 33* 76 50* 284* 114*    Microbiology: Results for orders placed or performed during the hospital encounter of 06/05/16  MRSA PCR Screening     Status: None   Collection Time: 06/06/16  3:25 PM  Result Value Ref Range Status   MRSA by PCR NEGATIVE NEGATIVE Final    Comment:        The GeneXpert MRSA Assay (FDA approved for NASAL specimens  only), is one component of a comprehensive MRSA colonization surveillance program. It is not intended to diagnose MRSA infection nor to guide or monitor treatment for MRSA infections.     Coagulation Studies: Recent Labs    04/06/18 1247  LABPROT 12.3  INR 0.92    Urinalysis: Recent Labs    04/06/18 0140  COLORURINE YELLOW*  LABSPEC 1.014  PHURINE 5.0  GLUCOSEU NEGATIVE  HGBUR NEGATIVE  BILIRUBINUR NEGATIVE  KETONESUR NEGATIVE  PROTEINUR NEGATIVE  NITRITE NEGATIVE  LEUKOCYTESUR NEGATIVE      Imaging: Dg Chest 2 View  Result Date: 04/05/2018 CLINICAL DATA:  Chest pain EXAM: CHEST - 2 VIEW COMPARISON:  01/17/2018 FINDINGS: Cardiomegaly with vascular congestion and interstitial prominence which could reflect early or mild interstitial edema. No effusions. No acute bony abnormality. IMPRESSION: Cardiomegaly. Worsening interstitial prominence concerning for interstitial edema. Electronically Signed   By: Charlett Nose M.D.   On: 04/05/2018 23:36   Dg Abdomen 1 View  Result Date: 04/06/2018 CLINICAL DATA:  Mid chest pain and shortness of breath. Constipation. EXAM: ABDOMEN - 1 VIEW COMPARISON:  None. FINDINGS: The bowel gas pattern is normal. Moderate to large volume retained large bowel stool. Aortoiliac stent graft. No radio-opaque calculi or other significant radiographic abnormality are seen. Surgical clips LEFT inguinal soft tissues seen with prior vascular access. IMPRESSION: 1.  Moderate to large volume retained large bowel stool. Nonobstructive bowel gas pattern. Electronically Signed   By: Awilda Metro M.D.   On: 04/06/2018 01:49      Assessment & Plan: Pt is a 62 y.o. female with a PMHx of coronary artery disease, COPD, diabetes mellitus type 2, hypertension, chronic kidney disease stage III baseline creatinine 1.3, who was admitted to [facility] on 04/06/2018 for evaluation of shortness of breath and chest pain.   1.  Chronic kidney disease stage III, baseline Cr 1.3 2.  Hypertension. 3.  Chest pain with hx of CAD/CHF w rEF 30% 4.  Diabetes mellitus type 2 with CKD.   Plan: We are asked to see the patient for known chronic kidney disease with plans for cardiac catheterization.  Patient does have history of congestive heart failure but does not appear to be in acute exacerbation at the moment.  We will proceed with IV fluid hydration using 0.9 normal saline at 75 cc/h.  Monitor renal function closely after cardiac catheterization.  Recommend minimizing dye load as possible and avoid LV gram.  Further plan as patient progresses.

## 2018-04-07 NOTE — Plan of Care (Signed)
  Problem: Activity: Goal: Risk for activity intolerance will decrease Outcome: Progressing   Problem: Elimination: Goal: Will not experience complications related to bowel motility Outcome: Progressing Goal: Will not experience complications related to urinary retention Outcome: Progressing   Problem: Safety: Goal: Ability to remain free from injury will improve Outcome: Progressing   Problem: Activity: Goal: Capacity to carry out activities will improve Outcome: Progressing   

## 2018-04-07 NOTE — Progress Notes (Addendum)
Order placed by nephrology to begin normal saline 5275ml/hr.  This RN spoke to hospitalist, Dr. Almon RegisterGuoru and at this time, holding off on starting fluids until Dr. Almon RegisterGuoru speaks to nephrologist. 1645-Call placed to Dr. Almon RegisterGuoru.  Patient post CXR (per Dr. Almon RegisterGuoru, wanted CXR before beginning  IVF).  Dr. Almon RegisterGuoru spoke to nephrologist and will start fluids at this time.  Will stop if patient becomes SOB or hypoxic.

## 2018-04-07 NOTE — Progress Notes (Signed)
Initial Nutrition Assessment  DOCUMENTATION CODES:   Obesity unspecified  INTERVENTION:   Glucerna Shake po TID, each supplement provides 220 kcal and 10 grams of protein  MVI daily  NUTRITION DIAGNOSIS:   Increased nutrient needs related to chronic illness(CHF, DM, COPD) as evidenced by increased estimated needs.  GOAL:   Patient will meet greater than or equal to 90% of their needs  MONITOR:   PO intake, Supplement acceptance, Labs, Weight trends, Skin, I & O's  REASON FOR ASSESSMENT:   Malnutrition Screening Tool    ASSESSMENT:   62 y.o. female with a PMHx of coronary artery disease, COPD, diabetes mellitus type 2, hypertension, chronic kidney disease stage III baseline creatinine 1.3, who was admitted to [facility] on 04/06/2018 for evaluation of shortness of breath and chest pain.    Met with pt in room today. Pt reports good appetite and oral intake pta. Pt reports that her appetite was good yesterday but reports that today she is nauseas and feels constipated. Pt documented to be eating 75-95% of meals in hospital. Per chart, pt with weight gain pta. RD will order supplements and MVI to help pt meet her estimated needs.   Medications reviewed and include: aspirin, colace, heparin, insulin, miralax, NaCl _0 /hr  Labs reviewed: creat 1.36(H) cbgs- 76, 50, 284, 114, 226 x 24hrs AIC 8.2(H)- 12/3  NUTRITION - FOCUSED PHYSICAL EXAM:    Most Recent Value  Orbital Region  No depletion  Upper Arm Region  No depletion  Thoracic and Lumbar Region  No depletion  Buccal Region  No depletion  Temple Region  No depletion  Clavicle Bone Region  No depletion  Clavicle and Acromion Bone Region  No depletion  Scapular Bone Region  No depletion  Dorsal Hand  No depletion  Patellar Region  No depletion  Anterior Thigh Region  No depletion  Posterior Calf Region  No depletion  Edema (RD Assessment)  None  Hair  Reviewed  Eyes  Reviewed  Mouth  Reviewed  Skin  Reviewed  Nails   Reviewed     Diet Order:   Diet Order            Diet heart healthy/carb modified Room service appropriate? Yes; Fluid consistency: Thin  Diet effective now             EDUCATION NEEDS:   Education needs have been addressed  Skin:  Skin Assessment: Reviewed RN Assessment  Last BM:  12/1  Height:   Ht Readings from Last 1 Encounters:  04/05/18 _1  (1.651 m)    Weight:   Wt Readings from Last 1 Encounters:  04/07/18 88.1 kg    Ideal Body Weight:  56.8 kg  BMI:  Body mass index is 32.32 kg/m.  Estimated Nutritional Needs:   Kcal:  1700-1900kcal/day   Protein:  88-97g/day   Fluid:  >1.4L/day   Koleen Distance MS, RD, LDN Pager #- [phone] Office#- [phone] After Hours Pager: [phone]

## 2018-04-07 NOTE — Progress Notes (Signed)
[facility] [place] at [facility]   PATIENT NAME: Alexis GaribaldiDebra Ray    MR#:  [id number]  DATE OF BIRTH:  09-Dec-1955  SUBJECTIVE:  CHIEF COMPLAINT: Patient denies any chest pain reports she is feeling better today  REVIEW OF SYSTEMS:  CONSTITUTIONAL: No fever, fatigue or weakness.  EYES: No blurred or double vision.  EARS, NOSE, AND THROAT: No tinnitus or ear pain.  RESPIRATORY: No cough, reports shortness of breath, denies wheezing or hemoptysis.  CARDIOVASCULAR: No chest pain, orthopnea, edema.  GASTROINTESTINAL: No nausea, vomiting, diarrhea or abdominal pain.  GENITOURINARY: No dysuria, hematuria.  ENDOCRINE: No polyuria, nocturia,  MUSCULOSKELETAL: No joint pain or arthritis.   NEUROLOGIC: No tingling, numbness, weakness.  PSYCHIATRY: No anxiety or depression.   DRUG ALLERGIES:   Allergies  Allergen Reactions  . Colchicine     itching  . Sulfa Antibiotics Rash    itching  . Levaquin [Levofloxacin In D5w] Rash    VITALS:  Blood pressure 138/83, pulse (!) 102, temperature 97.8 F (36.6 C), temperature source Oral, resp. rate (!) 22, height 5\' 5"  (1.651 m), weight 88.1 kg, SpO2 99 %.  PHYSICAL EXAMINATION:  GENERAL:  62 y.o.-year-old patient lying in the bed with no acute distress.  EYES: Pupils equal, round, reactive to light and accommodation. No scleral icterus. Extraocular muscles intact.  HEENT: Head atraumatic, normocephalic. Oropharynx and nasopharynx clear.  NECK:  Supple, no jugular venous distention. No thyroid enlargement, no tenderness.  LUNGS: mod breath sounds bilaterally, no wheezing, positive  rales,rhonchi , no crepitation. No use of accessory muscles of respiration.  CARDIOVASCULAR: S1, S2 normal. No murmurs, rubs, or gallops.  ABDOMEN: Soft, nontender, nondistended. Bowel sounds present. No organomegaly or mass.  EXTREMITIES: No pedal edema, cyanosis, or clubbing.  NEUROLOGIC: Sleepy but arousable and oriented x3 when she is  awake .sensation intact. Gait not checked.  PSYCHIATRIC: The patient is sleepy but arousable  and oriented x 3.  SKIN: No obvious rash, lesion, or ulcer.    LABORATORY PANEL:   CBC Recent Labs  Lab 04/07/18 0352  WBC 6.8  HGB 13.7  HCT 45.2  PLT 218   ------------------------------------------------------------------------------------------------------------------  Chemistries  Recent Labs  Lab 04/05/18 2312  04/07/18 0352  NA 140   < > 142  K 4.1   < > 4.5  CL 103   < > 105  CO2 30   < > 30  GLUCOSE 117*   < > 126*  BUN 18   < > 18  CREATININE 1.46*   < > 1.36*  CALCIUM 8.7*   < > 9.0  AST 16  --   --   ALT 11  --   --   ALKPHOS 44  --   --   BILITOT 0.4  --   --    < > = values in this interval not displayed.   ------------------------------------------------------------------------------------------------------------------  Cardiac Enzymes Recent Labs  Lab 04/06/18 1808  TROPONINI 0.10*   ------------------------------------------------------------------------------------------------------------------  RADIOLOGY:  Dg Chest 2 View  Result Date: 04/05/2018 CLINICAL DATA:  Chest pain EXAM: CHEST - 2 VIEW COMPARISON:  01/17/2018 FINDINGS: Cardiomegaly with vascular congestion and interstitial prominence which could reflect early or mild interstitial edema. No effusions. No acute bony abnormality. IMPRESSION: Cardiomegaly. Worsening interstitial prominence concerning for interstitial edema. Electronically Signed   By: Charlett NoseKevin  Dover M.D.   On: 04/05/2018 23:36   Dg Abdomen 1 View  Result Date: 04/06/2018 CLINICAL DATA:  Mid chest pain and shortness of  breath. Constipation. EXAM: ABDOMEN - 1 VIEW COMPARISON:  None. FINDINGS: The bowel gas pattern is normal. Moderate to large volume retained large bowel stool. Aortoiliac stent graft. No radio-opaque calculi or other significant radiographic abnormality are seen. Surgical clips LEFT inguinal soft tissues seen with prior  vascular access. IMPRESSION: 1. Moderate to large volume retained large bowel stool. Nonobstructive bowel gas pattern. Electronically Signed   By: Awilda Metro M.D.   On: 04/06/2018 01:49   Dg Chest Port 1 View  Result Date: 04/07/2018 CLINICAL DATA:  Initial evaluation for acute cough. EXAM: PORTABLE CHEST 1 VIEW COMPARISON:  Prior radiograph from 04/05/2018 FINDINGS: Cardiomegaly, stable. Mediastinal silhouette within normal limits. Aortic atherosclerosis. Lungs hypoinflated. Continued interval worsening in pulmonary vascular congestion with interstitial prominence, compatible with worsened pulmonary interstitial edema. No pleural effusion. No new consolidative opacity. No pneumothorax. No acute osseous abnormality. IMPRESSION: 1. Continued interval worsening in pulmonary vascular congestion with interstitial prominence, compatible with worsened pulmonary interstitial edema. 2. Stable cardiomegaly. 3. Aortic atherosclerosis. Electronically Signed   By: Rise Mu M.D.   On: 04/07/2018 15:24    EKG:   Orders placed or performed during the hospital encounter of 04/06/18  . EKG 12-Lead  . EKG 12-Lead  . ED EKG  . ED EKG    ASSESSMENT AND PLAN:    This is a 62 year old female admitted for chest pain.  1.  Acute on chronic systolic CHF: Hold Coreg and Entresto for hypotension 2. Chest pain:  Monitor telemetry. Troponin 0 0.14-0.10 seen by Dr. Park Breed, cardiology initially planned for cardiac cath but patient being hypoglycemic is rescheduled for Thursday, cleared by nephrology nephrology is recommending IV fluids but the patient x-ray with worsening of pulmonary edema.  Lasix on hold.  Creatinine at 1.36.  Repeat a.m. labs 3. CAD: continue aspirin and Pletal.  Continue statin high intensity 4. Hypertension: Currently hypotensive hold antihypertensives 5. COPD: continue inhaled corticosteroid and Spriiva. Albuterol as needed 6. Diabetes mellitus type 2: continue basal insulin  adjusted for hospital diet.  Patient takes Lantus 45 units at home , will adjust the dose to 20 units p.o. nightly if she tolerates diet  Patient was kept n.p.o. for cardiac cath and became hypoglycemic cardiac cath canceled, resume diet and monitor Accu-Cheks sliding scale insulin while hospitalized as well 7.  Hyperlipidemia: continue statin therapy 8. OSA: continue CPAP 9. DVT prophylaxis: heparin     All the records are reviewed and case discussed with Care Management/Social Workerr. Management plans discussed with the patient, family and they are in agreement.  CODE STATUS: fc  TOTAL TIME TAKING CARE OF THIS PATIENT: .   POSSIBLE D/C IN 2 DAYS, DEPENDING ON CLINICAL CONDITION.  Note: This dictation was prepared with Dragon dictation along with smaller phrase technology. Any transcriptional errors that result from this process are unintentional.   Ramonita Lab M.D on 04/07/2018 at 3:52 PM  Between 7am to 6pm - Pager - [phone] After 6pm go to [email or web address] - password [email or web address] [facility]  [place] [facility]  Office  [phone]  CC: Primary care physician; Keane Police, MD

## 2018-04-07 NOTE — Progress Notes (Signed)
SUBJECTIVE: Feeling much better today.   Vitals:   04/06/18 1819 04/06/18 1954 04/07/18 0518 04/07/18 0804  BP: 103/69 105/67 120/85 138/83  Pulse: 85 88 97 (!) 102  Resp: 20 18 18  (!) 22  Temp:  98.2 F (36.8 C) 98.2 F (36.8 C) 97.8 F (36.6 C)  TempSrc:  Oral Oral Oral  SpO2: 91% 92% 96% 99%  Weight:   88.1 kg   Height:        Intake/Output Summary (Last 24 hours) at 04/07/2018 0845 Last data filed at 04/07/2018 0800 Gross per 24 hour  Intake 128.24 ml  Output 1450 ml  Net -1321.76 ml    LABS: Basic Metabolic Panel: Recent Labs    04/06/18 1247 04/07/18 0352  NA 139 142  K 4.9 4.5  CL 101 105  CO2 27 30  GLUCOSE 194* 126*  BUN 17 18  CREATININE 1.33* 1.36*  CALCIUM 8.6* 9.0   Liver Function Tests: Recent Labs    04/05/18 2312  AST 16  ALT 11  ALKPHOS 44  BILITOT 0.4  PROT 7.0  ALBUMIN 3.6   No results for input(s): LIPASE, AMYLASE in the last 72 hours. CBC: Recent Labs    04/05/18 2312 04/06/18 1247 04/07/18 0352  WBC 6.5 5.7 6.8  NEUTROABS 4.4  --   --   HGB 12.6 13.4 13.7  HCT 40.6 43.1 45.2  MCV 87.9 87.8 89.0  PLT 196 178 218   Cardiac Enzymes: Recent Labs    04/06/18 0603 04/06/18 1200 04/06/18 1808  TROPONINI 0.14* 0.10* 0.10*   BNP: Invalid input(s): POCBNP D-Dimer: No results for input(s): DDIMER in the last 72 hours. Hemoglobin A1C: Recent Labs    04/06/18 0603  HGBA1C 8.2*   Fasting Lipid Panel: No results for input(s): CHOL, HDL, LDLCALC, TRIG, CHOLHDL, LDLDIRECT in the last 72 hours. Thyroid Function Tests: Recent Labs    04/06/18 0603  TSH 2.153   Anemia Panel: No results for input(s): VITAMINB12, FOLATE, FERRITIN, TIBC, IRON, RETICCTPCT in the last 72 hours.   PHYSICAL EXAM General: Well developed, well nourished, in no acute distress HEENT:  Normocephalic and atramatic Neck:  No JVD.  Lungs: Clear bilaterally to auscultation and percussion. Heart: HRRR . Normal S1 and S2 without gallops or murmurs.   Abdomen: Bowel sounds are positive, abdomen soft and non-tender  Msk:  Back normal, normal gait. Normal strength and tone for age. Extremities: No clubbing, cyanosis or edema.   Neuro: Alert and oriented X 3. Psych:  Good affect, responds appropriately  TELEMETRY: NSR 95bpm  ASSESSMENT AND PLAN:  Blood sugar  Active Problems:   Acute on chronic systolic CHF (congestive heart failure) (HCC)    Alexis HammanKristin Phoenix Dresser, NP-C 04/07/2018 8:45 AM Cell: [phone]

## 2018-04-07 NOTE — Care Management (Signed)
Barrier to discharge- Cath cancelled 12/3 due to high blood sugar. Now requiring IVF for renal protection prior to cath which is now scheduled for 12/5

## 2018-04-08 ENCOUNTER — Encounter: Payer: Self-pay | Admitting: Cardiovascular Disease

## 2018-04-08 ENCOUNTER — Inpatient Hospital Stay: Payer: Medicare HMO

## 2018-04-08 ENCOUNTER — Encounter
Admission: EM | Disposition: A | Discharge status: Home Health | Payer: Self-pay | Source: Home / Self Care | Attending: Internal Medicine

## 2018-04-08 HISTORY — PX: LEFT HEART CATH AND CORONARY ANGIOGRAPHY: CATH118249

## 2018-04-08 LAB — GLUCOSE, CAPILLARY
Glucose-Capillary: 123 mg/dL — ABNORMAL HIGH (ref 70–99)
Glucose-Capillary: 95 mg/dL (ref 70–99)
Glucose-Capillary: 92 mg/dL (ref 70–99)
Glucose-Capillary: 137 mg/dL — ABNORMAL HIGH (ref 70–99)
Glucose-Capillary: 112 mg/dL — ABNORMAL HIGH (ref 70–99)

## 2018-04-08 LAB — ECHOCARDIOGRAM COMPLETE: Weight: 3107.2 oz

## 2018-04-08 SURGERY — LEFT HEART CATH AND CORONARY ANGIOGRAPHY
Anesthesia: Moderate Sedation

## 2018-04-08 MED ORDER — SPIRONOLACTONE 25 MG PO TABS
12.5000 mg | ORAL_TABLET | Freq: Every day | ORAL | Status: DC
  Administered 2018-04-08 – 2018-04-09 (×2): 12.5 mg via ORAL
  Filled 2018-04-08 (×2): qty 1
  Filled 2018-04-08 (×2): qty 0.5

## 2018-04-08 MED ORDER — FUROSEMIDE 10 MG/ML IJ SOLN
INTRAMUSCULAR | Status: AC
  Filled 2018-04-08: qty 4

## 2018-04-08 MED ORDER — FUROSEMIDE 10 MG/ML IJ SOLN
40.0000 mg | Freq: Once | INTRAMUSCULAR | Status: AC
  Administered 2018-04-08: 40 mg via INTRAVENOUS

## 2018-04-08 MED ORDER — SACUBITRIL-VALSARTAN 24-26 MG PO TABS
1.0000 | ORAL_TABLET | Freq: Two times a day (BID) | ORAL | Status: DC
  Administered 2018-04-08 – 2018-04-09 (×3): 1 via ORAL
  Filled 2018-04-08 (×3): qty 1

## 2018-04-08 MED ORDER — CARVEDILOL 3.125 MG PO TABS
3.1250 mg | ORAL_TABLET | Freq: Two times a day (BID) | ORAL | Status: DC
  Administered 2018-04-08 – 2018-04-09 (×2): 3.125 mg via ORAL
  Filled 2018-04-08 (×2): qty 1

## 2018-04-08 MED ORDER — SODIUM CHLORIDE 0.9 % IV SOLN: 250.0000 mL | INTRAVENOUS | Status: DC | PRN

## 2018-04-08 MED ORDER — HEPARIN (PORCINE) IN NACL 1000-0.9 UT/500ML-% IV SOLN
INTRAVENOUS | Status: AC
  Filled 2018-04-08: qty 1000

## 2018-04-08 MED ORDER — SODIUM CHLORIDE 0.9% FLUSH: 3.0000 mL | INTRAVENOUS | Status: DC | PRN

## 2018-04-08 MED ORDER — ACETAMINOPHEN 325 MG PO TABS: 650.0000 mg | ORAL_TABLET | ORAL | Status: DC | PRN

## 2018-04-08 MED ORDER — MIDAZOLAM HCL 2 MG/2ML IJ SOLN
INTRAMUSCULAR | Status: AC
  Filled 2018-04-08: qty 2

## 2018-04-08 MED ORDER — ASPIRIN EC 81 MG PO TBEC
DELAYED_RELEASE_TABLET | ORAL | Status: AC
  Filled 2018-04-08: qty 1

## 2018-04-08 MED ORDER — IPRATROPIUM-ALBUTEROL 0.5-2.5 (3) MG/3ML IN SOLN
3.0000 mL | Freq: Once | RESPIRATORY_TRACT | Status: AC
  Administered 2018-04-08: 3 mL via RESPIRATORY_TRACT

## 2018-04-08 MED ORDER — SODIUM CHLORIDE 0.9 % IV SOLN
INTRAVENOUS | Status: DC
  Administered 2018-04-08: 09:00:00 via INTRAVENOUS

## 2018-04-08 MED ORDER — MIDAZOLAM HCL 2 MG/2ML IJ SOLN
INTRAMUSCULAR | Status: DC | PRN
  Administered 2018-04-08 (×2): 1 mg via INTRAVENOUS

## 2018-04-08 MED ORDER — IPRATROPIUM-ALBUTEROL 0.5-2.5 (3) MG/3ML IN SOLN
RESPIRATORY_TRACT | Status: AC
  Filled 2018-04-08: qty 3

## 2018-04-08 MED ORDER — IOPAMIDOL (ISOVUE-300) INJECTION 61%
INTRAVENOUS | Status: DC | PRN
  Administered 2018-04-08: 65 mL via INTRAVENOUS

## 2018-04-08 MED ORDER — SODIUM CHLORIDE 0.9 % WEIGHT BASED INFUSION: 1.0000 mL/kg/h | INTRAVENOUS | Status: AC

## 2018-04-08 MED ORDER — FENTANYL CITRATE (PF) 100 MCG/2ML IJ SOLN
INTRAMUSCULAR | Status: DC | PRN
  Administered 2018-04-08 (×2): 25 ug via INTRAVENOUS

## 2018-04-08 MED ORDER — SODIUM CHLORIDE 0.9% FLUSH
3.0000 mL | Freq: Two times a day (BID) | INTRAVENOUS | Status: DC
  Administered 2018-04-08: 3 mL via INTRAVENOUS

## 2018-04-08 MED ORDER — ONDANSETRON HCL 4 MG/2ML IJ SOLN: 4.0000 mg | Freq: Four times a day (QID) | INTRAMUSCULAR | Status: DC | PRN

## 2018-04-08 MED ORDER — FENTANYL CITRATE (PF) 100 MCG/2ML IJ SOLN
INTRAMUSCULAR | Status: AC
  Filled 2018-04-08: qty 2

## 2018-04-08 SURGICAL SUPPLY — 13 items
CATH INFINITI 5FR ANG PIGTAIL (CATHETERS) ×2 IMPLANT
CATH INFINITI 5FR JL4 (CATHETERS) ×2 IMPLANT
CATH INFINITI JR4 5F (CATHETERS) ×2 IMPLANT
DEVICE CLOSURE MYNXGRIP 5F (Vascular Products) ×2 IMPLANT
DEVICE SAFEGUARD 24CM (GAUZE/BANDAGES/DRESSINGS) ×2 IMPLANT
GLIDESHEATH SLEND SS 6F .021 (SHEATH) ×2 IMPLANT
KIT MANI 3VAL PERCEP (MISCELLANEOUS) ×3 IMPLANT
NDL PERC 18GX7CM (NEEDLE) IMPLANT
NEEDLE PERC 18GX7CM (NEEDLE) ×3 IMPLANT
PACK CARDIAC CATH (CUSTOM PROCEDURE TRAY) ×3 IMPLANT
SHEATH AVANTI 5FR X 11CM (SHEATH) ×2 IMPLANT
WIRE GUIDERIGHT .035X150 (WIRE) ×2 IMPLANT
WIRE ROSEN-J .035X260CM (WIRE) ×2 IMPLANT

## 2018-04-08 NOTE — Progress Notes (Signed)
Telemetry notified nurse, patient had a 17 beat run of PSVT.  Patient is asymptomatic. Will continue current plan of care.

## 2018-04-08 NOTE — Progress Notes (Signed)
Patient complaining of SOB and wheezing, O2 saturation in the 80's.  Nonrebreather placed on the patient and physician notified.  Started BiPAP, administered 40mg  of lasix, CHX ordered and IVF discontinued. Will continue current plan of care for patient.

## 2018-04-08 NOTE — Progress Notes (Signed)
Pt noted with severe SOB and wheezing. O2Sat in low 80s.  -D/C ivf -Will increase O2 to maintain O2 Sat at 92%. -STAT cxr -STAT lasix iv. 40 mg -Start BIPAP -Cont to monitor clinically closely

## 2018-04-08 NOTE — Progress Notes (Signed)
Pt. With post. Inspiratory and expiratory wheezes. MD paged. New orders received.Pt. Received duoneb resp. Treatment now[facility] 40 mg IV slow push given. Dr. Welton FlakesKhan at bedside to assess pt. After treatment given.  also Dr. Juliann Paresallwood by to speak with pt. New #22 G IV placed to Left forearm secondary to pt. C/o "itching" and redness to right forearm after Lasix given. Stable for tx to cath lab.

## 2018-04-08 NOTE — Progress Notes (Signed)
[facility] - [place] at [facility]   PATIENT NAME: Alexis Ray    MR#:  [id number]  DATE OF BIRTH:  May 09, 1955  SUBJECTIVE:   Patient was short of breath last night. Cardiac catheterization shows ejection fraction of 10%. She denies shortness of breath or chest pain currently  REVIEW OF SYSTEMS:    Review of Systems  Constitutional: Negative for fever, chills weight loss HENT: Negative for ear pain, nosebleeds, congestion, facial swelling, rhinorrhea, neck pain, neck stiffness and ear discharge.   Respiratory: Negative for cough, shortness of breath, wheezing  Cardiovascular: Negative for chest pain, palpitations and leg swelling.  Gastrointestinal: Negative for heartburn, abdominal pain, vomiting, diarrhea or consitpation Genitourinary: Negative for dysuria, urgency, frequency, hematuria Musculoskeletal: Negative for back pain or joint pain Neurological: Negative for dizziness, seizures, syncope, focal weakness,  numbness and headaches.  Hematological: Does not bruise/bleed easily.  Psychiatric/Behavioral: Negative for hallucinations, confusion, dysphoric mood    Tolerating Diet: yes      DRUG ALLERGIES:   Allergies  Allergen Reactions  . Colchicine     itching  . Sulfa Antibiotics Rash    itching  . Levaquin [Levofloxacin In D5w] Rash    VITALS:  Blood pressure 113/82, pulse 86, temperature 98.2 F (36.8 C), temperature source Oral, resp. rate 16, height 5\' 5"  (1.651 m), weight 86.9 kg, SpO2 98 %.  PHYSICAL EXAMINATION:  Constitutional: Appears well-developed and well-nourished. No distress. HENT: Normocephalic. Marland Kitchen Oropharynx is clear and moist.  Eyes: Conjunctivae and EOM are normal. PERRLA, no scleral icterus.  Neck: Normal ROM. Neck supple. No JVD. No tracheal deviation. CVS: RRR, S1/S2 +, no murmurs, no gallops, no carotid bruit.  Pulmonary: Effort and breath sounds normal, no stridor, rhonchi, wheezes, rales.  Abdominal: Soft. BS +,  no  distension, tenderness, rebound or guarding.  Musculoskeletal: Normal range of motion. No edema and no tenderness.  Neuro: Alert. CN 2-12 grossly intact. No focal deficits. Skin: Skin is warm and dry. No rash noted. Psychiatric: Normal mood and affect.      LABORATORY PANEL:   CBC Recent Labs  Lab 04/07/18 0352  WBC 6.8  HGB 13.7  HCT 45.2  PLT 218   ------------------------------------------------------------------------------------------------------------------  Chemistries  Recent Labs  Lab 04/05/18 2312  04/07/18 0352  NA 140   < > 142  K 4.1   < > 4.5  CL 103   < > 105  CO2 30   < > 30  GLUCOSE 117*   < > 126*  BUN 18   < > 18  CREATININE 1.46*   < > 1.36*  CALCIUM 8.7*   < > 9.0  AST 16  --   --   ALT 11  --   --   ALKPHOS 44  --   --   BILITOT 0.4  --   --    < > = values in this interval not displayed.   ------------------------------------------------------------------------------------------------------------------  Cardiac Enzymes Recent Labs  Lab 04/06/18 0603 04/06/18 1200 04/06/18 1808  TROPONINI 0.14* 0.10* 0.10*   ------------------------------------------------------------------------------------------------------------------  RADIOLOGY:  Dg Chest Port 1 View  Result Date: 04/08/2018 CLINICAL DATA:  Shortness of breath EXAM: PORTABLE CHEST 1 VIEW COMPARISON:  04/07/2018 FINDINGS: Cardiomegaly with vascular congestion and interstitial prominence, likely interstitial edema. Bibasilar atelectasis. No visible significant effusions or acute bony abnormality. IMPRESSION: Cardiomegaly.  Mild interstitial edema.  Bibasilar atelectasis. Electronically Signed   By: Charlett Nose M.D.   On: 04/08/2018 00:54   Dg Chest [facility]  1 View  Result Date: 04/07/2018 CLINICAL DATA:  Initial evaluation for acute cough. EXAM: PORTABLE CHEST 1 VIEW COMPARISON:  Prior radiograph from 04/05/2018 FINDINGS: Cardiomegaly, stable. Mediastinal silhouette within normal  limits. Aortic atherosclerosis. Lungs hypoinflated. Continued interval worsening in pulmonary vascular congestion with interstitial prominence, compatible with worsened pulmonary interstitial edema. No pleural effusion. No new consolidative opacity. No pneumothorax. No acute osseous abnormality. IMPRESSION: 1. Continued interval worsening in pulmonary vascular congestion with interstitial prominence, compatible with worsened pulmonary interstitial edema. 2. Stable cardiomegaly. 3. Aortic atherosclerosis. Electronically Signed   By: Rise MuBenjamin  McClintock M.D.   On: 04/07/2018 15:24     ASSESSMENT AND PLAN:   62 year old female with history of CAD, CKD stage 3 and chronic hypoxic respiratory failure due to COPD on 3 to 4 L of oxygen who presented due to shortness of breath and chest pain.   1.  Acute on chronic systolic heart failure with ejection fraction 10-15% with four-chamber dilation: Continue Aldactone, Entresto and Coreg. CHF clinic upon discharge Monitor intake and output Daily weight  2.  Elevated troponin: This is due to demand ischemia.  Patient is status post cardiac catheterization which is nonobstructive mild CAD and proximal RCA.  3.  Chronic kidney disease stage III: I will order BMP  4.  Diabetes: Continue with current regimen and ADA diet  5.  Chronic hypoxic respiratory failure due to COPD: Continue inhalers and oxygen  6.  OSA: BiPAP at night Management plans discussed with the patient and she is in agreement.  CODE STATUS: full  TOTAL TIME TAKING CARE OF THIS PATIENT: 27 minutes.     POSSIBLE D/C 1-2 days, DEPENDING ON CLINICAL CONDITION.   Fadel Clason M.D on 04/08/2018 at 11:50 AM  Between 7am to 6pm - Pager - [phone] After 6pm go to [email or web address] - password [email or web address]  [facility]  Office  [phone]  CC: Primary care physician; Keane PoliceNoorani, Sezmin S, MD  Note: This dictation was prepared with Dragon dictation along with smaller phrase  technology. Any transcriptional errors that result from this process are unintentional.

## 2018-04-08 NOTE — Progress Notes (Signed)
Spoke to Dr. Juliene PinaMody. Instructed not to start routine post-cath fluids on patient. Her EF is 10-15% and she had an episode of respiratory distress last night requiring IV lasix and bipap use. Patient not yet back from cardiac cath. Will monitor when she returns.

## 2018-04-08 NOTE — Progress Notes (Signed)
SUBJECTIVE: Patient is extremely short of breath   Vitals:   04/08/18 0726 04/08/18 0727 04/08/18 0810 04/08/18 0858  BP: 119/82  121/76   Pulse: 98 99 98   Resp: 19  18   Temp: 98.4 F (36.9 C)  98.2 F (36.8 C)   TempSrc: Oral  Oral   SpO2: (!) 89% 92% 93% 95%  Weight:   86.9 kg   Height:   5\' 5"  (1.651 m)     Intake/Output Summary (Last 24 hours) at 04/08/2018 0932 Last data filed at 04/08/2018 0531 Gross per 24 hour  Intake 306.46 ml  Output 1800 ml  Net -1493.54 ml    LABS: Basic Metabolic Panel: Recent Labs    04/06/18 1247 04/07/18 0352  NA 139 142  K 4.9 4.5  CL 101 105  CO2 27 30  GLUCOSE 194* 126*  BUN 17 18  CREATININE 1.33* 1.36*  CALCIUM 8.6* 9.0   Liver Function Tests: Recent Labs    04/05/18 2312  AST 16  ALT 11  ALKPHOS 44  BILITOT 0.4  PROT 7.0  ALBUMIN 3.6   No results for input(s): LIPASE, AMYLASE in the last 72 hours. CBC: Recent Labs    04/05/18 2312 04/06/18 1247 04/07/18 0352  WBC 6.5 5.7 6.8  NEUTROABS 4.4  --   --   HGB 12.6 13.4 13.7  HCT 40.6 43.1 45.2  MCV 87.9 87.8 89.0  PLT 196 178 218   Cardiac Enzymes: Recent Labs    04/06/18 0603 04/06/18 1200 04/06/18 1808  TROPONINI 0.14* 0.10* 0.10*   BNP: Invalid input(s): POCBNP D-Dimer: No results for input(s): DDIMER in the last 72 hours. Hemoglobin A1C: Recent Labs    04/06/18 0603  HGBA1C 8.2*   Fasting Lipid Panel: No results for input(s): CHOL, HDL, LDLCALC, TRIG, CHOLHDL, LDLDIRECT in the last 72 hours. Thyroid Function Tests: Recent Labs    04/06/18 0603  TSH 2.153   Anemia Panel: No results for input(s): VITAMINB12, FOLATE, FERRITIN, TIBC, IRON, RETICCTPCT in the last 72 hours.   PHYSICAL EXAM General: Well developed, well nourished, in no acute distress HEENT:  Normocephalic and atramatic Neck:  No JVD.  Lungs: Clear bilaterally to auscultation and percussion. Heart: HRRR . Normal S1 and S2 without gallops or murmurs.  Abdomen: Bowel  sounds are positive, abdomen soft and non-tender  Msk:  Back normal, normal gait. Normal strength and tone for age. Extremities: No clubbing, cyanosis or edema.   Neuro: Alert and oriented X 3. Psych:  Good affect, responds appropriately  TELEMETRY: Sinus rhythm about 94 bpm  ASSESSMENT AND PLAN: Congestive heart failure with nonobstructive mild CAD and proximal RCA.  Severe LV dysfunction with left ventricular ejection fraction 10 to 15% with four-chamber dilatation mild mitral dilatation mild tricuspid regurgitation and mild pulmonic regurgitation.  Patient also has renal insufficiency.  Most likely renal insufficiency is due to decreased perfusion of the kidneys and will start the patient on Aldactone 12.5, Entresto 25 p.o. twice daily and carvedilol 3.125 twice daily.  If her blood pressure is compromised may hold for a systolic blood pressure less than 100.  Active Problems:   Acute on chronic systolic CHF (congestive heart failure) (HCC)    KHAN,SHAUKAT A, MD, [facility] 04/08/2018 9:32 AM

## 2018-04-08 NOTE — Progress Notes (Signed)
[facility]  ROUNDING NOTE   Subjective:  Patient completed cardiac catheterization. Became quite short of breath with minimal IV fluids yesterday. She has a very low ejection fraction of 10%.   Objective:  Vital signs in last 24 hours:  Temp:  [97.4 F (36.3 C)-98.4 F (36.9 C)] 98.2 F (36.8 C) (12/05 0810) Pulse Rate:  [86-120] 86 (12/05 1118) Resp:  [16-24] 16 (12/05 1030) BP: (99-134)/(61-82) 113/82 (12/05 1118) SpO2:  [89 %-100 %] 98 % (12/05 1030) FiO2 (%):  [32 %] 32 % (12/04 2054) Weight:  [86.6 kg-86.9 kg] 86.9 kg (12/05 0810)  Weight change: -1.452 kg Filed Weights   04/07/18 0518 04/08/18 0320 04/08/18 0810  Weight: 88.1 kg 86.6 kg 86.9 kg    Intake/Output: I/O last 3 completed shifts: In: 426.5 [P.O.:360; I.V.:66.5] Out: 3250 [Urine:3250]   Intake/Output this shift:  Total I/O In: -  Out: 550 [Urine:550]  Physical Exam: General: No acute distress  Head: Normocephalic, atraumatic. Moist oral mucosal membranes  Eyes: Anicteric  Neck: Supple, trachea midline  Lungs:  Basilar rales, normal effort  Heart: S1S2 no rubs  Abdomen:  Soft, nontender, bowel sounds present  Extremities: trace peripheral edema.  Neurologic: Awake, alert, following commands  Skin: No lesions       Basic Metabolic Panel: Recent Labs  Lab 04/05/18 2312 04/06/18 1247 04/07/18 0352  NA 140 139 142  K 4.1 4.9 4.5  CL 103 101 105  CO2 30 27 30   GLUCOSE 117* 194* 126*  BUN 18 17 18   CREATININE 1.46* 1.33* 1.36*  CALCIUM 8.7* 8.6* 9.0    Liver Function Tests: Recent Labs  Lab 04/05/18 2312  AST 16  ALT 11  ALKPHOS 44  BILITOT 0.4  PROT 7.0  ALBUMIN 3.6   No results for input(s): LIPASE, AMYLASE in the last 168 hours. No results for input(s): AMMONIA in the last 168 hours.  CBC: Recent Labs  Lab 04/05/18 2312 04/06/18 1247 04/07/18 0352  WBC 6.5 5.7 6.8  NEUTROABS 4.4  --   --   HGB 12.6 13.4 13.7  HCT 40.6 43.1 45.2  MCV 87.9 87.8 89.0   PLT 196 178 218    Cardiac Enzymes: Recent Labs  Lab 04/05/18 2312 04/06/18 0603 04/06/18 1200 04/06/18 1808  TROPONINI 0.11* 0.14* 0.10* 0.10*    BNP: Invalid input(s): POCBNP  CBG: Recent Labs  Lab 04/07/18 1647 04/07/18 2043 04/08/18 0728 04/08/18 0955 04/08/18 1126  GLUCAP 188* 183* 123* 95 92    Microbiology: Results for orders placed or performed during the hospital encounter of 06/05/16  MRSA PCR Screening     Status: None   Collection Time: 06/06/16  3:25 PM  Result Value Ref Range Status   MRSA by PCR NEGATIVE NEGATIVE Final    Comment:        The GeneXpert MRSA Assay (FDA approved for NASAL specimens only), is one component of a comprehensive MRSA colonization surveillance program. It is not intended to diagnose MRSA infection nor to guide or monitor treatment for MRSA infections.     Coagulation Studies: Recent Labs    04/06/18 1247  LABPROT 12.3  INR 0.92    Urinalysis: Recent Labs    04/06/18 0140  COLORURINE YELLOW*  LABSPEC 1.014  PHURINE 5.0  GLUCOSEU NEGATIVE  HGBUR NEGATIVE  BILIRUBINUR NEGATIVE  KETONESUR NEGATIVE  PROTEINUR NEGATIVE  NITRITE NEGATIVE  LEUKOCYTESUR NEGATIVE      Imaging: Dg Chest Port 1 View  Result Date: 04/08/2018 CLINICAL DATA:  Shortness of breath EXAM: PORTABLE CHEST 1 VIEW COMPARISON:  04/07/2018 FINDINGS: Cardiomegaly with vascular congestion and interstitial prominence, likely interstitial edema. Bibasilar atelectasis. No visible significant effusions or acute bony abnormality. IMPRESSION: Cardiomegaly.  Mild interstitial edema.  Bibasilar atelectasis. Electronically Signed   By: Charlett NoseKevin  Dover M.D.   On: 04/08/2018 00:54   Dg Chest Port 1 View  Result Date: 04/07/2018 CLINICAL DATA:  Initial evaluation for acute cough. EXAM: PORTABLE CHEST 1 VIEW COMPARISON:  Prior radiograph from 04/05/2018 FINDINGS: Cardiomegaly, stable. Mediastinal silhouette within normal limits. Aortic atherosclerosis.  Lungs hypoinflated. Continued interval worsening in pulmonary vascular congestion with interstitial prominence, compatible with worsened pulmonary interstitial edema. No pleural effusion. No new consolidative opacity. No pneumothorax. No acute osseous abnormality. IMPRESSION: 1. Continued interval worsening in pulmonary vascular congestion with interstitial prominence, compatible with worsened pulmonary interstitial edema. 2. Stable cardiomegaly. 3. Aortic atherosclerosis. Electronically Signed   By: Rise MuBenjamin  McClintock M.D.   On: 04/07/2018 15:24     Medications:   . sodium chloride 10 mL/hr at 04/08/18 0840  . sodium chloride    . sodium chloride     . aspirin EC      . aspirin EC  81 mg Oral Daily  . carvedilol  3.125 mg Oral BID WC  . cilostazol  100 mg Oral BID  . docusate sodium  100 mg Oral BID  . feeding supplement (GLUCERNA SHAKE)  237 mL Oral TID BM  . fluticasone furoate-vilanterol  1 puff Inhalation Daily  . furosemide      . furosemide      . heparin  5,000 Units Subcutaneous Q8H  . insulin aspart  0-15 Units Subcutaneous TID WC  . insulin glargine  20 Units Subcutaneous QHS  . ipratropium-albuterol      . multivitamin with minerals  1 tablet Oral Daily  . polyethylene glycol  17 g Oral Daily  . pramipexole  0.125 mg Oral QHS  . pregabalin  75 mg Oral TID  . rosuvastatin  40 mg Oral Daily  . sacubitril-valsartan  1 tablet Oral BID  . sodium chloride flush  3 mL Intravenous Q12H  . sodium chloride flush  3 mL Intravenous Q12H  . spironolactone  12.5 mg Oral Daily  . tiotropium  18 mcg Inhalation Daily  . venlafaxine XR  150 mg Oral Q breakfast   sodium chloride, acetaminophen, albuterol, HYDROcodone-acetaminophen, nitroGLYCERIN, ondansetron (ZOFRAN) IV, sodium chloride flush, zolpidem  Assessment/ Plan:  62 y.o. female with a PMHx of coronary artery disease, COPD, diabetes mellitus type 2, hypertension, chronic kidney disease stage III baseline creatinine 1.3, who  was admitted to [facility] on 04[facility]06/2018 for evaluation of shortness of breath and chest pain.   1.  Chronic kidney disease stage III, baseline Cr 1.3 2.  Hypertension. 3.  Chest pain with hx of CAD/CHF w rEF 10% 4.  Diabetes mellitus type 2 with CKD.   Plan: Patient did not tolerate IV fluids very well at all secondary to severe heart failure.  Cardiac catheterization complete.  No significant stenoses noted per Dr. Welton FlakesKhan.  Patient started on Entresto 1 tablet p.o. twice daily.  Patient also on spironolactone 12.5 mg daily.  She will need to continue to [facility] as an outpatient given her underlying chronic kidney disease.   LOS: 2 Jeanean Hollett 12/5/201911:42 AM

## 2018-04-09 LAB — GLUCOSE, CAPILLARY
Glucose-Capillary: 135 mg/dL — ABNORMAL HIGH (ref 70–99)
Glucose-Capillary: 239 mg/dL — ABNORMAL HIGH (ref 70–99)

## 2018-04-09 LAB — BASIC METABOLIC PANEL
Anion gap: 8 (ref 5–15)
BUN: 21 mg/dL (ref 8–23)
CALCIUM: 8.9 mg/dL (ref 8.9–10.3)
CO2: 28 mmol/L (ref 22–32)
Chloride: 105 mmol/L (ref 98–111)
Creatinine, Ser: 1.34 mg/dL — ABNORMAL HIGH (ref 0.44–1.00)
GFR calc Af Amer: 49 mL/min — ABNORMAL LOW (ref >60)
GFR calc non Af Amer: 43 mL/min — ABNORMAL LOW (ref >60)
Glucose, Bld: 132 mg/dL — ABNORMAL HIGH (ref 70–99)
Potassium: 4.2 mmol/L (ref 3.5–5.1)
Sodium: 141 mmol/L (ref 135–145)

## 2018-04-09 MED ORDER — FUROSEMIDE 40 MG PO TABS: 40.0000 mg | ORAL_TABLET | Freq: Every day | ORAL | 0 refills | Status: AC

## 2018-04-09 NOTE — Progress Notes (Signed)
Discharge instructions explained to pt/ verbalized an understanding/ iv and tele removed/ RX given to pt/ Life Vest to be applied to pt by [employer] at 2pm before discharge/ will transport off unit via wheelchair when ride arrives.

## 2018-04-09 NOTE — Progress Notes (Signed)
Pt refused Bipap tonight, Pt on 3L  sats are well, in no respiratory distress

## 2018-04-09 NOTE — Progress Notes (Signed)
SUBJECTIVE: Feeling well. No excessive right groin tenderness, on baseline oxygen requirements.  Vitals:   04/08/18 1936 04/08/18 2023 04/09/18 0503 04/09/18 0752  BP: (!) 89/58 105/68 116/68 130/76  Pulse: 88 86 91 95  Resp: 18  18 19   Temp: 98.8 F (37.1 C)  98.7 F (37.1 C) 98.2 F (36.8 C)  TempSrc: Oral   Oral  SpO2: 100%  98% 97%  Weight:   86.7 kg   Height:        Intake/Output Summary (Last 24 hours) at 04/09/2018 0849 Last data filed at 04/08/2018 1126 Gross per 24 hour  Intake -  Output 550 ml  Net -550 ml    LABS: Basic Metabolic Panel: Recent Labs    04/07/18 0352 04/09/18 0315  NA 142 141  K 4.5 4.2  CL 105 105  CO2 30 28  GLUCOSE 126* 132*  BUN 18 21  CREATININE 1.36* 1.34*  CALCIUM 9.0 8.9   Liver Function Tests: No results for input(s): AST, ALT, ALKPHOS, BILITOT, PROT, ALBUMIN in the last 72 hours. No results for input(s): LIPASE, AMYLASE in the last 72 hours. CBC: Recent Labs    04/06/18 1247 04/07/18 0352  WBC 5.7 6.8  HGB 13.4 13.7  HCT 43.1 45.2  MCV 87.8 89.0  PLT 178 218   Cardiac Enzymes: Recent Labs    04/06/18 1200 04/06/18 1808  TROPONINI 0.10* 0.10*   BNP: Invalid input(s): POCBNP D-Dimer: No results for input(s): DDIMER in the last 72 hours. Hemoglobin A1C: No results for input(s): HGBA1C in the last 72 hours. Fasting Lipid Panel: No results for input(s): CHOL, HDL, LDLCALC, TRIG, CHOLHDL, LDLDIRECT in the last 72 hours. Thyroid Function Tests: No results for input(s): TSH, T4TOTAL, T3FREE, THYROIDAB in the last 72 hours.  Invalid input(s): FREET3 Anemia Panel: No results for input(s): VITAMINB12, FOLATE, FERRITIN, TIBC, IRON, RETICCTPCT in the last 72 hours.   PHYSICAL EXAM General: Well developed, well nourished, mild shortness of breath, at baseline. HEENT:  Normocephalic and atramatic Neck:  No JVD.  Lungs: Clear bilaterally to auscultation and percussion. Heart: HRRR . Normal S1 and S2 without gallops or  murmurs.  Abdomen: Bowel sounds are positive, abdomen soft and non-tender  Msk:  Back normal, normal gait. Normal strength and tone for age. Extremities: No clubbing, cyanosis or edema.   Neuro: Alert and oriented X 3. Psych:  Good affect, responds appropriately  TELEMETRY: NSR 86bpm  ASSESSMENT AND PLAN:  Acute on chronic Congestive heart failure: S/P coronary cath yesterday with no obstructive coronary artery disease, RCA 30%.  Echo shows severe LV dysfunction at 10-15%. Will order Zoll LifeVest as pt agrees to wear.  Advise discharge home on current doses of Entresto, Coreg, and aldactone. Follow up outpatient with Dr. Welton FlakesKhan [facility], Monday 04/12/18 at 10:30am.   Active Problems:   Acute on chronic systolic CHF (congestive heart failure) (HCC)    Alexis HammanKristin Mylasia Vorhees, NP-C 04/09/2018 8:49 AM Cell: [phone]

## 2018-04-09 NOTE — Progress Notes (Signed)
Cardiovascular and Pulmonary Nurse Navigator Note:    CHF Education:?? Educational session with patient completed.  Provided patient with "Living Better with Heart Failure" packet. Briefly reviewed definition of heart failure and signs and symptoms of an exacerbation.?Explained to patient that HF is a chronic illness which requires self-assessment / self-management along with help from the cardiologist/PCP.?NOTE:  CHF is not a new diagnosis for this patient.  ? ? *Reviewed importance of and reason behind checking weight daily in the AM, after using the bathroom, but before getting dressed. Patient has scales. Patient encouraged to weigh every day and assess her symptoms and follow guidelines according to HF Zones.  Patient has HF Zone Magnet.   ? *Reviewed with patient the following information: *Discussed when to call the Dr= weight gain of >2-3lb overnight of 5lb in a week,  *Discussed yellow zone= call MD: weight gain of >2-3lb overnight of 5lb in a week, increased swelling, increased SOB when lying down, chest discomfort, dizziness, increased fatigue *Red Zone= call 911: struggle to breath, fainting or near fainting, significant chest pain   *Reviewed low sodium diet-provided handout of recommended and not recommended foods.?  *Discussed fluid intake with patient as well. Patient not currently on a fluid restriction, but advised no more than 8-8 ounces glass of fluids per day.? ? *Instructed patient to take medications as prescribed for heart failure. Explained briefly why pt is on the medications (either make you feel better, live longer or keep you out of the hospital) and discussed monitoring and side effects.  ? *Discussed exercise. HH PT ordered for patient.   Encouraged patient to be as active as possible.   ? *Smoking Cessation- Patient is a CURRENT every day smoker.?Patient reports she has quit once before for two weeks.  Informational sheet "Thinking About Quitting Tobacco; Yes, You  Can!" given and reviewed with patient. Patient is not sure she is ready to quit or that she wants to quit. ? *[facility] - Patient is an established patient in the Heart Failure Clinic.  Next appointment is 04/15/2018 at 9:40 a.m.    [facility], OT, Social Work.   Zoll Life Vest is being ordered for patient prior to discharge by patient's cardiologist.    Again, the 5 Steps to Living Better with Heart Failure were reviewed with patient.  ? Patient thanked me for providing the above information. ? ? Army Meliaiane Wright, RN, BSN, [facility]? [facility]  [facility] Cardiac &?Pulmonary Rehab  Cardiovascular &?Pulmonary Nurse Navigator  Direct Line: [phone]  Department Phone #: [phone] Fax: [phone]? Email Address: [email or web address]

## 2018-04-09 NOTE — Care Management Important Message (Signed)
Copy of signed IM left with patient in room.  

## 2018-04-09 NOTE — Care Management Note (Signed)
Case Management Note  Patient Details  Name: Nadara MustardDebra W Ramnauth MRN: [id number] Date of Birth: 05-16-1955  Subjective/Objective:    Notified [facility] with [facility] of patient discharge for home health services to begin.                  Action/Plan:   Expected Discharge Date:  04/09/18               Expected Discharge Plan:  Home w Home Health Services  In-House Referral:     Discharge planning Services  CM Consult  Post Acute Care Choice:  Home Health Choice offered to:  Patient  DME Arranged:    DME Agency:     HH Arranged:  RN, PT, OT, Social Work [employer] Agency:  [facility]  Status of Service:  Completed, signed off  If discussed at [employer] Meetings, dates discussed:    Additional Comments:  Sherren KernsJennifer L Madisun Hargrove, RN 04/09/2018, 9:45 AM

## 2018-04-09 NOTE — Progress Notes (Signed)
Life Vest applied

## 2018-04-09 NOTE — Progress Notes (Signed)
[facility]  ROUNDING NOTE   Subjective:  Renal function stable with a creatinine of 1.34. Tolerated cardiac catheterization well yesterday.  Objective:  Vital signs in last 24 hours:  Temp:  [98.2 F (36.8 C)-98.8 F (37.1 C)] 98.2 F (36.8 C) (12/06 0752) Pulse Rate:  [86-95] 95 (12/06 0752) Resp:  [18-19] 19 (12/06 0752) BP: (89-130)/(58-98) 130/76 (12/06 0752) SpO2:  [97 %-100 %] 97 % (12/06 0752) Weight:  [86.7 kg] 86.7 kg (12/06 0503)  Weight change: 0.255 kg Filed Weights   04/08/18 0320 04/08/18 0810 04/09/18 0503  Weight: 86.6 kg 86.9 kg 86.7 kg    Intake/Output: I/O last 3 completed shifts: In: -  Out: 1850 [Urine:1850]   Intake/Output this shift:  No intake/output data recorded.  Physical Exam: General: No acute distress  Head: Normocephalic, atraumatic. Moist oral mucosal membranes  Eyes: Anicteric  Neck: Supple, trachea midline  Lungs:  CTAB, normal effort  Heart: S1S2 no rubs  Abdomen:  Soft, nontender, bowel sounds present  Extremities: trace peripheral edema.  Neurologic: Awake, alert, following commands  Skin: No lesions       Basic Metabolic Panel: Recent Labs  Lab 04/05/18 2312 04/06/18 1247 04/07/18 0352 04/09/18 0315  NA 140 139 142 141  K 4.1 4.9 4.5 4.2  CL 103 101 105 105  CO2 30 27 30 28   GLUCOSE 117* 194* 126* 132*  BUN 18 17 18 21   CREATININE 1.46* 1.33* 1.36* 1.34*  CALCIUM 8.7* 8.6* 9.0 8.9    Liver Function Tests: Recent Labs  Lab 04/05/18 2312  AST 16  ALT 11  ALKPHOS 44  BILITOT 0.4  PROT 7.0  ALBUMIN 3.6   No results for input(s): LIPASE, AMYLASE in the last 168 hours. No results for input(s): AMMONIA in the last 168 hours.  CBC: Recent Labs  Lab 04/05/18 2312 04/06/18 1247 04/07/18 0352  WBC 6.5 5.7 6.8  NEUTROABS 4.4  --   --   HGB 12.6 13.4 13.7  HCT 40.6 43.1 45.2  MCV 87.9 87.8 89.0  PLT 196 178 218    Cardiac Enzymes: Recent Labs  Lab 04/05/18 2312 04/06/18 0603  04/06/18 1200 04/06/18 1808  TROPONINI 0.11* 0.14* 0.10* 0.10*    BNP: Invalid input(s): POCBNP  CBG: Recent Labs  Lab 04/08/18 1126 04/08/18 1715 04/08/18 2116 04/09/18 0753 04/09/18 1138  GLUCAP 92 137* 112* 135* 239*    Microbiology: Results for orders placed or performed during the hospital encounter of 06/05/16  MRSA PCR Screening     Status: None   Collection Time: 06/06/16  3:25 PM  Result Value Ref Range Status   MRSA by PCR NEGATIVE NEGATIVE Final    Comment:        The GeneXpert MRSA Assay (FDA approved for NASAL specimens only), is one component of a comprehensive MRSA colonization surveillance program. It is not intended to diagnose MRSA infection nor to guide or monitor treatment for MRSA infections.     Coagulation Studies: Recent Labs    04/06/18 1247  LABPROT 12.3  INR 0.92    Urinalysis: No results for input(s): COLORURINE, LABSPEC, PHURINE, GLUCOSEU, HGBUR, BILIRUBINUR, KETONESUR, PROTEINUR, UROBILINOGEN, NITRITE, LEUKOCYTESUR in the last 72 hours.  Invalid input(s): APPERANCEUR    Imaging: Dg Chest Port 1 View  Result Date: 04/08/2018 CLINICAL DATA:  Shortness of breath EXAM: PORTABLE CHEST 1 VIEW COMPARISON:  04/07/2018 FINDINGS: Cardiomegaly with vascular congestion and interstitial prominence, likely interstitial edema. Bibasilar atelectasis. No visible significant effusions or acute bony  abnormality. IMPRESSION: Cardiomegaly.  Mild interstitial edema.  Bibasilar atelectasis. Electronically Signed   By: Charlett NoseKevin  Dover M.D.   On: 04/08/2018 00:54   Dg Chest Port 1 View  Result Date: 04/07/2018 CLINICAL DATA:  Initial evaluation for acute cough. EXAM: PORTABLE CHEST 1 VIEW COMPARISON:  Prior radiograph from 04/05/2018 FINDINGS: Cardiomegaly, stable. Mediastinal silhouette within normal limits. Aortic atherosclerosis. Lungs hypoinflated. Continued interval worsening in pulmonary vascular congestion with interstitial prominence, compatible  with worsened pulmonary interstitial edema. No pleural effusion. No new consolidative opacity. No pneumothorax. No acute osseous abnormality. IMPRESSION: 1. Continued interval worsening in pulmonary vascular congestion with interstitial prominence, compatible with worsened pulmonary interstitial edema. 2. Stable cardiomegaly. 3. Aortic atherosclerosis. Electronically Signed   By: Rise MuBenjamin  McClintock M.D.   On: 04/07/2018 15:24     Medications:   . sodium chloride 10 mL/hr at 04/08/18 0840  . sodium chloride     . aspirin EC  81 mg Oral Daily  . carvedilol  3.125 mg Oral BID WC  . cilostazol  100 mg Oral BID  . docusate sodium  100 mg Oral BID  . feeding supplement (GLUCERNA SHAKE)  237 mL Oral TID BM  . fluticasone furoate-vilanterol  1 puff Inhalation Daily  . heparin  5,000 Units Subcutaneous Q8H  . insulin aspart  0-15 Units Subcutaneous TID WC  . insulin glargine  20 Units Subcutaneous QHS  . multivitamin with minerals  1 tablet Oral Daily  . polyethylene glycol  17 g Oral Daily  . pramipexole  0.125 mg Oral QHS  . pregabalin  75 mg Oral TID  . rosuvastatin  40 mg Oral Daily  . sacubitril-valsartan  1 tablet Oral BID  . sodium chloride flush  3 mL Intravenous Q12H  . sodium chloride flush  3 mL Intravenous Q12H  . spironolactone  12.5 mg Oral Daily  . tiotropium  18 mcg Inhalation Daily  . venlafaxine XR  150 mg Oral Q breakfast   sodium chloride, acetaminophen, albuterol, HYDROcodone-acetaminophen, nitroGLYCERIN, ondansetron (ZOFRAN) IV, sodium chloride flush, zolpidem  Assessment/ Plan:  62 y.o. female with a PMHx of coronary artery disease, COPD, diabetes mellitus type 2, hypertension, chronic kidney disease stage III baseline creatinine 1.3, who [facility] for evaluation of shortness of breath and chest pain.   1.  Chronic kidney disease stage III, baseline Cr 1.3 2.  Hypertension. 3.  Chest pain with hx of CAD/CHF w rEF 10% 4.  Diabetes mellitus  type 2 with CKD.   Plan: It appears to have tolerated cardiac catheterization quite well without any worsening renal function thus far.  She will need continued monitoring of her renal function as an outpatient.  Okay to continue Entresto as well as spironolactone at this time given her severe underlying heart failure.  Otherwise disposition as per hospitalist.   LOS: 3 Elajah Kunsman 12/6/201912:02 PM

## 2018-04-09 NOTE — Progress Notes (Signed)
Cardiovascular and Pulmonary Nurse Navigator Note:    Cardiology is ordering a [employer] Life Vest for patient.  The [facility] by Caroleen HammanKristin Cunningham, NP-C at Dr. Milta DeitersKhan's office.  This RN spoke to [employer] Life [employer]est representative, Fredirick Maudlinllen Guidotti, on the phone regarding the order for Life Vest.  As requested, this RN faxed all required supporting documentation for insurance authorization to [employer] Life [employer]est at [phone] (Fax).  Awaiting insurance approval.    Addendum:  Received notification at 12:41 from [employer] Live Vest representative that patient's insurance had approved Vest. Representative to arrive on inpatinet nursing unit at 2 p.m. To fit patient with vest.  Army Meliaiane Joeann Steppe, RN, BSN, [facility]  [place]  [facility] Cardiac & Pulmonary Rehab  Cardiovascular & Pulmonary Nurse Navigator  Direct Line: [phone]  Department Phone #: [phone] Fax: [phone]  Email Address: [email or web address]

## 2018-04-09 NOTE — Discharge Summary (Signed)
[employer] [facility] [facility] [place] [facility]   PATIENT NAME: Alexis Ray    MR#:  [id number]  DATE OF BIRTH:  [birth year]  DATE OF ADMISSION:  04/06/2018 ADMITTING PHYSICIAN: Arnaldo Natal, MD  DATE OF DISCHARGE: 04/09/2018  PRIMARY CARE PHYSICIAN: Keane Police, MD    ADMISSION DIAGNOSIS:  Hypoxia [R09.02] Elevated troponin [R79.89] Chest pain, unspecified type [R07.9] Acute on chronic congestive heart failure, unspecified heart failure type (HCC) [I50.9]  DISCHARGE DIAGNOSIS:  Active Problems:   Acute on chronic systolic CHF (congestive heart failure) (HCC)   SECONDARY DIAGNOSIS:   Past Medical History:  Diagnosis Date  . CAD (coronary artery disease)   . Chronic combined systolic and diastolic CHF (congestive heart failure) (HCC)   . COPD (chronic obstructive pulmonary disease) (HCC)   . Diabetes mellitus without complication (HCC)   . GERD (gastroesophageal reflux disease)   . Hypertension   . PVD (peripheral vascular disease) (HCC)   . Stroke (cerebrum) [facility])     HOSPITAL COURSE:   62 year old female with history of CAD, CKD stage 3 and chronic hypoxic respiratory failure due to COPD on 3 to 4 L of oxygen who presented due to shortness of breath and chest pain.   1.  Acute on chronic systolic heart failure with ejection fraction 10-15% with four-chamber dilation: She will continue Aldactone, Entresto and Coreg. She has been referred toCHF clinic upon discharge   2.  Elevated troponin: This is due to demand ischemia. She is status post cardiac catheterization.  Prox RCA lesion is 30% stenosed.   Mild CAD with proximal RCA about 30% and normal left circumflex and LAD with normal left main.  LV gram was deferred due to renal insufficiency and LVEDP was 21.  Patient's ejection fraction on echocardiogram was noted to be between 10 to 15% with severely dilated left ventricle..   3.  Chronic kidney disease stage III: Creatinine is stable and she  will have outpatient follow up.  4.  Diabetes: Continue with current regimen and ADA diet  5.  Chronic hypoxic respiratory failure due to COPD: Continue inhalers and oxygen  6.  OSA: Continue CPAP at night   DISCHARGE CONDITIONS AND DIET:   Stable and cardiac diabetic diet  CONSULTS OBTAINED:  Treatment Team:  Laurier Nancy, MD Mady Haagensen, MD  DRUG ALLERGIES:   Allergies  Allergen Reactions  . Colchicine     itching  . Sulfa Antibiotics Rash    itching  . Levaquin [Levofloxacin In D5w] Rash    DISCHARGE MEDICATIONS:   Allergies as of 04/09/2018      Reactions   Colchicine    itching   Sulfa Antibiotics Rash   itching   Levaquin [levofloxacin In D5w] Rash      Medication List    STOP taking these medications   predniSONE 20 MG tablet Commonly known as:  DELTASONE     TAKE these medications   VENTOLIN HFA 108 (90 Base) MCG/ACT inhaler Generic drug:  albuterol Inhale 2 puffs into the lungs every 6 (six) hours as needed for wheezing or shortness of breath.   albuterol (2.5 MG/3ML) 0.083% nebulizer solution Commonly known as:  PROVENTIL Take 3 mLs (2.5 mg total) by nebulization every 6 (six) hours as needed for wheezing or shortness of breath.   aspirin EC 81 MG tablet Take 81 mg by mouth daily.   budesonide-formoterol 160-4.5 MCG/ACT inhaler Commonly known as:  SYMBICORT Inhale 2 puffs into the lungs 2 (two) times  daily.   carvedilol 3.125 MG tablet Commonly known as:  COREG Take 1 tablet (3.125 mg total) by mouth 2 (two) times daily with a meal.   cholecalciferol 25 MCG (1000 UT) tablet Commonly known as:  VITAMIN D3 Take 1,000 Units by mouth daily.   cilostazol 100 MG tablet Commonly known as:  PLETAL Take 100 mg by mouth 2 (two) times daily.   Dulaglutide 1.5 MG/0.5ML Sopn Inject 1.5 mg into the skin every Monday.   esomeprazole 40 MG capsule Commonly known as:  NEXIUM Take 40 mg by mouth daily.   ferrous sulfate 325 (65 FE) MG  tablet Take 325 mg by mouth daily with breakfast.   fluticasone 50 MCG/ACT nasal spray Commonly known as:  FLONASE Place 1 spray into both nostrils 2 (two) times daily.   furosemide 40 MG tablet Commonly known as:  LASIX Take 1 tablet (40 mg total) by mouth daily. What changed:  when to take this   insulin aspart 100 UNIT/ML injection Commonly known as:  novoLOG Inject 10 Units into the skin 3 (three) times daily with meals.   insulin glargine 100 UNIT/ML injection Commonly known as:  LANTUS Inject 45 Units into the skin at bedtime.   levocetirizine 5 MG tablet Commonly known as:  XYZAL Take 5 mg by mouth at bedtime.   metFORMIN 500 MG tablet Commonly known as:  GLUCOPHAGE Take 500 mg by mouth 2 (two) times daily with a meal.   nitroGLYCERIN 0.4 MG SL tablet Commonly known as:  NITROSTAT Place 0.4 mg under the tongue every 5 (five) minutes as needed for chest pain.   pramipexole 0.125 MG tablet Commonly known as:  MIRAPEX Take 0.125 mg by mouth at bedtime.   pregabalin 150 MG capsule Commonly known as:  LYRICA Take 150 mg by mouth 3 (three) times daily.   rosuvastatin 40 MG tablet Commonly known as:  CRESTOR Take 40 mg by mouth daily.   sacubitril-valsartan 24-26 MG Commonly known as:  ENTRESTO Take 1 tablet by mouth 2 (two) times daily.   SENTRY SENIOR Tabs Take 1 tablet by mouth daily.   SPIRIVA RESPIMAT 2.5 MCG/ACT Aers Generic drug:  Tiotropium Bromide Monohydrate Inhale 2 puffs into the lungs daily.   spironolactone 25 MG tablet Commonly known as:  ALDACTONE Take 12.5 mg by mouth daily.   Venlafaxine HCl 225 MG Tb24 Take 225 mg by mouth daily.   vitamin C 500 MG tablet Commonly known as:  ASCORBIC ACID Take 500 mg by mouth daily.         Today   CHIEF COMPLAINT:  Doing well no sob or chest pain   VITAL SIGNS:  Blood pressure 130/76, pulse 95, temperature 98.2 F (36.8 C), temperature source Oral, resp. rate 19, height 5\' 5"  (1.651  m), weight 86.7 kg, SpO2 97 %.   REVIEW OF SYSTEMS:  Review of Systems  Constitutional: Negative.  Negative for chills, fever and malaise/fatigue.  HENT: Negative.  Negative for ear discharge, ear pain, hearing loss, nosebleeds and sore throat.   Eyes: Negative.  Negative for blurred vision and pain.  Respiratory: Negative.  Negative for cough, hemoptysis, shortness of breath and wheezing.   Cardiovascular: Negative.  Negative for chest pain, palpitations and leg swelling.  Gastrointestinal: Negative.  Negative for abdominal pain, blood in stool, diarrhea, nausea and vomiting.  Genitourinary: Negative.  Negative for dysuria.  Musculoskeletal: Negative.  Negative for back pain.  Skin: Negative.   Neurological: Negative for dizziness, tremors, speech change, focal  weakness, seizures and headaches.  Endo/Heme/Allergies: Negative.  Does not bruise/bleed easily.  Psychiatric/Behavioral: Negative.  Negative for depression, hallucinations and suicidal ideas.     PHYSICAL EXAMINATION:  GENERAL:  62 y.o.-year-old patient lying in the bed with no acute distress.  NECK:  Supple, no jugular venous distention. No thyroid enlargement, no tenderness.  LUNGS: Normal breath sounds bilaterally, no wheezing, rales,rhonchi  No use of accessory muscles of respiration.  CARDIOVASCULAR: S1, S2 normal. No murmurs, rubs, or gallops.  ABDOMEN: Soft, non-tender, non-distended. Bowel sounds present. No organomegaly or mass.  EXTREMITIES: No pedal edema, cyanosis, or clubbing.  PSYCHIATRIC: The patient is alert and oriented x 3.  SKIN: No obvious rash, lesion, or ulcer.   DATA REVIEW:   CBC Recent Labs  Lab 04/07/18 0352  WBC 6.8  HGB 13.7  HCT 45.2  PLT 218    Chemistries  Recent Labs  Lab 04/05/18 2312  04/09/18 0315  NA 140   < > 141  K 4.1   < > 4.2  CL 103   < > 105  CO2 30   < > 28  GLUCOSE 117*   < > 132*  BUN 18   < > 21  CREATININE 1.46*   < > 1.34*  CALCIUM 8.7*   < > 8.9  AST 16   --   --   ALT 11  --   --   ALKPHOS 44  --   --   BILITOT 0.4  --   --    < > = values in this interval not displayed.    Cardiac Enzymes Recent Labs  Lab 04/06/18 0603 04/06/18 1200 04/06/18 1808  TROPONINI 0.14* 0.10* 0.10*    Microbiology Results  @MICRORSLT48 @  RADIOLOGY:  Dg Chest Port 1 View  Result Date: 04/08/2018 CLINICAL DATA:  Shortness of breath EXAM: PORTABLE CHEST 1 VIEW COMPARISON:  04/07/2018 FINDINGS: Cardiomegaly with vascular congestion and interstitial prominence, likely interstitial edema. Bibasilar atelectasis. No visible significant effusions or acute bony abnormality. IMPRESSION: Cardiomegaly.  Mild interstitial edema.  Bibasilar atelectasis. Electronically Signed   By: Charlett Nose M.D.   On: 04/08/2018 00:54   Dg Chest Port 1 View  Result Date: 04/07/2018 CLINICAL DATA:  Initial evaluation for acute cough. EXAM: PORTABLE CHEST 1 VIEW COMPARISON:  Prior radiograph from 04/05/2018 FINDINGS: Cardiomegaly, stable. Mediastinal silhouette within normal limits. Aortic atherosclerosis. Lungs hypoinflated. Continued interval worsening in pulmonary vascular congestion with interstitial prominence, compatible with worsened pulmonary interstitial edema. No pleural effusion. No new consolidative opacity. No pneumothorax. No acute osseous abnormality. IMPRESSION: 1. Continued interval worsening in pulmonary vascular congestion with interstitial prominence, compatible with worsened pulmonary interstitial edema. 2. Stable cardiomegaly. 3. Aortic atherosclerosis. Electronically Signed   By: Rise Mu M.D.   On: 04/07/2018 15:24      Allergies as of 04/09/2018      Reactions   Colchicine    itching   Sulfa Antibiotics Rash   itching   Levaquin [levofloxacin In D5w] Rash      Medication List    STOP taking these medications   predniSONE 20 MG tablet Commonly known as:  DELTASONE     TAKE these medications   VENTOLIN HFA 108 (90 Base) MCG/ACT  inhaler Generic drug:  albuterol Inhale 2 puffs into the lungs every 6 (six) hours as needed for wheezing or shortness of breath.   albuterol (2.5 MG/3ML) 0.083% nebulizer solution Commonly known as:  PROVENTIL Take 3 mLs (2.5 mg total) by nebulization  every 6 (six) hours as needed for wheezing or shortness of breath.   aspirin EC 81 MG tablet Take 81 mg by mouth daily.   budesonide-formoterol 160-4.5 MCG/ACT inhaler Commonly known as:  SYMBICORT Inhale 2 puffs into the lungs 2 (two) times daily.   carvedilol 3.125 MG tablet Commonly known as:  COREG Take 1 tablet (3.125 mg total) by mouth 2 (two) times daily with a meal.   cholecalciferol 25 MCG (1000 UT) tablet Commonly known as:  VITAMIN D3 Take 1,000 Units by mouth daily.   cilostazol 100 MG tablet Commonly known as:  PLETAL Take 100 mg by mouth 2 (two) times daily.   Dulaglutide 1.5 MG/0.5ML Sopn Inject 1.5 mg into the skin every Monday.   esomeprazole 40 MG capsule Commonly known as:  NEXIUM Take 40 mg by mouth daily.   ferrous sulfate 325 (65 FE) MG tablet Take 325 mg by mouth daily with breakfast.   fluticasone 50 MCG/ACT nasal spray Commonly known as:  FLONASE Place 1 spray into both nostrils 2 (two) times daily.   furosemide 40 MG tablet Commonly known as:  LASIX Take 1 tablet (40 mg total) by mouth daily. What changed:  when to take this   insulin aspart 100 UNIT/ML injection Commonly known as:  novoLOG Inject 10 Units into the skin 3 (three) times daily with meals.   insulin glargine 100 UNIT/ML injection Commonly known as:  LANTUS Inject 45 Units into the skin at bedtime.   levocetirizine 5 MG tablet Commonly known as:  XYZAL Take 5 mg by mouth at bedtime.   metFORMIN 500 MG tablet Commonly known as:  GLUCOPHAGE Take 500 mg by mouth 2 (two) times daily with a meal.   nitroGLYCERIN 0.4 MG SL tablet Commonly known as:  NITROSTAT Place 0.4 mg under the tongue every 5 (five) minutes as needed  for chest pain.   pramipexole 0.125 MG tablet Commonly known as:  MIRAPEX Take 0.125 mg by mouth at bedtime.   pregabalin 150 MG capsule Commonly known as:  LYRICA Take 150 mg by mouth 3 (three) times daily.   rosuvastatin 40 MG tablet Commonly known as:  CRESTOR Take 40 mg by mouth daily.   sacubitril-valsartan 24-26 MG Commonly known as:  ENTRESTO Take 1 tablet by mouth 2 (two) times daily.   SENTRY SENIOR Tabs Take 1 tablet by mouth daily.   SPIRIVA RESPIMAT 2.5 MCG/ACT Aers Generic drug:  Tiotropium Bromide Monohydrate Inhale 2 puffs into the lungs daily.   spironolactone 25 MG tablet Commonly known as:  ALDACTONE Take 12.5 mg by mouth daily.   Venlafaxine HCl 225 MG Tb24 Take 225 mg by mouth daily.   vitamin C 500 MG tablet Commonly known as:  ASCORBIC ACID Take 500 mg by mouth daily.          Management plans discussed with the patient and she is in agreement. Stable for discharge home  Patient should follow up with cardiology  CODE STATUS:     Code Status Orders  (From admission, onward)         Start     Ordered   04/06/18 0525  Full code  Continuous     04/06/18 0524        Code Status History    Date Active Date Inactive Code Status Order ID Comments User Context   06/05/2016 2320 06/09/2016 1759 Full Code [id number]  Tonye RoyaltyHugelmeyer, Alexis, DO ED   05/29/2016 0643 05/30/2016 2000 Full Code [id number]  Joycelyn Ruaiamond, Michael  S, MD ED   05/23/2015 2256 05/26/2015 1947 Full Code [id number]  Oralia Manis, MD Inpatient    Advance Directive Documentation     Most Recent Value  Type of Advance Directive  Healthcare Power of Attorney, Living will  Pre-existing out of facility DNR order (yellow form or pink MOST form)  -  "MOST" Form in Place?  -      TOTAL TIME TAKING CARE OF THIS PATIENT: 39 minutes.    Note: This dictation was prepared with Dragon dictation along with smaller phrase technology. Any transcriptional errors that result from this process are  unintentional.  Joss Friedel M.D on 04/09/2018 at 9:02 AM  Between 7am to 6pm - Pager - [phone] After 6pm go to [email or web address] - Social research officer, government  [facility]  Office  [phone]  CC: Primary care physician; Keane Police, MD

## 2018-04-09 NOTE — Plan of Care (Signed)
Right groin puncture site without bleed or hematoma.

## 2018-04-13 ENCOUNTER — Telehealth: Payer: Self-pay

## 2018-04-13 NOTE — Telephone Encounter (Signed)
Flagged on EMMI report for not having a follow up scheduled.  First attempt to reach patient made, however unable to reach patient.  Left voicemail encouraging callback. Will attempt at later time.    

## 2018-04-14 NOTE — Progress Notes (Signed)
Patient ID: Alexis MustardDebra W Ray, female    DOB: [birth year], 62 y.o.   MRN: [id number]  HPI  Alexis Ray is a 62 y/o female with a history of CAD, DM, HTN, stroke, COPD, GERD, PVD, tobacco use and chronic heart failure.   Echo report from 04/07/18 reviewed and showed an EF of 15% along with trivial AR and mild MR.   Cardiac catheterization done 04/08/18 which showed proximal RCA lesion with 30% stenosis.  Admitted 04/06/18 due to acute on chronic HF. Cardiology and nephrology consults obtained. Elevated troponin thought to be due to demand ischemia. Catheterization done and she was discharged after 3 days. Was in the ED 01/17/18 due to COPD exacerbation where she was treated and released.   She presents today for a follow-up visit although hasn't been seen since February 2017. She presents with a chief complaint of minimal shortness of breath upon moderate exertion. She describes this as chronic in nature having been present for several years. She does feel like her breathing has improved recently. She has associated fatigue, pedal edema, difficulty sleeping and left foot pain. She denies any abdominal distention, palpitations, chest pain, cough, dizziness or weight gain.   Past Medical History:  Diagnosis Date  . CAD (coronary artery disease)   . Chronic combined systolic and diastolic CHF (congestive heart failure) (HCC)   . COPD (chronic obstructive pulmonary disease) (HCC)   . Diabetes mellitus without complication (HCC)   . GERD (gastroesophageal reflux disease)   . Hypertension   . PVD (peripheral vascular disease) (HCC)   . Stroke (cerebrum) [facility](HCC)    Past Surgical History:  Procedure Laterality Date  . ABDOMINAL HYSTERECTOMY    . CARDIAC CATHETERIZATION    . COLONOSCOPY    . EYE SURGERY Bilateral   . LEFT HEART CATH AND CORONARY ANGIOGRAPHY N/A 04/08/2018   Procedure: LEFT HEART CATH AND CORONARY ANGIOGRAPHY;  Surgeon: Laurier NancyKhan, Shaukat A, MD;  Location: [facility] INVASIVE CV LAB;  Service:  Cardiovascular;  Laterality: N/A;   Family History  Problem Relation Age of Onset  . Diabetes Mother   . Cancer Father   . Diabetes Maternal Aunt   . Diabetes Maternal Aunt    Social History   Tobacco Use  . Smoking status: Current Every Day Smoker    Packs/day: 1.00    Types: Cigarettes  . Smokeless tobacco: Never Used  Substance Use Topics  . Alcohol use: No    Alcohol/week: 0.0 standard drinks   Allergies  Allergen Reactions  . Colchicine     itching  . Sulfa Antibiotics Rash    itching  . Levaquin [Levofloxacin In D5w] Rash   Prior to Admission medications   Medication Sig Start Date End Date Taking? Authorizing Provider  albuterol (PROVENTIL) (2.5 MG/3ML) 0.083% nebulizer solution Take 3 mLs (2.5 mg total) by nebulization every 6 (six) hours as needed for wheezing or shortness of breath. 05/26/15  Yes Altamese DillingVachhani, Vaibhavkumar, MD  albuterol (VENTOLIN HFA) 108 (90 Base) MCG/ACT inhaler Inhale 2 puffs into the lungs every 6 (six) hours as needed for wheezing or shortness of breath.    Yes [provider]  aspirin EC 81 MG tablet Take 81 mg by mouth daily.   Yes [provider]  budesonide-formoterol (SYMBICORT) 160-4.5 MCG/ACT inhaler Inhale 2 puffs into the lungs 2 (two) times daily.   Yes [provider]  carvedilol (COREG) 6.25 MG tablet Take 6.25 mg by mouth 2 (two) times daily with a meal.   Yes [provider]  cholecalciferol (VITAMIN D3) 25 MCG (1000 UT) tablet Take 1,000 Units by mouth daily.   Yes [provider]  cilostazol (PLETAL) 100 MG tablet Take 100 mg by mouth 2 (two) times daily.   Yes [provider]  Dulaglutide 1.5 MG/0.5ML SOPN Inject 1.5 mg into the skin every Monday.   Yes [provider]  esomeprazole (NEXIUM) 40 MG capsule Take 40 mg by mouth daily.    Yes [provider]  ferrous sulfate 325 (65 FE) MG tablet Take 325 mg by mouth daily with breakfast.   Yes [provider]  fluticasone (FLONASE) 50 MCG/ACT nasal spray Place 1 spray into both nostrils 2 (two) times daily.   Yes [provider]  furosemide (LASIX) 40 MG tablet Take 1 tablet (40 mg total) by mouth daily. 04/09/18  Yes Mody, Patricia Pesa, MD  insulin aspart (NOVOLOG) 100 UNIT/ML injection Inject 10 Units into the skin 3 (three) times daily with meals.   Yes [provider]  insulin glargine (LANTUS) 100 UNIT/ML injection Inject 45 Units into the skin at bedtime.    Yes [provider]  levocetirizine (XYZAL) 5 MG tablet Take 5 mg by mouth at bedtime.    Yes [provider]  metFORMIN (GLUCOPHAGE) 500 MG tablet Take 500 mg by mouth 2 (two) times daily with a meal.    Yes [provider]  Multiple Vitamins-Minerals (SENTRY SENIOR) TABS Take 1 tablet by mouth daily.   Yes [provider]  nitroGLYCERIN (NITROSTAT) 0.4 MG SL tablet Place 0.4 mg under the tongue every 5 (five) minutes as needed for chest pain.   Yes [provider]  pramipexole (MIRAPEX) 0.125 MG tablet Take 0.125 mg by mouth at bedtime.    Yes [provider]  pregabalin (LYRICA) 150 MG capsule Take 150 mg by mouth 3 (three) times daily.    Yes [provider]  rosuvastatin (CRESTOR) 40 MG tablet Take 40 mg by mouth daily.   Yes [provider]  sacubitril-valsartan (ENTRESTO) 49-51 MG Take 1 tablet by mouth 2 (two) times daily.   Yes [provider]  spironolactone (ALDACTONE) 25 MG tablet Take 25 mg by mouth daily.    Yes [provider]  Tiotropium Bromide Monohydrate (SPIRIVA RESPIMAT) 2.5 MCG/ACT AERS Inhale 2 puffs into the lungs daily.   Yes [provider]  Venlafaxine HCl 225 MG TB24 Take 225 mg by mouth daily.    Yes [provider]  vitamin C (ASCORBIC ACID) 500 MG tablet Take 500 mg by mouth daily.   Yes [provider]    Review of Systems  Constitutional: Positive for fatigue (with moderate  exertion). Negative for appetite change.  HENT: Negative for congestion, rhinorrhea and sore throat.   Eyes: Negative.   Respiratory: Positive for shortness of breath (with moderate exertion). Negative for cough and chest tightness.   Cardiovascular: Positive for leg swelling (left foot). Negative for chest pain and palpitations.  Gastrointestinal: Negative for abdominal distention and abdominal pain.  Endocrine: Negative.   Genitourinary: Negative.   Musculoskeletal: Positive for arthralgias (left foot). Negative for neck pain.  Skin: Negative.   Allergic/Immunologic: Negative.   Neurological: Negative for dizziness and light-headedness.  Hematological: Negative for adenopathy. Does not bruise/bleed easily.  Psychiatric/Behavioral: Positive for sleep disturbance (sleeping on 1-2 pillows). Negative for dysphoric mood. The patient is not nervous/anxious.     Vitals:   04/15/18 0933  BP: (!) 105/57  Pulse: 96  Resp:  18  SpO2: 98%  Weight: 200 lb 6 oz (90.9 kg)  Height: 5\' 6"  (1.676 m)   Wt Readings from Last 3 Encounters:  04/15/18 200 lb 6 oz (90.9 kg)  04/09/18 191 lb 3.2 oz (86.7 kg)  01/17/18 201 lb (91.2 kg)   Lab Results  Component Value Date   CREATININE 1.34 (H) 04/09/2018   CREATININE 1.36 (H) 04/07/2018   CREATININE 1.33 (H) 04/06/2018    Physical Exam Vitals signs and nursing note reviewed.  Constitutional:      Appearance: Normal appearance.  HENT:     Head: Normocephalic and atraumatic.  Neck:     Musculoskeletal: Normal range of motion and neck supple.  Cardiovascular:     Rate and Rhythm: Regular rhythm. Tachycardia present.  Pulmonary:     Effort: Pulmonary effort is normal.     Breath sounds: Normal breath sounds.  Abdominal:     General: There is no distension.     Palpations: Abdomen is soft.  Musculoskeletal:        General: No tenderness.     Left lower leg: Edema (trace edema in left foot) present.  Skin:    General: Skin is warm and dry.   Neurological:     General: No focal deficit present.     Mental Status: She is alert and oriented to person, place, and time.  Psychiatric:        Mood and Affect: Mood normal.        Behavior: Behavior normal.    Assessment & Plan:  1: Chronic heart failure with reduced ejection fraction- - NYHA class II - euvolemic - weighing daily and she was reminded to call for an overnight weight gain of > 2 pounds or a weekly weight gain of >5 pounds - not adding salt to her food and we reviewed the importance of closely following a 2000mg  sodium diet - has recently had her carvedilol increased by cardiologist - returns to local cardiology [facility]) in one week - doubt that her BP could tolerate entresto titration - currently wearing a lifevest - BNP 04/05/90 was 542.0 - saw cardiology (R[facility] @ [facility]) 01/14/18 - PharmD reconciled medications with the patient - she says that she's received her flu vaccine for this season  2: HTN- - BP looks good today although on the low side - saw PCP (Noorani @ [facility]) 02/18/18 - BMP from 04/09/18 reviewed and showed sodium 141, potassium 4.2, creatinine 1.34 and GFR 49  3: DM- - A1c 04/06/18 was 8.2% - glucose at home today was 145 - saw nephrology ([place]) 03/18/18  Patient did not bring her medications nor a list. Each medication was verbally reviewed with the patient and she was encouraged to bring the bottles to every visit to confirm accuracy of list.  Return in 1 month or sooner for any questions/problems before then.

## 2018-04-15 ENCOUNTER — Encounter: Payer: Self-pay | Admitting: Family

## 2018-04-15 ENCOUNTER — Ambulatory Visit: Payer: Medicare HMO | Attending: Family | Admitting: Family

## 2018-04-15 VITALS — BP 105/57 | HR 96 | Resp 18 | Ht 66.0 in | Wt 200.4 lb

## 2018-04-15 DIAGNOSIS — Z833 Family history of diabetes mellitus: Secondary | ICD-10-CM | POA: Insufficient documentation

## 2018-04-15 DIAGNOSIS — Z882 Allergy status to sulfonamides status: Secondary | ICD-10-CM | POA: Diagnosis not present

## 2018-04-15 DIAGNOSIS — I5022 Chronic systolic (congestive) heart failure: Secondary | ICD-10-CM | POA: Diagnosis present

## 2018-04-15 DIAGNOSIS — F17210 Nicotine dependence, cigarettes, uncomplicated: Secondary | ICD-10-CM | POA: Insufficient documentation

## 2018-04-15 DIAGNOSIS — Z7951 Long term (current) use of inhaled steroids: Secondary | ICD-10-CM | POA: Diagnosis not present

## 2018-04-15 DIAGNOSIS — I10 Essential (primary) hypertension: Secondary | ICD-10-CM

## 2018-04-15 DIAGNOSIS — I110 Hypertensive heart disease with heart failure: Secondary | ICD-10-CM | POA: Insufficient documentation

## 2018-04-15 DIAGNOSIS — Z79899 Other long term (current) drug therapy: Secondary | ICD-10-CM | POA: Diagnosis not present

## 2018-04-15 DIAGNOSIS — I5042 Chronic combined systolic (congestive) and diastolic (congestive) heart failure: Secondary | ICD-10-CM | POA: Diagnosis not present

## 2018-04-15 DIAGNOSIS — K219 Gastro-esophageal reflux disease without esophagitis: Secondary | ICD-10-CM | POA: Insufficient documentation

## 2018-04-15 DIAGNOSIS — J449 Chronic obstructive pulmonary disease, unspecified: Secondary | ICD-10-CM | POA: Insufficient documentation

## 2018-04-15 DIAGNOSIS — I2510 Atherosclerotic heart disease of native coronary artery without angina pectoris: Secondary | ICD-10-CM | POA: Insufficient documentation

## 2018-04-15 DIAGNOSIS — Z794 Long term (current) use of insulin: Secondary | ICD-10-CM | POA: Diagnosis not present

## 2018-04-15 DIAGNOSIS — Z7982 Long term (current) use of aspirin: Secondary | ICD-10-CM | POA: Insufficient documentation

## 2018-04-15 DIAGNOSIS — Z881 Allergy status to other antibiotic agents status: Secondary | ICD-10-CM | POA: Diagnosis not present

## 2018-04-15 DIAGNOSIS — E1151 Type 2 diabetes mellitus with diabetic peripheral angiopathy without gangrene: Secondary | ICD-10-CM | POA: Insufficient documentation

## 2018-04-15 DIAGNOSIS — Z8673 Personal history of transient ischemic attack (TIA), and cerebral infarction without residual deficits: Secondary | ICD-10-CM | POA: Diagnosis not present

## 2018-04-15 DIAGNOSIS — E1122 Type 2 diabetes mellitus with diabetic chronic kidney disease: Secondary | ICD-10-CM

## 2018-04-15 DIAGNOSIS — N183 Chronic kidney disease, stage 3 (moderate): Secondary | ICD-10-CM

## 2018-04-15 NOTE — Progress Notes (Unsigned)
[facility] - HEART FAILURE CLINIC - PHARMACIST COUNSELING NOTE   ASSESSMENT   (Brief summary of the presentation/problems identified):   Adherence assessment   Patient is using Pillbox as an adherence strategy.   Do you ever forget to take your medication? [] Yes (1) [x] No (0)  Do you ever skip doses due to side effects? [] Yes (1) [x] No (0)  Do you have trouble affording your medicines? [] Yes (1) [x] No (0)  Are you ever unable to pick up your medication due to transportation difficulties? [] Yes (1) [x] No (0)  Do you ever stop taking your medications because you don't believe they are helping? [] Yes (1) [x] No (0)  Total score _0______      Guideline-Directed Medical Therapy/Evidence Based Medicine   ACE/ARB/ARNI: Ihor GullyEntresto   Beta Blocker: Carvedilol   Aldosterone Antagonist: Spironolactone Diuretic: Furosemide   (Problem 1):  HR 96   (Problem 2): BP 105/57   PLAN   (Plan 1): Coreg recently increased to 6.25 mg BID.  Will need to monitor HR and reassess at next visit for possible increase in dose.   (Plan 2):  BP medications all recently increased in dose (a few days prior to visit) therefore will need to monitor BP and reassess medication doses.   SUBJECTIVE   HPI:   Past Medical History:  Diagnosis Date  . CAD (coronary artery disease)   . Chronic combined systolic and diastolic CHF (congestive heart failure) (HCC)   . COPD (chronic obstructive pulmonary disease) (HCC)   . Diabetes mellitus without complication (HCC)   . GERD (gastroesophageal reflux disease)   . Hypertension   . PVD (peripheral vascular disease) (HCC)   . Stroke (cerebrum) (HCC)       Current Outpatient Medications:  .  albuterol (PROVENTIL) (2.5 MG/3ML) 0.083% nebulizer solution, Take 3 mLs (2.5 mg total) by nebulization every 6 (six) hours as needed for wheezing or shortness of breath., Disp: 75 mL, Rfl: 2 .  albuterol (VENTOLIN HFA) 108 (90 Base) MCG/ACT inhaler,  Inhale 2 puffs into the lungs every 6 (six) hours as needed for wheezing or shortness of breath. , Disp: , Rfl:  .  aspirin EC 81 MG tablet, Take 81 mg by mouth daily., Disp: , Rfl:  .  budesonide-formoterol (SYMBICORT) 160-4.5 MCG/ACT inhaler, Inhale 2 puffs into the lungs 2 (two) times daily., Disp: , Rfl:  .  carvedilol (COREG) 3.125 MG tablet, Take 1 tablet (3.125 mg total) by mouth 2 (two) times daily with a meal. (Patient taking differently: Take 6.25 mg by mouth 2 (two) times daily with a meal. ), Disp: 60 tablet, Rfl: 0 .  cholecalciferol (VITAMIN D3) 25 MCG (1000 UT) tablet, Take 1,000 Units by mouth daily., Disp: , Rfl:  .  cilostazol (PLETAL) 100 MG tablet, Take 100 mg by mouth 2 (two) times daily., Disp: , Rfl:  .  Dulaglutide 1.5 MG/0.5ML SOPN, Inject 1.5 mg into the skin every Monday., Disp: , Rfl:  .  esomeprazole (NEXIUM) 40 MG capsule, Take 40 mg by mouth daily. , Disp: , Rfl:  .  ferrous sulfate 325 (65 FE) MG tablet, Take 325 mg by mouth daily with breakfast., Disp: , Rfl:  .  fluticasone (FLONASE) 50 MCG/ACT nasal spray, Place 1 spray into both nostrils 2 (two) times daily., Disp: , Rfl:  .  furosemide (LASIX) 40 MG tablet, Take 1 tablet (40 mg total) by mouth daily., Disp: 30 tablet, Rfl: 0 .  insulin aspart (NOVOLOG) 100 UNIT/ML injection, Inject 10 Units  into the skin 3 (three) times daily with meals., Disp: , Rfl:  .  insulin glargine (LANTUS) 100 UNIT/ML injection, Inject 45 Units into the skin at bedtime. , Disp: , Rfl:  .  levocetirizine (XYZAL) 5 MG tablet, Take 5 mg by mouth at bedtime. , Disp: , Rfl:  .  metFORMIN (GLUCOPHAGE) 500 MG tablet, Take 500 mg by mouth 2 (two) times daily with a meal. , Disp: , Rfl:  .  Multiple Vitamins-Minerals (SENTRY SENIOR) TABS, Take 1 tablet by mouth daily., Disp: , Rfl:  .  nitroGLYCERIN (NITROSTAT) 0.4 MG SL tablet, Place 0.4 mg under the tongue every 5 (five) minutes as needed for chest pain., Disp: , Rfl:  .  pramipexole (MIRAPEX)  0.125 MG tablet, Take 0.125 mg by mouth at bedtime. , Disp: , Rfl:  .  pregabalin (LYRICA) 150 MG capsule, Take 150 mg by mouth 3 (three) times daily. , Disp: , Rfl:  .  rosuvastatin (CRESTOR) 40 MG tablet, Take 40 mg by mouth daily., Disp: , Rfl:  .  sacubitril-valsartan (ENTRESTO) 24-26 MG, Take 1 tablet by mouth 2 (two) times daily. (Patient not taking: Reported on 04/15/2018), Disp: 60 tablet, Rfl: 0 .  sacubitril-valsartan (ENTRESTO) 49-51 MG, Take 1 tablet by mouth 2 (two) times daily., Disp: , Rfl:  .  spironolactone (ALDACTONE) 25 MG tablet, Take 25 mg by mouth daily. , Disp: , Rfl:  .  Tiotropium Bromide Monohydrate (SPIRIVA RESPIMAT) 2.5 MCG/ACT AERS, Inhale 2 puffs into the lungs daily., Disp: , Rfl:  .  Venlafaxine HCl 225 MG TB24, Take 225 mg by mouth daily. , Disp: , Rfl:  .  vitamin C (ASCORBIC ACID) 500 MG tablet, Take 500 mg by mouth daily., Disp: , Rfl:     OBJECTIVE    BMP Latest Ref Rng & Units 04/09/2018 04/07/2018 04/06/2018  Glucose 70 - 99 mg/dL 132(H) 126(H) 194(H)  BUN 8 - 23 mg/dL 21 18 17   Creatinine 0.44 - 1.00 mg/dL 1.34(H) 1.36(H) 1.33(H)  Sodium 135 - 145 mmol/L 141 142 139  Potassium 3.5 - 5.1 mmol/L 4.2 4.5 4.9  Chloride 98 - 111 mmol/L 105 105 101  CO2 22 - 32 mmol/L 28 30 27   Calcium 8.9 - 10.3 mg/dL 8.9 9.0 8.6(L)    Vital signs: HR 96, BP 105/57, weight (kg) 200.6 lbs  PE: Deferred  ECHO: Date 04/07/18, EF 15%, notes  Cath: Date 04/08/18, EF 10-15%, notes: Mild CAD with proximal RCA about 30% and normal left circumflex and LAD with normal left main.  LV gram was deferred due to renal insufficiency and LVEDP was 21.  Patient's ejection fraction on echocardiogram was noted to be between 10 to 15% with severely dilated left ventricle.Marland Kitchen   DRUGS TO AVOID IN HEART FAILURE  Drug or Class Mechanism  Analgesics . NSAIDs . COX-2 inhibitors . Glucocorticoids  Sodium and water retention, increased systemic vascular resistance, decreased response to diuretics    Diabetes Medications . Metformin . Thiazolidinediones o Rosiglitazone (Avandia) o Pioglitazone (Actos) . DPP4 Inhibitors o Saxagliptin (Onglyza) o Sitagliptin (Januvia)   Lactic acidosis Possible calcium channel blockade   Unknown  Antiarrhythmics . Class I  o Flecainide o Disopyramide . Class III o Sotalol . Other o Dronedarone  Negative inotrope, proarrhythmic   Proarrhythmic, beta blockade  Negative inotrope  Antihypertensives . Alpha Blockers o Doxazosin . Calcium Channel Blockers o Diltiazem o Verapamil o Nifedipine . Central Alpha Adrenergics o Moxonidine . Peripheral Vasodilators o Minoxidil  Increases renin and  aldosterone  Negative inotrope    Possible sympathetic withdrawal  Unknown  Anti-infective . Itraconazole . Amphotericin B  Negative inotrope Unknown  Hematologic . Anagrelide . Cilostazol   Possible inhibition of PD IV Inhibition of PD III causing arrhythmias  Neurologic/Psychiatric . Stimulants . Anti-Seizure Drugs o Carbamazepine o Pregabalin . Antidepressants o Tricyclics o Citalopram . Parkinsons o Bromocriptine o Pergolide o Pramipexole . Antipsychotics o Clozapine . Antimigraine o Ergotamine o Methysergide . Appetite suppressants . Bipolar o Lithium  Peripheral alpha and beta agonist activity  Negative inotrope and chronotrope Calcium channel blockade  Negative inotrope, proarrhythmic Dose-dependent QT prolongation  Excessive serotonin activity/valvular damage Excessive serotonin activity/valvular damage Unknown  IgE mediated hypersensitivy, calcium channel blockade  Excessive serotonin activity/valvular damage Excessive serotonin activity/valvular damage Valvular damage  Direct myofibrillar degeneration, adrenergic stimulation  Antimalarials . Chloroquine . Hydroxychloroquine Intracellular inhibition of lysosomal enzymes  Urologic Agents . Alpha  Blockers o Doxazosin o Prazosin o Tamsulosin o Terazosin  Increased renin and aldosterone  Adapted from Page RL, et al. "Drugs That May Cause or Exacerbate Heart Failure: A Scientific Statement from the [place]." Circulation 2016; 782:N56-O13. DOI: 10.1161/CIR.0000000000000426   COUNSELING POINTS/CLINICAL PEARLS (*** DELETE ANY DRUGS NOT ON PATIENTS MEDICATION LIST***) Carvedilol (Goal: weight less than 85 kg is 25 mg BID, weight greater than 85 kg is 50 mg BID)  Patient should avoid activities requiring coordination until drug effects are realized, as drug may cause dizziness.  This drug may cause diarrhea, nausea, vomiting, arthralgia, back pain, myalgia, headache, vision disorder, erectile dysfunction, reduced libido, or fatigue.  Instruct patient to report signs/symptoms of adverse cardiovascular effects such as hypotension (especially in elderly patients), arrhythmias, syncope, palpitations, angina, or edema.  Drug may mask symptoms of hypoglycemia. Advise diabetic patients to carefully monitor blood sugar levels.  Patient should take drug with food.  Advise patient against sudden discontinuation of drug.  Entresto (Goal: 97/103 mg twice daily)  Warn female patient to avoid pregnancy during therapy and to report a pregnancy to a physician.  Advise patient to report symptomatic hypotension.  Side effects may include hyperkalemia, cough, dizziness, or renal failure. Furosemide  Drug causes sun-sensitivity. Advise patient to use sunscreen and avoid tanning beds. Patient should avoid activities requiring coordination until drug effects are realized, as drug may cause dizziness, vertigo, or blurred vision. This drug may cause hyperglycemia, hyperuricemia, constipation, diarrhea, loss of appetite, nausea, vomiting, purpuric disorder, cramps, spasticity, asthenia, headache, paresthesia, or scaling eczema. Instruct patient to report unusual bleeding/bruising or  signs/symptoms of hypotension, infection, pancreatitis, or ototoxicity (tinnitus, hearing impairment). Advise patient to report signs/symptoms of a severe skin reactions (flu-like symptoms, spreading red rash, or skin/mucous membrane blistering) or erythema multiforme. Instruct patient to eat high-potassium foods during drug therapy, as directed by healthcare professional.  Patient should not drink alcohol while taking this drug.   MEDICATION ADHERENCES TIPS AND STRATEGIES 1. Taking medication as prescribed improves patient outcomes in heart failure (reduces hospitalizations, improves symptoms, increases survival) 2. Side effects of medications can be managed by decreasing doses, switching agents, stopping drugs, or adding additional therapy. Please let someone in the [facility] know if you have having bothersome side effects so we can modify your regimen. Do not alter your medication regimen without talking to [place].  3. Medication reminders can help patients remember to take drugs on time. If you are missing or forgetting doses you can try linking behaviors, using pill boxes, or an electronic reminder like an alarm on  your phone or an app. Some people can also get automated phone calls as medication reminders.   Time spent: 10 min  Forrest Moron, Pharm.D. Clinical Pharmacist 04/15/18

## 2018-04-15 NOTE — Patient Instructions (Signed)
Continue weighing daily and call for an overnight weight gain of > 2 pounds or a weekly weight gain of >5 pounds. 

## 2018-04-29 ENCOUNTER — Emergency Department: Payer: Medicare HMO

## 2018-04-29 ENCOUNTER — Inpatient Hospital Stay
Admission: EM | Admit: 2018-04-29 | Discharge: 2018-05-02 | DRG: 291 | Disposition: A | Discharge status: Home Health | Payer: Medicare HMO | Attending: Internal Medicine | Admitting: Internal Medicine

## 2018-04-29 ENCOUNTER — Other Ambulatory Visit: Payer: Self-pay

## 2018-04-29 DIAGNOSIS — E1151 Type 2 diabetes mellitus with diabetic peripheral angiopathy without gangrene: Secondary | ICD-10-CM | POA: Diagnosis present

## 2018-04-29 DIAGNOSIS — Z888 Allergy status to other drugs, medicaments and biological substances status: Secondary | ICD-10-CM

## 2018-04-29 DIAGNOSIS — I2510 Atherosclerotic heart disease of native coronary artery without angina pectoris: Secondary | ICD-10-CM | POA: Diagnosis present

## 2018-04-29 DIAGNOSIS — Z7951 Long term (current) use of inhaled steroids: Secondary | ICD-10-CM | POA: Diagnosis not present

## 2018-04-29 DIAGNOSIS — Z8673 Personal history of transient ischemic attack (TIA), and cerebral infarction without residual deficits: Secondary | ICD-10-CM

## 2018-04-29 DIAGNOSIS — I5043 Acute on chronic combined systolic (congestive) and diastolic (congestive) heart failure: Secondary | ICD-10-CM | POA: Diagnosis present

## 2018-04-29 DIAGNOSIS — Z794 Long term (current) use of insulin: Secondary | ICD-10-CM

## 2018-04-29 DIAGNOSIS — I5023 Acute on chronic systolic (congestive) heart failure: Secondary | ICD-10-CM | POA: Diagnosis not present

## 2018-04-29 DIAGNOSIS — I351 Nonrheumatic aortic (valve) insufficiency: Secondary | ICD-10-CM | POA: Diagnosis not present

## 2018-04-29 DIAGNOSIS — I10 Essential (primary) hypertension: Secondary | ICD-10-CM | POA: Diagnosis present

## 2018-04-29 DIAGNOSIS — Z881 Allergy status to other antibiotic agents status: Secondary | ICD-10-CM

## 2018-04-29 DIAGNOSIS — I509 Heart failure, unspecified: Secondary | ICD-10-CM

## 2018-04-29 DIAGNOSIS — E119 Type 2 diabetes mellitus without complications: Secondary | ICD-10-CM

## 2018-04-29 DIAGNOSIS — I130 Hypertensive heart and chronic kidney disease with heart failure and stage 1 through stage 4 chronic kidney disease, or unspecified chronic kidney disease: Principal | ICD-10-CM | POA: Diagnosis present

## 2018-04-29 DIAGNOSIS — Z79899 Other long term (current) drug therapy: Secondary | ICD-10-CM | POA: Diagnosis not present

## 2018-04-29 DIAGNOSIS — F329 Major depressive disorder, single episode, unspecified: Secondary | ICD-10-CM | POA: Diagnosis present

## 2018-04-29 DIAGNOSIS — F17210 Nicotine dependence, cigarettes, uncomplicated: Secondary | ICD-10-CM | POA: Diagnosis present

## 2018-04-29 DIAGNOSIS — I340 Nonrheumatic mitral (valve) insufficiency: Secondary | ICD-10-CM | POA: Diagnosis not present

## 2018-04-29 DIAGNOSIS — E1122 Type 2 diabetes mellitus with diabetic chronic kidney disease: Secondary | ICD-10-CM | POA: Diagnosis present

## 2018-04-29 DIAGNOSIS — N183 Chronic kidney disease, stage 3 (moderate): Secondary | ICD-10-CM | POA: Diagnosis present

## 2018-04-29 DIAGNOSIS — Z9981 Dependence on supplemental oxygen: Secondary | ICD-10-CM

## 2018-04-29 DIAGNOSIS — J441 Chronic obstructive pulmonary disease with (acute) exacerbation: Secondary | ICD-10-CM | POA: Diagnosis present

## 2018-04-29 DIAGNOSIS — K219 Gastro-esophageal reflux disease without esophagitis: Secondary | ICD-10-CM | POA: Diagnosis present

## 2018-04-29 DIAGNOSIS — Z9989 Dependence on other enabling machines and devices: Secondary | ICD-10-CM | POA: Diagnosis not present

## 2018-04-29 DIAGNOSIS — G4733 Obstructive sleep apnea (adult) (pediatric): Secondary | ICD-10-CM | POA: Diagnosis present

## 2018-04-29 DIAGNOSIS — Z882 Allergy status to sulfonamides status: Secondary | ICD-10-CM | POA: Diagnosis not present

## 2018-04-29 DIAGNOSIS — J9601 Acute respiratory failure with hypoxia: Secondary | ICD-10-CM | POA: Diagnosis present

## 2018-04-29 DIAGNOSIS — Z7982 Long term (current) use of aspirin: Secondary | ICD-10-CM | POA: Diagnosis not present

## 2018-04-29 DIAGNOSIS — J9600 Acute respiratory failure, unspecified whether with hypoxia or hypercapnia: Secondary | ICD-10-CM

## 2018-04-29 LAB — BASIC METABOLIC PANEL
Anion gap: 7 (ref 5–15)
BUN: 18 mg/dL (ref 8–23)
CO2: 28 mmol/L (ref 22–32)
Calcium: 9 mg/dL (ref 8.9–10.3)
Chloride: 103 mmol/L (ref 98–111)
Creatinine, Ser: 1.41 mg/dL — ABNORMAL HIGH (ref 0.44–1.00)
GFR calc Af Amer: 46 mL/min — ABNORMAL LOW (ref >60)
GFR, EST NON AFRICAN AMERICAN: 40 mL/min — AB (ref >60)
Glucose, Bld: 249 mg/dL — ABNORMAL HIGH (ref 70–99)
Potassium: 4.3 mmol/L (ref 3.5–5.1)
Sodium: 138 mmol/L (ref 135–145)

## 2018-04-29 LAB — CBC WITH DIFFERENTIAL/PLATELET
Abs Immature Granulocytes: 0.03 10*3/uL (ref 0.00–0.07)
BASOS PCT: 0 %
Basophils Absolute: 0 10*3/uL (ref 0.0–0.1)
Eosinophils Absolute: 0 10*3/uL (ref 0.0–0.5)
Eosinophils Relative: 0 %
HCT: 40.3 % (ref 36.0–46.0)
Hemoglobin: 12.2 g/dL (ref 12.0–15.0)
Immature Granulocytes: 0 %
Lymphocytes Relative: 24 %
Lymphs Abs: 2.1 10*3/uL (ref 0.7–4.0)
MCH: 26.5 pg (ref 26.0–34.0)
MCHC: 30.3 g/dL (ref 30.0–36.0)
MCV: 87.6 fL (ref 80.0–100.0)
Monocytes Absolute: 0.8 10*3/uL (ref 0.1–1.0)
Monocytes Relative: 9 %
Neutro Abs: 5.6 10*3/uL (ref 1.7–7.7)
Neutrophils Relative %: 67 %
PLATELETS: 161 10*3/uL (ref 150–400)
RBC: 4.6 MIL/uL (ref 3.87–5.11)
RDW: 15.7 % — ABNORMAL HIGH (ref 11.5–15.5)
WBC: 8.5 10*3/uL (ref 4.0–10.5)
nRBC: 0 % (ref 0.0–0.2)

## 2018-04-29 LAB — URINALYSIS, COMPLETE (UACMP) WITH MICROSCOPIC
Bilirubin Urine: NEGATIVE
Glucose, UA: 50 mg/dL — AB
Hgb urine dipstick: NEGATIVE
Ketones, ur: NEGATIVE mg/dL
Leukocytes, UA: NEGATIVE
Nitrite: NEGATIVE
PROTEIN: NEGATIVE mg/dL
Specific Gravity, Urine: 1.006 (ref 1.005–1.030)
pH: 5 (ref 5.0–8.0)

## 2018-04-29 LAB — INFLUENZA PANEL BY PCR (TYPE A & B)
Influenza A By PCR: NEGATIVE
Influenza B By PCR: NEGATIVE

## 2018-04-29 LAB — LACTIC ACID, PLASMA: Lactic Acid, Venous: 1.7 mmol/L (ref 0.5–1.9)

## 2018-04-29 LAB — BRAIN NATRIURETIC PEPTIDE: B Natriuretic Peptide: 4309 pg/mL — ABNORMAL HIGH (ref 0.0–100.0)

## 2018-04-29 LAB — TROPONIN I: Troponin I: 0.19 ng/mL (ref <0.03)

## 2018-04-29 MED ORDER — MAGNESIUM SULFATE 2 GM/50ML IV SOLN: 2.0000 g | Freq: Once | INTRAVENOUS | Status: DC

## 2018-04-29 MED ORDER — FUROSEMIDE 10 MG/ML IJ SOLN
80.0000 mg | Freq: Once | INTRAMUSCULAR | Status: AC
  Administered 2018-04-29: 80 mg via INTRAVENOUS

## 2018-04-29 MED ORDER — FUROSEMIDE 10 MG/ML IJ SOLN
INTRAMUSCULAR | Status: AC
  Administered 2018-04-29: 80 mg via INTRAVENOUS
  Filled 2018-04-29: qty 8

## 2018-04-29 MED ORDER — MAGNESIUM SULFATE 2 GM/50ML IV SOLN
INTRAVENOUS | Status: AC
  Administered 2018-04-29: 2 g
  Filled 2018-04-29: qty 50

## 2018-04-29 NOTE — ED Notes (Signed)
Port xray at bedside.  

## 2018-04-29 NOTE — ED Notes (Signed)
EDP Siadecki notified in person of Trop 0.19.

## 2018-04-29 NOTE — ED Notes (Signed)
Mag completed

## 2018-04-29 NOTE — H&P (Signed)
[facility] - [place] at [facility]   PATIENT NAME: Alexis Ray    MR#:  [id number]  DATE OF BIRTH:  1955-05-27  DATE OF ADMISSION:  04/29/2018  PRIMARY CARE PHYSICIAN: Keane Police, MD   REQUESTING/REFERRING PHYSICIAN: Marisa Severin, MD  CHIEF COMPLAINT:   Chief Complaint  Patient presents with  . Shortness of Breath    HISTORY OF PRESENT ILLNESS:  Alexis Ray  is a 62 y.o. female who presents with chief complaint as above.  Patient presents to the ED after several days of progressive shortness of breath.  She states that this is been associated with significant wheezing, and some cough productive of clear sputum.  Here in the ED her evaluation is consistent with COPD exacerbation as well as potentially heart failure exacerbation.  She arrived in the ED on CPAP via EMS, and was converted to BiPAP once here.  Her BNP is significantly elevated, greater than 4000.  However, her chest x-ray only shows mild vascular congestion with no frank edema or effusions.  Hospitalist were called for admission and treatment  PAST MEDICAL HISTORY:   Past Medical History:  Diagnosis Date  . CAD (coronary artery disease)   . Chronic combined systolic and diastolic CHF (congestive heart failure) (HCC)   . COPD (chronic obstructive pulmonary disease) (HCC)   . Diabetes mellitus without complication (HCC)   . GERD (gastroesophageal reflux disease)   . Hypertension   . PVD (peripheral vascular disease) (HCC)   . Stroke (cerebrum) (HCC)      PAST SURGICAL HISTORY:   Past Surgical History:  Procedure Laterality Date  . ABDOMINAL HYSTERECTOMY    . CARDIAC CATHETERIZATION    . COLONOSCOPY    . EYE SURGERY Bilateral   . LEFT HEART CATH AND CORONARY ANGIOGRAPHY N/A 04/08/2018   Procedure: LEFT HEART CATH AND CORONARY ANGIOGRAPHY;  Surgeon: Laurier Nancy, MD;  Location: [facility] INVASIVE CV LAB;  Service: Cardiovascular;  Laterality: N/A;     SOCIAL HISTORY:   Social  History   Tobacco Use  . Smoking status: Current Every Day Smoker    Packs/day: 1.00    Types: Cigarettes  . Smokeless tobacco: Never Used  Substance Use Topics  . Alcohol use: No    Alcohol/week: 0.0 standard drinks     FAMILY HISTORY:   Family History  Problem Relation Age of Onset  . Diabetes Mother   . Cancer Father   . Diabetes Maternal Aunt   . Diabetes Maternal Aunt      DRUG ALLERGIES:   Allergies  Allergen Reactions  . Colchicine     itching  . Sulfa Antibiotics Rash    itching  . Levaquin [Levofloxacin In D5w] Rash    MEDICATIONS AT HOME:   Prior to Admission medications   Medication Sig Start Date End Date Taking? Authorizing Provider  albuterol (PROVENTIL) (2.5 MG/3ML) 0.083% nebulizer solution Take 3 mLs (2.5 mg total) by nebulization every 6 (six) hours as needed for wheezing or shortness of breath. 05/26/15   Altamese Dilling, MD  albuterol (VENTOLIN HFA) 108 (90 Base) MCG/ACT inhaler Inhale 2 puffs into the lungs every 6 (six) hours as needed for wheezing or shortness of breath.     [provider]  aspirin EC 81 MG tablet Take 81 mg by mouth daily.    [provider]  budesonide-formoterol (SYMBICORT) 160-4.5 MCG/ACT inhaler Inhale 2 puffs into the lungs 2 (two) times daily.    [provider]  carvedilol (COREG)  6.25 MG tablet Take 6.25 mg by mouth 2 (two) times daily with a meal.    [provider]  cholecalciferol (VITAMIN D3) 25 MCG (1000 UT) tablet Take 1,000 Units by mouth daily.    [provider]  cilostazol (PLETAL) 100 MG tablet Take 100 mg by mouth 2 (two) times daily.    [provider]  Dulaglutide 1.5 MG/0.5ML SOPN Inject 1.5 mg into the skin every Monday.    [provider]  esomeprazole (NEXIUM) 40 MG capsule Take 40 mg by mouth daily.     [provider]  ferrous sulfate 325 (65 FE) MG tablet Take 325 mg by mouth daily with breakfast.    [provider]  fluticasone (FLONASE) 50 MCG/ACT nasal spray Place 1 spray into both nostrils 2 (two) times daily.    [provider]  furosemide (LASIX) 40 MG tablet Take 1 tablet (40 mg total) by mouth daily. 04/09/18   Adrian SaranMody, Sital, MD  insulin aspart (NOVOLOG) 100 UNIT/ML injection Inject 10 Units into the skin 3 (three) times daily with meals.    [provider]  insulin glargine (LANTUS) 100 UNIT/ML injection Inject 45 Units into the skin at bedtime.     [provider]  levocetirizine (XYZAL) 5 MG tablet Take 5 mg by mouth at bedtime.     [provider]  metFORMIN (GLUCOPHAGE) 500 MG tablet Take 500 mg by mouth 2 (two) times daily with a meal.     [provider]  Multiple Vitamins-Minerals (SENTRY SENIOR) TABS Take 1 tablet by mouth daily.    [provider]  nitroGLYCERIN (NITROSTAT) 0.4 MG SL tablet Place 0.4 mg under the tongue every 5 (five) minutes as needed for chest pain.    [provider]  pramipexole (MIRAPEX) 0.125 MG tablet Take 0.125 mg by mouth at bedtime.     [provider]  pregabalin (LYRICA) 150 MG capsule Take 150 mg by mouth 3 (three) times daily.     [provider]  rosuvastatin (CRESTOR) 40 MG tablet Take 40 mg by mouth daily.    [provider]  sacubitril-valsartan (ENTRESTO) 49-51 MG Take 1 tablet by mouth 2 (two) times daily.    [provider]  spironolactone (ALDACTONE) 25 MG tablet Take 25 mg by mouth daily.     [provider]  Tiotropium Bromide Monohydrate (SPIRIVA RESPIMAT) 2.5 MCG/ACT AERS Inhale 2 puffs into the lungs daily.    [provider]  Venlafaxine HCl 225 MG TB24 Take 225 mg by mouth daily.     [provider]  vitamin C (ASCORBIC ACID) 500 MG tablet Take 500 mg by mouth daily.    [provider]    REVIEW OF SYSTEMS:  Review of Systems  Constitutional: Negative for chills, fever, malaise/fatigue and weight loss.  HENT:  Negative for ear pain, hearing loss and tinnitus.   Eyes: Negative for blurred vision, double vision, pain and redness.  Respiratory: Positive for cough, sputum production, shortness of breath and wheezing. Negative for hemoptysis.   Cardiovascular: Negative for chest pain, palpitations, orthopnea and leg swelling.  Gastrointestinal: Negative for abdominal pain, constipation, diarrhea, nausea and vomiting.  Genitourinary: Negative for dysuria, frequency and hematuria.  Musculoskeletal: Negative for back pain, joint pain and neck pain.  Skin:       No acne, rash, or lesions  Neurological: Negative for dizziness, tremors, focal weakness and weakness.  Endo/Heme/Allergies: Negative for polydipsia. Does not bruise/bleed easily.  Psychiatric/Behavioral:  Negative for depression. The patient is not nervous/anxious and does not have insomnia.      VITAL SIGNS:   Vitals:   04/29/18 2232 04/29/18 2234 04/29/18 2300 04/29/18 2330  BP: 119/78  138/78 128/83  Pulse:  100 (!) 102 100  Resp: (!) 26 (!) 27 (!) 26 (!) 22  TempSrc:      SpO2:  94% 94% 97%  Weight:      Height:       Wt Readings from Last 3 Encounters:  04/29/18 90 kg  04/15/18 90.9 kg  04/09/18 86.7 kg    PHYSICAL EXAMINATION:  Physical Exam  Vitals reviewed. Constitutional: She is oriented to person, place, and time. She appears well-developed and well-nourished. No distress.  HENT:  Head: Normocephalic and atraumatic.  Mouth/Throat: Oropharynx is clear and moist.  Eyes: Pupils are equal, round, and reactive to light. Conjunctivae and EOM are normal. No scleral icterus.  Neck: Normal range of motion. Neck supple. No JVD present. No thyromegaly present.  Cardiovascular: Normal rate, regular rhythm and intact distal pulses. Exam reveals no gallop and no friction rub.  No murmur heard. Respiratory: She is in respiratory distress (On BiPAP). She has wheezes. She has no rales.  GI: Soft. Bowel sounds are normal. She exhibits no  distension. There is no abdominal tenderness.  Musculoskeletal: Normal range of motion.        General: No edema.     Comments: No arthritis, no gout  Lymphadenopathy:    She has no cervical adenopathy.  Neurological: She is alert and oriented to person, place, and time. No cranial nerve deficit.  No dysarthria, no aphasia  Skin: Skin is warm and dry. No rash noted. No erythema.  Psychiatric: She has a normal mood and affect. Her behavior is normal. Judgment and thought content normal.    LABORATORY PANEL:   CBC Recent Labs  Lab 04/29/18 2128  WBC 8.5  HGB 12.2  HCT 40.3  PLT 161   ------------------------------------------------------------------------------------------------------------------  Chemistries  Recent Labs  Lab 04/29/18 2128  NA 138  K 4.3  CL 103  CO2 28  GLUCOSE 249*  BUN 18  CREATININE 1.41*  CALCIUM 9.0   ------------------------------------------------------------------------------------------------------------------  Cardiac Enzymes Recent Labs  Lab 04/29/18 2128  TROPONINI 0.19*   ------------------------------------------------------------------------------------------------------------------  RADIOLOGY:  Dg Chest Portable 1 View  Result Date: 04/29/2018 CLINICAL DATA:  Shortness of breath EXAM: PORTABLE CHEST 1 VIEW COMPARISON:  04/08/2018 FINDINGS: Cardiac shadow remains enlarged. Increased vascular congestion is noted with interstitial edema similar to that seen on the prior exam. No focal infiltrate or effusion is seen. No bony abnormality is noted. IMPRESSION: Mild CHF stable from the previous exam. Electronically Signed   By: Alcide CleverMark  Lukens M.D.   On: 04/29/2018 22:15    EKG:   Orders placed or performed during the hospital encounter of 04/29/18  . EKG 12-Lead  . EKG 12-Lead  . EKG 12-Lead  . EKG 12-Lead    IMPRESSION AND PLAN:  Principal Problem:   Acute respiratory failure with hypoxia (HCC) -due to COPD and CHF as below.   Patient is currently on BiPAP.  Will admit her to stepdown unit with treatment of problems as below.  Wean BiPAP as tolerated Active Problems:   COPD with acute exacerbation (HCC) -IV steroids, azithromycin, duo nebs, PRN antitussive and other supportive treatment   Acute on chronic systolic CHF (congestive heart failure) (HCC) -IV Lasix given in the ED.  We will continue home meds, and get  a cardiology consult   Type 2 diabetes mellitus (HCC) -sliding scale insulin coverage   HTN (hypertension) -home dose antihypertensives   OSA on CPAP -BiPAP as above   GERD (gastroesophageal reflux disease) -home dose PPI  Chart review performed and case discussed with ED provider. Labs, imaging and/or ECG reviewed by provider and discussed with patient/family. Management plans discussed with the patient and/or family.  DVT PROPHYLAXIS: SubQ lovenox   GI PROPHYLAXIS:  PPI   ADMISSION STATUS: Inpatient     CODE STATUS: Full Code Status History    Date Active Date Inactive Code Status Order ID Comments User Context   04/06/2018 0524 04/09/2018 1904 Full Code [id number]  Arnaldo Natal, MD Inpatient   06/05/2016 2320 06/09/2016 1759 Full Code [id number]  Tonye Royalty, DO ED   05/29/2016 0643 05/30/2016 2000 Full Code [id number]  Arnaldo Natal, MD ED   05/23/2015 2256 05/26/2015 1947 Full Code [id number]  Oralia Manis, MD Inpatient    Advance Directive Documentation     Most Recent Value  Type of Advance Directive  Living will  Pre-existing out of facility DNR order (yellow form or pink MOST form)  -  "MOST" Form in Place?  -      TOTAL CRITICAL CARE TIME TAKING CARE OF THIS PATIENT: 50 minutes.   Ayonna Speranza FIELDING 04/29/2018, 11:43 PM  [facility]  Office  [phone]  CC: Primary care physician; Keane Police, MD  Note:  This document was prepared using Dragon voice recognition software and may include unintentional dictation errors.

## 2018-04-29 NOTE — ED Notes (Signed)
Urine sample collected by NT and sent to lab.

## 2018-04-29 NOTE — ED Triage Notes (Signed)
Pt brought in via EMS on CPAP for SOB. Hist DM, CHF, and COPD. RRT at bedside with EDP. Switching to BiPAP. 125 solu medrol + 2 duonebs + 2 albuterol given by EMS. Wheezing.

## 2018-04-29 NOTE — ED Notes (Signed)
Lab called to confirm all correct tubes needed were sent. Wallace CullensGray top sent.

## 2018-04-29 NOTE — ED Notes (Signed)
Pt will use call bell when she can provide urine sample. Family at bedside.

## 2018-04-29 NOTE — ED Provider Notes (Signed)
[facility] Emergency Department Provider Note ____________________________________________   First MD Initiated Contact with Patient 04/29/18 2136     (approximate)  I have reviewed the triage vital signs and the nursing notes.   HISTORY  Chief Complaint Shortness of Breath  Level 5 caveat: History of present illness limited due to respiratory distress  HPI Alexis Ray is a 62 y.o. female with PMH as noted below including COPD, CHF, and CAD, who presents with shortness of breath, acute onset and associated with cough and wheezing.  Past Medical History:  Diagnosis Date  . CAD (coronary artery disease)   . Chronic combined systolic and diastolic CHF (congestive heart failure) (HCC)   . COPD (chronic obstructive pulmonary disease) (HCC)   . Diabetes mellitus without complication (HCC)   . GERD (gastroesophageal reflux disease)   . Hypertension   . PVD (peripheral vascular disease) (HCC)   . Stroke (cerebrum) [facility])     Patient Active Problem List   Diagnosis Date Noted  . Acute on chronic systolic CHF (congestive heart failure) (HCC) 04/06/2018  . HCAP (healthcare-associated pneumonia) 06/05/2016  . Acute on chronic systolic CHF (congestive heart failure), NYHA class 4 (HCC) 05/29/2016  . Chronic systolic heart failure (HCC) 06/07/2015  . Numbness in both legs 06/07/2015  . Tobacco use 06/07/2015  . GERD (gastroesophageal reflux disease) 05/23/2015  . Type 2 diabetes mellitus (HCC) 05/23/2015  . HTN (hypertension) 05/23/2015  . PVD (peripheral vascular disease) (HCC) 05/23/2015  . OSA on CPAP 05/23/2015    Past Surgical History:  Procedure Laterality Date  . ABDOMINAL HYSTERECTOMY    . CARDIAC CATHETERIZATION    . COLONOSCOPY    . EYE SURGERY Bilateral   . LEFT HEART CATH AND CORONARY ANGIOGRAPHY N/A 04/08/2018   Procedure: LEFT HEART CATH AND CORONARY ANGIOGRAPHY;  Surgeon: Laurier Nancy, MD;  Location: [facility] INVASIVE CV LAB;  Service:  Cardiovascular;  Laterality: N/A;    Prior to Admission medications   Medication Sig Start Date End Date Taking? Authorizing Provider  albuterol (PROVENTIL) (2.5 MG/3ML) 0.083% nebulizer solution Take 3 mLs (2.5 mg total) by nebulization every 6 (six) hours as needed for wheezing or shortness of breath. 05/26/15   Altamese Dilling, MD  albuterol (VENTOLIN HFA) 108 (90 Base) MCG/ACT inhaler Inhale 2 puffs into the lungs every 6 (six) hours as needed for wheezing or shortness of breath.     [provider]  aspirin EC 81 MG tablet Take 81 mg by mouth daily.    [provider]  budesonide-formoterol (SYMBICORT) 160-4.5 MCG/ACT inhaler Inhale 2 puffs into the lungs 2 (two) times daily.    [provider]  carvedilol (COREG) 6.25 MG tablet Take 6.25 mg by mouth 2 (two) times daily with a meal.    [provider]  cholecalciferol (VITAMIN D3) 25 MCG (1000 UT) tablet Take 1,000 Units by mouth daily.    [provider]  cilostazol (PLETAL) 100 MG tablet Take 100 mg by mouth 2 (two) times daily.    [provider]  Dulaglutide 1.5 MG/0.5ML SOPN Inject 1.5 mg into the skin every Monday.    [provider]  esomeprazole (NEXIUM) 40 MG capsule Take 40 mg by mouth daily.     [provider]  ferrous sulfate 325 (65 FE) MG tablet Take 325 mg by mouth daily with breakfast.    [provider]  fluticasone (FLONASE) 50 MCG/ACT nasal spray Place 1 spray into both nostrils 2 (two) times  daily.    [provider]  furosemide (LASIX) 40 MG tablet Take 1 tablet (40 mg total) by mouth daily. 04/09/18   Adrian SaranMody, Sital, MD  insulin aspart (NOVOLOG) 100 UNIT/ML injection Inject 10 Units into the skin 3 (three) times daily with meals.    [provider]  insulin glargine (LANTUS) 100 UNIT/ML injection Inject 45 Units into the skin at bedtime.     [provider]  levocetirizine (XYZAL) 5 MG tablet Take 5 mg by mouth  at bedtime.     [provider]  metFORMIN (GLUCOPHAGE) 500 MG tablet Take 500 mg by mouth 2 (two) times daily with a meal.     [provider]  Multiple Vitamins-Minerals (SENTRY SENIOR) TABS Take 1 tablet by mouth daily.    [provider]  nitroGLYCERIN (NITROSTAT) 0.4 MG SL tablet Place 0.4 mg under the tongue every 5 (five) minutes as needed for chest pain.    [provider]  pramipexole (MIRAPEX) 0.125 MG tablet Take 0.125 mg by mouth at bedtime.     [provider]  pregabalin (LYRICA) 150 MG capsule Take 150 mg by mouth 3 (three) times daily.     [provider]  rosuvastatin (CRESTOR) 40 MG tablet Take 40 mg by mouth daily.    [provider]  sacubitril-valsartan (ENTRESTO) 49-51 MG Take 1 tablet by mouth 2 (two) times daily.    [provider]  spironolactone (ALDACTONE) 25 MG tablet Take 25 mg by mouth daily.     [provider]  Tiotropium Bromide Monohydrate (SPIRIVA RESPIMAT) 2.5 MCG/ACT AERS Inhale 2 puffs into the lungs daily.    [provider]  Venlafaxine HCl 225 MG TB24 Take 225 mg by mouth daily.     [provider]  vitamin C (ASCORBIC ACID) 500 MG tablet Take 500 mg by mouth daily.    [provider]    Allergies Colchicine; Sulfa antibiotics; and Levaquin [levofloxacin in d5w]  Family History  Problem Relation Age of Onset  . Diabetes Mother   . Cancer Father   . Diabetes Maternal Aunt   . Diabetes Maternal Aunt     Social History Social History   Tobacco Use  . Smoking status: Current Every Day Smoker    Packs/day: 1.00    Types: Cigarettes  . Smokeless tobacco: Never Used  Substance Use Topics  . Alcohol use: No    Alcohol/week: 0.0 standard drinks  . Drug use: No    Review of Systems Level 5 caveat: Review of systems limited due to respiratory distress Constitutional: No fever. Cardiovascular: Denies chest pain. Respiratory: Positive for  shortness of breath. Gastrointestinal: No vomiting. Neurological: Negative for headache.   ____________________________________________   PHYSICAL EXAM:  VITAL SIGNS: ED Triage Vitals  Enc Vitals Group     BP 04/29/18 2125 118/82     Pulse Rate 04/29/18 2125 (!) 102     Resp --      Temp --      Temp Source 04/29/18 2125 Axillary     SpO2 --      Weight 04/29/18 2127 198 lb 6.6 oz (90 kg)     Height 04/29/18 2127 5\' 4"  (1.626 m)     Head Circumference --      Peak Flow --      Pain Score 04/29/18 2127 0     Pain Loc --      Pain Edu? --      Excl. in  GC? --     Constitutional: Uncomfortable appearing, acute dyspnea. Eyes: Conjunctivae are normal.  Head: Atraumatic. Nose: No congestion/rhinnorhea. Mouth/Throat: Mucous membranes are somewhat dry.   Neck: Normal range of motion.  Cardiovascular: Tachycardic, regular rhythm. Grossly normal heart sounds.  Good peripheral circulation. Respiratory: Acute respiratory distress with tachypnea.  Diffuse wheezing decreased air entry bilaterally. Gastrointestinal: Soft and nontender. No distention.  Genitourinary: No flank tenderness. Musculoskeletal: No lower extremity edema.  Extremities warm and well perfused.  Neurologic:  Normal speech and language. No gross focal neurologic deficits are appreciated.  Skin:  Skin is warm and dry. No rash noted. Psychiatric: Mood and affect are normal. Speech and behavior are normal.  ____________________________________________   LABS (all labs ordered are listed, but only abnormal results are displayed)  Labs Reviewed  BASIC METABOLIC PANEL - Abnormal; Notable for the following components:      Result Value   Glucose, Bld 249 (*)    Creatinine, Ser 1.41 (*)    GFR calc non Af Amer 40 (*)    GFR calc Af Amer 46 (*)    All other components within normal limits  CBC WITH DIFFERENTIAL/PLATELET - Abnormal; Notable for the following components:   RDW 15.7 (*)    All other components  within normal limits  TROPONIN I - Abnormal; Notable for the following components:   Troponin I 0.19 (*)    All other components within normal limits  BRAIN NATRIURETIC PEPTIDE - Abnormal; Notable for the following components:   B Natriuretic Peptide 4,309.0 (*)    All other components within normal limits  URINALYSIS, COMPLETE (UACMP) WITH MICROSCOPIC - Abnormal; Notable for the following components:   Color, Urine STRAW (*)    APPearance CLEAR (*)    Glucose, UA 50 (*)    Bacteria, UA RARE (*)    All other components within normal limits  LACTIC ACID, PLASMA  BLOOD GAS, VENOUS  INFLUENZA PANEL BY PCR (TYPE A & B)   ____________________________________________  EKG  ED ECG REPORT I, Dionne Bucy, the attending physician, personally viewed and interpreted this ECG.  Date: 04/29/2018 EKG Time: 2120 Rate: 105 Rhythm: Sinus tachycardia with PACs QRS Axis: Borderline right axis Intervals: normal ST/T Wave abnormalities: Nonspecific repolarization abnormality Narrative Interpretation: no evidence of acute ischemia  ____________________________________________  RADIOLOGY  CXR: Stable CHF, no focal infiltrate  ____________________________________________   PROCEDURES  Procedure(s) performed: No  Procedures  Critical Care performed: Yes  CRITICAL CARE Performed by: Dionne Bucy   Total critical care time: 30 minutes  Critical care time was exclusive of separately billable procedures and treating other patients.  Critical care was necessary to treat or prevent imminent or life-threatening deterioration.  Critical care was time spent personally by me on the following activities: development of treatment plan with patient and/or surrogate as well as nursing, discussions with consultants, evaluation of patient's response to treatment, examination of patient, obtaining history from patient or surrogate, ordering and performing treatments and interventions,  ordering and review of laboratory studies, ordering and review of radiographic studies, pulse oximetry and re-evaluation of patient's condition. ____________________________________________   INITIAL IMPRESSION / ASSESSMENT AND PLAN / ED COURSE  Pertinent labs & imaging results that were available during my care of the patient were reviewed by me and considered in my medical decision making (see chart for details).  62 year old female with PMH as noted above presents with acute onset of shortness of breath this evening.  EMS found the patient to have diffuse wheezing  and decreased air entry, and gave Solu-Medrol, bronchodilators, and placed the patient on CPAP.  On ED arrival, the patient continued to be acutely dyspneic and uncomfortable appearing.  On lung exam she was diffusely wheezing bilaterally.  She was borderline tachycardic, with an O2 saturation in the low 90s on CPAP and a normal blood pressure.  The patient was transitioned to BiPAP, given continued bronchodilators and IV magnesium.  I reviewed the past medical records in Epic; the patient was most recently admitted earlier this month with a CHF exacerbation and chest pain at that time.  Based on the patient's wheezing, the acute onset of the symptoms, and the lack of hypertension the presentation is most consistent with COPD rather than CHF.  Differential also includes pneumonia, bronchitis, influenza.  We will obtain chest x-ray, lab work-up, continue bronchodilators, and reassess.  Anticipate admission.  ----------------------------------------- 11:18 PM on 04/29/2018 -----------------------------------------  Patient is much more comfortable and oxygenating well.  Chest x-ray shows CHF unchanged from prior and her BNP is very elevated, so there is certainly a component of CHF today but I think the acute exacerbation was more likely related to COPD.  Influenza is pending.  Her lactate is normal.    I signed the patient out to  the hospitalist Dr. Anne HahnWillis.  ____________________________________________   FINAL CLINICAL IMPRESSION(S) / ED DIAGNOSES  Final diagnoses:  Acute respiratory failure with hypoxia (HCC)  COPD exacerbation (HCC)  Chronic congestive heart failure, unspecified heart failure type (HCC)      NEW MEDICATIONS STARTED DURING THIS VISIT:  New Prescriptions   No medications on file     Note:  This document was prepared using Dragon voice recognition software and may include unintentional dictation errors.    Dionne BucySiadecki, Chanah Tidmore, MD 04/29/18 [phone]

## 2018-04-30 ENCOUNTER — Inpatient Hospital Stay: Payer: Medicare HMO

## 2018-04-30 DIAGNOSIS — I5023 Acute on chronic systolic (congestive) heart failure: Secondary | ICD-10-CM

## 2018-04-30 DIAGNOSIS — J9601 Acute respiratory failure with hypoxia: Secondary | ICD-10-CM

## 2018-04-30 DIAGNOSIS — J441 Chronic obstructive pulmonary disease with (acute) exacerbation: Secondary | ICD-10-CM

## 2018-04-30 LAB — CBC
HCT: 42.1 % (ref 36.0–46.0)
Hemoglobin: 13 g/dL (ref 12.0–15.0)
MCH: 26.7 pg (ref 26.0–34.0)
MCHC: 30.9 g/dL (ref 30.0–36.0)
MCV: 86.4 fL (ref 80.0–100.0)
Platelets: 149 10*3/uL — ABNORMAL LOW (ref 150–400)
RBC: 4.87 MIL/uL (ref 3.87–5.11)
RDW: 15.8 % — ABNORMAL HIGH (ref 11.5–15.5)
WBC: 5.1 10*3/uL (ref 4.0–10.5)
nRBC: 0 % (ref 0.0–0.2)

## 2018-04-30 LAB — BASIC METABOLIC PANEL
ANION GAP: 11 (ref 5–15)
BUN: 21 mg/dL (ref 8–23)
CO2: 25 mmol/L (ref 22–32)
Calcium: 9 mg/dL (ref 8.9–10.3)
Chloride: 104 mmol/L (ref 98–111)
Creatinine, Ser: 1.46 mg/dL — ABNORMAL HIGH (ref 0.44–1.00)
GFR calc Af Amer: 45 mL/min — ABNORMAL LOW (ref >60)
GFR calc non Af Amer: 38 mL/min — ABNORMAL LOW (ref >60)
Glucose, Bld: 245 mg/dL — ABNORMAL HIGH (ref 70–99)
Potassium: 3.8 mmol/L (ref 3.5–5.1)
Sodium: 140 mmol/L (ref 135–145)

## 2018-04-30 LAB — GLUCOSE, CAPILLARY
GLUCOSE-CAPILLARY: 195 mg/dL — AB (ref 70–99)
GLUCOSE-CAPILLARY: 224 mg/dL — AB (ref 70–99)
Glucose-Capillary: 233 mg/dL — ABNORMAL HIGH (ref 70–99)
Glucose-Capillary: 226 mg/dL — ABNORMAL HIGH (ref 70–99)
Glucose-Capillary: 187 mg/dL — ABNORMAL HIGH (ref 70–99)
Glucose-Capillary: 217 mg/dL — ABNORMAL HIGH (ref 70–99)
Glucose-Capillary: 188 mg/dL — ABNORMAL HIGH (ref 70–99)

## 2018-04-30 LAB — MAGNESIUM: Magnesium: 2.4 mg/dL (ref 1.7–2.4)

## 2018-04-30 LAB — MRSA PCR SCREENING: MRSA by PCR: POSITIVE — AB

## 2018-04-30 LAB — BRAIN NATRIURETIC PEPTIDE: B Natriuretic Peptide: >4500 pg/mL — ABNORMAL HIGH (ref 0.0–100.0)

## 2018-04-30 MED ORDER — CHLORHEXIDINE GLUCONATE CLOTH 2 % EX PADS
6.0000 | MEDICATED_PAD | Freq: Every day | CUTANEOUS | Status: DC
  Administered 2018-05-02: 6 via TOPICAL

## 2018-04-30 MED ORDER — AZITHROMYCIN 500 MG PO TABS
500.0000 mg | ORAL_TABLET | Freq: Every day | ORAL | Status: AC
  Administered 2018-04-30 – 2018-05-02 (×3): 500 mg via ORAL
  Filled 2018-04-30: qty 2
  Filled 2018-04-30 (×2): qty 1
  Filled 2018-04-30: qty 2
  Filled 2018-04-30: qty 1

## 2018-04-30 MED ORDER — DOCUSATE SODIUM 100 MG PO CAPS
100.0000 mg | ORAL_CAPSULE | Freq: Two times a day (BID) | ORAL | Status: DC
  Administered 2018-04-30 – 2018-05-02 (×4): 100 mg via ORAL
  Filled 2018-04-30 (×5): qty 1

## 2018-04-30 MED ORDER — ORAL CARE MOUTH RINSE
15.0000 mL | Freq: Two times a day (BID) | OROMUCOSAL | Status: DC
  Administered 2018-04-30 – 2018-05-01 (×3): 15 mL via OROMUCOSAL

## 2018-04-30 MED ORDER — FUROSEMIDE 10 MG/ML IJ SOLN
40.0000 mg | Freq: Two times a day (BID) | INTRAMUSCULAR | Status: DC
  Administered 2018-04-30: 40 mg via INTRAVENOUS
  Filled 2018-04-30: qty 4

## 2018-04-30 MED ORDER — CARVEDILOL 6.25 MG PO TABS
6.2500 mg | ORAL_TABLET | Freq: Two times a day (BID) | ORAL | Status: DC
  Administered 2018-04-30 – 2018-05-02 (×4): 6.25 mg via ORAL
  Filled 2018-04-30 (×4): qty 1

## 2018-04-30 MED ORDER — INSULIN GLARGINE 100 UNIT/ML ~~LOC~~ SOLN
20.0000 [IU] | Freq: Every day | SUBCUTANEOUS | Status: DC
  Administered 2018-04-30 – 2018-05-02 (×3): 20 [IU] via SUBCUTANEOUS
  Filled 2018-04-30 (×5): qty 0.2

## 2018-04-30 MED ORDER — ENOXAPARIN SODIUM 40 MG/0.4ML ~~LOC~~ SOLN
40.0000 mg | SUBCUTANEOUS | Status: DC
  Administered 2018-04-30 – 2018-05-01 (×2): 40 mg via SUBCUTANEOUS
  Filled 2018-04-30 (×2): qty 0.4

## 2018-04-30 MED ORDER — ONDANSETRON HCL 4 MG PO TABS: 4.0000 mg | ORAL_TABLET | Freq: Four times a day (QID) | ORAL | Status: DC | PRN

## 2018-04-30 MED ORDER — METHYLPREDNISOLONE SODIUM SUCC 125 MG IJ SOLR: 60.0000 mg | Freq: Two times a day (BID) | INTRAMUSCULAR | Status: DC

## 2018-04-30 MED ORDER — ENOXAPARIN SODIUM 40 MG/0.4ML ~~LOC~~ SOLN: 40.0000 mg | SUBCUTANEOUS | Status: DC

## 2018-04-30 MED ORDER — MUPIROCIN 2 % EX OINT
1.0000 "application " | TOPICAL_OINTMENT | Freq: Two times a day (BID) | CUTANEOUS | Status: DC
  Administered 2018-04-30 – 2018-05-02 (×5): 1 via NASAL
  Filled 2018-04-30: qty 22

## 2018-04-30 MED ORDER — ONDANSETRON HCL 4 MG/2ML IJ SOLN: 4.0000 mg | Freq: Four times a day (QID) | INTRAMUSCULAR | Status: DC | PRN

## 2018-04-30 MED ORDER — PRAMIPEXOLE DIHYDROCHLORIDE 0.25 MG PO TABS
0.1250 mg | ORAL_TABLET | Freq: Every day | ORAL | Status: DC
  Administered 2018-04-30 – 2018-05-01 (×2): 0.125 mg via ORAL
  Filled 2018-04-30 (×2): qty 1

## 2018-04-30 MED ORDER — MORPHINE SULFATE (PF) 2 MG/ML IV SOLN
2.0000 mg | Freq: Once | INTRAVENOUS | Status: AC
  Administered 2018-04-30: 2 mg via INTRAVENOUS
  Filled 2018-04-30: qty 1

## 2018-04-30 MED ORDER — GUAIFENESIN-DM 100-10 MG/5ML PO SYRP
5.0000 mL | ORAL_SOLUTION | ORAL | Status: DC | PRN
  Administered 2018-04-30 – 2018-05-02 (×2): 5 mL via ORAL
  Filled 2018-04-30 (×2): qty 5

## 2018-04-30 MED ORDER — METHYLPREDNISOLONE SODIUM SUCC 125 MG IJ SOLR
60.0000 mg | Freq: Two times a day (BID) | INTRAMUSCULAR | Status: DC
  Administered 2018-04-30 – 2018-05-02 (×4): 60 mg via INTRAVENOUS
  Filled 2018-04-30 (×4): qty 2

## 2018-04-30 MED ORDER — FUROSEMIDE 40 MG PO TABS: 40.0000 mg | ORAL_TABLET | Freq: Every day | ORAL | Status: DC

## 2018-04-30 MED ORDER — SACUBITRIL-VALSARTAN 49-51 MG PO TABS
1.0000 | ORAL_TABLET | Freq: Two times a day (BID) | ORAL | Status: DC
  Administered 2018-04-30 – 2018-05-02 (×4): 1 via ORAL
  Filled 2018-04-30 (×7): qty 1

## 2018-04-30 MED ORDER — IPRATROPIUM-ALBUTEROL 0.5-2.5 (3) MG/3ML IN SOLN: 3.0000 mL | RESPIRATORY_TRACT | Status: DC | PRN

## 2018-04-30 MED ORDER — ACETAMINOPHEN 650 MG RE SUPP: 650.0000 mg | Freq: Four times a day (QID) | RECTAL | Status: DC | PRN

## 2018-04-30 MED ORDER — CILOSTAZOL 100 MG PO TABS
100.0000 mg | ORAL_TABLET | Freq: Two times a day (BID) | ORAL | Status: DC
  Administered 2018-04-30 – 2018-05-02 (×5): 100 mg via ORAL
  Filled 2018-04-30 (×6): qty 1

## 2018-04-30 MED ORDER — CHLORHEXIDINE GLUCONATE 0.12 % MT SOLN
15.0000 mL | Freq: Two times a day (BID) | OROMUCOSAL | Status: DC
  Administered 2018-04-30 – 2018-05-02 (×6): 15 mL via OROMUCOSAL
  Filled 2018-04-30 (×5): qty 15

## 2018-04-30 MED ORDER — PREGABALIN 75 MG PO CAPS: 150.0000 mg | ORAL_CAPSULE | Freq: Three times a day (TID) | ORAL | Status: DC

## 2018-04-30 MED ORDER — SPIRONOLACTONE 25 MG PO TABS
25.0000 mg | ORAL_TABLET | Freq: Every day | ORAL | Status: DC
  Administered 2018-04-30 – 2018-05-02 (×3): 25 mg via ORAL
  Filled 2018-04-30 (×3): qty 1

## 2018-04-30 MED ORDER — ACETAMINOPHEN 325 MG PO TABS
650.0000 mg | ORAL_TABLET | Freq: Four times a day (QID) | ORAL | Status: DC | PRN
  Administered 2018-04-30 – 2018-05-02 (×3): 650 mg via ORAL
  Filled 2018-04-30 (×3): qty 2

## 2018-04-30 MED ORDER — POLYETHYLENE GLYCOL 3350 17 G PO PACK: 17.0000 g | PACK | Freq: Every day | ORAL | Status: DC | PRN

## 2018-04-30 MED ORDER — INSULIN ASPART 100 UNIT/ML ~~LOC~~ SOLN
0.0000 [IU] | SUBCUTANEOUS | Status: DC
  Administered 2018-04-30 (×3): 3 [IU] via SUBCUTANEOUS
  Administered 2018-04-30 (×2): 2 [IU] via SUBCUTANEOUS
  Administered 2018-04-30: 3 [IU] via SUBCUTANEOUS
  Filled 2018-04-30 (×6): qty 1

## 2018-04-30 MED ORDER — METHYLPREDNISOLONE SODIUM SUCC 125 MG IJ SOLR
60.0000 mg | Freq: Four times a day (QID) | INTRAMUSCULAR | Status: DC
  Administered 2018-04-30 (×2): 60 mg via INTRAVENOUS
  Filled 2018-04-30 (×2): qty 2

## 2018-04-30 MED ORDER — IPRATROPIUM-ALBUTEROL 0.5-2.5 (3) MG/3ML IN SOLN
3.0000 mL | Freq: Four times a day (QID) | RESPIRATORY_TRACT | Status: DC
  Administered 2018-04-30 – 2018-05-02 (×10): 3 mL via RESPIRATORY_TRACT
  Filled 2018-04-30 (×10): qty 3

## 2018-04-30 MED ORDER — POTASSIUM CHLORIDE CRYS ER 20 MEQ PO TBCR
20.0000 meq | EXTENDED_RELEASE_TABLET | Freq: Two times a day (BID) | ORAL | Status: DC
  Administered 2018-04-30 (×2): 20 meq via ORAL
  Filled 2018-04-30 (×2): qty 1

## 2018-04-30 MED ORDER — VENLAFAXINE HCL ER 75 MG PO CP24
225.0000 mg | ORAL_CAPSULE | Freq: Every day | ORAL | Status: DC
  Administered 2018-04-30 – 2018-05-02 (×3): 225 mg via ORAL
  Filled 2018-04-30 (×4): qty 3

## 2018-04-30 MED ORDER — ASPIRIN EC 81 MG PO TBEC
81.0000 mg | DELAYED_RELEASE_TABLET | Freq: Every day | ORAL | Status: DC
  Administered 2018-04-30 – 2018-05-02 (×3): 81 mg via ORAL
  Filled 2018-04-30 (×2): qty 1

## 2018-04-30 MED ORDER — BUDESONIDE 0.25 MG/2ML IN SUSP
0.2500 mg | Freq: Two times a day (BID) | RESPIRATORY_TRACT | Status: DC
  Administered 2018-04-30 – 2018-05-02 (×5): 0.25 mg via RESPIRATORY_TRACT
  Filled 2018-04-30 (×5): qty 2

## 2018-04-30 MED ORDER — PREGABALIN 75 MG PO CAPS
150.0000 mg | ORAL_CAPSULE | Freq: Three times a day (TID) | ORAL | Status: DC
  Administered 2018-04-30 – 2018-05-02 (×6): 150 mg via ORAL
  Filled 2018-04-30 (×6): qty 2

## 2018-04-30 MED ORDER — ROSUVASTATIN CALCIUM 10 MG PO TABS
40.0000 mg | ORAL_TABLET | Freq: Every day | ORAL | Status: DC
  Administered 2018-04-30 – 2018-05-02 (×3): 40 mg via ORAL
  Filled 2018-04-30 (×3): qty 4

## 2018-04-30 NOTE — ED Notes (Signed)
Sheralyn Boatmanoni RN will speak with Charge RN to decide on appropriate room to assign pt to. Will call back.

## 2018-04-30 NOTE — ED Notes (Signed)
RRT at bedside to assist with transport. Sheralyn Boatmanoni RN notified pt will be up soon.

## 2018-04-30 NOTE — Progress Notes (Signed)
[employer] [facility] [place] [facility]   PATIENT NAME: Alexis GaribaldiDebra Ray    MR#:  [id number]  DATE OF BIRTH:  [birth year]  SUBJECTIVE:  CHIEF COMPLAINT:   Chief Complaint  Patient presents with  . Shortness of Breath   -Admitted for COPD exacerbation from home.  Remains on BiPAP this morning.  Feels much better  REVIEW OF SYSTEMS:  Review of Systems  Constitutional: Negative for chills, fever and malaise/fatigue.  HENT: Negative for congestion, ear discharge, hearing loss and nosebleeds.   Respiratory: Positive for cough and shortness of breath. Negative for wheezing.   Cardiovascular: Negative for chest pain, palpitations and leg swelling.  Gastrointestinal: Negative for abdominal pain, constipation, diarrhea, nausea and vomiting.  Genitourinary: Negative for dysuria.  Neurological: Negative for dizziness, focal weakness, seizures, weakness and headaches.  Psychiatric/Behavioral: Negative for depression.    DRUG ALLERGIES:   Allergies  Allergen Reactions  . Colchicine     itching  . Sulfa Antibiotics Rash    itching  . Levaquin [Levofloxacin In D5w] Rash    VITALS:  Blood pressure 134/85, pulse (!) 108, temperature (!) 97 F (36.1 C), temperature source Axillary, resp. rate (!) 24, height 5\' 5"  (1.651 m), weight 86.4 kg, SpO2 99 %.  PHYSICAL EXAMINATION:  Physical Exam   GENERAL:  62 y.o.-year-old patient lying in the bed with no acute distress.  EYES: Pupils equal, round, reactive to light and accommodation. No scleral icterus. Extraocular muscles intact.  HEENT: Head atraumatic, normocephalic. Oropharynx and nasopharynx clear.  NECK:  Supple, no jugular venous distention. No thyroid enlargement, no tenderness.  LUNGS: Scant breath sounds bilaterally, occasional scattered wheezing, no rales,rhonchi or crepitation. No use of accessory muscles of respiration.  CARDIOVASCULAR: S1, S2 normal. No murmurs, rubs, or gallops.  ABDOMEN: Soft, nontender,  nondistended. Bowel sounds present. No organomegaly or mass.  EXTREMITIES: No pedal edema, cyanosis, or clubbing.  NEUROLOGIC: Cranial nerves II through XII are intact. Muscle strength 5/5 in all extremities. Sensation intact. Gait not checked.  PSYCHIATRIC: The patient is alert and oriented x 3.  SKIN: No obvious rash, lesion, or ulcer.    LABORATORY PANEL:   CBC Recent Labs  Lab 04/30/18 0500  WBC 5.1  HGB 13.0  HCT 42.1  PLT 149*   ------------------------------------------------------------------------------------------------------------------  Chemistries  Recent Labs  Lab 04/30/18 0500  NA 140  K 3.8  CL 104  CO2 25  GLUCOSE 245*  BUN 21  CREATININE 1.46*  CALCIUM 9.0   ------------------------------------------------------------------------------------------------------------------  Cardiac Enzymes Recent Labs  Lab 04/29/18 2128  TROPONINI 0.19*   ------------------------------------------------------------------------------------------------------------------  RADIOLOGY:  Dg Chest Port 1 View  Result Date: 04/30/2018 CLINICAL DATA:  Acute onset of respiratory failure. EXAM: PORTABLE CHEST 1 VIEW COMPARISON:  Chest radiograph performed 04/29/2018 FINDINGS: The lungs are well-aerated. Vascular congestion is noted. Bibasilar airspace opacities raise concern for pulmonary edema. Small bilateral pleural effusions are noted. No pneumothorax is seen. The cardiomediastinal silhouette is mildly enlarged. No acute osseous abnormalities are seen. IMPRESSION: Vascular congestion and mild cardiomegaly. Bibasilar airspace opacities raise concern for pulmonary edema. Small bilateral pleural effusions noted. Electronically Signed   By: Roanna RaiderJeffery  Chang M.D.   On: 04/30/2018 05:27   Dg Chest Portable 1 View  Result Date: 04/29/2018 CLINICAL DATA:  Shortness of breath EXAM: PORTABLE CHEST 1 VIEW COMPARISON:  04/08/2018 FINDINGS: Cardiac shadow remains enlarged. Increased  vascular congestion is noted with interstitial edema similar to that seen on the prior exam. No focal infiltrate or effusion is  seen. No bony abnormality is noted. IMPRESSION: Mild CHF stable from the previous exam. Electronically Signed   By: Alcide CleverMark  Lukens M.D.   On: 04/29/2018 22:15    EKG:   Orders placed or performed during the hospital encounter of 04/29/18  . EKG 12-Lead  . EKG 12-Lead  . EKG 12-Lead  . EKG 12-Lead    ASSESSMENT AND PLAN:   62 year old female with past medical history significant for COPD not on home oxygen, CAD, combined heart failure, GERD, hypertension and history of stroke presents from home secondary to worsening shortness of breath.  1.  Acute hypoxic respiratory failure-secondary to COPD and CHF exacerbation -Chest x-ray consistent with vascular congestion -Admitted to ICU on BiPAP. -Received a high dose of IV Lasix in the ED .  Also on IV steroids and azithromycin.  No evidence of pneumonia noted. -Management per the intensivist  2.  Acute on chronic CHF-echocardiogram is pending.  Lasix dose given  last night -Change to oral Lasix when improving -Restart Entresto, Coreg, Aldactone but blood pressure is improving  3.  CAD-restart home meds.  Patient on aspirin, Coreg, Entresto, Aldactone and statin  4.  Diabetes mellitus-once diet is started, restart her Lantus.  Currently only on sliding scale insulin.  5.  Depression-on Effexor-need to be restarted  6.  PAD-restart Pletal when able to take oral meds  7.  DVT prophylaxis-currently on Lovenox  Once off the BiPAP, will consult physical therapy    All the records are reviewed and case discussed with Care Management/Social Workerr. Management plans discussed with the patient, family and they are in agreement.  CODE STATUS: Full code  TOTAL TIME TAKING CARE OF THIS PATIENT: 37 minutes.   POSSIBLE D/C IN 2-3 DAYS, DEPENDING ON CLINICAL CONDITION.   Enid Baasadhika Otelia Hettinger M.D on 04/30/2018 at 9:07  AM  Between 7am to 6pm - Pager - [phone]  After 6pm go to [email or web address] - Social research officer, governmentpassword [email or web address] [email or web address]  [facility]  Office  [phone]  CC: Primary care physician; Keane PoliceNoorani, Sezmin S, MD

## 2018-04-30 NOTE — Progress Notes (Addendum)
Inpatient Diabetes Program Recommendations  AACE/ADA: New Consensus Statement on Inpatient Glycemic Control (2015)  Target Ranges:  Prepandial:   less than 140 mg/dL      Peak postprandial:   less than 180 mg/dL (1-2 hours)      Critically ill patients:  140 - 180 mg/dL   Results for [name], Kayliah W (MRN [id number]) as of 04/30/2018 10:14  Ref. Range 04/30/2018 01:00 04/30/2018 03:16 04/30/2018 09:29  Glucose-Capillary Latest Ref Range: 70 - 99 mg/dL 657195 (H)  2 units NOVOLOG  233 (H)  3 units NOVOLOG  226 (H)  3 units NOVOLOG     Admit with: Acute respiratory failure with hypoxia due to COPD and CHF   History: DM, CHF, COPD  Home DM Meds: Trulicity 1.5 mg Qweek       Novolog 10 units TID       Lantus 45 units QHS       Metformin 500 mg BID  Current Orders: Novolog Sensitive Correction Scale/ SSI (0-9 units) Q4 hours      Currently on Bipap.  Novolog SSI started at 1am today.  Started Solumedrol 60 mg Q6 hours at 1am as well.    MD- Note patient takes Lantus at home.  Currently getting steroids as well.  Please consider starting Lantus 20 units QHS (50% home dose to start)      --Will follow patient during hospitalization--  Ambrose FinlandJeannine Johnston Phelicia Dantes RN, MSN, CDE Diabetes Coordinator Inpatient Glycemic Control Team Team Pager: [phone] (8a-5p)

## 2018-04-30 NOTE — ED Notes (Signed)
2nd grn top sent to lab for Trop.

## 2018-04-30 NOTE — Progress Notes (Signed)
Patient alert and oriented. No complaints of pain. On 4 liters. No shortness of breath. Tolerating diet. Adequate urine output. Patient being transferred to floor. Report given to [facility].

## 2018-04-30 NOTE — Progress Notes (Signed)
Pharmacy Electrolyte Monitoring Consult:  Pharmacy consulted to assist in monitoring and replacing electrolytes in this 62 y.o. female admitted on 04/29/2018 with CHF exacerbation.   Labs:  Sodium (mmol/L)  Date Value  04/30/2018 140  12/11/2013 136   Potassium (mmol/L)  Date Value  04/30/2018 3.8  04/10/2014 3.8   Magnesium (mg/dL)  Date Value  96/04/540912/27/2019 2.4  12/04/2011 1.8   Calcium (mg/dL)  Date Value  81/19/147812/27/2019 9.0   Calcium, Total (mg/dL)  Date Value  29/56/213008/01/2014 8.5   Albumin (g/dL)  Date Value  86/57/846912/06/2017 3.6  12/11/2013 3.2 (L)    Assessment/Plan: Patient received furosemide 80mg  IV x 1 and magnesium 2g IV overnight. Patient initiated on furosemide 40mg  IV Q12hr during AM ICU rounds. Will order potassium 20mEq PO BID.   Goal potassium ~ 4, goal magnesium ~ 2.   Will recheck electrolytes with am labs. Electrolytes should be replaced orally if possible.   Pharmacy will continue to monitor and adjust per consult.   Shameer Molstad L 04/30/2018 1:12 PM

## 2018-04-30 NOTE — Progress Notes (Signed)
Initial Nutrition Assessment  DOCUMENTATION CODES:   Obesity unspecified  INTERVENTION:  Encouraged adequate intake of protein at meals. Patient does not want an oral nutrition supplement at this time. RD will continue to monitor.  NUTRITION DIAGNOSIS:   Increased nutrient needs related to catabolic illness(COPD, CHF) as evidenced by estimated needs.  GOAL:   Patient will meet greater than or equal to 90% of their needs  MONITOR:   PO intake, Labs, Weight trends, I & O's  REASON FOR ASSESSMENT:   Malnutrition Screening Tool    ASSESSMENT:   62 year old female with PMHx of DM, HTN, depression, chronic combined systolic and diastolic CHF, hx CVA, COPD, GERD, PVD, CAD admitted with acute hypoxic respiratory failure secondary to acute exacerbation of COPD and CHF exacerbation.   -Per echo 04/07/2018 LV EF was 15%. Having another echo today.  Met with patient at bedside. She was also seen by an RD during a recent admission. Patient reports that for a few days PTA she had a decreased appetite related to difficulty breathing. She reports her appetite feels better today and she is ready to start eating. She was ordered for Glucerna last admission. She reports she does not want any oral nutrition supplements right now. She wants to try to meet her calorie and protein needs through food. Encouraged adequate protein intake at meals.  UBW around 190 lbs. Patient denies any weight loss. Weight does fluctuate up with fluid occasionally.  Medications reviewed and include: azithromycin, Lasix 40 mg Q12hrs IV, Novolog 0-9 units Q4hrs, Lantus 20 units daily, Solu-Medrol 60 mg Q12hrs IV, potassium chloride 20 mEq BID, spironolactone, magnesium sulfate 2 grams IV once today.  Labs reviewed: CBG 195-233, Creatinine 1.46, eGFR 45, BNP >4500.  Patient does not meet criteria for malnutrition.  Discussed with RN and on rounds.  NUTRITION - FOCUSED PHYSICAL EXAM:    Most Recent Value  Orbital  Region  No depletion  Upper Arm Region  No depletion  Thoracic and Lumbar Region  No depletion  Buccal Region  No depletion  Temple Region  No depletion  Clavicle Bone Region  No depletion  Clavicle and Acromion Bone Region  No depletion  Scapular Bone Region  No depletion  Dorsal Hand  No depletion  Patellar Region  No depletion  Anterior Thigh Region  No depletion  Posterior Calf Region  No depletion  Edema (RD Assessment)  None  Hair  Reviewed  Eyes  Reviewed  Mouth  Reviewed  Skin  Reviewed  Nails  Reviewed     Diet Order:   Diet Order            Diet Carb Modified Fluid consistency: Thin; Room service appropriate? Yes  Diet effective now             EDUCATION NEEDS:   Education needs have been addressed  Skin:  Skin Assessment: Reviewed RN Assessment  Last BM:  04/29/2018  Height:   Ht Readings from Last 1 Encounters:  04/30/18 '5\' 5"'$  (1.651 m)   Weight:   Wt Readings from Last 1 Encounters:  04/30/18 86.4 kg   Ideal Body Weight:  56.8 kg  BMI:  Body mass index is 31.7 kg/m.  Estimated Nutritional Needs:   Kcal:  1700-1900  Protein:  85-95 grams  Fluid:  1.4 L/day  Willey Blade, MS, RD, LDN Office: [phone] Pager: [phone] After Hours/Weekend Pager: [phone]

## 2018-04-30 NOTE — Consult Note (Signed)
Name: Alexis Ray MRN: [id number] DOB: [birth year]    ADMISSION DATE:  04/29/2018 CONSULTATION DATE:  04/30/18  REFERRING MD :  Dr. Anne HahnWillis  CHIEF COMPLAINT:  Shortness of Breath  BRIEF PATIENT DESCRIPTION:  62 y.o. Female admitted with Acute Hypoxic Respiratory Failure requiring BiPAP in the setting of Acute COPD and Acute CHF exacerbations  SIGNIFICANT EVENTS  04/30/18>> [facility]  STUDIES:  Echocardiogram 12/27>>  CULTURES: Influenza PCR 12/26>> Negative Sputum 12/27>>  ANTIBIOTICS: Azithromycin 12/26>>  HISTORY OF PRESENT ILLNESS:   Alexis Ray is a 62 y.o. Female with a PMH of  COPD, CHF (combined systolic and diastolic), CAD, PVD, CVA, DM, and CKD III who [facility] on 04/29/18 with c/o progressive shortness of breath and intermittent cough productive of yellow sputum.  She reports wheezing today on 12/26. She denies fever, chills, hemoptysis, or sick contacts.  She does report bilateral LE edema.  Upon arrival to ED, pt with respiratory distress, therefore she was subsequently placed on BiPAP.  Initial workup in the ED revealed BNP 4309, Troponin 0.19, Lactic acid 1.7, WBC 8.5, Creatinine 1.41.  CXR was concerning for increased vascular congestion with interstitial edema.  [facility] is being [facility] for treatment of Acute Hypoxic Respiratory Failure requiring BiPAP in the setting of Acute COPD and Acute CHF exacerbations.  PCCM is consulted for further management.  PAST MEDICAL HISTORY :   has a past medical history of CAD (coronary artery disease), Chronic combined systolic and diastolic CHF (congestive heart failure) (HCC), COPD (chronic obstructive pulmonary disease) (HCC), Diabetes mellitus without complication (HCC), GERD (gastroesophageal reflux disease), Hypertension, PVD (peripheral vascular disease) (HCC), and Stroke (cerebrum) (HCC).  has a past surgical history that includes Abdominal hysterectomy; Colonoscopy; Cardiac  catheterization; Eye surgery (Bilateral); and LEFT HEART CATH AND CORONARY ANGIOGRAPHY (N/A, 04/08/2018). Prior to Admission medications   Medication Sig Start Date End Date Taking? Authorizing Provider  albuterol (PROVENTIL) (2.5 MG/3ML) 0.083% nebulizer solution Take 3 mLs (2.5 mg total) by nebulization every 6 (six) hours as needed for wheezing or shortness of breath. 05/26/15   Altamese DillingVachhani, Vaibhavkumar, MD  albuterol (VENTOLIN HFA) 108 (90 Base) MCG/ACT inhaler Inhale 2 puffs into the lungs every 6 (six) hours as needed for wheezing or shortness of breath.     [provider]  aspirin EC 81 MG tablet Take 81 mg by mouth daily.    [provider]  budesonide-formoterol (SYMBICORT) 160-4.5 MCG/ACT inhaler Inhale 2 puffs into the lungs 2 (two) times daily.    [provider]  carvedilol (COREG) 6.25 MG tablet Take 6.25 mg by mouth 2 (two) times daily with a meal.    [provider]  cholecalciferol (VITAMIN D3) 25 MCG (1000 UT) tablet Take 1,000 Units by mouth daily.    [provider]  cilostazol (PLETAL) 100 MG tablet Take 100 mg by mouth 2 (two) times daily.    [provider]  Dulaglutide 1.5 MG/0.5ML SOPN Inject 1.5 mg into the skin every Monday.    [provider]  esomeprazole (NEXIUM) 40 MG capsule Take 40 mg by mouth daily.     [provider]  ferrous sulfate 325 (65 FE) MG tablet Take 325 mg by mouth daily with breakfast.    [provider]  fluticasone (FLONASE) 50 MCG/ACT nasal spray Place 1 spray into both nostrils 2 (two) times daily.    [provider]  furosemide (LASIX) 40 MG tablet Take 1 tablet (40 mg  total) by mouth daily. 04/09/18   Adrian Saran, MD  insulin aspart (NOVOLOG) 100 UNIT/ML injection Inject 10 Units into the skin 3 (three) times daily with meals.    [provider]  insulin glargine (LANTUS) 100 UNIT/ML injection Inject 45 Units into the skin at bedtime.     [provider]  levocetirizine (XYZAL) 5 MG tablet Take 5 mg by mouth at bedtime.     [provider]  metFORMIN (GLUCOPHAGE) 500 MG tablet Take 500 mg by mouth 2 (two) times daily with a meal.     [provider]  Multiple Vitamins-Minerals (SENTRY SENIOR) TABS Take 1 tablet by mouth daily.    [provider]  nitroGLYCERIN (NITROSTAT) 0.4 MG SL tablet Place 0.4 mg under the tongue every 5 (five) minutes as needed for chest pain.    [provider]  pramipexole (MIRAPEX) 0.125 MG tablet Take 0.125 mg by mouth at bedtime.     [provider]  pregabalin (LYRICA) 150 MG capsule Take 150 mg by mouth 3 (three) times daily.     [provider]  rosuvastatin (CRESTOR) 40 MG tablet Take 40 mg by mouth daily.    [provider]  sacubitril-valsartan (ENTRESTO) 49-51 MG Take 1 tablet by mouth 2 (two) times daily.    [provider]  spironolactone (ALDACTONE) 25 MG tablet Take 25 mg by mouth daily.     [provider]  Tiotropium Bromide Monohydrate (SPIRIVA RESPIMAT) 2.5 MCG/ACT AERS Inhale 2 puffs into the lungs daily.    [provider]  Venlafaxine HCl 225 MG TB24 Take 225 mg by mouth daily.     [provider]  vitamin C (ASCORBIC ACID) 500 MG tablet Take 500 mg by mouth daily.    [provider]   Allergies  Allergen Reactions  . Colchicine     itching  . Sulfa Antibiotics Rash    itching  . Levaquin [Levofloxacin In D5w] Rash    FAMILY HISTORY:  family history includes Cancer in her father; Diabetes in her maternal aunt, maternal aunt, and mother. SOCIAL HISTORY:  reports that she has been smoking cigarettes. She has been smoking about 1.00 pack per day. She has never used smokeless tobacco. She reports that she does not drink alcohol or use drugs.  REVIEW OF SYSTEMS:  Positives in BOLD Constitutional: Negative for fever, chills, weight loss, malaise/fatigue and diaphoresis.  HENT:  Negative for hearing loss, ear pain, nosebleeds, congestion, sore throat, neck pain, tinnitus and ear discharge.   Eyes: Negative for blurred vision, double vision, photophobia, pain, discharge and redness.  Respiratory: Negative for +cough, hemoptysis, +sputum production, shortness of breath, wheezing and stridor.   Cardiovascular: Negative for chest pain, palpitations, orthopnea, claudication, +leg swelling and PND.  Gastrointestinal: Negative for heartburn, nausea, vomiting, abdominal pain, diarrhea, constipation, blood in stool and melena.  Genitourinary: Negative for dysuria, urgency, frequency, hematuria and flank pain.  Musculoskeletal: Negative for myalgias, back pain, joint pain and falls.  Skin: Negative for itching and rash.  Neurological: Negative for dizziness, tingling, tremors, sensory change, speech change, focal weakness, seizures, loss of consciousness, weakness and headaches.  Endo/Heme/Allergies: Negative for environmental allergies and polydipsia. Does not bruise/bleed easily.  SUBJECTIVE:  She reports her shortness of breath has greatly improved Reports intermittent cough, productive of yellow colored sputum Denies wheezing, fever, chills, hemoptysis On BiPAP  VITAL SIGNS: Temp:  [97 F (36.1 C)] 97 F (36.1 C) (12/27 0316) Pulse Rate:  [93-108] 107 (  12/27 0455) Resp:  [18-33] 22 (12/27 0455) BP: (101-153)/(46-142) 153/142 (12/27 0400) SpO2:  [92 %-100 %] 97 % (12/27 0455) FiO2 (%):  [28 %] 28 % (12/27 0326) Weight:  [86.4 kg-90 kg] 86.4 kg (12/27 0331)  PHYSICAL EXAMINATION: General:  Acute on chronically ill appearing female, sitting in bed, on BiPAP, in NAD Neuro:  Awake, A&O, follows commands, no focal deficits, speech clear HEENT:  Atraumatic, normocephalic, neck supple, no JVD Cardiovascular:  RRR, s1s2, no M/R/G, 2+ radial pulses, 1+ pedal pulses bilaterally Lungs:  Coarse breath sounds bilaterally, even, nonlabored, normal effort, BiPAP  assisted Abdomen:  Obese, soft, nontender, nondistended, no rebound tenderness or guarding, BS+ x4 Musculoskeletal:  Normal bulk and tone, no deformities.  2+ edema bilateral feet Skin:  Warm/dry.  No obvious rashes, lesions, or ulcerations  Recent Labs  Lab 04/29/18 2128  NA 138  K 4.3  CL 103  CO2 28  BUN 18  CREATININE 1.41*  GLUCOSE 249*   Recent Labs  Lab 04/29/18 2128 04/30/18 0500  HGB 12.2 13.0  HCT 40.3 42.1  WBC 8.5 5.1  PLT 161 149*   Dg Chest Portable 1 View  Result Date: 04/29/2018 CLINICAL DATA:  Shortness of breath EXAM: PORTABLE CHEST 1 VIEW COMPARISON:  04/08/2018 FINDINGS: Cardiac shadow remains enlarged. Increased vascular congestion is noted with interstitial edema similar to that seen on the prior exam. No focal infiltrate or effusion is seen. No bony abnormality is noted. IMPRESSION: Mild CHF stable from the previous exam. Electronically Signed   By: Alcide CleverMark  Lukens M.D.   On: 04/29/2018 22:15    ASSESSMENT / PLAN:  Acute Hypoxic Respiratory Failure in setting of COPD and CHF exacerbations Hx: COPD, OSA -Supplemental O2 as needed to maintain O2 sats 88 to 94% -BiPAP, wean as tolerated -Follow intermittent CXR and ABG as needed -Bronchodilators -Budesonide nebs -IV Solu-Medrol -Continue Rocephin -Received 80 mg IV Lasix x1 dose -Trend BNP  Acute Decompensated CHF -BNP 4309> Mildly Elevated Troponin, likely demand ischemia Hx: CAD, CHF, HTN -Cardiac monitoring -Trend Troponin -Cardiology consulted, appreciate input -Received Lasix 80 mg IV x1 dose -Follow BNP -Obtain Echocardiogram  Chronic Kidney Disease Stage III (Baseline Creatinine approx. 1.3) -Monitor I&O's / urinary output -Follow BMP -Ensure adequate renal perfusion -Avoid nephrotoxic agents as able -Replace electrolytes as indicated   Diabetes Mellitus -CBG's -SSI -Follow ICU Hypo/hyperglycemic protocol   DISPOSITION: Stepdown GOALS OF CARE: Full code VTE PROPHYLAXIS:  Lovenox UPDATES: Updated pt at bedside 12/27.  Harlon DittyJeremiah Devell Parkerson, AGACNP-BC [facility] Pager: [phone] Cell: [phone]  04/30/2018, 5:28 AM

## 2018-04-30 NOTE — Progress Notes (Signed)
Patient transported on Bipap from ED to ICU, without incident. ICU RT aware.

## 2018-04-30 NOTE — Consult Note (Signed)
Reason for Consult: Shortness of breath heart failure elevated troponin Referring Physician: Dr. Oralia Manis hospitalist  Alexis Ray is an 62 y.o. female.  HPI: 62 year old black female history of congestive heart failure on Entresto patient complains of shortness of breath dyspnea came to the emergency room with significant wheezing slightly productive cough clear sputum presentation was suggestive of COPD per patient and elevated troponin BNP was elevated chest x-ray suggested possible failure so the patient was admitted for further assessment evaluation as well as respiratory failure.  Denies any significant chest pain improved on CPAP via rescue denied fever chills or sweats  Past Medical History:  Diagnosis Date  . CAD (coronary artery disease)   . Chronic combined systolic and diastolic CHF (congestive heart failure) (HCC)   . COPD (chronic obstructive pulmonary disease) (HCC)   . Diabetes mellitus without complication (HCC)   . GERD (gastroesophageal reflux disease)   . Hypertension   . PVD (peripheral vascular disease) (HCC)   . Stroke (cerebrum) [facility])     Past Surgical History:  Procedure Laterality Date  . ABDOMINAL HYSTERECTOMY    . CARDIAC CATHETERIZATION    . COLONOSCOPY    . EYE SURGERY Bilateral   . LEFT HEART CATH AND CORONARY ANGIOGRAPHY N/A 04/08/2018   Procedure: LEFT HEART CATH AND CORONARY ANGIOGRAPHY;  Surgeon: Laurier Nancy, MD;  Location: [facility] INVASIVE CV LAB;  Service: Cardiovascular;  Laterality: N/A;    Family History  Problem Relation Age of Onset  . Diabetes Mother   . Cancer Father   . Diabetes Maternal Aunt   . Diabetes Maternal Aunt     Social History:  reports that she has been smoking cigarettes. She has been smoking about 1.00 pack per day. She has never used smokeless tobacco. She reports that she does not drink alcohol or use drugs.  Allergies:  Allergies  Allergen Reactions  . Colchicine     itching  . Sulfa Antibiotics Rash     itching  . Levaquin [Levofloxacin In D5w] Rash    Medications: I have reviewed the patient's current medications.  Results for orders placed or performed during the hospital encounter of 04/29/18 (from the past 48 hour(s))  Basic metabolic panel     Status: Abnormal   Collection Time: 04/29/18  9:28 PM  Result Value Ref Range   Sodium 138 135 - 145 mmol/L   Potassium 4.3 3.5 - 5.1 mmol/L   Chloride 103 98 - 111 mmol/L   CO2 28 22 - 32 mmol/L   Glucose, Bld 249 (H) 70 - 99 mg/dL   BUN 18 8 - 23 mg/dL   Creatinine, Ser 1.61 (H) 0.44 - 1.00 mg/dL   Calcium 9.0 8.9 - 09.6 mg/dL   GFR calc non Af Amer 40 (L) >60 mL/min   GFR calc Af Amer 46 (L) >60 mL/min   Anion gap 7 5 - 15    Comment: Performed at [facility], [address] [place], [place] [address]  CBC with Differential     Status: Abnormal   Collection Time: 04/29/18  9:28 PM  Result Value Ref Range   WBC 8.5 4.0 - 10.5 K/uL   RBC 4.60 3.87 - 5.11 MIL/uL   Hemoglobin 12.2 12.0 - 15.0 g/dL   HCT 98.1 19.1 - 47.8 %   MCV 87.6 80.0 - 100.0 fL   MCH 26.5 26.0 - 34.0 pg   MCHC 30.3 30.0 - 36.0 g/dL   RDW 29.5 (H) 62.1 - 30.8 %  Platelets 161 150 - 400 K/uL   nRBC 0.0 0.0 - 0.2 %   Neutrophils Relative % 67 %   Neutro Abs 5.6 1.7 - 7.7 K/uL   Lymphocytes Relative 24 %   Lymphs Abs 2.1 0.7 - 4.0 K/uL   Monocytes Relative 9 %   Monocytes Absolute 0.8 0.1 - 1.0 K/uL   Eosinophils Relative 0 %   Eosinophils Absolute 0.0 0.0 - 0.5 K/uL   Basophils Relative 0 %   Basophils Absolute 0.0 0.0 - 0.1 K/uL   Immature Granulocytes 0 %   Abs Immature Granulocytes 0.03 0.00 - 0.07 K/uL    Comment: Performed at [facility], [address] [place], [place] [address]  Troponin I - Once     Status: Abnormal   Collection Time: 04/29/18  9:28 PM  Result Value Ref Range   Troponin I 0.19 (HH) <0.03 ng/mL    Comment: CRITICAL RESULT CALLED TO, READ BACK BY AND VERIFIED WITH GEORGIE HAHLE 04/29/18 @ 2156   MLK Performed at [facility], [address] [place], [place] [address]   Brain natriuretic peptide     Status: Abnormal   Collection Time: 04/29/18  9:28 PM  Result Value Ref Range   B Natriuretic Peptide 4,309.0 (H) 0.0 - 100.0 pg/mL    Comment: Performed at [facility], [address] [place], [place] [address]  Lactic acid, plasma     Status: None   Collection Time: 04/29/18  9:40 PM  Result Value Ref Range   Lactic Acid, Venous 1.7 0.5 - 1.9 mmol/L    Comment: Performed at [facility], [address] [place], [place] [address]  Influenza panel by PCR (type A & B)     Status: None   Collection Time: 04/29/18  9:51 PM  Result Value Ref Range   Influenza A By PCR NEGATIVE NEGATIVE   Influenza B By PCR NEGATIVE NEGATIVE    Comment: (NOTE) The Xpert Xpress Flu assay is intended as an aid in the diagnosis of  influenza and should not be used as a sole basis for treatment.  This  assay is FDA approved for nasopharyngeal swab specimens only. Nasal  washings and aspirates are unacceptable for Xpert Xpress Flu testing. Performed at [facility], [address] [place], [place] [address]   Urinalysis, Complete w Microscopic     Status: Abnormal   Collection Time: 04/29/18 10:57 PM  Result Value Ref Range   Color, Urine STRAW (A) YELLOW   APPearance CLEAR (A) CLEAR   Specific Gravity, Urine 1.006 1.005 - 1.030   pH 5.0 5.0 - 8.0   Glucose, UA 50 (A) NEGATIVE mg/dL   Hgb urine dipstick NEGATIVE NEGATIVE   Bilirubin Urine NEGATIVE NEGATIVE   Ketones, ur NEGATIVE NEGATIVE mg/dL   Protein, ur NEGATIVE NEGATIVE mg/dL   Nitrite NEGATIVE NEGATIVE   Leukocytes, UA NEGATIVE NEGATIVE   RBC / HPF 0-5 0 - 5 RBC/hpf   WBC, UA 0-5 0 - 5 WBC/hpf   Bacteria, UA RARE (A) NONE SEEN   Squamous Epithelial / LPF 0-5 0 - 5   Mucus PRESENT    Hyaline Casts, UA PRESENT     Comment: Performed at [facility], [address] [place], [place]  [address]  Glucose, capillary     Status: Abnormal   Collection Time: 04/30/18  1:00 AM  Result Value Ref Range   Glucose-Capillary 195 (H) 70 - 99 mg/dL  Glucose, capillary  Status: Abnormal   Collection Time: 04/30/18  3:16 AM  Result Value Ref Range   Glucose-Capillary 233 (H) 70 - 99 mg/dL   Comment 1 Notify RN    Comment 2 Document in Chart   MRSA PCR Screening     Status: Abnormal   Collection Time: 04/30/18  3:28 AM  Result Value Ref Range   MRSA by PCR POSITIVE (A) NEGATIVE    Comment:        The GeneXpert MRSA Assay (FDA approved for NASAL specimens only), is one component of a comprehensive MRSA colonization surveillance program. It is not intended to diagnose MRSA infection nor to guide or monitor treatment for MRSA infections. RESULT CALLED TO, READ BACK BY AND VERIFIED WITH:  Marcha Solders AT [id number] 04/30/18 SDR Performed at [facility], [address] [place], [place] [address]   Basic metabolic panel     Status: Abnormal   Collection Time: 04/30/18  5:00 AM  Result Value Ref Range   Sodium 140 135 - 145 mmol/L   Potassium 3.8 3.5 - 5.1 mmol/L   Chloride 104 98 - 111 mmol/L   CO2 25 22 - 32 mmol/L   Glucose, Bld 245 (H) 70 - 99 mg/dL   BUN 21 8 - 23 mg/dL   Creatinine, Ser 4.09 (H) 0.44 - 1.00 mg/dL   Calcium 9.0 8.9 - 81.1 mg/dL   GFR calc non Af Amer 38 (L) >60 mL/min   GFR calc Af Amer 45 (L) >60 mL/min   Anion gap 11 5 - 15    Comment: Performed at [facility], [address] [place], [place] [address]  CBC     Status: Abnormal   Collection Time: 04/30/18  5:00 AM  Result Value Ref Range   WBC 5.1 4.0 - 10.5 K/uL   RBC 4.87 3.87 - 5.11 MIL/uL   Hemoglobin 13.0 12.0 - 15.0 g/dL   HCT 29.5 62.1 - 30.8 %   MCV 86.4 80.0 - 100.0 fL   MCH 26.7 26.0 - 34.0 pg   MCHC 30.9 30.0 - 36.0 g/dL   RDW 65.7 (H) 84.6 - 96.2 %   Platelets 149 (L) 150 - 400 K/uL   nRBC 0.0 0.0 - 0.2 %    Comment: Performed at [facility], [address] [place] [address] [place], [place] [address]  Brain natriuretic peptide     Status: Abnormal   Collection Time: 04/30/18  5:00 AM  Result Value Ref Range   B Natriuretic Peptide >4,500.0 (H) 0.0 - 100.0 pg/mL    Comment: RESULT CONFIRMED BY MANUAL DILUTION.PMF Performed at [facility], [address] [place], [place] [address]   Magnesium     Status: None   Collection Time: 04/30/18  5:00 AM  Result Value Ref Range   Magnesium 2.4 1.7 - 2.4 mg/dL    Comment: Performed at [facility], [address] [place], [place] [address]  Glucose, capillary     Status: Abnormal   Collection Time: 04/30/18  9:29 AM  Result Value Ref Range   Glucose-Capillary 226 (H) 70 - 99 mg/dL  Glucose, capillary     Status: Abnormal   Collection Time: 04/30/18 11:40 AM  Result Value Ref Range   Glucose-Capillary 224 (H) 70 - 99 mg/dL    Dg Chest Port 1 View  Result Date: 04/30/2018 CLINICAL DATA:  Acute onset of respiratory failure. EXAM: PORTABLE CHEST 1 VIEW COMPARISON:  Chest radiograph performed 04/29/2018 FINDINGS: The lungs are  well-aerated. Vascular congestion is noted. Bibasilar airspace opacities raise concern for pulmonary edema. Small bilateral pleural effusions are noted. No pneumothorax is seen. The cardiomediastinal silhouette is mildly enlarged. No acute osseous abnormalities are seen. IMPRESSION: Vascular congestion and mild cardiomegaly. Bibasilar airspace opacities raise concern for pulmonary edema. Small bilateral pleural effusions noted. Electronically Signed   By: Roanna RaiderJeffery  Chang M.D.   On: 04/30/2018 05:27   Dg Chest Portable 1 View  Result Date: 04/29/2018 CLINICAL DATA:  Shortness of breath EXAM: PORTABLE CHEST 1 VIEW COMPARISON:  04/08/2018 FINDINGS: Cardiac shadow remains enlarged. Increased vascular congestion is noted with interstitial edema similar to that seen on the prior exam. No focal infiltrate or effusion is seen. No bony abnormality is noted. IMPRESSION: Mild  CHF stable from the previous exam. Electronically Signed   By: Alcide CleverMark  Lukens M.D.   On: 04/29/2018 22:15    Review of Systems  Constitutional: Positive for diaphoresis and malaise/fatigue.  HENT: Positive for congestion.   Eyes: Negative.   Respiratory: Positive for shortness of breath and wheezing.   Cardiovascular: Positive for orthopnea and PND.  Gastrointestinal: Negative.   Genitourinary: Negative.   Musculoskeletal: Negative.   Skin: Negative.   Neurological: Negative.   Endo/Heme/Allergies: Negative.   Psychiatric/Behavioral: Negative.    Blood pressure 121/72, pulse (!) 101, temperature 97.6 F (36.4 C), resp. rate 20, height 5\' 5"  (1.651 m), weight 86.8 kg, SpO2 100 %. Physical Exam  Nursing note and vitals reviewed. Constitutional: She is oriented to person, place, and time. She appears well-developed and well-nourished.  HENT:  Head: Normocephalic and atraumatic.  Eyes: Pupils are equal, round, and reactive to light. Conjunctivae and EOM are normal.  Neck: Normal range of motion. Neck supple.  Cardiovascular: Normal rate, regular rhythm and normal heart sounds.  Respiratory: Effort normal. She has decreased breath sounds in the right upper field, the right middle field, the right lower field, the left upper field, the left middle field and the left lower field. She has rhonchi in the right upper field, the right middle field, the right lower field, the left upper field, the left middle field and the left lower field.  GI: Soft. Bowel sounds are normal.  Musculoskeletal: Normal range of motion.  Neurological: She is alert and oriented to person, place, and time. She has normal reflexes.  Skin: Skin is warm and dry.  Psychiatric: She has a normal mood and affect.    Assessment/Plan: Shortness of breath COPD Congestive heart failure Diabetes GERD Peripheral vascular disease CVA by history Hypertension Respiratory failure Chronic renal insufficiency Elevated  troponin . Plan Respiratory support Continue inhalers for COPD Follow-up troponins and EKGs Continue intravenous anticoagulation with heparin Echocardiogram to help for further assessment of ventricular function Continue CPAP BiPAP therapy as needed Maintain PPIs for reflux symptoms Maintain diabetes management and control Continue lipid management with Crestor Continue Entresto for heart failure therapy including spironolactone  Brien Lowe D Brinsley Wence 04/30/2018, 2:42 PM

## 2018-05-01 ENCOUNTER — Inpatient Hospital Stay: Payer: Medicare HMO

## 2018-05-01 LAB — BASIC METABOLIC PANEL
Anion gap: 11 (ref 5–15)
BUN: 34 mg/dL — ABNORMAL HIGH (ref 8–23)
CALCIUM: 8.7 mg/dL — AB (ref 8.9–10.3)
CO2: 25 mmol/L (ref 22–32)
Chloride: 103 mmol/L (ref 98–111)
Creatinine, Ser: 1.66 mg/dL — ABNORMAL HIGH (ref 0.44–1.00)
GFR calc Af Amer: 38 mL/min — ABNORMAL LOW (ref >60)
GFR calc non Af Amer: 33 mL/min — ABNORMAL LOW (ref >60)
GLUCOSE: 172 mg/dL — AB (ref 70–99)
Potassium: 4.8 mmol/L (ref 3.5–5.1)
Sodium: 139 mmol/L (ref 135–145)

## 2018-05-01 LAB — CBC
HEMATOCRIT: 42.7 % (ref 36.0–46.0)
Hemoglobin: 12.9 g/dL (ref 12.0–15.0)
MCH: 26 pg (ref 26.0–34.0)
MCHC: 30.2 g/dL (ref 30.0–36.0)
MCV: 85.9 fL (ref 80.0–100.0)
Platelets: 147 10*3/uL — ABNORMAL LOW (ref 150–400)
RBC: 4.97 MIL/uL (ref 3.87–5.11)
RDW: 16 % — AB (ref 11.5–15.5)
WBC: 9.4 10*3/uL (ref 4.0–10.5)
nRBC: 0 % (ref 0.0–0.2)

## 2018-05-01 LAB — GLUCOSE, CAPILLARY
Glucose-Capillary: 157 mg/dL — ABNORMAL HIGH (ref 70–99)
Glucose-Capillary: 308 mg/dL — ABNORMAL HIGH (ref 70–99)
Glucose-Capillary: 333 mg/dL — ABNORMAL HIGH (ref 70–99)
Glucose-Capillary: 241 mg/dL — ABNORMAL HIGH (ref 70–99)

## 2018-05-01 LAB — BRAIN NATRIURETIC PEPTIDE: B Natriuretic Peptide: >4500 pg/mL — ABNORMAL HIGH (ref 0.0–100.0)

## 2018-05-01 LAB — HIV ANTIBODY (ROUTINE TESTING W REFLEX): HIV Screen 4th Generation wRfx: NONREACTIVE

## 2018-05-01 LAB — MAGNESIUM: Magnesium: 2.4 mg/dL (ref 1.7–2.4)

## 2018-05-01 MED ORDER — INSULIN ASPART 100 UNIT/ML ~~LOC~~ SOLN
0.0000 [IU] | Freq: Three times a day (TID) | SUBCUTANEOUS | Status: DC
  Administered 2018-05-01 (×2): 7 [IU] via SUBCUTANEOUS
  Administered 2018-05-01: 2 [IU] via SUBCUTANEOUS
  Administered 2018-05-01 – 2018-05-02 (×2): 3 [IU] via SUBCUTANEOUS
  Filled 2018-05-01 (×5): qty 1

## 2018-05-01 MED ORDER — FUROSEMIDE 10 MG/ML IJ SOLN
40.0000 mg | Freq: Two times a day (BID) | INTRAMUSCULAR | Status: DC
  Administered 2018-05-01 (×2): 40 mg via INTRAVENOUS
  Filled 2018-05-01 (×3): qty 4

## 2018-05-01 NOTE — Evaluation (Signed)
Physical Therapy Evaluation Patient Details Name: Alexis Ray MRN: [id number] DOB: [birth year] Today's Date: 05/01/2018   History of Present Illness  Patient is a 62 year old female admitted COPD exacerbation, Htn and acute on chronic CHF.  PMH includes stroke and PVD in addition to COPD and CHF.  Clinical Impression  Pt is a 62 year old female who lives in a single story home with her daughter.  Pt is independent with occasional use of AD at baseline.  She was able to perform bed mobility and STS without physical assistance from PT but did rely on RW for stability.  PT able to ambulate 200 ft with need for RW for stability and energy conservation.  Pt's O2 did desat to upper 80% range with ambulation and quickly recovered when sitting.  Pt presented as orthostatic when initially standing but BP increased slightly with activity.  She presented with fair strength of UE/LE.  Pt will benefit from continued skilled therapy for increased tolerance to exercise and strengthening.  [facility] due to concern for transportation and scheduling around other appointments and daughters work schedule.  Pt would also benefit from pulmonary rehabilitation if transportation can be arranged.    Follow Up Recommendations Home health PT(Pt would benefit from pulmonary rehab if she can schedule around other appointments and have her daughters to drive her.  Pt concerned that she can not always drive herself to appointments.)    Equipment Recommendations  Rolling walker with 5" wheels    Recommendations for Other Services       Precautions / Restrictions Restrictions Weight Bearing Restrictions: No      Mobility  Bed Mobility Overal bed mobility: Independent                Transfers Overall transfer level: Needs assistance Equipment used: Rolling walker (2 wheeled) Transfers: Sit to/from Stand Sit to Stand: Supervision         General transfer comment: Pt able to stand with  out assistance to rise.  Uses RW for support.  Able to stand without holding to RW once standing.  Ambulation/Gait Ambulation/Gait assistance: Supervision Gait Distance (Feet): 200 Feet Assistive device: Rolling walker (2 wheeled)     Gait velocity interpretation: <1.8 ft/sec, indicate of risk for recurrent falls General Gait Details: FAir foot clearance and step length, needs RW for stability and energy conservation with longer distance walking.  Able to walk short distances in room without RW.  Pt desat to upper 80% range when walking and quickly recovered to 97% on 4 L of O2 once sitting (within 10 sec).  Stairs            Wheelchair Mobility    Modified Rankin (Stroke Patients Only)       Balance                                             Pertinent Vitals/Pain Pain Assessment: No/denies pain    Home Living Family/patient expects to be discharged to:: Private residence Living Arrangements: Children(Daughter) Available Help at Discharge: Family;Available PRN/intermittently Type of Home: Mobile home Home Access: Stairs to enter Entrance Stairs-Rails: Can reach both Entrance Stairs-Number of Steps: 5 Home Layout: One level        Prior Function Level of Independence: Independent with assistive device(s)         Comments:  Pt states that she has used  a RW in the past but no longer has one because it "disappeared".     Hand Dominance        Extremity/Trunk Assessment   Upper Extremity Assessment Upper Extremity Assessment: Overall WFL for tasks assessed    Lower Extremity Assessment Lower Extremity Assessment: Overall WFL for tasks assessed    Cervical / Trunk Assessment Cervical / Trunk Assessment: Normal  Communication   Communication: No difficulties  Cognition Arousal/Alertness: Awake/alert Behavior During Therapy: WFL for tasks assessed/performed Overall Cognitive Status: Within Functional Limits for tasks assessed                                  General Comments: Pleasant and ready to work with therapy.      General Comments      Exercises Other Exercises Other Exercises: Reviewed HEP seated exercises including ankle pumps, pillow squeezes, SLR, hip abduction and heel slides instructing pt to perform at least 15x and to work muscles to fatigue.  Pt demonstrated understanding. x10 min Other Exercises: Monitored orthostatic vitals. x6 min   Assessment/Plan    PT Assessment Patient needs continued PT services  PT Problem List Decreased strength;Decreased mobility;Decreased activity tolerance;Decreased balance       PT Treatment Interventions DME instruction;Functional mobility training;Balance training;Patient/family education;Gait training;Therapeutic activities;Stair training;Therapeutic exercise    PT Goals (Current goals can be found in the Care Plan section)  Acute Rehab PT Goals Patient Stated Goal: To return to community ambulation with RW if needed. PT Goal Formulation: With patient Time For Goal Achievement: 05/15/18 Potential to Achieve Goals: Good    Frequency Min 2X/week   Barriers to discharge        Co-evaluation               AM-PAC PT "6 Clicks" Mobility  Outcome Measure Help needed turning from your back to your side while in a flat bed without using bedrails?: None Help needed moving from lying on your back to sitting on the side of a flat bed without using bedrails?: A Little Help needed moving to and from a bed to a chair (including a wheelchair)?: A Little Help needed standing up from a chair using your arms (e.g., wheelchair or bedside chair)?: A Little Help needed to walk in hospital room?: A Little Help needed climbing 3-5 steps with a railing? : A Little 6 Click Score: 19    End of Session Equipment Utilized During Treatment: Gait belt;Oxygen Activity Tolerance: Patient tolerated treatment well Patient left: in chair;with call bell/phone  within reach;with chair alarm set   PT Visit Diagnosis: Unsteadiness on feet (R26.81);Muscle weakness (generalized) (M62.81)    Time: [id number] PT Time Calculation (min) (ACUTE ONLY): 31 min   Charges:   PT Evaluation $PT Eval Low Complexity: 1 Low PT Treatments $Therapeutic Activity: 8-22 mins        Glenetta HewSarah Jaydence Arnesen, PT, DPT   Glenetta HewSarah Vikram Tillett 05/01/2018, 3:50 PM

## 2018-05-01 NOTE — Progress Notes (Signed)
[employer] - [place] at [facility]   PATIENT NAME: Alexis GaribaldiDebra Ray    MR#:  [id number]  DATE OF BIRTH:  Aug 17, 1955  SUBJECTIVE:  CHIEF COMPLAINT:   Chief Complaint  Patient presents with  . Shortness of Breath   -Breathing is much better today.  On 4 L oxygen which is her chronic home O2.  Used CPAP last night -Still has rhonchorous breath sounds.  REVIEW OF SYSTEMS:  Review of Systems  Constitutional: Negative for chills, fever and malaise/fatigue.  HENT: Negative for congestion, ear discharge, hearing loss and nosebleeds.   Respiratory: Positive for cough and shortness of breath. Negative for wheezing.   Cardiovascular: Negative for chest pain, palpitations and leg swelling.  Gastrointestinal: Negative for abdominal pain, constipation, diarrhea, nausea and vomiting.  Genitourinary: Negative for dysuria.  Neurological: Negative for dizziness, focal weakness, seizures, weakness and headaches.  Psychiatric/Behavioral: Negative for depression.    DRUG ALLERGIES:   Allergies  Allergen Reactions  . Colchicine     itching  . Sulfa Antibiotics Rash    itching  . Levaquin [Levofloxacin In D5w] Rash    VITALS:  Blood pressure (!) 112/51, pulse 81, temperature 98 F (36.7 C), temperature source Oral, resp. rate 20, height 5\' 5"  (1.651 m), weight 86.8 kg, SpO2 100 %.  PHYSICAL EXAMINATION:  Physical Exam   GENERAL:  62 y.o.-year-old patient lying in the bed with no acute distress.  EYES: Pupils equal, round, reactive to light and accommodation. No scleral icterus. Extraocular muscles intact.  HEENT: Head atraumatic, normocephalic. Oropharynx and nasopharynx clear.  NECK:  Supple, no jugular venous distention. No thyroid enlargement, no tenderness.  LUNGS: Moving air bilaterally has coarse rhonchi all over the lung fields,, occasional scattered wheezing, no rales  or crepitation. No use of accessory muscles of respiration.  CARDIOVASCULAR: S1, S2 normal. No   rubs, or gallops.  2/6 midsystolic murmur heard in the second intercostal space ABDOMEN: Soft, nontender, nondistended. Bowel sounds present. No organomegaly or mass.  EXTREMITIES: No pedal edema, cyanosis, or clubbing.  NEUROLOGIC: Cranial nerves II through XII are intact. Muscle strength 5/5 in all extremities. Sensation intact. Gait not checked.  PSYCHIATRIC: The patient is alert and oriented x 3.  SKIN: No obvious rash, lesion, or ulcer.    LABORATORY PANEL:   CBC Recent Labs  Lab 05/01/18 0413  WBC 9.4  HGB 12.9  HCT 42.7  PLT 147*   ------------------------------------------------------------------------------------------------------------------  Chemistries  Recent Labs  Lab 05/01/18 0413  NA 139  K 4.8  CL 103  CO2 25  GLUCOSE 172*  BUN 34*  CREATININE 1.66*  CALCIUM 8.7*  MG 2.4   ------------------------------------------------------------------------------------------------------------------  Cardiac Enzymes Recent Labs  Lab 04/29/18 2128  TROPONINI 0.19*   ------------------------------------------------------------------------------------------------------------------  RADIOLOGY:  Dg Chest Port 1 View  Result Date: 05/01/2018 CLINICAL DATA:  62 year old with acute respiratory failure. Current smoker with hypertension, diabetes, COPD, CHF, coronary artery disease and peripheral vascular disease. EXAM: PORTABLE CHEST 1 VIEW COMPARISON:  04/30/2018 and earlier. FINDINGS: Cardiac silhouette markedly enlarged, unchanged. Diffuse interstitial pulmonary edema, unchanged since the examination 2 days ago and also unchanged dating back to 04/07/2018. The interstitial pulmonary edema is superimposed upon chronic interstitial lung disease. Dense LEFT LOWER LOBE consolidation persists unchanged. No new pulmonary parenchymal abnormalities. IMPRESSION: 1. Stable acute CHF superimposed upon chronic interstitial lung disease. 2. Stable dense LEFT LOWER LOBE atelectasis  and/or pneumonia (atelectasis is favored). 3. No new abnormalities. Electronically Signed   By: Kayren Eaveshomas  Lawrence M.D.  On: 05/01/2018 08:11   Dg Chest Port 1 View  Result Date: 04/30/2018 CLINICAL DATA:  Acute onset of respiratory failure. EXAM: PORTABLE CHEST 1 VIEW COMPARISON:  Chest radiograph performed 04/29/2018 FINDINGS: The lungs are well-aerated. Vascular congestion is noted. Bibasilar airspace opacities raise concern for pulmonary edema. Small bilateral pleural effusions are noted. No pneumothorax is seen. The cardiomediastinal silhouette is mildly enlarged. No acute osseous abnormalities are seen. IMPRESSION: Vascular congestion and mild cardiomegaly. Bibasilar airspace opacities raise concern for pulmonary edema. Small bilateral pleural effusions noted. Electronically Signed   By: Roanna RaiderJeffery  Chang M.D.   On: 04/30/2018 05:27   Dg Chest Portable 1 View  Result Date: 04/29/2018 CLINICAL DATA:  Shortness of breath EXAM: PORTABLE CHEST 1 VIEW COMPARISON:  04/08/2018 FINDINGS: Cardiac shadow remains enlarged. Increased vascular congestion is noted with interstitial edema similar to that seen on the prior exam. No focal infiltrate or effusion is seen. No bony abnormality is noted. IMPRESSION: Mild CHF stable from the previous exam. Electronically Signed   By: Alcide CleverMark  Lukens M.D.   On: 04/29/2018 22:15    EKG:   Orders placed or performed during the hospital encounter of 04/29/18  . EKG 12-Lead  . EKG 12-Lead  . EKG 12-Lead  . EKG 12-Lead    ASSESSMENT AND PLAN:   62 year old female with past medical history significant for COPD not on home oxygen, CAD, combined heart failure, GERD, hypertension and history of stroke presents from home secondary to worsening shortness of breath.  1.  Acute hypoxic respiratory failure-secondary to COPD and CHF exacerbation -Chest x-Ray consistent with vascular congestion -Admitted to ICU on BiPAP.  Off BiPAP, currently on 4 L oxygen via nasal cannula  which is chronic home O2. -On IV steroids and azithromycin.  No evidence of pneumonia noted on chest x-Ray Stop azithromycin after 3 days of 500 mg dose.   2.  Acute on chronic CHF-echocardiogram is pending.   -Change to oral Lasix when improving -Last echocardiogram about a month ago showing EF of less than 15% -Restarted Entresto, Coreg, Aldactone if blood pressure is improving  3.  CAD-continue home meds.  Patient on aspirin, Coreg, Entresto, Aldactone and statin  4.  Diabetes mellitus-restarted Lantus..  Also on sliding scale insulin.  5.  Depression-on Effexo  6.  PAD-on Pletal  7.  DVT prophylaxis-currently on Lovenox  Physical therapy consulted.  Possible discharge tomorrow    All the records are reviewed and case discussed with Care Management/Social Workerr. Management plans discussed with the patient, family and they are in agreement.  CODE STATUS: Full code  TOTAL TIME TAKING CARE OF THIS PATIENT: 37 minutes.   POSSIBLE D/C IN 1-2 DAYS, DEPENDING ON CLINICAL CONDITION.   Enid Baasadhika Jeramy Dimmick M.D on 05/01/2018 at 10:09 AM  Between 7am to 6pm - Pager - [phone]  After 6pm go to [email or web address] - Social research officer, governmentpassword EPAS [facility]  [facility] [place] [facility]  Office  [phone]  CC: Primary care physician; Keane PoliceNoorani, Sezmin S, MD

## 2018-05-01 NOTE — Progress Notes (Signed)
Pharmacy Electrolyte Monitoring Consult:  Pharmacy consulted to assist in monitoring and replacing electrolytes in this 62 y.o. female admitted on 04/29/2018 with CHF exacerbation.   Labs:  Sodium (mmol/L)  Date Value  05/01/2018 139  12/11/2013 136   Potassium (mmol/L)  Date Value  05/01/2018 4.8  04/10/2014 3.8   Magnesium (mg/dL)  Date Value  16/10/960412/28/2019 2.4  12/04/2011 1.8   Calcium (mg/dL)  Date Value  54/09/811912/28/2019 8.7 (L)   Calcium, Total (mg/dL)  Date Value  14/78/295608/01/2014 8.5   Albumin (g/dL)  Date Value  21/30/865712/06/2017 3.6  12/11/2013 3.2 (L)    Assessment/Plan: Patient now out of CCU, IV furosemide is stopped, and electrolytes are WNL. Discontinue Klor-Con ordered during diuresis. Patient does not have Klor-Con listed on their home med summary. Pharmacy will sign off CCM consult. Please re-consult if further assistance is desired.  Carola FrostNathan A Navreet Bolda, PharmD, BCPS Clinical Pharmacist 05/01/2018 7:12 AM

## 2018-05-02 ENCOUNTER — Inpatient Hospital Stay (HOSPITAL_COMMUNITY)
Admit: 2018-05-02 | Discharge: 2018-05-02 | Disposition: A | Discharge status: Home / Self Care | Payer: Medicare HMO | Attending: Pulmonary Disease | Admitting: Pulmonary Disease

## 2018-05-02 DIAGNOSIS — I351 Nonrheumatic aortic (valve) insufficiency: Secondary | ICD-10-CM

## 2018-05-02 DIAGNOSIS — I340 Nonrheumatic mitral (valve) insufficiency: Secondary | ICD-10-CM

## 2018-05-02 LAB — BASIC METABOLIC PANEL
Anion gap: 8 (ref 5–15)
BUN: 51 mg/dL — ABNORMAL HIGH (ref 8–23)
CO2: 25 mmol/L (ref 22–32)
Calcium: 8.3 mg/dL — ABNORMAL LOW (ref 8.9–10.3)
Chloride: 103 mmol/L (ref 98–111)
Creatinine, Ser: 1.82 mg/dL — ABNORMAL HIGH (ref 0.44–1.00)
GFR calc Af Amer: 34 mL/min — ABNORMAL LOW (ref >60)
GFR calc non Af Amer: 29 mL/min — ABNORMAL LOW (ref >60)
Glucose, Bld: 211 mg/dL — ABNORMAL HIGH (ref 70–99)
Potassium: 4.4 mmol/L (ref 3.5–5.1)
Sodium: 136 mmol/L (ref 135–145)

## 2018-05-02 LAB — ECHOCARDIOGRAM COMPLETE
Height: 65 in
Weight: 3139.2 oz

## 2018-05-02 LAB — GLUCOSE, CAPILLARY
Glucose-Capillary: 244 mg/dL — ABNORMAL HIGH (ref 70–99)
Glucose-Capillary: 424 mg/dL — ABNORMAL HIGH (ref 70–99)

## 2018-05-02 LAB — BRAIN NATRIURETIC PEPTIDE: B Natriuretic Peptide: 1757 pg/mL — ABNORMAL HIGH (ref 0.0–100.0)

## 2018-05-02 MED ORDER — FLUTICASONE PROPIONATE 50 MCG/ACT NA SUSP
1.0000 | Freq: Every day | NASAL | Status: DC
  Administered 2018-05-02: 1 via NASAL
  Filled 2018-05-02: qty 16

## 2018-05-02 MED ORDER — GUAIFENESIN ER 600 MG PO TB12
600.0000 mg | ORAL_TABLET | Freq: Two times a day (BID) | ORAL | Status: DC
  Administered 2018-05-02: 600 mg via ORAL
  Filled 2018-05-02: qty 1

## 2018-05-02 MED ORDER — GUAIFENESIN ER 600 MG PO TB12: 600.0000 mg | ORAL_TABLET | Freq: Two times a day (BID) | ORAL | 0 refills | Status: AC

## 2018-05-02 MED ORDER — INSULIN ASPART 100 UNIT/ML ~~LOC~~ SOLN
20.0000 [IU] | Freq: Once | SUBCUTANEOUS | Status: AC
  Administered 2018-05-02: 20 [IU] via SUBCUTANEOUS
  Filled 2018-05-02: qty 1

## 2018-05-02 MED ORDER — INSULIN GLARGINE 100 UNIT/ML ~~LOC~~ SOLN
15.0000 [IU] | Freq: Once | SUBCUTANEOUS | Status: AC
  Administered 2018-05-02: 15 [IU] via SUBCUTANEOUS
  Filled 2018-05-02: qty 0.15

## 2018-05-02 MED ORDER — PREDNISONE 20 MG PO TABS: 40.0000 mg | ORAL_TABLET | Freq: Every day | ORAL | 0 refills | Status: AC

## 2018-05-02 NOTE — Discharge Summary (Signed)
[employer] - [place] at [facility]   PATIENT NAME: Alexis Ray    MR#:  [id number]  DATE OF BIRTH:  04/20/1956  DATE OF ADMISSION:  04/29/2018   ADMITTING PHYSICIAN: Oralia Manis, MD  DATE OF DISCHARGE:  05/02/18  PRIMARY CARE PHYSICIAN: Keane Police, MD   ADMISSION DIAGNOSIS:   Acute respiratory failure (HCC) [J96.00] COPD exacerbation (HCC) [J44.1] Acute respiratory failure with hypoxia (HCC) [J96.01] Chronic congestive heart failure, unspecified heart failure type (HCC) [I50.9]  DISCHARGE DIAGNOSIS:   Principal Problem:   Acute respiratory failure with hypoxia (HCC) Active Problems:   COPD with acute exacerbation (HCC)   GERD (gastroesophageal reflux disease)   Type 2 diabetes mellitus (HCC)   HTN (hypertension)   OSA on CPAP   Acute on chronic systolic CHF (congestive heart failure) (HCC)   SECONDARY DIAGNOSIS:   Past Medical History:  Diagnosis Date  . CAD (coronary artery disease)   . Chronic combined systolic and diastolic CHF (congestive heart failure) (HCC)   . COPD (chronic obstructive pulmonary disease) (HCC)   . Diabetes mellitus without complication (HCC)   . GERD (gastroesophageal reflux disease)   . Hypertension   . PVD (peripheral vascular disease) (HCC)   . Stroke (cerebrum) [facility])     HOSPITAL COURSE:   62 year old female with past medical history significant for COPD not on home oxygen, CAD, combined heart failure, GERD, hypertension and history of stroke presents from home secondary to worsening shortness of breath.  1.  Acute hypoxic respiratory failure-secondary to COPD and CHF exacerbation -Chest x-ray consistent with vascular congestion -Admitted to ICU on BiPAP.  Off BiPAP, currently on 4 L oxygen via nasal cannula which is chronic home O2. -On IV steroids and azithromycin.  No evidence of pneumonia noted on chest x-ray Stop azithromycin today as finished 3 days of 500 mg dose. -Oral steroids and Mucinex at  discharge -Continue to smoke up until 2 weeks ago when her respiratory symptoms were worse.  Strongly counseled against it  2.  Acute on chronic CHF-echocardiogram is done -Received IV Lasix yesterday with drop in BNP.  Soft blood pressures today. -Change Lasix back to home dose of 40 mg daily..   -Change to oral Lasix when improving -Last echocardiogram about a month ago showing EF of less than 15% -Restarted Entresto, Coreg, Aldactone  3.  CAD-continue home meds.  Patient on aspirin, Coreg, Entresto, Aldactone and statin  4.  Diabetes mellitus-restart home medications.  Patient on dulaglutide, Lantus, NovoLog.  5.  Depression-on Effexo  6.  PAD-on Pletal  Ambulated well with physical therapy.  Feels like she is at her baseline.  Will be discharged with home health today  DISCHARGE CONDITIONS:   Guarded  CONSULTS OBTAINED:   Treatment Team:  Alwyn Pea, MD  DRUG ALLERGIES:   Allergies  Allergen Reactions  . Colchicine     itching  . Sulfa Antibiotics Rash    itching  . Levaquin [Levofloxacin In D5w] Rash   DISCHARGE MEDICATIONS:   Allergies as of 05/02/2018      Reactions   Colchicine    itching   Sulfa Antibiotics Rash   itching   Levaquin [levofloxacin In D5w] Rash      Medication List    STOP taking these medications   COLCRYS 0.6 MG tablet Generic drug:  colchicine     TAKE these medications   acetaminophen 500 MG tablet Commonly known as:  TYLENOL Take 1 tablet by mouth every 6 (  six) hours as needed.   VENTOLIN HFA 108 (90 Base) MCG/ACT inhaler Generic drug:  albuterol Inhale 2 puffs into the lungs every 6 (six) hours as needed for wheezing or shortness of breath.   albuterol (2.5 MG/3ML) 0.083% nebulizer solution Commonly known as:  PROVENTIL Take 3 mLs (2.5 mg total) by nebulization every 6 (six) hours as needed for wheezing or shortness of breath.   aspirin EC 81 MG tablet Take 81 mg by mouth daily.     budesonide-formoterol 160-4.5 MCG/ACT inhaler Commonly known as:  SYMBICORT Inhale 2 puffs into the lungs 2 (two) times daily.   carvedilol 6.25 MG tablet Commonly known as:  COREG Take 6.25 mg by mouth 2 (two) times daily with a meal.   cholecalciferol 25 MCG (1000 UT) tablet Commonly known as:  VITAMIN D3 Take 1,000 Units by mouth daily.   cilostazol 100 MG tablet Commonly known as:  PLETAL Take 100 mg by mouth 2 (two) times daily.   Dulaglutide 1.5 MG/0.5ML Sopn Inject 1.5 mg into the skin every Monday.   ENTRESTO 49-51 MG Generic drug:  sacubitril-valsartan Take 1 tablet by mouth 2 (two) times daily.   esomeprazole 40 MG capsule Commonly known as:  NEXIUM Take 40 mg by mouth daily.   ferrous sulfate 325 (65 FE) MG tablet Take 325 mg by mouth daily with breakfast.   fluticasone 50 MCG/ACT nasal spray Commonly known as:  FLONASE Place 1 spray into both nostrils 2 (two) times daily.   furosemide 40 MG tablet Commonly known as:  LASIX Take 1 tablet (40 mg total) by mouth daily.   guaiFENesin 600 MG 12 hr tablet Commonly known as:  MUCINEX Take 1 tablet (600 mg total) by mouth 2 (two) times daily.   insulin aspart 100 UNIT/ML injection Commonly known as:  novoLOG Inject 10 Units into the skin 3 (three) times daily with meals.   insulin glargine 100 UNIT/ML injection Commonly known as:  LANTUS Inject 45 Units into the skin at bedtime.   levocetirizine 5 MG tablet Commonly known as:  XYZAL Take 5 mg by mouth at bedtime.   metFORMIN 500 MG tablet Commonly known as:  GLUCOPHAGE Take 500 mg by mouth 2 (two) times daily with a meal.   MULTIVITAMIN ADULT Tabs Take 1 tablet by mouth daily. What changed:  Another medication with the same name was removed. Continue taking this medication, and follow the directions you see here.   nitroGLYCERIN 0.4 MG SL tablet Commonly known as:  NITROSTAT Place 0.4 mg under the tongue every 5 (five) minutes as needed for chest  pain.   pramipexole 0.125 MG tablet Commonly known as:  MIRAPEX Take 0.125 mg by mouth at bedtime.   predniSONE 20 MG tablet Commonly known as:  DELTASONE Take 2 tablets (40 mg total) by mouth daily for 5 days.   pregabalin 150 MG capsule Commonly known as:  LYRICA Take 150 mg by mouth 3 (three) times daily.   rosuvastatin 40 MG tablet Commonly known as:  CRESTOR Take 40 mg by mouth daily.   SPIRIVA RESPIMAT 2.5 MCG/ACT Aers Generic drug:  Tiotropium Bromide Monohydrate Inhale 2 puffs into the lungs daily.   spironolactone 25 MG tablet Commonly known as:  ALDACTONE Take 25 mg by mouth daily.   Venlafaxine HCl 225 MG Tb24 Take 225 mg by mouth daily.   vitamin C 500 MG tablet Commonly known as:  ASCORBIC ACID Take 500 mg by mouth daily.  DISCHARGE INSTRUCTIONS:   1.  PCP follow-up in 1 to 2 weeks 2.  Cardiology follow-up in 2 weeks 3.  CHF clinic follow-up  DIET:   Cardiac diet  ACTIVITY:   Activity as tolerated  OXYGEN:   Home Oxygen: Yes.    Oxygen Delivery: 4 liters/min via Patient connected to nasal cannula oxygen  DISCHARGE LOCATION:   home   If you experience worsening of your admission symptoms, develop shortness of breath, life threatening emergency, suicidal or homicidal thoughts you must seek medical attention immediately by calling 911 or calling your MD immediately  if symptoms less severe.  You Must read complete instructions/literature along with all the possible adverse reactions/side effects for all the Medicines you take and that have been prescribed to you. Take any new Medicines after you have completely understood and accpet all the possible adverse reactions/side effects.   Please note  You were cared for by a hospitalist during your hospital stay. If you have any questions about your discharge medications or the care you received while you were in the hospital after you are discharged, you can call the unit and asked to speak  with the hospitalist on call if the hospitalist that took care of you is not available. Once you are discharged, your primary care physician will handle any further medical issues. Please note that NO REFILLS for any discharge medications will be authorized once you are discharged, as it is imperative that you return to your primary care physician (or establish a relationship with a primary care physician if you do not have one) for your aftercare needs so that they can reassess your need for medications and monitor your lab values.    On the day of Discharge:  VITAL SIGNS:   Blood pressure (!) 103/54, pulse 90, temperature 97.9 F (36.6 C), temperature source Oral, resp. rate 20, height 5\' 5"  (1.651 m), weight 89 kg, SpO2 98 %.  PHYSICAL EXAMINATION:    GENERAL:  62 y.o.-year-old patient lying in the bed with no acute distress.  EYES: Pupils equal, round, reactive to light and accommodation. No scleral icterus. Extraocular muscles intact.  HEENT: Head atraumatic, normocephalic. Oropharynx and nasopharynx clear.  NECK:  Supple, no jugular venous distention. No thyroid enlargement, no tenderness.  LUNGS: Moving air bilaterally has coarse rhonchi all over the lung fields,, occasional scattered wheezing, no rales  or crepitation. No use of accessory muscles of respiration.  CARDIOVASCULAR: S1, S2 normal. No  rubs, or gallops.  2/6 midsystolic murmur heard in the second intercostal space ABDOMEN: Soft, nontender, nondistended. Bowel sounds present. No organomegaly or mass.  EXTREMITIES: No pedal edema, cyanosis, or clubbing.  NEUROLOGIC: Cranial nerves II through XII are intact. Muscle strength 5/5 in all extremities. Sensation intact. Gait not checked.  PSYCHIATRIC: The patient is alert and oriented x 3.  SKIN: No obvious rash, lesion, or ulcer.   DATA REVIEW:   CBC Recent Labs  Lab 05/01/18 0413  WBC 9.4  HGB 12.9  HCT 42.7  PLT 147*    Chemistries  Recent Labs  Lab  05/01/18 0413 05/02/18 0437  NA 139 136  K 4.8 4.4  CL 103 103  CO2 25 25  GLUCOSE 172* 211*  BUN 34* 51*  CREATININE 1.66* 1.82*  CALCIUM 8.7* 8.3*  MG 2.4  --      Microbiology Results  Results for orders placed or performed during the hospital encounter of 04/29/18  MRSA PCR Screening     Status: Abnormal  Collection Time: 04/30/18  3:28 AM  Result Value Ref Range Status   MRSA by PCR POSITIVE (A) NEGATIVE Final    Comment:        The GeneXpert MRSA Assay (FDA approved for NASAL specimens only), is one component of a comprehensive MRSA colonization surveillance program. It is not intended to diagnose MRSA infection nor to guide or monitor treatment for MRSA infections. RESULT CALLED TO, READ BACK BY AND VERIFIED WITH:  Marcha SoldersONI WALKER AT [id number] 04/30/18 SDR Performed at [facility], [address] [place], [place] [address]     RADIOLOGY:  No results found.   Management plans discussed with the patient, family and they are in agreement.  CODE STATUS:     Code Status Orders  (From admission, onward)         Start     Ordered   04/30/18 0013  Full code  Continuous     04/30/18 0012        Code Status History    Date Active Date Inactive Code Status Order ID Comments User Context   04/06/2018 0524 04/09/2018 1904 Full Code [id number]  Arnaldo Nataliamond, Michael S, MD Inpatient   06/05/2016 2320 06/09/2016 1759 Full Code [id number]  Tonye RoyaltyHugelmeyer, Alexis, DO ED   05/29/2016 0643 05/30/2016 2000 Full Code [id number]  Arnaldo Nataliamond, Michael S, MD ED   05/23/2015 2256 05/26/2015 1947 Full Code [id number]  Oralia ManisWillis, David, MD Inpatient    Advance Directive Documentation     Most Recent Value  Type of Advance Directive  Healthcare Power of Attorney  Pre-existing out of facility DNR order (yellow form or pink MOST form)  -  "MOST" Form in Place?  -      TOTAL TIME TAKING CARE OF THIS PATIENT: 38 minutes.    Enid Baasadhika Shabree Tebbetts M.D on 05/02/2018 at 11:32 AM  Between 7am to 6pm  - Pager - [phone]  After 6pm go to [email or web address] - Social research officer, governmentpassword EPAS [facility]  [facility] [place] [facility]  Office  [phone]  CC: Primary care physician; Keane PoliceNoorani, Sezmin S, MD   Note: This dictation was prepared with Dragon dictation along with smaller phrase technology. Any transcriptional errors that result from this process are unintentional.

## 2018-05-02 NOTE — Care Management Note (Signed)
Case Management Note  Patient Details  Name: Alexis Ray MRN: [id number] Date of Birth: May 25, 1955  Subjective/Objective:  Patient to be discharged per MD order. Orders in place for home health services. Patient open to [facility] for services. PT rec pulmonary rehab and patient will set that up through rehab dept during the week. Notified [facility] of discharge and resumption of care. Patient unhappy with her O2 company which is [employer]. Prefers to change to [facility]. I have informed [facility] of the desire to change companies and patient plans to call [employer] to cancel sometime during the week. No further needs, family to transport.                     Action/Plan:   Expected Discharge Date:  05/02/18               Expected Discharge Plan:  Home w Home Health Services  In-House Referral:     Discharge planning Services  CM Consult  Post Acute Care Choice:  Home Health Choice offered to:  Patient  DME Arranged:    DME Agency:     HH Arranged:  RN, PT, OT HH Agency:  [facility]  Status of Service:  Completed, signed off  If discussed at Long Length of Stay Meetings, dates discussed:    Additional Comments:  Virgel ManifoldJosh A Grete Bosko, RN 05/02/2018, 12:10 PM

## 2018-05-02 NOTE — Plan of Care (Signed)

## 2018-05-02 NOTE — Progress Notes (Signed)
Patient discharged home new oxygen tank provided for patient to transport home with, VSS, NAD, no concerns at this time, IV's and tele box removed, patient provided with prescription information, schedule and education.

## 2018-05-10 ENCOUNTER — Other Ambulatory Visit: Payer: Self-pay | Admitting: Registered Nurse

## 2018-05-10 DIAGNOSIS — M79604 Pain in right leg: Secondary | ICD-10-CM

## 2018-05-10 DIAGNOSIS — R1031 Right lower quadrant pain: Secondary | ICD-10-CM

## 2018-05-12 ENCOUNTER — Ambulatory Visit
Admission: RE | Admit: 2018-05-12 | Discharge: 2018-05-12 | Disposition: A | Discharge status: Home / Self Care | Payer: Medicare HMO | Source: Ambulatory Visit | Attending: Registered Nurse | Admitting: Registered Nurse

## 2018-05-12 DIAGNOSIS — M79604 Pain in right leg: Secondary | ICD-10-CM | POA: Diagnosis present

## 2018-05-12 DIAGNOSIS — R1031 Right lower quadrant pain: Secondary | ICD-10-CM | POA: Diagnosis present

## 2018-05-16 ENCOUNTER — Emergency Department: Payer: Medicare HMO

## 2018-05-16 ENCOUNTER — Encounter: Payer: Self-pay | Admitting: Emergency Medicine

## 2018-05-16 ENCOUNTER — Inpatient Hospital Stay
Admission: EM | Admit: 2018-05-16 | Discharge: 2018-06-05 | DRG: 207 | Disposition: E | Payer: Medicare HMO | Attending: Internal Medicine | Admitting: Internal Medicine

## 2018-05-16 DIAGNOSIS — G9340 Encephalopathy, unspecified: Secondary | ICD-10-CM | POA: Diagnosis not present

## 2018-05-16 DIAGNOSIS — F17210 Nicotine dependence, cigarettes, uncomplicated: Secondary | ICD-10-CM | POA: Diagnosis present

## 2018-05-16 DIAGNOSIS — I472 Ventricular tachycardia: Secondary | ICD-10-CM | POA: Diagnosis not present

## 2018-05-16 DIAGNOSIS — E1159 Type 2 diabetes mellitus with other circulatory complications: Secondary | ICD-10-CM

## 2018-05-16 DIAGNOSIS — Z833 Family history of diabetes mellitus: Secondary | ICD-10-CM

## 2018-05-16 DIAGNOSIS — J9621 Acute and chronic respiratory failure with hypoxia: Secondary | ICD-10-CM

## 2018-05-16 DIAGNOSIS — Z66 Do not resuscitate: Secondary | ICD-10-CM | POA: Diagnosis not present

## 2018-05-16 DIAGNOSIS — Z515 Encounter for palliative care: Secondary | ICD-10-CM

## 2018-05-16 DIAGNOSIS — N19 Unspecified kidney failure: Secondary | ICD-10-CM

## 2018-05-16 DIAGNOSIS — R570 Cardiogenic shock: Secondary | ICD-10-CM | POA: Diagnosis not present

## 2018-05-16 DIAGNOSIS — J181 Lobar pneumonia, unspecified organism: Secondary | ICD-10-CM | POA: Diagnosis present

## 2018-05-16 DIAGNOSIS — K219 Gastro-esophageal reflux disease without esophagitis: Secondary | ICD-10-CM | POA: Diagnosis present

## 2018-05-16 DIAGNOSIS — I493 Ventricular premature depolarization: Secondary | ICD-10-CM | POA: Diagnosis not present

## 2018-05-16 DIAGNOSIS — J9602 Acute respiratory failure with hypercapnia: Secondary | ICD-10-CM | POA: Diagnosis not present

## 2018-05-16 DIAGNOSIS — A4152 Sepsis due to Pseudomonas: Secondary | ICD-10-CM | POA: Diagnosis not present

## 2018-05-16 DIAGNOSIS — Z9981 Dependence on supplemental oxygen: Secondary | ICD-10-CM | POA: Diagnosis not present

## 2018-05-16 DIAGNOSIS — E1122 Type 2 diabetes mellitus with diabetic chronic kidney disease: Secondary | ICD-10-CM | POA: Diagnosis present

## 2018-05-16 DIAGNOSIS — Z794 Long term (current) use of insulin: Secondary | ICD-10-CM

## 2018-05-16 DIAGNOSIS — I5043 Acute on chronic combined systolic (congestive) and diastolic (congestive) heart failure: Secondary | ICD-10-CM | POA: Diagnosis present

## 2018-05-16 DIAGNOSIS — J9600 Acute respiratory failure, unspecified whether with hypoxia or hypercapnia: Secondary | ICD-10-CM | POA: Diagnosis present

## 2018-05-16 DIAGNOSIS — I130 Hypertensive heart and chronic kidney disease with heart failure and stage 1 through stage 4 chronic kidney disease, or unspecified chronic kidney disease: Secondary | ICD-10-CM | POA: Diagnosis present

## 2018-05-16 DIAGNOSIS — A413 Sepsis due to Hemophilus influenzae: Secondary | ICD-10-CM | POA: Diagnosis not present

## 2018-05-16 DIAGNOSIS — Z79899 Other long term (current) drug therapy: Secondary | ICD-10-CM

## 2018-05-16 DIAGNOSIS — J441 Chronic obstructive pulmonary disease with (acute) exacerbation: Secondary | ICD-10-CM | POA: Diagnosis present

## 2018-05-16 DIAGNOSIS — N183 Chronic kidney disease, stage 3 (moderate): Secondary | ICD-10-CM | POA: Diagnosis present

## 2018-05-16 DIAGNOSIS — Z7189 Other specified counseling: Secondary | ICD-10-CM

## 2018-05-16 DIAGNOSIS — T82598D Other mechanical complication of other cardiac and vascular devices and implants, subsequent encounter: Secondary | ICD-10-CM

## 2018-05-16 DIAGNOSIS — E669 Obesity, unspecified: Secondary | ICD-10-CM | POA: Diagnosis present

## 2018-05-16 DIAGNOSIS — J9601 Acute respiratory failure with hypoxia: Secondary | ICD-10-CM

## 2018-05-16 DIAGNOSIS — Z881 Allergy status to other antibiotic agents status: Secondary | ICD-10-CM

## 2018-05-16 DIAGNOSIS — E1151 Type 2 diabetes mellitus with diabetic peripheral angiopathy without gangrene: Secondary | ICD-10-CM | POA: Diagnosis present

## 2018-05-16 DIAGNOSIS — J440 Chronic obstructive pulmonary disease with acute lower respiratory infection: Secondary | ICD-10-CM | POA: Diagnosis present

## 2018-05-16 DIAGNOSIS — Z7982 Long term (current) use of aspirin: Secondary | ICD-10-CM

## 2018-05-16 DIAGNOSIS — I2510 Atherosclerotic heart disease of native coronary artery without angina pectoris: Secondary | ICD-10-CM | POA: Diagnosis present

## 2018-05-16 DIAGNOSIS — Z882 Allergy status to sulfonamides status: Secondary | ICD-10-CM

## 2018-05-16 DIAGNOSIS — Z90710 Acquired absence of both cervix and uterus: Secondary | ICD-10-CM

## 2018-05-16 DIAGNOSIS — I5023 Acute on chronic systolic (congestive) heart failure: Secondary | ICD-10-CM

## 2018-05-16 DIAGNOSIS — I739 Peripheral vascular disease, unspecified: Secondary | ICD-10-CM

## 2018-05-16 DIAGNOSIS — N179 Acute kidney failure, unspecified: Secondary | ICD-10-CM | POA: Diagnosis present

## 2018-05-16 DIAGNOSIS — R6521 Severe sepsis with septic shock: Secondary | ICD-10-CM | POA: Diagnosis not present

## 2018-05-16 DIAGNOSIS — Z8673 Personal history of transient ischemic attack (TIA), and cerebral infarction without residual deficits: Secondary | ICD-10-CM

## 2018-05-16 DIAGNOSIS — Z7951 Long term (current) use of inhaled steroids: Secondary | ICD-10-CM

## 2018-05-16 DIAGNOSIS — I429 Cardiomyopathy, unspecified: Secondary | ICD-10-CM | POA: Diagnosis present

## 2018-05-16 DIAGNOSIS — Z452 Encounter for adjustment and management of vascular access device: Secondary | ICD-10-CM

## 2018-05-16 DIAGNOSIS — R0602 Shortness of breath: Secondary | ICD-10-CM | POA: Diagnosis present

## 2018-05-16 DIAGNOSIS — J9622 Acute and chronic respiratory failure with hypercapnia: Secondary | ICD-10-CM

## 2018-05-16 DIAGNOSIS — R0989 Other specified symptoms and signs involving the circulatory and respiratory systems: Secondary | ICD-10-CM

## 2018-05-16 DIAGNOSIS — E872 Acidosis: Secondary | ICD-10-CM | POA: Diagnosis not present

## 2018-05-16 DIAGNOSIS — R140 Abdominal distension (gaseous): Secondary | ICD-10-CM

## 2018-05-16 DIAGNOSIS — Z6830 Body mass index (BMI) 30.0-30.9, adult: Secondary | ICD-10-CM

## 2018-05-16 DIAGNOSIS — Z888 Allergy status to other drugs, medicaments and biological substances status: Secondary | ICD-10-CM

## 2018-05-16 DIAGNOSIS — E875 Hyperkalemia: Secondary | ICD-10-CM | POA: Diagnosis not present

## 2018-05-16 LAB — COMPREHENSIVE METABOLIC PANEL
ALT: 42 U/L (ref 0–44)
AST: 38 U/L (ref 15–41)
Albumin: 3.8 g/dL (ref 3.5–5.0)
Alkaline Phosphatase: 83 U/L (ref 38–126)
Anion gap: 12 (ref 5–15)
BUN: 27 mg/dL — ABNORMAL HIGH (ref 8–23)
CO2: 23 mmol/L (ref 22–32)
Calcium: 8.5 mg/dL — ABNORMAL LOW (ref 8.9–10.3)
Chloride: 102 mmol/L (ref 98–111)
Creatinine, Ser: 1.78 mg/dL — ABNORMAL HIGH (ref 0.44–1.00)
GFR calc Af Amer: 35 mL/min — ABNORMAL LOW (ref >60)
GFR calc non Af Amer: 30 mL/min — ABNORMAL LOW (ref >60)
Glucose, Bld: 288 mg/dL — ABNORMAL HIGH (ref 70–99)
Potassium: 5.1 mmol/L (ref 3.5–5.1)
Sodium: 137 mmol/L (ref 135–145)
Total Bilirubin: 0.8 mg/dL (ref 0.3–1.2)
Total Protein: 7.5 g/dL (ref 6.5–8.1)

## 2018-05-16 LAB — CBC WITH DIFFERENTIAL/PLATELET
Abs Immature Granulocytes: 0.04 10*3/uL (ref 0.00–0.07)
Basophils Absolute: 0 10*3/uL (ref 0.0–0.1)
Basophils Relative: 0 %
EOS PCT: 0 %
Eosinophils Absolute: 0 10*3/uL (ref 0.0–0.5)
HCT: 40.1 % (ref 36.0–46.0)
Hemoglobin: 11.8 g/dL — ABNORMAL LOW (ref 12.0–15.0)
Immature Granulocytes: 0 %
LYMPHS PCT: 13 %
Lymphs Abs: 1.2 10*3/uL (ref 0.7–4.0)
MCH: 26.3 pg (ref 26.0–34.0)
MCHC: 29.4 g/dL — ABNORMAL LOW (ref 30.0–36.0)
MCV: 89.3 fL (ref 80.0–100.0)
Monocytes Absolute: 0.6 10*3/uL (ref 0.1–1.0)
Monocytes Relative: 6 %
Neutro Abs: 7.9 10*3/uL — ABNORMAL HIGH (ref 1.7–7.7)
Neutrophils Relative %: 81 %
Platelets: 162 10*3/uL (ref 150–400)
RBC: 4.49 MIL/uL (ref 3.87–5.11)
RDW: 17.2 % — ABNORMAL HIGH (ref 11.5–15.5)
WBC: 9.7 10*3/uL (ref 4.0–10.5)
nRBC: 0 % (ref 0.0–0.2)

## 2018-05-16 LAB — CG4 I-STAT (LACTIC ACID): Lactic Acid, Venous: 4.52 mmol/L (ref 0.5–1.9)

## 2018-05-16 LAB — TROPONIN I
Troponin I: 0.14 ng/mL (ref <0.03)
Troponin I: 0.14 ng/mL (ref <0.03)
Troponin I: 0.15 ng/mL (ref <0.03)

## 2018-05-16 LAB — BLOOD GAS, ARTERIAL
Acid-base deficit: 2.9 mmol/L — ABNORMAL HIGH (ref 0.0–2.0)
BICARBONATE: 23.7 mmol/L (ref 20.0–28.0)
FIO2: 0.4
MECHVT: 500 mL
O2 Saturation: 95.2 %
PEEP/CPAP: 5 cmH2O
Patient temperature: 37
RATE: 18 resp/min
pCO2 arterial: 47 mmHg (ref 32.0–48.0)
pH, Arterial: 7.31 — ABNORMAL LOW (ref 7.350–7.450)
pO2, Arterial: 84 mmHg (ref 83.0–108.0)

## 2018-05-16 LAB — BLOOD GAS, VENOUS
Acid-base deficit: 4.6 mmol/L — ABNORMAL HIGH (ref 0.0–2.0)
Bicarbonate: 24.6 mmol/L (ref 20.0–28.0)
FIO2: 0.6
O2 SAT: 28.7 %
PATIENT TEMPERATURE: 37
pCO2, Ven: 63 mmHg — ABNORMAL HIGH (ref 44.0–60.0)
pH, Ven: 7.2 — ABNORMAL LOW (ref 7.250–7.430)
pO2, Ven: <31 mmHg — CL (ref 32.0–45.0)

## 2018-05-16 LAB — GLUCOSE, CAPILLARY
GLUCOSE-CAPILLARY: 215 mg/dL — AB (ref 70–99)
Glucose-Capillary: 260 mg/dL — ABNORMAL HIGH (ref 70–99)
Glucose-Capillary: 218 mg/dL — ABNORMAL HIGH (ref 70–99)
Glucose-Capillary: 226 mg/dL — ABNORMAL HIGH (ref 70–99)

## 2018-05-16 LAB — BRAIN NATRIURETIC PEPTIDE: B Natriuretic Peptide: 3707 pg/mL — ABNORMAL HIGH (ref 0.0–100.0)

## 2018-05-16 LAB — PROCALCITONIN: PROCALCITONIN: 0.27 ng/mL

## 2018-05-16 LAB — MRSA PCR SCREENING: MRSA BY PCR: NEGATIVE

## 2018-05-16 LAB — LACTIC ACID, PLASMA
Lactic Acid, Venous: 2.3 mmol/L (ref 0.5–1.9)
Lactic Acid, Venous: 1.3 mmol/L (ref 0.5–1.9)
Lactic Acid, Venous: 1.2 mmol/L (ref 0.5–1.9)

## 2018-05-16 LAB — INFLUENZA PANEL BY PCR (TYPE A & B)
Influenza A By PCR: NEGATIVE
Influenza B By PCR: NEGATIVE

## 2018-05-16 MED ORDER — METHYLPREDNISOLONE SODIUM SUCC 125 MG IJ SOLR
60.0000 mg | Freq: Two times a day (BID) | INTRAMUSCULAR | Status: DC
  Administered 2018-05-16: 60 mg via INTRAVENOUS
  Filled 2018-05-16: qty 2

## 2018-05-16 MED ORDER — ALBUTEROL (5 MG/ML) CONTINUOUS INHALATION SOLN
10.0000 mg/h | INHALATION_SOLUTION | RESPIRATORY_TRACT | Status: DC
  Filled 2018-05-16: qty 20

## 2018-05-16 MED ORDER — DEXMEDETOMIDINE HCL IN NACL 400 MCG/100ML IV SOLN
0.4000 ug/kg/h | INTRAVENOUS | Status: DC
  Administered 2018-05-16 – 2018-05-17 (×4): 1.2 ug/kg/h via INTRAVENOUS
  Administered 2018-05-17: 1 ug/kg/h via INTRAVENOUS
  Filled 2018-05-16 (×6): qty 100

## 2018-05-16 MED ORDER — VANCOMYCIN HCL 10 G IV SOLR
2000.0000 mg | Freq: Once | INTRAVENOUS | Status: AC
  Administered 2018-05-16: 2000 mg via INTRAVENOUS
  Filled 2018-05-16: qty 2000

## 2018-05-16 MED ORDER — ACETAMINOPHEN 500 MG PO TABS
500.0000 mg | ORAL_TABLET | Freq: Four times a day (QID) | ORAL | Status: DC | PRN
  Administered 2018-05-17 – 2018-05-19 (×3): 500 mg via ORAL
  Filled 2018-05-16 (×3): qty 1

## 2018-05-16 MED ORDER — SUCCINYLCHOLINE CHLORIDE 20 MG/ML IJ SOLN
INTRAMUSCULAR | Status: AC | PRN
  Administered 2018-05-16: 120 mg via INTRAVENOUS

## 2018-05-16 MED ORDER — INSULIN GLARGINE 100 UNIT/ML ~~LOC~~ SOLN
45.0000 [IU] | Freq: Every day | SUBCUTANEOUS | Status: DC
  Administered 2018-05-16: 45 [IU] via SUBCUTANEOUS
  Filled 2018-05-16 (×2): qty 0.45

## 2018-05-16 MED ORDER — IPRATROPIUM-ALBUTEROL 0.5-2.5 (3) MG/3ML IN SOLN
3.0000 mL | Freq: Four times a day (QID) | RESPIRATORY_TRACT | Status: DC
  Administered 2018-05-16 – 2018-05-17 (×2): 3 mL via RESPIRATORY_TRACT
  Filled 2018-05-16 (×3): qty 3

## 2018-05-16 MED ORDER — PANTOPRAZOLE SODIUM 40 MG PO TBEC: 40.0000 mg | DELAYED_RELEASE_TABLET | Freq: Every day | ORAL | Status: DC

## 2018-05-16 MED ORDER — SODIUM CHLORIDE 0.9 % IV SOLN: 1.0000 g | Freq: Once | INTRAVENOUS | Status: DC

## 2018-05-16 MED ORDER — CARVEDILOL 6.25 MG PO TABS
6.2500 mg | ORAL_TABLET | Freq: Two times a day (BID) | ORAL | Status: DC
  Administered 2018-05-16 – 2018-05-17 (×2): 6.25 mg via ORAL
  Filled 2018-05-16 (×2): qty 1

## 2018-05-16 MED ORDER — ALBUTEROL SULFATE (2.5 MG/3ML) 0.083% IN NEBU: 2.5000 mg | INHALATION_SOLUTION | Freq: Four times a day (QID) | RESPIRATORY_TRACT | Status: DC | PRN

## 2018-05-16 MED ORDER — LORAZEPAM 2 MG/ML IJ SOLN
2.0000 mg | Freq: Once | INTRAMUSCULAR | Status: AC
  Administered 2018-05-16: 2 mg via INTRAVENOUS

## 2018-05-16 MED ORDER — SACUBITRIL-VALSARTAN 49-51 MG PO TABS: 1.0000 | ORAL_TABLET | Freq: Two times a day (BID) | ORAL | Status: DC

## 2018-05-16 MED ORDER — INSULIN ASPART 100 UNIT/ML ~~LOC~~ SOLN
0.0000 [IU] | Freq: Every day | SUBCUTANEOUS | Status: DC
  Administered 2018-05-16: 2 [IU] via SUBCUTANEOUS
  Filled 2018-05-16: qty 1

## 2018-05-16 MED ORDER — SPIRONOLACTONE 25 MG PO TABS: 25.0000 mg | ORAL_TABLET | Freq: Every day | ORAL | Status: DC

## 2018-05-16 MED ORDER — KETAMINE HCL 10 MG/ML IJ SOLN
15.0000 mg | Freq: Once | INTRAMUSCULAR | Status: DC
  Filled 2018-05-16: qty 1

## 2018-05-16 MED ORDER — ONDANSETRON HCL 4 MG/2ML IJ SOLN
4.0000 mg | Freq: Once | INTRAMUSCULAR | Status: AC
  Administered 2018-05-16: 4 mg via INTRAVENOUS

## 2018-05-16 MED ORDER — DULAGLUTIDE 1.5 MG/0.5ML ~~LOC~~ SOAJ: 1.5000 mg | SUBCUTANEOUS | Status: DC

## 2018-05-16 MED ORDER — FENTANYL CITRATE (PF) 100 MCG/2ML IJ SOLN
INTRAMUSCULAR | Status: AC | PRN
  Administered 2018-05-16: 100 ug via INTRAVENOUS

## 2018-05-16 MED ORDER — ONDANSETRON HCL 4 MG/2ML IJ SOLN: 4.0000 mg | Freq: Four times a day (QID) | INTRAMUSCULAR | Status: DC | PRN

## 2018-05-16 MED ORDER — INSULIN ASPART 100 UNIT/ML ~~LOC~~ SOLN
0.0000 [IU] | Freq: Three times a day (TID) | SUBCUTANEOUS | Status: DC
  Administered 2018-05-16: 3 [IU] via SUBCUTANEOUS
  Administered 2018-05-17: 2 [IU] via SUBCUTANEOUS
  Filled 2018-05-16 (×2): qty 1

## 2018-05-16 MED ORDER — MOMETASONE FURO-FORMOTEROL FUM 200-5 MCG/ACT IN AERO
2.0000 | INHALATION_SPRAY | Freq: Two times a day (BID) | RESPIRATORY_TRACT | Status: DC
  Filled 2018-05-16: qty 8.8

## 2018-05-16 MED ORDER — FUROSEMIDE 10 MG/ML IJ SOLN
40.0000 mg | Freq: Two times a day (BID) | INTRAMUSCULAR | Status: DC
  Administered 2018-05-16 – 2018-05-18 (×4): 40 mg via INTRAVENOUS
  Filled 2018-05-16 (×4): qty 4

## 2018-05-16 MED ORDER — ALBUTEROL SULFATE (2.5 MG/3ML) 0.083% IN NEBU
7.5000 mg | INHALATION_SOLUTION | Freq: Once | RESPIRATORY_TRACT | Status: AC
  Administered 2018-05-16: 7.5 mg via RESPIRATORY_TRACT

## 2018-05-16 MED ORDER — DEXMEDETOMIDINE HCL IN NACL 400 MCG/100ML IV SOLN: 0.4000 ug/kg/h | INTRAVENOUS | Status: DC

## 2018-05-16 MED ORDER — LORAZEPAM 2 MG/ML IJ SOLN
INTRAMUSCULAR | Status: AC
  Filled 2018-05-16: qty 1

## 2018-05-16 MED ORDER — FENTANYL 2500MCG IN NS 250ML (10MCG/ML) PREMIX INFUSION
0.0000 ug/h | INTRAVENOUS | Status: DC
  Administered 2018-05-16: 200 ug/h via INTRAVENOUS
  Administered 2018-05-17 – 2018-05-19 (×6): 250 ug/h via INTRAVENOUS
  Filled 2018-05-16 (×8): qty 250

## 2018-05-16 MED ORDER — ENOXAPARIN SODIUM 40 MG/0.4ML ~~LOC~~ SOLN
40.0000 mg | SUBCUTANEOUS | Status: DC
  Administered 2018-05-16: 40 mg via SUBCUTANEOUS
  Filled 2018-05-16: qty 0.4

## 2018-05-16 MED ORDER — FUROSEMIDE 10 MG/ML IJ SOLN
40.0000 mg | Freq: Once | INTRAMUSCULAR | Status: AC
  Administered 2018-05-16: 40 mg via INTRAVENOUS

## 2018-05-16 MED ORDER — ONDANSETRON HCL 4 MG/2ML IJ SOLN
INTRAMUSCULAR | Status: AC
  Filled 2018-05-16: qty 2

## 2018-05-16 MED ORDER — ASPIRIN EC 81 MG PO TBEC
81.0000 mg | DELAYED_RELEASE_TABLET | Freq: Every day | ORAL | Status: DC
  Administered 2018-05-16: 81 mg via ORAL
  Filled 2018-05-16: qty 1

## 2018-05-16 MED ORDER — ROSUVASTATIN CALCIUM 10 MG PO TABS
40.0000 mg | ORAL_TABLET | Freq: Every day | ORAL | Status: DC
  Administered 2018-05-16: 40 mg via ORAL
  Filled 2018-05-16: qty 4

## 2018-05-16 MED ORDER — ORAL CARE MOUTH RINSE
15.0000 mL | OROMUCOSAL | Status: DC
  Administered 2018-05-16 – 2018-05-20 (×34): 15 mL via OROMUCOSAL

## 2018-05-16 MED ORDER — FUROSEMIDE 10 MG/ML IJ SOLN
INTRAMUSCULAR | Status: AC
  Filled 2018-05-16: qty 4

## 2018-05-16 MED ORDER — ONDANSETRON HCL 4 MG PO TABS: 4.0000 mg | ORAL_TABLET | Freq: Four times a day (QID) | ORAL | Status: DC | PRN

## 2018-05-16 MED ORDER — METHYLPREDNISOLONE SODIUM SUCC 125 MG IJ SOLR
125.0000 mg | Freq: Once | INTRAMUSCULAR | Status: AC
  Administered 2018-05-16: 125 mg via INTRAVENOUS
  Filled 2018-05-16: qty 2

## 2018-05-16 MED ORDER — CHLORHEXIDINE GLUCONATE 0.12% ORAL RINSE (MEDLINE KIT)
15.0000 mL | Freq: Two times a day (BID) | OROMUCOSAL | Status: DC
  Administered 2018-05-16 – 2018-05-20 (×8): 15 mL via OROMUCOSAL

## 2018-05-16 MED ORDER — VENLAFAXINE HCL ER 75 MG PO CP24
225.0000 mg | ORAL_CAPSULE | Freq: Every day | ORAL | Status: DC
  Administered 2018-05-16: 225 mg via ORAL
  Filled 2018-05-16: qty 3

## 2018-05-16 MED ORDER — PANTOPRAZOLE SODIUM 40 MG PO PACK
40.0000 mg | PACK | Freq: Every day | ORAL | Status: DC
  Administered 2018-05-17 – 2018-05-18 (×2): 40 mg
  Filled 2018-05-16 (×2): qty 20

## 2018-05-16 MED ORDER — KETAMINE HCL 10 MG/ML IJ SOLN
INTRAMUSCULAR | Status: AC | PRN
  Administered 2018-05-16: 150 mg via INTRAVENOUS

## 2018-05-16 MED ORDER — METFORMIN HCL 500 MG PO TABS
500.0000 mg | ORAL_TABLET | Freq: Two times a day (BID) | ORAL | Status: DC
  Administered 2018-05-16 – 2018-05-17 (×2): 500 mg via ORAL
  Filled 2018-05-16 (×3): qty 1

## 2018-05-16 MED ORDER — IPRATROPIUM BROMIDE 0.02 % IN SOLN
0.5000 mg | Freq: Once | RESPIRATORY_TRACT | Status: DC
  Filled 2018-05-16: qty 2.5

## 2018-05-16 MED ORDER — ENOXAPARIN SODIUM 40 MG/0.4ML ~~LOC~~ SOLN: 40.0000 mg | SUBCUTANEOUS | Status: DC

## 2018-05-16 MED ORDER — FLUTICASONE PROPIONATE 50 MCG/ACT NA SUSP
1.0000 | Freq: Two times a day (BID) | NASAL | Status: DC
  Filled 2018-05-16: qty 16

## 2018-05-16 NOTE — Progress Notes (Signed)
Pt arrived to the unit intubated and sedated, pt noted to be a confidential "break the glass" pt. I spoke with Selena Batten, RN, who had handed off pt care to me from the ED, and she was unaware as to why the pt is confidential. Pt's daughter noted to be in the room wanting an update on the pt's condition. Daughter stated that "someone downstairs told me she was in ICU room 4". Elnita Maxwell, [facility] was contacted and she stated that since the pt is intubated and sedated that someone would need to be able to make decisions for the pt and we needed to find out how she became confidential. I spoke with Marcelino Duster, the registrar in the ED, and she stated that pt would need to submit a request in writing to have the confidentiality removed and there was no indication in pt's chart suggesting why the pt is confidential. Since the pt is unable to write at this time, Marcelino Duster suggested that I reach out to IT. Pt's daughter in room at this time and she has informed other family members of pt's location. The unit is open freely to visitors at this time, so pt's status is no longer confidential. I spoke with Casimiro Needle in IT, Casimiro Needle stated that he is seeing that the pt herself requested to be confidential. Casimiro Needle stated that pt would need to submit a request in writing to have the confidentiality removed. Casimiro Needle stated that he was going to fill out an incident report and send it to his supervisors in order to go forward on how best to take care of the pt who cannot make decisions for herself at this time. IT department expected to follow up with me in the next few minutes.

## 2018-05-16 NOTE — ED Triage Notes (Signed)
Pt to ED by EMS with c/o of SOB. Upon fire dept arrival 61% on nasal cannula. Upon EMS arrival 72%. Pt placed on cpap with highest reading at 90%. Pt requested removal of cpap. Upon arrival pt at 81% on 6L [place].

## 2018-05-16 NOTE — Progress Notes (Signed)
Peggy from IT followed up and stated that she is unaware how the pt became a confidential pt and she is going to research the event. Peggy stated she would continue to follow up with me and get the ethics committee involved if needed.

## 2018-05-16 NOTE — H&P (Signed)
[facility] - [place] [facility]   PATIENT NAME: Alexis GaribaldiDebra Ray    MR#:  [id number]  DATE OF BIRTH:  October 20, 1955  DATE OF ADMISSION:  04/01/2019  PRIMARY CARE PHYSICIAN: Keane PoliceNoorani, Sezmin S, MD   REQUESTING/REFERRING PHYSICIAN: Dr. Ileana RoupJames McShane  CHIEF COMPLAINT:   Chief Complaint  Patient presents with  . Shortness of Breath    HISTORY OF PRESENT ILLNESS:  Alexis Ray  is a 63 y.o. female with a known history of chronic respiratory failure secondary to COPD with ongoing smoking on 4 L home oxygen, chronic systolic heart failure with last known ejection fraction less than 15%, diabetes mellitus, hypertension and GERD presents to hospital secondary to worsening shortness of breath. Patient is currently intubated and unable to provide history.  Most of the history is obtained from EMS notes and previous records. Patient was recently discharged from the hospital 2 weeks ago.  At the time she presented with similar symptoms and needed BiPAP and did not get intubated.  EMS was called this morning as patient was complaining of significant shortness of breath since last night.  She was noted to have 60% oxygen saturations on arrival.  She was placed on CPAP in route however patient was confused and started pulling off.  They tried BiPAP in the ED but her respiratory rate was very high so she needed to be intubated.  She is resting on Precedex at this time.  Blood pressure is stable.  Lungs with scant breath sounds.  BNP is greater than 3700.  Lactic acid is significantly elevated.  She is being admitted to the ICU.  PAST MEDICAL HISTORY:   Past Medical History:  Diagnosis Date  . CAD (coronary artery disease)   . Chronic combined systolic and diastolic CHF (congestive heart failure) (HCC)   . COPD (chronic obstructive pulmonary disease) (HCC)   . Diabetes mellitus without complication (HCC)   . GERD (gastroesophageal reflux disease)   . Hypertension   . PVD (peripheral  vascular disease) (HCC)   . Stroke (cerebrum) (HCC)     PAST SURGICAL HISTORY:   Past Surgical History:  Procedure Laterality Date  . ABDOMINAL HYSTERECTOMY    . CARDIAC CATHETERIZATION    . COLONOSCOPY    . EYE SURGERY Bilateral   . LEFT HEART CATH AND CORONARY ANGIOGRAPHY N/A 04/08/2018   Procedure: LEFT HEART CATH AND CORONARY ANGIOGRAPHY;  Surgeon: Laurier NancyKhan, Shaukat A, MD;  Location: [facility] INVASIVE CV LAB;  Service: Cardiovascular;  Laterality: N/A;    SOCIAL HISTORY:   Social History   Tobacco Use  . Smoking status: Current Every Day Smoker    Packs/day: 1.00    Types: Cigarettes  . Smokeless tobacco: Never Used  Substance Use Topics  . Alcohol use: No    Alcohol/week: 0.0 standard drinks    FAMILY HISTORY:   Family History  Problem Relation Age of Onset  . Diabetes Mother   . Cancer Father   . Diabetes Maternal Aunt   . Diabetes Maternal Aunt     DRUG ALLERGIES:   Allergies  Allergen Reactions  . Colchicine     itching  . Sulfa Antibiotics Rash    itching  . Levaquin [Levofloxacin In D5w] Rash    REVIEW OF SYSTEMS:   Review of Systems  Unable to perform ROS: Critical illness    MEDICATIONS AT HOME:   Prior to Admission medications   Medication Sig Start Date End Date Taking? Authorizing Provider  acetaminophen (TYLENOL) 500 MG  tablet Take 1 tablet by mouth every 6 (six) hours as needed for mild pain or fever.  04/23/18  Yes [provider]  albuterol (PROVENTIL) (2.5 MG/3ML) 0.083% nebulizer solution Take 3 mLs (2.5 mg total) by nebulization every 6 (six) hours as needed for wheezing or shortness of breath. 05/26/15  Yes Altamese DillingVachhani, Vaibhavkumar, MD  albuterol (VENTOLIN HFA) 108 (90 Base) MCG/ACT inhaler Inhale 2 puffs into the lungs every 6 (six) hours as needed for wheezing or shortness of breath.    Yes [provider]  aspirin EC 81 MG tablet Take 81 mg by mouth daily.   Yes [provider]  budesonide-formoterol (SYMBICORT)  160-4.5 MCG/ACT inhaler Inhale 2 puffs into the lungs 2 (two) times daily.   Yes [provider]  carvedilol (COREG) 6.25 MG tablet Take 6.25 mg by mouth 2 (two) times daily with a meal.   Yes [provider]  cholecalciferol (VITAMIN D3) 25 MCG (1000 UT) tablet Take 1,000 Units by mouth daily.   Yes [provider]  cilostazol (PLETAL) 100 MG tablet Take 100 mg by mouth 2 (two) times daily.   Yes [provider]  Dulaglutide 1.5 MG/0.5ML SOPN Inject 1.5 mg into the skin every Monday.   Yes [provider]  esomeprazole (NEXIUM) 40 MG capsule Take 40 mg by mouth daily.    Yes [provider]  ferrous sulfate 325 (65 FE) MG tablet Take 325 mg by mouth daily with breakfast.   Yes [provider]  fluticasone (FLONASE) 50 MCG/ACT nasal spray Place 1 spray into both nostrils 2 (two) times daily.   Yes [provider]  furosemide (LASIX) 40 MG tablet Take 1 tablet (40 mg total) by mouth daily. 04/09/18  Yes Mody, Patricia PesaSital, MD  guaiFENesin (MUCINEX) 600 MG 12 hr tablet Take 1 tablet (600 mg total) by mouth 2 (two) times daily. 05/02/18  Yes Enid BaasKalisetti, Wright Gravely, MD  insulin aspart (NOVOLOG) 100 UNIT/ML injection Inject 10 Units into the skin 3 (three) times daily with meals.   Yes [provider]  insulin glargine (LANTUS) 100 UNIT/ML injection Inject 45 Units into the skin at bedtime.    Yes [provider]  levocetirizine (XYZAL) 5 MG tablet Take 5 mg by mouth at bedtime.    Yes [provider]  metFORMIN (GLUCOPHAGE) 500 MG tablet Take 500 mg by mouth 2 (two) times daily with a meal.    Yes [provider]  Multiple Vitamins-Minerals (MULTIVITAMIN ADULT) TABS Take 1 tablet by mouth daily.   Yes [provider]  nitroGLYCERIN (NITROSTAT) 0.4 MG SL tablet Place 0.4 mg under the tongue every 5 (five) minutes as needed for chest pain.   Yes [provider]  pramipexole (MIRAPEX) 0.125 MG  tablet Take 0.125 mg by mouth at bedtime.    Yes [provider]  pregabalin (LYRICA) 150 MG capsule Take 150 mg by mouth 3 (three) times daily.    Yes [provider]  rosuvastatin (CRESTOR) 40 MG tablet Take 40 mg by mouth daily.   Yes [provider]  sacubitril-valsartan (ENTRESTO) 49-51 MG Take 1 tablet by mouth 2 (two) times daily.   Yes [provider]  spironolactone (ALDACTONE) 25 MG tablet Take 25 mg by mouth daily.    Yes [provider]  Tiotropium Bromide Monohydrate (SPIRIVA RESPIMAT) 2.5 MCG/ACT AERS Inhale 2 puffs into the lungs daily.   Yes [provider]  Venlafaxine HCl 225 MG TB24 Take 225 mg by mouth  daily.    Yes [provider]  vitamin C (ASCORBIC ACID) 500 MG tablet Take 500 mg by mouth daily.   Yes [provider]      VITAL SIGNS:  Blood pressure (!) 133/92, pulse (!) 102, temperature 97.9 F (36.6 C), temperature source Axillary, resp. rate (!) 41, weight 86.2 kg, SpO2 98 %.  PHYSICAL EXAMINATION:   Physical Exam  GENERAL:  63 y.o.-year-old patient lying in the bed, intubated, critically ill appearing EYES: Pupils equal, round, reactive to light and accommodation. No scleral icterus. Extraocular muscles intact.  HEENT: Head atraumatic, normocephalic. Oropharynx and nasopharynx clear.  NECK:  Supple, no jugular venous distention. No thyroid enlargement, no tenderness.  LUNGS: Scant breath sounds bilaterally, bibasilar scattered wheezes heard.  No rails or rhonchi.  Using accessory muscles to breathe in spite of being on the ventilator CARDIOVASCULAR: S1, S2 normal. No murmurs, rubs, or gallops.  ABDOMEN: Soft, obese, nontender, nondistended. Bowel sounds present. No organomegaly or mass.  EXTREMITIES: No pedal edema, cyanosis, or clubbing.  NEUROLOGIC: is sedated.,  Unable to do a complete neuro exam PSYCHIATRIC: The patient is sedated.  SKIN: No obvious rash, lesion, or ulcer.    LABORATORY PANEL:   CBC Recent Labs  Lab 05/22/2018 1111  WBC 9.7  HGB 11.8*  HCT 40.1  PLT 162   ------------------------------------------------------------------------------------------------------------------  Chemistries  Recent Labs  Lab 05/18/2018 1111  NA 137  K 5.1  CL 102  CO2 23  GLUCOSE 288*  BUN 27*  CREATININE 1.78*  CALCIUM 8.5*  AST 38  ALT 42  ALKPHOS 83  BILITOT 0.8   ------------------------------------------------------------------------------------------------------------------  Cardiac Enzymes Recent Labs  Lab 06/04/2018 1111  TROPONINI 0.14*   ------------------------------------------------------------------------------------------------------------------  RADIOLOGY:  Dg Chest Portable 1 View  Result Date: 05/15/2018 CLINICAL DATA:  Endotracheal tube placement. EXAM: PORTABLE CHEST 1 VIEW COMPARISON:  Radiograph of same day. FINDINGS: Stable cardiomegaly. Atherosclerosis of thoracic aorta is noted. Endotracheal tube is in grossly good position. Nasogastric tube is seen entering the stomach with tip in proximal stomach. No pneumothorax is noted. Bilateral diffuse interstitial densities are noted concerning for pulmonary edema. Small left pleural effusion may be present. Bony thorax is unremarkable. IMPRESSION: Stable cardiomegaly. Endotracheal and nasogastric tubes are in grossly good position. Stable bilateral pulmonary edema is noted with possible left pleural effusion. Aortic Atherosclerosis (ICD10-I70.0). Electronically Signed   By: Lupita Raider, M.D.   On: 05/15/2018 12:38   Dg Chest Portable 1 View  Result Date: 05/17/2018 CLINICAL DATA:  Shortness of breath. EXAM: PORTABLE CHEST 1 VIEW COMPARISON:  Chest radiograph 05/01/2018 FINDINGS: Monitoring leads overlie the patient. Stable cardiomegaly. Diffuse bilateral interstitial pulmonary opacities. Interval increase in bilateral mid and lower lung heterogeneous opacities. Probable tiny pleural  effusions. IMPRESSION: Cardiomegaly with mild interstitial edema. Increase in basilar opacities may represent superimposed atelectasis, infection or focal edema. Electronically Signed   By: Annia Belt M.D.   On: 05/15/2018 11:59    EKG:   Orders placed or performed during the hospital encounter of 05/25/2018  . ED EKG  . ED EKG  . EKG 12-Lead  . EKG 12-Lead    IMPRESSION AND PLAN:   Kamika Pirrello  is a 63 y.o. female with a known history of chronic respiratory failure secondary to COPD with ongoing smoking on 4 L home oxygen, chronic systolic heart failure with last known ejection fraction less than 15%, diabetes mellitus, hypertension and GERD presents to hospital secondary to worsening shortness of breath.  1.  Acute hypoxic respiratory failure-secondary to COPD exacerbation and CHF exacerbation -Intubated, currently on ventilator -Admit to ICU, intensivist consult for vent management -Started on IV steroids, inhalers/nebulizers. -No infiltrate noted on chest x-Ray though lactic acid is elevated.  Could be secondary to hypoxia on arrival.  Repeat lactic acid later.  WBC within normal limits.  Received antibiotics in the ED.  We will hold off on continuing antibiotics at this time.  2.  Acute on chronic CHF exacerbation-systolic dysfunction with EF less than 15% -Cardiology consulted.  Started on IV Lasix and monitor BNP -Continue cardiac medications including aspirin, statin, Coreg, aldactone and Entresto  3.  CKD stage III-creatinine seems to be stable.  Monitor while on IV Lasix  4.  Diabetes mellitus-continue home medications.  Monitor with sliding scale insulin especially while on steroids.  5.  GERD-Protonix  6.  DVT prophylaxis-on Lovenox   Palliative care consulted due to recurrent admissions    All the records are reviewed and case discussed with ED provider. Management plans discussed with the patient, family and they are in agreement.  CODE STATUS: Full  code  TOTAL CRITICAL CARE TIME SPENT IN TAKING CARE OF THIS PATIENT: 58 minutes.    Enid Baas M.D on 05/24/2018 at 12:48 PM  Between 7am to 6pm - Pager - [phone]  After 6pm go to [email or web address] - Social research officer, government  [facility]  Office  [phone]  CC: Primary care physician; Keane Police, MD

## 2018-05-16 NOTE — ED Provider Notes (Signed)
[facility] Emergency Department Provider Note  ____________________________________________   I have reviewed the triage vital signs and the nursing notes. Where available I have reviewed prior notes and, if possible and indicated, outside hospital notes.    HISTORY  Chief Complaint Shortness of Breath    HPI Alexis TWADDELL is a 63 y.o. female with an EF of about 15%, was recently sent home on oxygen from an admission to this hospital after diuresis, states that over the last couple days she is beginning more short of breath.  EMS found her with a sat of 61 on her home oxygen, they placed her on BiPAP, she had trouble tolerating the BiPAP and appeared fatigued, she arrives off of BiPAP, her sats did improve into the mid 90s with BiPAP.  Patient has had no chest pain she states.  Very limited history she is quite agitated initially and then very sleepy.  Level 5 chart caveat; no further history available due to patient status.    Past Medical History:  Diagnosis Date  . CAD (coronary artery disease)   . Chronic combined systolic and diastolic CHF (congestive heart failure) (HCC)   . COPD (chronic obstructive pulmonary disease) (HCC)   . Diabetes mellitus without complication (HCC)   . GERD (gastroesophageal reflux disease)   . Hypertension   . PVD (peripheral vascular disease) (HCC)   . Stroke (cerebrum) [facility])     Patient Active Problem List   Diagnosis Date Noted  . Acute respiratory failure (HCC) 05/30/2018  . Acute respiratory failure with hypoxia (HCC) 04/29/2018  . Acute on chronic systolic CHF (congestive heart failure) (HCC) 04/06/2018  . HCAP (healthcare-associated pneumonia) 06/05/2016  . Acute on chronic systolic CHF (congestive heart failure), NYHA class 4 (HCC) 05/29/2016  . Chronic systolic heart failure (HCC) 06/07/2015  . Numbness in both legs 06/07/2015  . Tobacco use 06/07/2015  . COPD with acute exacerbation (HCC) 05/23/2015  . GERD  (gastroesophageal reflux disease) 05/23/2015  . Type 2 diabetes mellitus (HCC) 05/23/2015  . HTN (hypertension) 05/23/2015  . PVD (peripheral vascular disease) (HCC) 05/23/2015  . OSA on CPAP 05/23/2015    Past Surgical History:  Procedure Laterality Date  . ABDOMINAL HYSTERECTOMY    . CARDIAC CATHETERIZATION    . COLONOSCOPY    . EYE SURGERY Bilateral   . LEFT HEART CATH AND CORONARY ANGIOGRAPHY N/A 04/08/2018   Procedure: LEFT HEART CATH AND CORONARY ANGIOGRAPHY;  Surgeon: Laurier Nancy, MD;  Location: [facility] INVASIVE CV LAB;  Service: Cardiovascular;  Laterality: N/A;    Prior to Admission medications   Medication Sig Start Date End Date Taking? Authorizing Provider  acetaminophen (TYLENOL) 500 MG tablet Take 1 tablet by mouth every 6 (six) hours as needed for mild pain or fever.  04/23/18  Yes [provider]  albuterol (PROVENTIL) (2.5 MG/3ML) 0.083% nebulizer solution Take 3 mLs (2.5 mg total) by nebulization every 6 (six) hours as needed for wheezing or shortness of breath. 05/26/15  Yes Altamese Dilling, MD  albuterol (VENTOLIN HFA) 108 (90 Base) MCG/ACT inhaler Inhale 2 puffs into the lungs every 6 (six) hours as needed for wheezing or shortness of breath.    Yes [provider]  aspirin EC 81 MG tablet Take 81 mg by mouth daily.   Yes [provider]  budesonide-formoterol (SYMBICORT) 160-4.5 MCG/ACT inhaler Inhale 2 puffs into the lungs 2 (two) times daily.   Yes [provider]  carvedilol (COREG) 6.25 MG tablet Take 6.25 mg  by mouth 2 (two) times daily with a meal.   Yes [provider]  cholecalciferol (VITAMIN D3) 25 MCG (1000 UT) tablet Take 1,000 Units by mouth daily.   Yes [provider]  cilostazol (PLETAL) 100 MG tablet Take 100 mg by mouth 2 (two) times daily.   Yes [provider]  Dulaglutide 1.5 MG/0.5ML SOPN Inject 1.5 mg into the skin every Monday.   Yes [provider]  esomeprazole  (NEXIUM) 40 MG capsule Take 40 mg by mouth daily.    Yes [provider]  ferrous sulfate 325 (65 FE) MG tablet Take 325 mg by mouth daily with breakfast.   Yes [provider]  fluticasone (FLONASE) 50 MCG/ACT nasal spray Place 1 spray into both nostrils 2 (two) times daily.   Yes [provider]  furosemide (LASIX) 40 MG tablet Take 1 tablet (40 mg total) by mouth daily. 04/09/18  Yes Mody, Patricia Pesa, MD  guaiFENesin (MUCINEX) 600 MG 12 hr tablet Take 1 tablet (600 mg total) by mouth 2 (two) times daily. 05/02/18  Yes Enid Baas, MD  insulin aspart (NOVOLOG) 100 UNIT/ML injection Inject 10 Units into the skin 3 (three) times daily with meals.   Yes [provider]  insulin glargine (LANTUS) 100 UNIT/ML injection Inject 45 Units into the skin at bedtime.    Yes [provider]  levocetirizine (XYZAL) 5 MG tablet Take 5 mg by mouth at bedtime.    Yes [provider]  metFORMIN (GLUCOPHAGE) 500 MG tablet Take 500 mg by mouth 2 (two) times daily with a meal.    Yes [provider]  Multiple Vitamins-Minerals (MULTIVITAMIN ADULT) TABS Take 1 tablet by mouth daily.   Yes [provider]  nitroGLYCERIN (NITROSTAT) 0.4 MG SL tablet Place 0.4 mg under the tongue every 5 (five) minutes as needed for chest pain.   Yes [provider]  pramipexole (MIRAPEX) 0.125 MG tablet Take 0.125 mg by mouth at bedtime.    Yes [provider]  pregabalin (LYRICA) 150 MG capsule Take 150 mg by mouth 3 (three) times daily.    Yes [provider]  rosuvastatin (CRESTOR) 40 MG tablet Take 40 mg by mouth daily.   Yes [provider]  sacubitril-valsartan (ENTRESTO) 49-51 MG Take 1 tablet by mouth 2 (two) times daily.   Yes [provider]  spironolactone (ALDACTONE) 25 MG tablet Take 25 mg by mouth daily.    Yes [provider]  Tiotropium Bromide Monohydrate (SPIRIVA RESPIMAT) 2.5 MCG/ACT AERS Inhale  2 puffs into the lungs daily.   Yes [provider]  Venlafaxine HCl 225 MG TB24 Take 225 mg by mouth daily.    Yes [provider]  vitamin C (ASCORBIC ACID) 500 MG tablet Take 500 mg by mouth daily.   Yes [provider]    Allergies Colchicine; Sulfa antibiotics; and Levaquin [levofloxacin in d5w]  Family History  Problem Relation Age of Onset  . Diabetes Mother   . Cancer Father   . Diabetes Maternal Aunt   . Diabetes Maternal Aunt     Social History Social History   Tobacco Use  . Smoking status: Current Every Day Smoker    Packs/day: 1.00    Types: Cigarettes  . Smokeless tobacco: Never Used  Substance Use Topics  . Alcohol use: No    Alcohol/week: 0.0 standard drinks  . Drug use: No    Review of Systems Level 5 chart caveat; no further history available  due to patient status.   ____________________________________________   PHYSICAL EXAM:  VITAL SIGNS: ED Triage Vitals  Enc Vitals Group     BP 06/04/2018 1130 138/87     Pulse Rate 05/10/2018 1130 (!) 43     Resp 05/19/2018 1130 (!) 41     Temp 05/11/2018 1130 97.9 F (36.6 C)     Temp Source 05/28/2018 1130 Axillary     SpO2 05/19/2018 1115 97 %     Weight 05/17/2018 1137 190 lb (86.2 kg)     Height --      Head Circumference --      Peak Flow --      Pain Score --      Pain Loc --      Pain Edu? --      Excl. in GC? --     Constitutional: Initially, patient is very agitated, afterwards she is somnolent she is able to answer questions with one-word dyspnea, she is in acute and significant respiratory distress Eyes: Conjunctivae are normal Head: Atraumatic HEENT: No congestion/rhinnorhea. Mucous membranes are moist.  Oropharynx non-erythematous Neck:   Nontender with no meningismus, no masses, no stridor Cardiovascular: Tachycardia noted regular rhythm. Grossly normal heart sounds.  Good peripheral circulation. Respiratory: Rales, diminished in the bases increased work of breathing  increased respiratory rate Abdominal: Soft and nontender. No distention. No guarding no rebound Back:  There is no focal tenderness or step off.  there is no midline tenderness there are no lesions noted. there is no CVA tenderness Musculoskeletal: No lower extremity tenderness, no upper extremity tenderness. No joint effusions, no DVT signs strong distal pulses no edema Neurologic:  Normal speech and language. No gross focal neurologic deficits are appreciated.  Skin:  Skin is warm, dry and intact. No rash noted. Psychiatric: Mood and affect are anxious. Speech and behavior are normal.  ____________________________________________   LABS (all labs ordered are listed, but only abnormal results are displayed)  Labs Reviewed  COMPREHENSIVE METABOLIC PANEL - Abnormal; Notable for the following components:      Result Value   Glucose, Bld 288 (*)    BUN 27 (*)    Creatinine, Ser 1.78 (*)    Calcium 8.5 (*)    GFR calc non Af Amer 30 (*)    GFR calc Af Amer 35 (*)    All other components within normal limits  CBC WITH DIFFERENTIAL/PLATELET - Abnormal; Notable for the following components:   Hemoglobin 11.8 (*)    MCHC 29.4 (*)    RDW 17.2 (*)    Neutro Abs 7.9 (*)    All other components within normal limits  TROPONIN I - Abnormal; Notable for the following components:   Troponin I 0.14 (*)    All other components within normal limits  BRAIN NATRIURETIC PEPTIDE - Abnormal; Notable for the following components:   B Natriuretic Peptide 3,707.0 (*)    All other components within normal limits  BLOOD GAS, VENOUS - Abnormal; Notable for the following components:   pH, Ven 7.20 (*)    pCO2, Ven 63 (*)    pO2, Ven <31.0 (*)    Acid-base deficit 4.6 (*)    All other components within normal limits  GLUCOSE, CAPILLARY - Abnormal; Notable for the following components:   Glucose-Capillary 260 (*)    All other components within normal limits  CG4 I-STAT (LACTIC ACID) - Abnormal; Notable  for the following components:   Lactic Acid, Venous 4.52 (*)  All other components within normal limits  CULTURE, BLOOD (ROUTINE X 2)  CULTURE, BLOOD (ROUTINE X 2)  INFLUENZA PANEL BY PCR (TYPE A & B)  LACTIC ACID, PLASMA  LACTIC ACID, PLASMA  PROCALCITONIN    Pertinent labs  results that were available during my care of the patient were reviewed by me and considered in my medical decision making (see chart for details). ____________________________________________  EKG  I personally interpreted any EKGs ordered by me or triage Respiratory baseline wander noted, sinus tach, rate 112, no acute ST elevation or depression, limited EKG by baseline artifact ____________________________________________  RADIOLOGY  Pertinent labs & imaging results that were available during my care of the patient were reviewed by me and considered in my medical decision making (see chart for details). If possible, patient and/or family made aware of any abnormal findings.  Dg Chest Portable 1 View  Result Date: 05/08/2018 CLINICAL DATA:  Endotracheal tube placement. EXAM: PORTABLE CHEST 1 VIEW COMPARISON:  Radiograph of same day. FINDINGS: Stable cardiomegaly. Atherosclerosis of thoracic aorta is noted. Endotracheal tube is in grossly good position. Nasogastric tube is seen entering the stomach with tip in proximal stomach. No pneumothorax is noted. Bilateral diffuse interstitial densities are noted concerning for pulmonary edema. Small left pleural effusion may be present. Bony thorax is unremarkable. IMPRESSION: Stable cardiomegaly. Endotracheal and nasogastric tubes are in grossly good position. Stable bilateral pulmonary edema is noted with possible left pleural effusion. Aortic Atherosclerosis (ICD10-I70.0). Electronically Signed   By: Lupita Raider, M.D.   On: 05/15/2018 12:38   Dg Chest Portable 1 View  Result Date: 05/26/2018 CLINICAL DATA:  Shortness of breath. EXAM: PORTABLE CHEST 1 VIEW  COMPARISON:  Chest radiograph 05/01/2018 FINDINGS: Monitoring leads overlie the patient. Stable cardiomegaly. Diffuse bilateral interstitial pulmonary opacities. Interval increase in bilateral mid and lower lung heterogeneous opacities. Probable tiny pleural effusions. IMPRESSION: Cardiomegaly with mild interstitial edema. Increase in basilar opacities may represent superimposed atelectasis, infection or focal edema. Electronically Signed   By: Annia Belt M.D.   On: 06/02/2018 11:59   ____________________________________________    PROCEDURES  Procedure(s) performed: None  Procedure Name: Intubation Date/Time: 06/02/2018 1:37 PM Performed by: Jeanmarie Plant, MD Pre-anesthesia Checklist: Patient identified, Patient being monitored, Emergency Drugs available, Timeout performed and Suction available Oxygen Delivery Method: Non-rebreather mask Preoxygenation: Pre-oxygenation with 100% oxygen Induction Type: Rapid sequence Ventilation: Mask ventilation without difficulty Laryngoscope Size: Glidescope and 4 Grade View: Grade II Tube size: 7.5 mm Number of attempts: 1 Airway Equipment and Method: Video-laryngoscopy Placement Confirmation: ETT inserted through vocal cords under direct vision,  CO2 detector and Breath sounds checked- equal and bilateral Secured at: 22 cm Tube secured with: ETT holder       Critical Care performed: CRITICAL CARE Performed by: Jeanmarie Plant   Total critical care time: 55 minutes  Critical care time was exclusive of separately billable procedures and treating other patients.  Critical care was necessary to treat or prevent imminent or life-threatening deterioration.  Critical care was time spent personally by me on the following activities: development of treatment plan with patient and/or surrogate as well as nursing, discussions with consultants, evaluation of patient's response to treatment, examination of patient, obtaining history from patient  or surrogate, ordering and performing treatments and interventions, ordering and review of laboratory studies, ordering and review of radiographic studies, pulse oximetry and re-evaluation of patient's condition.   ____________________________________________   INITIAL IMPRESSION / ASSESSMENT AND PLAN / ED COURSE  Pertinent labs &  imaging results that were available during my care of the patient were reviewed by me and considered in my medical decision making (see chart for details).  Patient presented in severe respiratory distress, we were able to get her to tolerate the BiPAP but she was quite fatigued, I was worried that she was still retaining CO2 her work of breathing remains present and she became gradually more obtunded concern therefore for 10 CO2 existed.  I did immediately treat her with Solu-Medrol for history of COPD and continuous neb for history of COPD, and Lasix given her history of heart failure.  I initially gave only 40 of Lasix as in the past she has dropped her pressure with Lasix and I certainly did not want to give her fluid boluses as long as I can attain diuretic fluid moving it does not really matter.  Patient unfortunately did require intubation.  I did explain to her the procedure prior to doing it.  Tolerated intubation very well, see procedure note.  I then placed her on Precedex, as I did not want to drop her blood pressure.  Patient did require some additional boluses of other sedating medications but now we do seem to have vent synchrony.  She is admitted to the ICU.  I did start her on treatment after her initial chest x-ray for H CAP, although I suspect this is mostly failure.  Her chest x-ray initially showed questionable infiltrates but after better aeration with intubation, it looks like failure which clinically seems to be the most predominant feature.  Her BNP has doubled in the last few days.  Lactic acid can be an result of multiple different pathologies.  Given  her significant CHF and low EF I did not give her large fluid boluses in fact it is my goal to diurese her.  Time will tell if this is the right procedure for her.  Did discuss with the hospitalist they agree with management and will admit    ____________________________________________   FINAL CLINICAL IMPRESSION(S) / ED DIAGNOSES  Final diagnoses:  Acute respiratory failure with hypoxia and hypercapnia (HCC)      This chart was dictated using voice recognition software.  Despite best efforts to proofread,  errors can occur which can change meaning.      Jeanmarie PlantMcShane, Emine Lopata A, MD 05/10/2018 [phone]

## 2018-05-16 NOTE — Consult Note (Signed)
Reason for Consult:Assistance with ventilator management Referring Physician: Henreitta Leber, MD  Alexis Ray is an 63 y.o. female.  HPI: This is a  63 y.o. 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  She was found to have oxygen saturations of 60% when EMS arrived.  She was placed on BiPAP and brought to the ED however continues to remove her BiPAP mask and had to be intubated in the ED.  History is obtained from available records as the patient cannot provide it. The patient's daughter is present however she cannot elaborate further on the history except to corroborate that her mother has been smoking, she does not believe her mother has had a fever or increasing cough.   The patient apparently was admitted to [facility] 2 weeks ago and presented in similar fashion however did not respond to BiPAP.  She was noted to have CHF exacerbation.  On evaluation today she is noted to have BNP of over 3000, procalcitonin is only 0.27.  Lactic acid was elevated at 4 but has already trended down.  She is currently intubated and mechanically ventilated and sedated.  Chest x-ray is consistent with cardiomegaly and CHF with pulmonary edema.  Echocardiogram performed on 29 December confirmed very low EF of 20% aggravated by diastolic dysfunction as well.  Past Medical History:  Diagnosis Date  . CAD (coronary artery disease)   . Chronic combined systolic and diastolic CHF (congestive heart failure) (HCC)   . COPD (chronic obstructive pulmonary disease) (HCC)   . Diabetes mellitus without complication (HCC)   . GERD (gastroesophageal reflux disease)   . Hypertension   . PVD (peripheral vascular disease) (HCC)   . Stroke (cerebrum) [facility])     Past Surgical History:  Procedure Laterality Date  . ABDOMINAL HYSTERECTOMY    .  CARDIAC CATHETERIZATION    . COLONOSCOPY    . EYE SURGERY Bilateral   . LEFT HEART CATH AND CORONARY ANGIOGRAPHY N/A 04/08/2018   Procedure: LEFT HEART CATH AND CORONARY ANGIOGRAPHY;  Surgeon: Laurier Nancy, MD;  Location: [facility] INVASIVE CV LAB;  Service: Cardiovascular;  Laterality: N/A;    Family History  Problem Relation Age of Onset  . Diabetes Mother   . Cancer Father   . Diabetes Maternal Aunt   . Diabetes Maternal Aunt     Social History:  reports that she has been smoking cigarettes. She has been smoking about 1.00 pack per day. She has never used smokeless tobacco. She reports that she does not drink alcohol or use drugs.  Allergies:  Allergies  Allergen Reactions  . Colchicine     itching  . Sulfa Antibiotics Rash    itching  . Levaquin [Levofloxacin In D5w] Rash    Medications: I have reviewed the patient's current medications.  Results for orders placed or performed during the hospital encounter of 05/31/2018 (from the past 48 hour(s))  Comprehensive metabolic panel     Status: Abnormal   Collection Time: 05/07/2018 11:11 AM  Result Value Ref Range   Sodium 137 135 - 145 mmol/L   Potassium 5.1 3.5 - 5.1 mmol/L   Chloride 102 98 - 111 mmol/L   CO2 23 22 - 32 mmol/L   Glucose, Bld 288 (H) 70 - 99 mg/dL   BUN 27 (H) 8 - 23 mg/dL  Creatinine, Ser 1.78 (H) 0.44 - 1.00 mg/dL   Calcium 8.5 (L) 8.9 - 10.3 mg/dL   Total Protein 7.5 6.5 - 8.1 g/dL   Albumin 3.8 3.5 - 5.0 g/dL   AST 38 15 - 41 U/L   ALT 42 0 - 44 U/L   Alkaline Phosphatase 83 38 - 126 U/L   Total Bilirubin 0.8 0.3 - 1.2 mg/dL   GFR calc non Af Amer 30 (L) >60 mL/min   GFR calc Af Amer 35 (L) >60 mL/min   Anion gap 12 5 - 15    Comment: Performed at [facility], [address] [place], [place] [address]  CBC with Differential     Status: Abnormal   Collection Time: 05/15/2018 11:11 AM  Result Value Ref Range   WBC 9.7 4.0 - 10.5 K/uL   RBC 4.49 3.87 - 5.11 MIL/uL   Hemoglobin 11.8 (L)  12.0 - 15.0 g/dL   HCT 65.4 65.0 - 35.4 %   MCV 89.3 80.0 - 100.0 fL   MCH 26.3 26.0 - 34.0 pg   MCHC 29.4 (L) 30.0 - 36.0 g/dL   RDW 65.6 (H) 81.2 - 75.1 %   Platelets 162 150 - 400 K/uL   nRBC 0.0 0.0 - 0.2 %   Neutrophils Relative % 81 %   Neutro Abs 7.9 (H) 1.7 - 7.7 K/uL   Lymphocytes Relative 13 %   Lymphs Abs 1.2 0.7 - 4.0 K/uL   Monocytes Relative 6 %   Monocytes Absolute 0.6 0.1 - 1.0 K/uL   Eosinophils Relative 0 %   Eosinophils Absolute 0.0 0.0 - 0.5 K/uL   Basophils Relative 0 %   Basophils Absolute 0.0 0.0 - 0.1 K/uL   Immature Granulocytes 0 %   Abs Immature Granulocytes 0.04 0.00 - 0.07 K/uL    Comment: Performed at [facility], [address] [place], [place] [address]  Troponin I - Once     Status: Abnormal   Collection Time: 05/30/2018 11:11 AM  Result Value Ref Range   Troponin I 0.14 (HH) <0.03 ng/mL    Comment: CRITICAL RESULT CALLED TO, READ BACK BY AND VERIFIED WITH KIM MAIN AT 1225 06/02/2018.PMF Performed at [facility], [address] [place], [place] [address]   Brain natriuretic peptide     Status: Abnormal   Collection Time: 05/22/2018 11:11 AM  Result Value Ref Range   B Natriuretic Peptide 3,707.0 (H) 0.0 - 100.0 pg/mL    Comment: Performed at [facility], [address] [place], [place] [address]  Influenza panel by PCR (type A & B)     Status: None   Collection Time: 05/07/2018 11:11 AM  Result Value Ref Range   Influenza A By PCR NEGATIVE NEGATIVE   Influenza B By PCR NEGATIVE NEGATIVE    Comment: (NOTE) The Xpert Xpress Flu assay is intended as an aid in the diagnosis of  influenza and should not be used as a sole basis for treatment.  This  assay is FDA approved for nasopharyngeal swab specimens only. Nasal  washings and aspirates are unacceptable for Xpert Xpress Flu testing. Performed at [facility], [address] [place], [place] [address]   Blood gas, venous     Status: Abnormal    Collection Time: 06/02/2018 11:20 AM  Result Value Ref Range   FIO2 0.60    pH, Ven 7.20 (L) 7.250 - 7.430   pCO2, Ven 63 (H) 44.0 - 60.0 mmHg  pO2, Ven <31.0 (LL) 32.0 - 45.0 mmHg    Comment: CRITICAL RESULT CALLED TO, READ BACK BY AND VERIFIED WITH:  MCSHANE AT 1132, 05/05/2018, LS    Bicarbonate 24.6 20.0 - 28.0 mmol/L   Acid-base deficit 4.6 (H) 0.0 - 2.0 mmol/L   O2 Saturation 28.7 %   Patient temperature 37.0    Collection site VENOUS    Sample type VENOUS     Comment: Performed at [facility], [address] [place], [place] [id number]  CG4 I-STAT (Lactic acid)     Status: Abnormal   Collection Time: 05/26/2018 11:27 AM  Result Value Ref Range   Lactic Acid, Venous 4.52 (HH) 0.5 - 1.9 mmol/L   Comment NOTIFIED PHYSICIAN   Glucose, capillary     Status: Abnormal   Collection Time: 05/29/2018 11:28 AM  Result Value Ref Range   Glucose-Capillary 260 (H) 70 - 99 mg/dL  Glucose, capillary     Status: Abnormal   Collection Time: 05/09/2018  2:01 PM  Result Value Ref Range   Glucose-Capillary 218 (H) 70 - 99 mg/dL    Dg Chest Portable 1 View  Result Date: 05/22/2018 CLINICAL DATA:  Endotracheal tube placement. EXAM: PORTABLE CHEST 1 VIEW COMPARISON:  Radiograph of same day. FINDINGS: Stable cardiomegaly. Atherosclerosis of thoracic aorta is noted. Endotracheal tube is in grossly good position. Nasogastric tube is seen entering the stomach with tip in proximal stomach. No pneumothorax is noted. Bilateral diffuse interstitial densities are noted concerning for pulmonary edema. Small left pleural effusion may be present. Bony thorax is unremarkable. IMPRESSION: Stable cardiomegaly. Endotracheal and nasogastric tubes are in grossly good position. Stable bilateral pulmonary edema is noted with possible left pleural effusion. Aortic Atherosclerosis (ICD10-I70.0). Electronically Signed   By: Lupita RaiderJames  Green Jr, M.D.   On: 05/08/2018 12:38   Dg Chest Portable 1 View  Result Date:  05/14/2018 CLINICAL DATA:  Shortness of breath. EXAM: PORTABLE CHEST 1 VIEW COMPARISON:  Chest radiograph 05/01/2018 FINDINGS: Monitoring leads overlie the patient. Stable cardiomegaly. Diffuse bilateral interstitial pulmonary opacities. Interval increase in bilateral mid and lower lung heterogeneous opacities. Probable tiny pleural effusions. IMPRESSION: Cardiomegaly with mild interstitial edema. Increase in basilar opacities may represent superimposed atelectasis, infection or focal edema. Electronically Signed   By: Annia Beltrew  Davis M.D.   On: 05/18/2018 11:59    Review of Systems  Unable to perform ROS: Intubated   Blood pressure 100/75, pulse 100, temperature 99.2 F (37.3 C), temperature source Axillary, resp. rate (!) 32, height 5\' 8"  (1.727 m), weight 89 kg, SpO2 95 %. Physical Exam  Nursing note and vitals reviewed. Constitutional: She appears well-developed. She is intubated.  Overweight female, sedated, intubated, mechanically ventilated.  HENT:  Head: Normocephalic and atraumatic.  Mouth/Throat: Oropharynx is clear and moist.  ET tube orotracheally placed.  OG in place.  Eyes: Pupils are equal, round, and reactive to light. Conjunctivae are normal. No scleral icterus.  Neck: Neck supple. No JVD present. No tracheal deviation present. No thyromegaly present.  Cardiovascular: Regular rhythm. Tachycardia present. Exam reveals gallop and S3.    No systolic murmur is present.  No diastolic murmur is present. Respiratory: She is intubated. She has no wheezes. She has rales in the right lower field and the left lower field.  Synchronous with the ventilator rhonchi throughout.  GI: Soft. Bowel sounds are normal. She exhibits no distension.  Protuberant abdomen  Musculoskeletal:        General: No deformity or edema.  Lymphadenopathy:  She has no cervical adenopathy.  Neurological:  Sedated, cannot make further assessment though it is noted that she moves all 4's spontaneously.  Skin:  Skin is warm and dry. She is not diaphoretic.  Psychiatric:  Patient intubated.  Cannot assess.    Assessment/Plan:  1.  Acute on chronic hypoxic respiratory failure due to decompensation of combined systolic and diastolic chronic heart failure: Continue ventilatory support.  Settings discussed with RT. Continuous sedation protocol with Precedex and fentanyl. Diurese as tolerates. Do not suspect infectious process at present. Does not appear to have COPD exacerbation per se. Wake up assessment daily.  2.  COPD with chronic respiratory failure: Does not appear to have acute exacerbation at present. Continue nebulization treatments, DuoNeb 4 times a day. Recommend quick withdrawal of steroids, she does not have bronchospasm.  3.  Acute on chronic CHF exacerbation of combined systolic and diastolic heart failure left ventricular ejection fraction 20%: Diurese as tolerates.  Monitor BMP.  Continue her cardiac medications.  May need inotrope support if cannot wean off the ventilator.  4.  CKD stage III: Stable.  Avoid nephrotoxins, monitor urine output.  Electrolyte repletion as needed.  5.  Diabetes mellitus: Sliding scale insulin discontinue steroids.  6.  GERD/GI prophylaxis: Protonix  7.  DVT prophylaxis: Lovenox.  8.  Tobacco dependence due to cigarettes: Tobacco cessation counseling once patient can participate.  Discussed with patient's daughter at bedside.  Patient's daughter would like to discuss potential palliative management as she is aware of the seriousness of her mother's condition.  The patient's overall prognosis is guarded given the severity of cardiopulmonary disease.  Critical care time 60 minutes   C. Danice GoltzLaura Ezzie Senat, MD [facility]  12-Apr-2019, 2:29 PM

## 2018-05-16 NOTE — ED Notes (Signed)
CBG 260

## 2018-05-16 NOTE — Progress Notes (Signed)
Peggy from IT followed up and stated that the confidentiality had been removed from the pt's chart. At this time it is unclear who has removed same. Elnita Maxwell, [facility], stated that since the pt is no longer confidential, we are free to share information with the pt's family and POA's.

## 2018-05-17 ENCOUNTER — Inpatient Hospital Stay: Payer: Medicare HMO

## 2018-05-17 ENCOUNTER — Encounter (INDEPENDENT_AMBULATORY_CARE_PROVIDER_SITE_OTHER): Payer: Self-pay | Admitting: Vascular Surgery

## 2018-05-17 DIAGNOSIS — J9601 Acute respiratory failure with hypoxia: Secondary | ICD-10-CM

## 2018-05-17 LAB — BASIC METABOLIC PANEL
Anion gap: 7 (ref 5–15)
BUN: 33 mg/dL — ABNORMAL HIGH (ref 8–23)
CHLORIDE: 108 mmol/L (ref 98–111)
CO2: 26 mmol/L (ref 22–32)
Calcium: 8 mg/dL — ABNORMAL LOW (ref 8.9–10.3)
Creatinine, Ser: 1.51 mg/dL — ABNORMAL HIGH (ref 0.44–1.00)
GFR calc non Af Amer: 37 mL/min — ABNORMAL LOW (ref >60)
GFR, EST AFRICAN AMERICAN: 43 mL/min — AB (ref >60)
Glucose, Bld: 172 mg/dL — ABNORMAL HIGH (ref 70–99)
Potassium: 4.4 mmol/L (ref 3.5–5.1)
Sodium: 141 mmol/L (ref 135–145)

## 2018-05-17 LAB — GLUCOSE, CAPILLARY
GLUCOSE-CAPILLARY: 153 mg/dL — AB (ref 70–99)
GLUCOSE-CAPILLARY: 120 mg/dL — AB (ref 70–99)
Glucose-Capillary: 125 mg/dL — ABNORMAL HIGH (ref 70–99)
Glucose-Capillary: 213 mg/dL — ABNORMAL HIGH (ref 70–99)
Glucose-Capillary: 203 mg/dL — ABNORMAL HIGH (ref 70–99)

## 2018-05-17 LAB — TRIGLYCERIDES: Triglycerides: 95 mg/dL (ref <150)

## 2018-05-17 LAB — CBC
HCT: 35.5 % — ABNORMAL LOW (ref 36.0–46.0)
Hemoglobin: 10.7 g/dL — ABNORMAL LOW (ref 12.0–15.0)
MCH: 26.6 pg (ref 26.0–34.0)
MCHC: 30.1 g/dL (ref 30.0–36.0)
MCV: 88.1 fL (ref 80.0–100.0)
Platelets: 107 10*3/uL — ABNORMAL LOW (ref 150–400)
RBC: 4.03 MIL/uL (ref 3.87–5.11)
RDW: 17.3 % — ABNORMAL HIGH (ref 11.5–15.5)
WBC: 5.6 10*3/uL (ref 4.0–10.5)
nRBC: 0 % (ref 0.0–0.2)

## 2018-05-17 LAB — TROPONIN I: TROPONIN I: 0.17 ng/mL — AB (ref <0.03)

## 2018-05-17 LAB — BRAIN NATRIURETIC PEPTIDE: B Natriuretic Peptide: 3201 pg/mL — ABNORMAL HIGH (ref 0.0–100.0)

## 2018-05-17 MED ORDER — INSULIN GLARGINE 100 UNIT/ML ~~LOC~~ SOLN
25.0000 [IU] | Freq: Every day | SUBCUTANEOUS | Status: DC
  Administered 2018-05-17 – 2018-05-19 (×3): 25 [IU] via SUBCUTANEOUS
  Filled 2018-05-17 (×4): qty 0.25

## 2018-05-17 MED ORDER — MIDAZOLAM HCL 2 MG/2ML IJ SOLN
2.0000 mg | INTRAMUSCULAR | Status: DC | PRN
  Administered 2018-05-19: 2 mg via INTRAVENOUS
  Filled 2018-05-17: qty 2

## 2018-05-17 MED ORDER — VITAL HIGH PROTEIN PO LIQD
1000.0000 mL | ORAL | Status: DC
  Administered 2018-05-17 – 2018-05-18 (×2): 1000 mL

## 2018-05-17 MED ORDER — ASPIRIN 81 MG PO CHEW
81.0000 mg | CHEWABLE_TABLET | Freq: Every day | ORAL | Status: DC
  Administered 2018-05-17 – 2018-05-18 (×2): 81 mg
  Filled 2018-05-17 (×2): qty 1

## 2018-05-17 MED ORDER — VECURONIUM BROMIDE 10 MG IV SOLR
INTRAVENOUS | Status: AC
  Filled 2018-05-17: qty 10

## 2018-05-17 MED ORDER — VENLAFAXINE HCL 37.5 MG PO TABS
75.0000 mg | ORAL_TABLET | Freq: Three times a day (TID) | ORAL | Status: DC
  Administered 2018-05-17 – 2018-05-18 (×6): 75 mg
  Filled 2018-05-17 (×12): qty 2

## 2018-05-17 MED ORDER — INSULIN ASPART 100 UNIT/ML ~~LOC~~ SOLN
0.0000 [IU] | SUBCUTANEOUS | Status: DC
  Administered 2018-05-17: 2 [IU] via SUBCUTANEOUS
  Administered 2018-05-17: 5 [IU] via SUBCUTANEOUS
  Administered 2018-05-18 (×2): 3 [IU] via SUBCUTANEOUS
  Administered 2018-05-18: 5 [IU] via SUBCUTANEOUS
  Administered 2018-05-18: 11 [IU] via SUBCUTANEOUS
  Administered 2018-05-18: 8 [IU] via SUBCUTANEOUS
  Administered 2018-05-19 (×5): 3 [IU] via SUBCUTANEOUS
  Administered 2018-05-20 (×2): 2 [IU] via SUBCUTANEOUS
  Filled 2018-05-17 (×14): qty 1

## 2018-05-17 MED ORDER — MIDAZOLAM HCL 2 MG/2ML IJ SOLN
4.0000 mg | Freq: Once | INTRAMUSCULAR | Status: AC
  Administered 2018-05-17: 4 mg via INTRAVENOUS

## 2018-05-17 MED ORDER — PROPOFOL 1000 MG/100ML IV EMUL
5.0000 ug/kg/min | INTRAVENOUS | Status: DC
  Administered 2018-05-17: 20 ug/kg/min via INTRAVENOUS
  Administered 2018-05-18 (×2): 30 ug/kg/min via INTRAVENOUS
  Administered 2018-05-18: 40 ug/kg/min via INTRAVENOUS
  Administered 2018-05-19 (×2): 30 ug/kg/min via INTRAVENOUS
  Filled 2018-05-17 (×7): qty 100

## 2018-05-17 MED ORDER — NOREPINEPHRINE-SODIUM CHLORIDE 4-0.9 MG/250ML-% IV SOLN: 0.0000 ug/min | INTRAVENOUS | Status: DC

## 2018-05-17 MED ORDER — METHYLPREDNISOLONE SODIUM SUCC 40 MG IJ SOLR
40.0000 mg | Freq: Two times a day (BID) | INTRAMUSCULAR | Status: DC
  Administered 2018-05-17 – 2018-05-20 (×7): 40 mg via INTRAVENOUS
  Filled 2018-05-17 (×7): qty 1

## 2018-05-17 MED ORDER — NOREPINEPHRINE-SODIUM CHLORIDE 4-0.9 MG/250ML-% IV SOLN
INTRAVENOUS | Status: AC
  Filled 2018-05-17: qty 250

## 2018-05-17 MED ORDER — IPRATROPIUM-ALBUTEROL 0.5-2.5 (3) MG/3ML IN SOLN
3.0000 mL | RESPIRATORY_TRACT | Status: DC
  Administered 2018-05-17 – 2018-05-20 (×18): 3 mL via RESPIRATORY_TRACT
  Filled 2018-05-17 (×17): qty 3

## 2018-05-17 MED ORDER — NOREPINEPHRINE-SODIUM CHLORIDE 4-0.9 MG/250ML-% IV SOLN
0.0000 ug/min | INTRAVENOUS | Status: DC
  Administered 2018-05-17 – 2018-05-18 (×2): 6 ug/min via INTRAVENOUS
  Filled 2018-05-17: qty 250

## 2018-05-17 MED ORDER — MIDAZOLAM HCL 2 MG/2ML IJ SOLN: 2.0000 mg | INTRAMUSCULAR | Status: DC | PRN

## 2018-05-17 MED ORDER — ADULT MULTIVITAMIN LIQUID CH
15.0000 mL | Freq: Every day | ORAL | Status: DC
  Administered 2018-05-17 – 2018-05-18 (×2): 15 mL
  Filled 2018-05-17 (×4): qty 15

## 2018-05-17 MED ORDER — SENNOSIDES-DOCUSATE SODIUM 8.6-50 MG PO TABS
1.0000 | ORAL_TABLET | Freq: Two times a day (BID) | ORAL | Status: DC
  Administered 2018-05-17 – 2018-05-18 (×3): 1
  Filled 2018-05-17 (×3): qty 1

## 2018-05-17 MED ORDER — VECURONIUM BROMIDE 10 MG IV SOLR
10.0000 mg | Freq: Once | INTRAVENOUS | Status: AC
  Administered 2018-05-17: 10 mg via INTRAVENOUS

## 2018-05-17 MED ORDER — ENOXAPARIN SODIUM 40 MG/0.4ML ~~LOC~~ SOLN
40.0000 mg | SUBCUTANEOUS | Status: DC
  Administered 2018-05-17: 40 mg via SUBCUTANEOUS
  Filled 2018-05-17: qty 0.4

## 2018-05-17 MED ORDER — PRO-STAT SUGAR FREE PO LIQD
30.0000 mL | Freq: Two times a day (BID) | ORAL | Status: DC
  Administered 2018-05-17 – 2018-05-18 (×4): 30 mL

## 2018-05-17 MED ORDER — MIDAZOLAM HCL 2 MG/2ML IJ SOLN
INTRAMUSCULAR | Status: AC
  Filled 2018-05-17: qty 4

## 2018-05-17 MED ORDER — ROSUVASTATIN CALCIUM 10 MG PO TABS
40.0000 mg | ORAL_TABLET | Freq: Every day | ORAL | Status: DC
  Administered 2018-05-17 – 2018-05-18 (×2): 40 mg
  Filled 2018-05-17 (×2): qty 4

## 2018-05-17 NOTE — Progress Notes (Signed)
CRITICAL CARE NOTE  CC  follow up respiratory failure  SUBJECTIVE Patient remains critically ill Prognosis is guarded Thick secretions from ETT On Vent     SIGNIFICANT EVENTS COPD on 4 L [place] at home EF 15%   Failed biPAP 1/12 -intubated on MV support    BP (!) 78/58   Pulse 80   Temp (!) 100.9 F (38.3 C) (Axillary)   Resp (!) 22   Ht 5\' 8"  (1.727 m)   Wt 89 kg   SpO2 94%   BMI 29.83 kg/m    REVIEW OF SYSTEMS  PATIENT IS UNABLE TO PROVIDE COMPLETE REVIEW OF SYSTEMS DUE TO SEVERE CRITICAL ILLNESS   PHYSICAL EXAMINATION:  GENERAL:critically ill appearing, +resp distress HEAD: Normocephalic, atraumatic.  EYES: Pupils equal, round, reactive to light.  No scleral icterus.  MOUTH: Moist mucosal membrane. NECK: Supple. No thyromegaly. No nodules. No JVD.  PULMONARY: +rhonchi, +wheezing CARDIOVASCULAR: S1 and S2. Regular rate and rhythm. No murmurs, rubs, or gallops.  GASTROINTESTINAL: Soft, nontender, -distended. No masses. Positive bowel sounds. No hepatosplenomegaly.  MUSCULOSKELETAL: No swelling, clubbing, or edema.  NEUROLOGIC: obtunded, GCS<8 SKIN:intact,warm,dry      Indwelling Urinary Catheter continued, requirement due to   Reason to continue Indwelling Urinary Catheter for strict Intake/Output monitoring for hemodynamic instability         Ventilator continued, requirement due to, resp failure    Ventilator Sedation RASS 0 to -2     ASSESSMENT AND PLAN SYNOPSIS   Severe Hypoxic and Hypercapnic Respiratory Failure from sCHF exacerbation and pneumonia with COPD exacerbation -continue Full MV support -continue Bronchodilator Therapy -Wean Fio2 and PEEP as tolerated   SEVERE COPD EXACERBATION -continue IV steroids as prescribed -continue NEB THERAPY as prescribed -wean fio2 as needed and tolerated Thick secretions-may need BRONCH   CARDIAC FAILURE-systolic -oxygen as needed -Lasix as tolerated   Renal Failure-most likely due to  ATN -follow chem 7 -follow UO -continue Foley Catheter-assess need daily   NEUROLOGY - intubated and sedated - minimal sedation to achieve a RASS goal: -1 Wake up assessment pending    ID -follow up cultures  GI/Nutrition GI PROPHYLAXIS as indicated DIET-->TF's as tolerated Constipation protocol as indicated  ENDO - ICU hypoglycemic\Hyperglycemia protocol -check FSBS per protocol   ELECTROLYTES -follow labs as needed -replace as needed -pharmacy consultation and following   DVT/GI PRX ordered TRANSFUSIONS AS NEEDED MONITOR FSBS ASSESS the need for LABS as needed   Critical Care Time devoted to patient care services described in this note is 35 minutes.   Overall, patient is critically ill, prognosis is guarded.  Patient with Multiorgan failure and at high risk for cardiac arrest and death.    Lucie Leather, M.D.  [facility]  Medical Director [facility] Medical Director [facility] Cardio-Pulmonary Department

## 2018-05-17 NOTE — Progress Notes (Signed)
Initial Nutrition Assessment  DOCUMENTATION CODES:   Obesity unspecified  INTERVENTION:  Initiate Vital High Protein at 40 mL/hr (960 mL goal daily volume) + Pro-Stat 30 mL BID per tube. Provides 1160 kcal, 114 grams of protein, 806 mL H2O daily.  Provide liquid MVI daily per tube as goal TF regimen does not meet 100% RDIs for vitamins/minerals.  Provide minimum free water flush of 30 mL Q4hrs to maintain tube patency.  NUTRITION DIAGNOSIS:   Inadequate oral intake related to inability to eat as evidenced by NPO status.  GOAL:   Provide needs based on ASPEN/SCCM guidelines  MONITOR:   Vent status, Labs, Weight trends, TF tolerance, I & O's  REASON FOR ASSESSMENT:   Ventilator    ASSESSMENT:   63 year old female with PMHx of DM, HTN, chronic combined systolic and diastolic CHF (LV EF 15% on 12/4), hx CVA, COPD, GERD, PVD, PAD admitted with severe hypoxic and hypercapnic respiratory failure from CHF exacerbation, PNA, and severe COPD exacerbation requiring intubation on 1/12.   Patient intubated and sedated. On PRVC mode with FiO2 40% and PEEP 5 cmH2O. Abdomen soft. Last BM was 1/12 per chart. Weight appears stable in chart. Patient is currently 89 kg (196.21 lbs). Unsure if current height is accurate. Heights in chart have previously been documented 5\' 3" -5\' 6" , which most of them being documented as 5\' 5" . Will calculate as if patient is 5\' 5"  as this is most likely the accurate height and current height was estimated on admission. Patient's BMI with height of 5\' 5"  is 32.65 kg/m2. Patient known to this RD from a recent admission.  Enteral Access: 16 Fr. OGT placed 1/12; terminates in proximal stomach per chest x-ray 1/12; 65 cm at corner of mouth  MAP: 61-71 mmHg  Patient is currently intubated on ventilator support Ve: 12.2 L/min Temp (24hrs), Avg:99.5 F (37.5 C), Min:98.5 F (36.9 C), Max:100.9 F (38.3 C)  Propofol: N/A  Medications reviewed and include: Lasix 40  mg Q12hrs IV, Novolog 0-15 units Q4hrs, Lantus 45 units QHS,  40 mg Q12hrs IV, Protonix 40 mg daily per tube, fentanyl gtt.  Labs reviewed: CBG 153-215, BUN 33, Creatinine 1.51, BNP 3201.  I/O: 1925 mL UOP yesterday   Patient does not meet criteria for malnutrition.  Discussed with RN and on rounds. Plan is to start tube feeds today after bronchoscopy.  NUTRITION - FOCUSED PHYSICAL EXAM:    Most Recent Value  Orbital Region  No depletion  Upper Arm Region  No depletion  Thoracic and Lumbar Region  No depletion  Buccal Region  Unable to assess  Temple Region  No depletion  Clavicle Bone Region  No depletion  Clavicle and Acromion Bone Region  No depletion  Scapular Bone Region  Unable to assess  Dorsal Hand  No depletion  Patellar Region  No depletion  Anterior Thigh Region  No depletion  Posterior Calf Region  No depletion  Edema (RD Assessment)  None  Hair  Reviewed  Eyes  Unable to assess  Mouth  Unable to assess  Skin  Reviewed  Nails  Reviewed     Diet Order:   Diet Order            Diet NPO time specified  Diet effective now             EDUCATION NEEDS:   No education needs have been identified at this time  Skin:  Skin Assessment: Reviewed RN Assessment  Last BM:  2018-10-21  Height:   Ht Readings from Last 1 Encounters:  05/24/2018 5\' 8"  (1.727 m)   Weight:   Wt Readings from Last 1 Encounters:  May 24, 2018 89 kg   Ideal Body Weight:  56.8 kg(calculated using 5\' 5" )  BMI:  Body mass index is 29.83 kg/m.  Estimated Nutritional Needs:   Kcal:  [phone] (11-14 kcal/kg)  Protein:  114 grams (2 grams/kg IBW)  Fluid:  1.4 L/day (25 mL/kg IBW)  Helane Rima, MS, RD, LDN Office: [phone] Pager: [phone] After Hours/Weekend Pager: [phone]

## 2018-05-17 NOTE — Progress Notes (Signed)
[facility] [place] [facility]   PATIENT NAME: Alexis Ray    MR#:  [id number]  DATE OF BIRTH:  July 02, 1955  SUBJECTIVE:  CHIEF COMPLAINT:   Chief Complaint  Patient presents with  . Shortness of Breath   No new complaint this morning.  Patient remains intubated and sedated.  Critical care physician planning on bronchoscopy this afternoon  REVIEW OF SYSTEMS:  ROS  Unobtainable due to patient being sedated on vent  DRUG ALLERGIES:   Allergies  Allergen Reactions  . Colchicine     itching  . Sulfa Antibiotics Rash    itching  . Levaquin [Levofloxacin In D5w] Rash   VITALS:  Blood pressure 109/72, pulse (!) 110, temperature 99.2 F (37.3 C), temperature source Axillary, resp. rate 20, height 5\' 8"  (1.727 m), weight 89 kg, SpO2 90 %. PHYSICAL EXAMINATION:  GENERAL:  [age] y.o.-year-old patient lying in the bed, intubated, critically ill appearing EYES: Pupils equal, round, reactive to light and accommodation. No scleral icterus. Extraocular muscles intact.  HEENT: Head atraumatic, normocephalic. Oropharynx and nasopharynx clear.  NECK:  Supple, no jugular venous distention. No thyroid enlargement, no tenderness.  LUNGS: Scant breath sounds bilaterally, bibasilar scattered wheezes heard.  No rails or rhonchi.  Using accessory muscles to breathe in spite of being on the ventilator CARDIOVASCULAR: S1, S2 normal. No murmurs, rubs, or gallops.  ABDOMEN: Soft, obese, nontender, nondistended. Bowel sounds present. No organomegaly or mass.  EXTREMITIES: No pedal edema, cyanosis, or clubbing.  NEUROLOGIC: is sedated.,  Unable to do a complete neuro exam PSYCHIATRIC: The patient is sedated.  SKIN: No obvious rash,   LABORATORY PANEL:  Female CBC Recent Labs  Lab 05/17/18 0424  WBC 5.6  HGB 10.7*  HCT 35.5*  PLT 107*   ------------------------------------------------------------------------------------------------------------------ Chemistries  Recent  Labs  Lab 09/11/2018 1111 05/17/18 0424  NA 137 141  K 5.1 4.4  CL 102 108  CO2 23 26  GLUCOSE 288* 172*  BUN 27* 33*  CREATININE 1.78* 1.51*  CALCIUM 8.5* 8.0*  AST 38  --   ALT 42  --   ALKPHOS 83  --   BILITOT 0.8  --    RADIOLOGY:  Dg Chest Port 1 View  Result Date: 05/17/2018 CLINICAL DATA:  Acute respiratory failure. EXAM: PORTABLE CHEST 1 VIEW COMPARISON:  05/09/202020 and 05/01/2018 FINDINGS: Endotracheal tube tip is 2.5 cm above the carina. NG tube tip is below the diaphragm. Chronic cardiomegaly. Pulmonary vascularity is now normal. Interstitial accentuation has improved. Slight residual atelectasis and scarring at the left lung base. IMPRESSION: Overall improved pulmonary vascularity and interstitial accentuation. Electronically Signed   By: Francene BoyersJames  Maxwell M.D.   On: 05/17/2018 08:14   ASSESSMENT AND PLAN:    Alexis GaribaldiDebra Ray  is a 63 y.o. female with a known history of chronic respiratory failure secondary to COPD with ongoing smoking on 4 L home oxygen, chronic systolic heart failure with last known ejection fraction less than 15%, diabetes mellitus, hypertension and GERD presents to hospital secondary to worsening shortness of breath.  1.  Acute hypoxic respiratory failure-secondary to COPD exacerbation and CHF exacerbation -Intubated, currently on ventilator -Admit to ICU, intensivist consult for vent management -Started on IV steroids, inhalers/nebulizers. -Evaluated by critical care physician with plans for bronchoscopy today.  2.  Acute on chronic CHF exacerbation-systolic dysfunction with EF less than 15% Patient seen by cardiologist.  Patient said to have been compliant with her medications and was wearing LifeVest on follow-up.  Plan is for once patient gets extubated and blood pressure stable, to resume medications. Per cardiology documentation recent ejection fraction on recent 2D echocardiogram done about 5 days prior to this admission in cardiologist office was 25  to 30%.   Started on IV Lasix and monitor BNP -Continue cardiac medications including aspirin, statin, Coreg, aldactone and Entresto  3.  CKD stage III-creatinine seems to be stable.  Monitor while on IV Lasix  4.  Diabetes mellitus-continue home medications.  Monitor with sliding scale insulin especially while on steroids.  5.  GERD-Protonix    DVT prophylaxis-on Lovenox   All the records are reviewed and case discussed with Care Management/Social Worker. Management plans discussed with the patient, family and they are in agreement.  CODE STATUS: Full Code  TOTAL TIME TAKING CARE OF THIS PATIENT: 36 minutes.   More than 50% of the time was spent in counseling/coordination of care: YES  POSSIBLE D/C IN 3 DAYS, DEPENDING ON CLINICAL CONDITION.   Makaiya Geerdes M.D on 05/17/2018 at 4:35 PM  Between 7am to 6pm - Pager - [phone]  After 6pm go to [email or web address] - Social research officer, governmentpassword [email or web address] [facility]  [facility]  Office  [phone]  CC: Primary care physician; Keane PoliceNoorani, Sezmin S, MD  Note: This dictation was prepared with Dragon dictation along with smaller phrase technology. Any transcriptional errors that result from this process are unintentional.

## 2018-05-17 NOTE — Care Management Note (Signed)
Case Management Note  Patient Details  Name: Alexis Ray MRN: [id number] Date of Birth: 03-13-1956  Subjective/Objective:   Patient admitted with acute respiratory failure, COPD exacerbation.  Patient required intubation and ICU level care.  Patient is currently on the vent and sedated.  There is no family present at the bedside.  [facility].  Chronic O2.  Patient has frequent admissions this is number 4 in the past 6 months.  Palliative has been consulted.  RNCM will cont to follow. Robbie Lis RN BSN [phone]               Action/Plan:   Expected Discharge Date:                  Expected Discharge Plan:  Home w Home Health Services  In-House Referral:     Discharge planning Services     Post Acute Care Choice:    Choice offered to:     DME Arranged:    DME Agency:     HH Arranged:  RN, PT HH Agency:  [facility](open with [facility])  Status of Service:  In process, will continue to follow  If discussed at Long Length of Stay Meetings, dates discussed:    Additional Comments:  Allayne Butcher, RN 05/17/2018, 11:39 AM

## 2018-05-17 NOTE — Procedures (Signed)
PROCEDURE: BRONCHOSCOPY Therapeutic Aspiration of Tracheobronchial Tree  PROCEDURE DATE: 05/17/2018  TIME:  NAME:  Alexis Ray  DOB:1955-08-19  MRN: [id number]     Code Status Orders  (From admission, onward)         Start     Ordered   11-23-18 1546  Full code  Continuous     11-23-18 1546        Code Status History    Date Active Date Inactive Code Status Order ID Comments User Context   04/30/2018 0012 05/02/2018 1859 Full Code [id number]  Oralia ManisWillis, David, MD ED   04/06/2018 0524 04/09/2018 1904 Full Code [id number]  Arnaldo Nataliamond, Michael S, MD Inpatient   06/05/2016 2320 06/09/2016 1759 Full Code [id number]  Hugelmeyer, Alexis, DO ED   05/29/2016 0643 05/30/2016 2000 Full Code [id number]  Arnaldo Nataliamond, Michael S, MD ED   05/23/2015 2256 05/26/2015 1947 Full Code [id number]  Oralia ManisWillis, David, MD Inpatient          Indications/Preliminary Diagnosis:   Consent: (Place X beside choice/s below)  The benefits, risks and possible complications of the procedure were        explained to:  ___ patient  _x__ patient's family  ___ other:___________  who verbalized understanding and gave:  ___ verbal  ___ written  ___ verbal and written  ___ telephone  ___ other:________ consent.    x  Unable to obtain consent; procedure performed on emergent basis.     Other:       PRESEDATION ASSESSMENT: History and Physical has been performed. Patient meds and allergies have been reviewed. Presedation airway examination has been performed and documented. Baseline vital signs, sedation score, oxygenation status, and cardiac rhythm were reviewed. Patient was deemed to be in satisfactory condition to undergo the procedure.    PREMEDICATIONS:   Sedative/Narcotic Amt Dose   Versed 4 mg   Fentanyl 100 mcg  Diprivan  mg  Vecuronium 10 mg        PROCEDURE DETAILS: Timeout performed and correct patient, name, & ID confirmed. Following prep per Pulmonary policy, appropriate sedation was administered. The  Bronchoscope was inserted in to oral cavity with bite block in place. Therapeutic aspiration of Tracheobronchial tree was performed.  Airway exam proceeded with findings, technical procedures, and specimen collection as noted below. At the end of exam the scope was withdrawn without incident. Impression and Plan as noted below.           Airway Prep (Place X beside choice below)   1% Transtracheal Lidocaine Anesthetization 7 cc   Patient prepped per Bronchoscopy Lab Policy       Insertion Route (Place X beside choice below)   Nasal   Oral  x Endotracheal Tube   Tracheostomy    TECHNICAL PROCEDURES: (Place X beside choice below)   Procedures  Description    None     Electrocautery     Cryotherapy     Balloon Dilatation     Bronchography     Stent Placement   x  Therapeutic Aspiration Multiple mucus plugs extracted, extensive amounts of purulent secretions from all segments    Laser/Argon Plasma    Brachytherapy Catheter Placement    Foreign Body Removal         SPECIMENS (Sites): (Place X beside choice below)  Specimens Description   No Specimens Obtained     [place]ashings   x Lavage 15 cc's obtained for cultures   Biopsies    Fine Needle Aspirates  Brushings    Sputum    FINDINGS: extensive amounts of purulent secretions ESTIMATED BLOOD LOSS: none COMPLICATIONS/RESOLUTION: none      IMPRESSION:POST-PROCEDURE DX:  pneumonia   RECOMMENDATION/PLAN:   Continue IV abx    Lucie Leather, M.D.  [facility]  Medical Director [facility] Medical Director [facility] Cardio-Pulmonary Department

## 2018-05-17 NOTE — Progress Notes (Signed)
Bedside bronchoscopy performed with disposable scope.  Procedure started at 1433 and ended at 1438.  Specimen obtained for BAL and sent to lab.

## 2018-05-17 NOTE — Consult Note (Signed)
Alexis Ray is a 63 y.o. female  [id number]  Primary Cardiologist: Neoma Laming Reason for Consultation: Respiratory failure CHF  HPI: Is a 63 year old female with a history of 30% proximal RCA disease on cardiac catheterization on 04/08/2018 with left ventricular ejection fraction 15% and four-chamber dilatation on echocardiogram, presented to the hospital with respiratory failure and got intubated.  Patient had echocardiogram done on 05/13/2018 in my office which showed severely dilated left ventricle with diffuse hypokinesis and left ventricular ejection fraction 25 to 30% as was taking Entresto, carvedilol, spironolactone and was getting better with Em[place]ium installed.  She now presents with respiratory failure and is intubated.   Review of Systems: Unable to get any further history   Past Medical History:  Diagnosis Date  . CAD (coronary artery disease)   . Chronic combined systolic and diastolic CHF (congestive heart failure) ([place])   . COPD (chronic obstructive pulmonary disease) ([place])   . Diabetes mellitus without complication ([place])   . GERD (gastroesophageal reflux disease)   . Hypertension   . PVD (peripheral vascular disease) ([place])   . Stroke (cerebrum) (H[facility])     Medications Prior to Admission  Medication Sig Dispense Refill  . acetaminophen (TYLENOL) 500 MG tablet Take 1 tablet by mouth every 6 (six) hours as needed for mild pain or fever.     Marland Kitchen albuterol (PROVENTIL) (2.5 MG/3ML) 0.083% nebulizer solution Take 3 mLs (2.5 mg total) by nebulization every 6 (six) hours as needed for wheezing or shortness of breath. 75 mL 2  . albuterol (VENTOLIN HFA) 108 (90 Base) MCG/ACT inhaler Inhale 2 puffs into the lungs every 6 (six) hours as needed for wheezing or shortness of breath.     Marland Kitchen aspirin EC 81 MG tablet Take 81 mg by mouth daily.    . budesonide-formoterol (SYMBICORT) 160-4.5 MCG/ACT inhaler Inhale 2 puffs into the lungs 2 (two) times daily.    . carvedilol (COREG) 6.25 MG  tablet Take 6.25 mg by mouth 2 (two) times daily with a meal.    . cholecalciferol (VITAMIN D3) 25 MCG (1000 UT) tablet Take 1,000 Units by mouth daily.    . cilostazol (PLETAL) 100 MG tablet Take 100 mg by mouth 2 (two) times daily.    . Dulaglutide 1.5 MG/0.5ML SOPN Inject 1.5 mg into the skin every Monday.    . esomeprazole (NEXIUM) 40 MG capsule Take 40 mg by mouth daily.     . ferrous sulfate 325 (65 FE) MG tablet Take 325 mg by mouth daily with breakfast.    . fluticasone (FLONASE) 50 MCG/ACT nasal spray Place 1 spray into both nostrils 2 (two) times daily.    . furosemide (LASIX) 40 MG tablet Take 1 tablet (40 mg total) by mouth daily. 30 tablet 0  . guaiFENesin (MUCINEX) 600 MG 12 hr tablet Take 1 tablet (600 mg total) by mouth 2 (two) times daily. 20 tablet 0  . insulin aspart (NOVOLOG) 100 UNIT/ML injection Inject 10 Units into the skin 3 (three) times daily with meals.    . insulin glargine (LANTUS) 100 UNIT/ML injection Inject 45 Units into the skin at bedtime.     Marland Kitchen levocetirizine (XYZAL) 5 MG tablet Take 5 mg by mouth at bedtime.     . metFORMIN (GLUCOPHAGE) 500 MG tablet Take 500 mg by mouth 2 (two) times daily with a meal.     . Multiple Vitamins-Minerals (MULTIVITAMIN ADULT) TABS Take 1 tablet by mouth daily.    . nitroGLYCERIN (NITROSTAT)  0.4 MG SL tablet Place 0.4 mg under the tongue every 5 (five) minutes as needed for chest pain.    . pramipexole (MIRAPEX) 0.125 MG tablet Take 0.125 mg by mouth at bedtime.     . pregabalin (LYRICA) 150 MG capsule Take 150 mg by mouth 3 (three) times daily.     . rosuvastatin (CRESTOR) 40 MG tablet Take 40 mg by mouth daily.    . sacubitril-valsartan (ENTRESTO) 49-51 MG Take 1 tablet by mouth 2 (two) times daily.    Marland Kitchen spironolactone (ALDACTONE) 25 MG tablet Take 25 mg by mouth daily.     . Tiotropium Bromide Monohydrate (SPIRIVA RESPIMAT) 2.5 MCG/ACT AERS Inhale 2 puffs into the lungs daily.    . Venlafaxine HCl 225 MG TB24 Take 225 mg by  mouth daily.     . vitamin C (ASCORBIC ACID) 500 MG tablet Take 500 mg by mouth daily.       Marland Kitchen aspirin  81 mg Per Tube Daily  . chlorhexidine gluconate (MEDLINE KIT)  15 mL Mouth Rinse BID  . enoxaparin (LOVENOX) injection  40 mg Subcutaneous Q24H  . feeding supplement (PRO-STAT SUGAR FREE 64)  30 mL Per Tube BID  . feeding supplement (VITAL HIGH PROTEIN)  1,000 mL Per Tube Q24H  . furosemide  40 mg Intravenous Q12H  . insulin aspart  0-15 Units Subcutaneous Q4H  . insulin glargine  45 Units Subcutaneous QHS  . ipratropium-albuterol  3 mL Nebulization Q4H  . mouth rinse  15 mL Mouth Rinse 10 times per day  . methylPREDNISolone (SOLU-MEDROL) injection  40 mg Intravenous Q12H  . multivitamin  15 mL Per Tube Daily  . pantoprazole sodium  40 mg Per Tube Daily  . rosuvastatin  40 mg Per Tube q1800  . venlafaxine  75 mg Per Tube TID    Infusions: . fentaNYL infusion INTRAVENOUS 250 mcg/hr (05/17/18 1328)  . norepinephrine (LEVOPHED) Adult infusion 6 mcg/min (05/17/18 1439)    Allergies  Allergen Reactions  . Colchicine     itching  . Sulfa Antibiotics Rash    itching  . Levaquin [Levofloxacin In D5w] Rash    Social History   Socioeconomic History  . Marital status: Single    Spouse name: Not on file  . Number of children: Not on file  . Years of education: Not on file  . Highest education level: Not on file  Occupational History  . Not on file  Social Needs  . Financial resource strain: Not on file  . Food insecurity:    Worry: Not on file    Inability: Not on file  . Transportation needs:    Medical: Not on file    Non-medical: Not on file  Tobacco Use  . Smoking status: Current Every Day Smoker    Packs/day: 1.00    Types: Cigarettes  . Smokeless tobacco: Never Used  Substance and Sexual Activity  . Alcohol use: No    Alcohol/week: 0.0 standard drinks  . Drug use: No  . Sexual activity: Never  Lifestyle  . Physical activity:    Days per week: Not on file     Minutes per session: Not on file  . Stress: Not on file  Relationships  . Social connections:    Talks on phone: Not on file    Gets together: Not on file    Attends religious service: Not on file    Active member of club or organization: Not on file    Attends  meetings of clubs or organizations: Not on file    Relationship status: Not on file  . Intimate partner violence:    Fear of current or ex partner: Not on file    Emotionally abused: Not on file    Physically abused: Not on file    Forced sexual activity: Not on file  Other Topics Concern  . Not on file  Social History Narrative  . Not on file    Family History  Problem Relation Age of Onset  . Diabetes Mother   . Cancer Father   . Diabetes Maternal Aunt   . Diabetes Maternal Aunt     PHYSICAL EXAM: Vitals:   05/17/18 1216 05/17/18 1300  BP:  (!) 63/47  Pulse:  93  Resp:  (!) 22  Temp:    SpO2: 94% 93%     Intake/Output Summary (Last 24 hours) at 05/17/2018 1502 Last data filed at 05/17/2018 1200 Gross per 24 hour  Intake 1463.92 ml  Output 1785 ml  Net -321.08 ml    General:  Well appearing. No respiratory difficulty HEENT: normal Neck: supple. no JVD. Carotids 2+ bilat; no bruits. No lymphadenopathy or thryomegaly appreciated. Cor: PMI nondisplaced. Regular rate & rhythm. No rubs, gallops or murmurs. Lungs: clear Abdomen: soft, nontender, nondistended. No hepatosplenomegaly. No bruits or masses. Good bowel sounds. Extremities: no cyanosis, clubbing, rash, edema Neuro: alert & oriented x 3, cranial nerves grossly intact. moves all 4 extremities w/o difficulty. Affect pleasant.  ECG: Sinus tachycardia about 120 bpm with nonspecific ST-T changes  Results for orders placed or performed during the hospital encounter of 05/07/2018 (from the past 24 hour(s))  Blood gas, arterial     Status: Abnormal   Collection Time: 05/11/2018  4:08 PM  Result Value Ref Range   FIO2 0.40    Delivery systems VENTILATOR     Mode PRESSURE REGULATED VOLUME CONTROL    VT 500 mL   LHR 18 resp/min   Peep/cpap 5.0 cm H20   pH, Arterial 7.31 (L) 7.350 - 7.450   pCO2 arterial 47 32.0 - 48.0 mmHg   pO2, Arterial 84 83.0 - 108.0 mmHg   Bicarbonate 23.7 20.0 - 28.0 mmol/L   Acid-base deficit 2.9 (H) 0.0 - 2.0 mmol/L   O2 Saturation 95.2 %   Patient temperature 37.0    Collection site REVIEWED BY    Sample type ARTERIAL DRAW    Allens test (pass/fail) PASS PASS  Troponin I - Now Then Q6H     Status: Abnormal   Collection Time: 05/10/2018  4:33 PM  Result Value Ref Range   Troponin I 0.14 (HH) <0.03 ng/mL  Glucose, capillary     Status: Abnormal   Collection Time: 05/30/2018  5:06 PM  Result Value Ref Range   Glucose-Capillary 226 (H) 70 - 99 mg/dL  Lactic acid, plasma     Status: None   Collection Time: 05/23/2018  8:03 PM  Result Value Ref Range   Lactic Acid, Venous 1.3 0.5 - 1.9 mmol/L  Troponin I - Now Then Q6H     Status: Abnormal   Collection Time: 05/30/2018  9:54 PM  Result Value Ref Range   Troponin I 0.15 (HH) <0.03 ng/mL  Lactic acid, plasma     Status: None   Collection Time: 05/12/2018  9:54 PM  Result Value Ref Range   Lactic Acid, Venous 1.2 0.5 - 1.9 mmol/L  Glucose, capillary     Status: Abnormal   Collection Time: 05/06/2018 10:48  PM  Result Value Ref Range   Glucose-Capillary 215 (H) 70 - 99 mg/dL   Comment 1 Call MD NNP PA CNM   Troponin I - Now Then Q6H     Status: Abnormal   Collection Time: 05/17/18  4:24 AM  Result Value Ref Range   Troponin I 0.17 (HH) <0.03 ng/mL  CBC     Status: Abnormal   Collection Time: 05/17/18  4:24 AM  Result Value Ref Range   WBC 5.6 4.0 - 10.5 K/uL   RBC 4.03 3.87 - 5.11 MIL/uL   Hemoglobin 10.7 (L) 12.0 - 15.0 g/dL   HCT 35.5 (L) 36.0 - 46.0 %   MCV 88.1 80.0 - 100.0 fL   MCH 26.6 26.0 - 34.0 pg   MCHC 30.1 30.0 - 36.0 g/dL   RDW 17.3 (H) 11.5 - 15.5 %   Platelets 107 (L) 150 - 400 K/uL   nRBC 0.0 0.0 - 0.2 %  Basic metabolic panel     Status:  Abnormal   Collection Time: 05/17/18  4:24 AM  Result Value Ref Range   Sodium 141 135 - 145 mmol/L   Potassium 4.4 3.5 - 5.1 mmol/L   Chloride 108 98 - 111 mmol/L   CO2 26 22 - 32 mmol/L   Glucose, Bld 172 (H) 70 - 99 mg/dL   BUN 33 (H) 8 - 23 mg/dL   Creatinine, Ser 1.51 (H) 0.44 - 1.00 mg/dL   Calcium 8.0 (L) 8.9 - 10.3 mg/dL   GFR calc non Af Amer 37 (L) >60 mL/min   GFR calc Af Amer 43 (L) >60 mL/min   Anion gap 7 5 - 15  Brain natriuretic peptide     Status: Abnormal   Collection Time: 05/17/18  4:24 AM  Result Value Ref Range   B Natriuretic Peptide 3,201.0 (H) 0.0 - 100.0 pg/mL  Glucose, capillary     Status: Abnormal   Collection Time: 05/17/18  7:29 AM  Result Value Ref Range   Glucose-Capillary 153 (H) 70 - 99 mg/dL  Glucose, capillary     Status: Abnormal   Collection Time: 05/17/18 11:44 AM  Result Value Ref Range   Glucose-Capillary 120 (H) 70 - 99 mg/dL   Dg Chest Port 1 View  Result Date: 05/17/2018 CLINICAL DATA:  Acute respiratory failure. EXAM: PORTABLE CHEST 1 VIEW COMPARISON:  05/09/2018 and 05/01/2018 FINDINGS: Endotracheal tube tip is 2.5 cm above the carina. NG tube tip is below the diaphragm. Chronic cardiomegaly. Pulmonary vascularity is now normal. Interstitial accentuation has improved. Slight residual atelectasis and scarring at the left lung base. IMPRESSION: Overall improved pulmonary vascularity and interstitial accentuation. Electronically Signed   By: Lorriane Shire M.D.   On: 05/17/2018 08:14   Dg Chest Portable 1 View  Result Date: 05/22/2018 CLINICAL DATA:  Endotracheal tube placement. EXAM: PORTABLE CHEST 1 VIEW COMPARISON:  Radiograph of same day. FINDINGS: Stable cardiomegaly. Atherosclerosis of thoracic aorta is noted. Endotracheal tube is in grossly good position. Nasogastric tube is seen entering the stomach with tip in proximal stomach. No pneumothorax is noted. Bilateral diffuse interstitial densities are noted concerning for pulmonary  edema. Small left pleural effusion may be present. Bony thorax is unremarkable. IMPRESSION: Stable cardiomegaly. Endotracheal and nasogastric tubes are in grossly good position. Stable bilateral pulmonary edema is noted with possible left pleural effusion. Aortic Atherosclerosis (ICD10-I70.0). Electronically Signed   By: Marijo Conception, M.D.   On: 05/14/2018 12:38   Dg Chest Portable 1 View  Result Date: 05/11/2018 CLINICAL DATA:  Shortness of breath. EXAM: PORTABLE CHEST 1 VIEW COMPARISON:  Chest radiograph 05/01/2018 FINDINGS: Monitoring leads overlie the patient. Stable cardiomegaly. Diffuse bilateral interstitial pulmonary opacities. Interval increase in bilateral mid and lower lung heterogeneous opacities. Probable tiny pleural effusions. IMPRESSION: Cardiomegaly with mild interstitial edema. Increase in basilar opacities may represent superimposed atelectasis, infection or focal edema. Electronically Signed   By: Lovey Newcomer M.D.   On: 05/27/2018 11:59     ASSESSMENT AND PLAN: Respiratory failure and intubation due to severe shortness of breath secondary to congestive heart failure/COPD with severe LV dysfunction.  No significant coronary artery disease on cardiac catheterization done last month but had ejection fraction 10 to 15% on echo in the hospital but follow-up echo on May 13, 2018 showed significant improvement of 25 to 30% ejection fraction.  Patient was compliant on her medications including Entresto carvedilol and spironolactone and Lasix.  She was wearing LifeVest on follow-up.  Now presents with respiratory failure.  Patient is very hypotensive and is unable to get any of the medications such as Entresto carvedilol and spironolactone and is on Lasix only.  Once patient is extubated and blood pressure becomes stable we will reintroduce these medications.  Her ejection fraction for 5 days ago was 25 to 30% on echo done in my office. KHAN,SHAUKAT A

## 2018-05-17 NOTE — Progress Notes (Signed)
Pharmacy Monitoring Consult:  Pharmacy consulted to assist in monitoring and replacing electrolytes in this 63 y.o. female admitted on May 19, 2018 with Shortness of Breath   Labs:  Sodium (mmol/L)  Date Value  05/17/2018 141  12/11/2013 136   Potassium (mmol/L)  Date Value  05/17/2018 4.4  04/10/2014 3.8   Magnesium (mg/dL)  Date Value  03/54/6568 2.4  12/04/2011 1.8   Calcium (mg/dL)  Date Value  12/75/1700 8.0 (L)   Calcium, Total (mg/dL)  Date Value  17/49/4496 8.5   Albumin (g/dL)  Date Value  75/91/6384 3.8  12/11/2013 3.2 (L)    Assessment/Plan: 1. Electrolytes: patient ordered furosemide 40mg  IV BID. No replacement warranted. Will check electrolytes with am labs.   2. Glucose: stop metformin. Change SSI to Q4hr. Decrease Lantus to 25 units. Patient initiated on tube feeds.   3. Constipation: initiate senna/docusate 1 tab BID.   Pharmacy will continue to monitor and adjust per consult.   Simpson,Michael L 05/17/2018 4:55 PM

## 2018-05-17 NOTE — Progress Notes (Signed)
Inpatient Diabetes Program Recommendations  AACE/ADA: New Consensus Statement on Inpatient Glycemic Control  Target Ranges:  Prepandial:   less than 140 mg/dL      Peak postprandial:   less than 180 mg/dL (1-2 hours)      Critically ill patients:  140 - 180 mg/dL   Results for Alexis Ray, Alexis W (MRN [id number]) as of 05/17/2018 08:48  Ref. Range 05/06/2018 11:28 06/02/2018 14:01 05/08/2018 16:50 05/08/2018 17:06 05/18/2018 22:48 05/17/2018 07:29  Glucose-Capillary Latest Ref Range: 70 - 99 mg/dL 782260 (H)  Solumedrol 956125 @ 11:20 218 (H)   Solumedrol 60 mg 226 (H)  Novolog 3 units  Metformin 500 mg 215 (H)  Novolog 2 units  Lantus 45 units 153 (H)  Novolog 2 units  Metformin 500 mg   Review of Glycemic Control  Diabetes history: DM2 Outpatient Diabetes medications: Lantus 45 units QHS, Novolog 10 units TID with meals, Metformin 500 mg BID, Trulicity 1.5 mg Qweek (Monday) Current orders for Inpatient glycemic control: Lantus 45 units QHS, Novolog 0-9 units TID with meals, Novolog 0-5 units QHS, Metformin 500 mg BID, Trulicity (Dulaglutide) 1.5 mg Qweek (Monday)  Inpatient Diabetes Program Recommendations:  Insulin - Basal: Fasting glucose 153 mg/dl today. Patient recieved 2 doses of Solumedrol on 05/09/2018 and no other steroids ordered. May want to consider decreasing Lantus to 35 units QHS. Correction (SSI): Please consider changing CBGs and Novolog 0-9 units to Q4H since patient is NPO and on the ventilator. Oral Agents: Please consider discontining Metformin while inpatient. Injectables:  Please consider discontinuing Dulaglutide while inpatient (not on hospital formulary).  Thanks, Orlando PennerMarie Jenella Craigie, RN, MSN, CDE Diabetes Coordinator Inpatient Diabetes Program [phone] (Team Pager from 8am to 5pm)

## 2018-05-17 NOTE — Progress Notes (Addendum)
Palliative Note:   Referral received for goals of care discussion. Assessed patient at bedside. No family present. I spoke with Caryl Pina, patient's daughter. I introduced myself and Palliative Medicine to [facility].  She verbalized understanding and appreciation.   She advised she and her sister may be unable to meet at the same time due to both of their different work schedules however, Caryl Pina voiced she would be ok if I met with her sister,  Driante. Caryl Pina expressed she would get in contact with her sister and see if she was ok meeting alone. She stated she may be able to meet with myself and her sister on Wednesday if her sister really wanted her to be present in conversations. I also offered to meet with sister and could contact [place] via phone. Caryl Pina is going to discuss with her employer and also with her sister. She has my number and plans to give me a call back with their meeting times and arrangements.   Detailed note and recommendations to follow goals of care discussion.   Thank you for your referral.   Alda Lea, AGPCNP-BC Palliative Medicine Team  Pager: [phone] Amion: [place]

## 2018-05-17 NOTE — Consult Note (Signed)
Called to see patient. Appears that she is followed by Dr. Welton Flakes. Will let him know.

## 2018-05-17 NOTE — Progress Notes (Signed)
Pt intubated and sedated (see MAR),  not following commands, withdrawals to pain. Vitals stable. Will continue to monitor.

## 2018-05-18 ENCOUNTER — Inpatient Hospital Stay: Payer: Self-pay

## 2018-05-18 ENCOUNTER — Inpatient Hospital Stay: Payer: Medicare HMO

## 2018-05-18 DIAGNOSIS — J441 Chronic obstructive pulmonary disease with (acute) exacerbation: Secondary | ICD-10-CM

## 2018-05-18 DIAGNOSIS — J181 Lobar pneumonia, unspecified organism: Secondary | ICD-10-CM

## 2018-05-18 LAB — BASIC METABOLIC PANEL
ANION GAP: 11 (ref 5–15)
Anion gap: 9 (ref 5–15)
BUN: 64 mg/dL — ABNORMAL HIGH (ref 8–23)
BUN: 87 mg/dL — ABNORMAL HIGH (ref 8–23)
CO2: 23 mmol/L (ref 22–32)
CO2: 25 mmol/L (ref 22–32)
Calcium: 8.1 mg/dL — ABNORMAL LOW (ref 8.9–10.3)
Calcium: 7.7 mg/dL — ABNORMAL LOW (ref 8.9–10.3)
Chloride: 107 mmol/L (ref 98–111)
Chloride: 106 mmol/L (ref 98–111)
Creatinine, Ser: 2.72 mg/dL — ABNORMAL HIGH (ref 0.44–1.00)
Creatinine, Ser: 3.95 mg/dL — ABNORMAL HIGH (ref 0.44–1.00)
GFR calc Af Amer: 21 mL/min — ABNORMAL LOW (ref >60)
GFR calc Af Amer: 13 mL/min — ABNORMAL LOW (ref >60)
GFR calc non Af Amer: 18 mL/min — ABNORMAL LOW (ref >60)
GFR calc non Af Amer: 12 mL/min — ABNORMAL LOW (ref >60)
Glucose, Bld: 216 mg/dL — ABNORMAL HIGH (ref 70–99)
Glucose, Bld: 292 mg/dL — ABNORMAL HIGH (ref 70–99)
Potassium: 5 mmol/L (ref 3.5–5.1)
Potassium: 6.2 mmol/L — ABNORMAL HIGH (ref 3.5–5.1)
Sodium: 141 mmol/L (ref 135–145)
Sodium: 140 mmol/L (ref 135–145)

## 2018-05-18 LAB — GLUCOSE, CAPILLARY
Glucose-Capillary: 183 mg/dL — ABNORMAL HIGH (ref 70–99)
Glucose-Capillary: 179 mg/dL — ABNORMAL HIGH (ref 70–99)
Glucose-Capillary: 203 mg/dL — ABNORMAL HIGH (ref 70–99)
Glucose-Capillary: 262 mg/dL — ABNORMAL HIGH (ref 70–99)
Glucose-Capillary: 264 mg/dL — ABNORMAL HIGH (ref 70–99)
Glucose-Capillary: 300 mg/dL — ABNORMAL HIGH (ref 70–99)
Glucose-Capillary: 287 mg/dL — ABNORMAL HIGH (ref 70–99)
Glucose-Capillary: 280 mg/dL — ABNORMAL HIGH (ref 70–99)
Glucose-Capillary: 227 mg/dL — ABNORMAL HIGH (ref 70–99)

## 2018-05-18 LAB — BLOOD GAS, ARTERIAL
Acid-base deficit: 1.4 mmol/L (ref 0.0–2.0)
Bicarbonate: 27.2 mmol/L (ref 20.0–28.0)
FIO2: 50
MECHVT: 500 mL
Mechanical Rate: 18
O2 Saturation: 92.8 %
PEEP: 5 cmH2O
Patient temperature: 37
pCO2 arterial: 62 mmHg — ABNORMAL HIGH (ref 32.0–48.0)
pH, Arterial: 7.25 — ABNORMAL LOW (ref 7.350–7.450)
pO2, Arterial: 77 mmHg — ABNORMAL LOW (ref 83.0–108.0)

## 2018-05-18 LAB — PHOSPHORUS: Phosphorus: 7.2 mg/dL — ABNORMAL HIGH (ref 2.5–4.6)

## 2018-05-18 LAB — MAGNESIUM: MAGNESIUM: 2.4 mg/dL (ref 1.7–2.4)

## 2018-05-18 MED ORDER — HEPARIN SODIUM (PORCINE) 5000 UNIT/ML IJ SOLN
5000.0000 [IU] | Freq: Three times a day (TID) | INTRAMUSCULAR | Status: DC
  Administered 2018-05-18 – 2018-05-19 (×2): 5000 [IU] via SUBCUTANEOUS
  Filled 2018-05-18 (×2): qty 1

## 2018-05-18 MED ORDER — CALCIUM GLUCONATE-NACL 1-0.675 GM/50ML-% IV SOLN
1.0000 g | Freq: Once | INTRAVENOUS | Status: AC
  Administered 2018-05-18: 1000 mg via INTRAVENOUS
  Filled 2018-05-18: qty 50

## 2018-05-18 MED ORDER — PHENYLEPHRINE HCL-NACL 10-0.9 MG/250ML-% IV SOLN
25.0000 ug/min | INTRAVENOUS | Status: DC
  Filled 2018-05-18: qty 250

## 2018-05-18 MED ORDER — SODIUM CHLORIDE 0.9% FLUSH: 10.0000 mL | INTRAVENOUS | Status: DC | PRN

## 2018-05-18 MED ORDER — SODIUM POLYSTYRENE SULFONATE 15 GM/60ML PO SUSP
60.0000 g | Freq: Once | ORAL | Status: AC
  Administered 2018-05-18: 60 g
  Filled 2018-05-18: qty 240

## 2018-05-18 MED ORDER — SODIUM CHLORIDE 0.9 % IV SOLN
1.0000 g | Freq: Every day | INTRAVENOUS | Status: DC
  Administered 2018-05-18: 1 g via INTRAVENOUS
  Filled 2018-05-18: qty 1
  Filled 2018-05-18: qty 10

## 2018-05-18 MED ORDER — SODIUM CHLORIDE 0.9 % IV SOLN
25.0000 ug/min | INTRAVENOUS | Status: DC
  Administered 2018-05-18: 50 ug/min via INTRAVENOUS
  Administered 2018-05-18 (×3): 60 ug/min via INTRAVENOUS
  Administered 2018-05-19: 100 ug/min via INTRAVENOUS
  Administered 2018-05-19: 50 ug/min via INTRAVENOUS
  Administered 2018-05-19 (×3): 60 ug/min via INTRAVENOUS
  Administered 2018-05-20: 40 ug/min via INTRAVENOUS
  Filled 2018-05-18 (×3): qty 1
  Filled 2018-05-18 (×3): qty 10
  Filled 2018-05-18: qty 1
  Filled 2018-05-18 (×2): qty 10
  Filled 2018-05-18: qty 1
  Filled 2018-05-18: qty 10
  Filled 2018-05-18: qty 1

## 2018-05-18 MED ORDER — VECURONIUM BROMIDE 10 MG IV SOLR
10.0000 mg | INTRAVENOUS | Status: DC | PRN
  Administered 2018-05-18 – 2018-05-19 (×2): 10 mg via INTRAVENOUS
  Filled 2018-05-18 (×2): qty 10

## 2018-05-18 MED ORDER — LORAZEPAM 2 MG/ML IJ SOLN
1.0000 mg | INTRAMUSCULAR | Status: DC | PRN
  Administered 2018-05-18 – 2018-05-20 (×2): 2 mg via INTRAVENOUS
  Filled 2018-05-18 (×3): qty 1

## 2018-05-18 MED ORDER — INSULIN ASPART 100 UNIT/ML ~~LOC~~ SOLN
2.0000 [IU] | SUBCUTANEOUS | Status: DC
  Administered 2018-05-18 – 2018-05-19 (×7): 2 [IU] via SUBCUTANEOUS
  Filled 2018-05-18 (×6): qty 1

## 2018-05-18 MED ORDER — SODIUM CHLORIDE 0.9 % IV SOLN
500.0000 mg | Freq: Every day | INTRAVENOUS | Status: DC
  Administered 2018-05-18 – 2018-05-20 (×3): 500 mg via INTRAVENOUS
  Filled 2018-05-18 (×3): qty 500

## 2018-05-18 MED ORDER — VECURONIUM BROMIDE 10 MG IV SOLR
10.0000 mg | Freq: Once | INTRAVENOUS | Status: AC
  Administered 2018-05-18: 10 mg via INTRAVENOUS

## 2018-05-18 MED ORDER — SODIUM CHLORIDE 0.9 % IV SOLN
250.0000 mL | INTRAVENOUS | Status: DC
  Administered 2018-05-18: 250 mL via INTRAVENOUS

## 2018-05-18 MED ORDER — VECURONIUM BROMIDE 10 MG IV SOLR
INTRAVENOUS | Status: AC
  Filled 2018-05-18: qty 10

## 2018-05-18 MED ORDER — DEXTROSE 50 % IV SOLN
1.0000 | Freq: Once | INTRAVENOUS | Status: AC
  Administered 2018-05-18: 50 mL via INTRAVENOUS
  Filled 2018-05-18: qty 50

## 2018-05-18 MED ORDER — INSULIN REGULAR HUMAN 100 UNIT/ML IJ SOLN
10.0000 [IU] | Freq: Once | INTRAMUSCULAR | Status: AC
  Administered 2018-05-18: 10 [IU] via INTRAVENOUS
  Filled 2018-05-18: qty 10

## 2018-05-18 MED ORDER — SODIUM CHLORIDE 0.9% FLUSH
10.0000 mL | Freq: Two times a day (BID) | INTRAVENOUS | Status: DC
  Administered 2018-05-18 – 2018-05-19 (×3): 10 mL

## 2018-05-18 NOTE — Progress Notes (Signed)
Palliative:  I have spoken with daughter, [name], and we plan to meet together with her sister 05/19/18 at 0930 am. Thank you for this consult.   No charge  Yong Channel, NP Palliative Medicine Team Pager # [phone] (M-F 8a-5p) Team Phone # [phone] (Nights/Weekends)

## 2018-05-18 NOTE — Progress Notes (Signed)
[employer] - [place] at [facility]   PATIENT NAME: Alexis Ray    MR#:  [id number]  DATE OF BIRTH:  July 29, 1955  SUBJECTIVE:  CHIEF COMPLAINT:   Chief Complaint  Patient presents with  . Shortness of Breath   No new complaint this morning.  Patient remains intubated and sedated.  Critical care physician managing patient  REVIEW OF SYSTEMS:  ROS  Unobtainable due to patient being sedated on vent  DRUG ALLERGIES:   Allergies  Allergen Reactions  . Colchicine     itching  . Sulfa Antibiotics Rash    itching  . Levaquin [Levofloxacin In D5w] Rash   VITALS:  Blood pressure 114/71, pulse 93, temperature 99.1 F (37.3 C), temperature source Axillary, resp. rate 12, height 5\' 8"  (1.727 m), weight 88.6 kg, SpO2 93 %. PHYSICAL EXAMINATION:  GENERAL:  63 y.o.-year-old patient lying in the bed, intubated, critically ill appearing EYES: Pupils equal, round, reactive to light and accommodation. No scleral icterus. Extraocular muscles intact.  HEENT: Head atraumatic, normocephalic. Oropharynx and nasopharynx clear.  NECK:  Supple, no jugular venous distention. No thyroid enlargement, no tenderness.  LUNGS: Scant breath sounds bilaterally, bibasilar scattered wheezes heard.  No rails or rhonchi.  Using accessory muscles to breathe in spite of being on the ventilator CARDIOVASCULAR: S1, S2 normal. No murmurs, rubs, or gallops.  ABDOMEN: Soft, obese, nontender, nondistended. Bowel sounds present. No organomegaly or mass.  EXTREMITIES: No pedal edema, cyanosis, or clubbing.  NEUROLOGIC: is sedated.,  Unable to do a complete neuro exam PSYCHIATRIC: The patient is sedated.  SKIN: No obvious rash,   LABORATORY PANEL:  Female CBC Recent Labs  Lab 05/17/18 0424  WBC 5.6  HGB 10.7*  HCT 35.5*  PLT 107*   ------------------------------------------------------------------------------------------------------------------ Chemistries  Recent Labs  Lab 18-May-2018 1111   05/18/18 0520  NA 137   < > 141  K 5.1   < > 5.0  CL 102   < > 107  CO2 23   < > 23  GLUCOSE 288*   < > 216*  BUN 27*   < > 64*  CREATININE 1.78*   < > 2.72*  CALCIUM 8.5*   < > 8.1*  MG  --   --  2.4  AST 38  --   --   ALT 42  --   --   ALKPHOS 83  --   --   BILITOT 0.8  --   --    < > = values in this interval not displayed.   RADIOLOGY:  Dg Chest Port 1 View  Result Date: 05/18/2018 CLINICAL DATA:  Acute respiratory failure EXAM: PORTABLE CHEST 1 VIEW COMPARISON:  Yesterday FINDINGS: Endotracheal tube tip is below the clavicular heads. The carina is difficult to visualize but estimate that the tip terminates 18 mm above the carina. The orogastric tube at least reaches the stomach. Cardiomegaly and vascular pedicle widening. Low volume chest with diffuse interstitial opacity and mild streaky density at the bases. IMPRESSION: 1. Stable hardware positioning. The endotracheal tube tip is 18 mm above the carina. 2. Cardiomegaly and vascular congestion. Streaky density at the bases that could be atelectasis or infection. Electronically Signed   By: Marnee Spring M.D.   On: 05/18/2018 07:05   [place] Ekg Site Rite  Result Date: 05/18/2018 If Site Rite image not attached, placement could not be confirmed due to current cardiac rhythm.  ASSESSMENT AND PLAN:    Alexis Ray  is a 63  y.o. female with a known history of chronic respiratory failure secondary to COPD with ongoing smoking on 4 L home oxygen, chronic systolic heart failure with last known ejection fraction less than 15%, diabetes mellitus, hypertension and GERD presents to hospital secondary to worsening shortness of breath.  1.  Acute hypoxic respiratory failure-secondary to COPD exacerbation and CHF exacerbation -Intubated, currently on ventilator.  Gradually weaning down FiO2.  Currently at 50%. -Admit to ICU, intensivist consult for vent management -Started on IV steroids, inhalers/nebulizers. Patient status post bronchoscopy  by critical care physician on 05/17/2018 which showed extensive mucous and purulent secretions.  2.  Acute on chronic CHF exacerbation-systolic dysfunction with EF less than 15% Patient seen by cardiologist.  Patient said to have been compliant with her medications and was wearing LifeVest on follow-up.  Plan is for once patient gets extubated and blood pressure stable, to resume medications. Per cardiology documentation recent ejection fraction on recent 2D echocardiogram done about 5 days prior to this admission in cardiologist office was 25 to 30%.   Started on IV Lasix and monitor BNP -Continue cardiac medications including aspirin, statin, Coreg, aldactone and Entresto Noted palliative care consult placed.  Follow-up on the meeting with family tomorrow.  3. . Acute kidney injury superimposed on CKD stage III- Creatinine elevated from 1.51 yesterday to 2.72 today.  Lasix appears to have been discontinued.  4.  Diabetes mellitus-continue home medications.  Monitor with sliding scale insulin especially while on steroids.  5.  GERD-Protonix    DVT prophylaxis-on heparin   All the records are reviewed and case discussed with Care Management/Social Worker. Management plans discussed with the patient, family and they are in agreement.  CODE STATUS: Full Code  TOTAL TIME TAKING CARE OF THIS PATIENT: 34 minutes.   More than 50% of the time was spent in counseling/coordination of care: YES  POSSIBLE D/C IN 3 DAYS, DEPENDING ON CLINICAL CONDITION.   Alexis Ray M.D on 05/18/2018 at 3:30 PM  Between 7am to 6pm - Pager - [phone]  After 6pm go to [email or web address] - Social research officer, governmentpassword [email or web address]  [facility] [place] [facility]  Office  [phone]  CC: Primary care physician; Keane PoliceNoorani, Sezmin S, MD  Note: This dictation was prepared with Dragon dictation along with smaller phrase technology. Any transcriptional errors that result from this process are unintentional.

## 2018-05-18 NOTE — Progress Notes (Signed)
SUBJECTIVE: Patient remains intubated and sedated   Vitals:   05/18/18 0700 05/18/18 0800 05/18/18 0831 05/18/18 0858  BP: 104/72     Pulse: 98     Resp: 18     Temp:  99.3 F (37.4 C)    TempSrc:  Axillary    SpO2: 90%  92% 94%  Weight:      Height:        Intake/Output Summary (Last 24 hours) at 05/18/2018 0959 Last data filed at 05/18/2018 4536 Gross per 24 hour  Intake 521.16 ml  Output 565 ml  Net -43.84 ml    LABS: Basic Metabolic Panel: Recent Labs    05/17/18 0424 05/18/18 0520  NA 141 141  K 4.4 5.0  CL 108 107  CO2 26 23  GLUCOSE 172* 216*  BUN 33* 64*  CREATININE 1.51* 2.72*  CALCIUM 8.0* 8.1*  MG  --  2.4  PHOS  --  7.2*   Liver Function Tests: Recent Labs    05/25/2018 1111  AST 38  ALT 42  ALKPHOS 83  BILITOT 0.8  PROT 7.5  ALBUMIN 3.8   No results for input(s): LIPASE, AMYLASE in the last 72 hours. CBC: Recent Labs    05/17/2018 1111 05/17/18 0424  WBC 9.7 5.6  NEUTROABS 7.9*  --   HGB 11.8* 10.7*  HCT 40.1 35.5*  MCV 89.3 88.1  PLT 162 107*   Cardiac Enzymes: Recent Labs    06/02/2018 1633 05/31/2018 2154 05/17/18 0424  TROPONINI 0.14* 0.15* 0.17*   BNP: Invalid input(s): POCBNP D-Dimer: No results for input(s): DDIMER in the last 72 hours. Hemoglobin A1C: No results for input(s): HGBA1C in the last 72 hours. Fasting Lipid Panel: Recent Labs    05/17/18 2014  TRIG 95   Thyroid Function Tests: No results for input(s): TSH, T4TOTAL, T3FREE, THYROIDAB in the last 72 hours.  Invalid input(s): FREET3 Anemia Panel: No results for input(s): VITAMINB12, FOLATE, FERRITIN, TIBC, IRON, RETICCTPCT in the last 72 hours.   PHYSICAL EXAM General: Well developed, well nourished, in no acute distress HEENT:  Normocephalic and atramatic Neck:  No JVD.  Lungs: Clear bilaterally to auscultation and percussion. Heart: HRRR . Normal S1 and S2 without gallops or murmurs.  Abdomen: Bowel sounds are positive, abdomen soft and non-tender   Msk:  Back normal, normal gait. Normal strength and tone for age. Extremities: No clubbing, cyanosis or edema.   Neuro: Alert and oriented X 3. Psych:  Good affect, responds appropriately  TELEMETRY: Sinus rhythm  ASSESSMENT AND PLAN: Respiratory failure with intubation and history of severe LV dysfunction with left ventricular ejection fraction 25% on echocardiogram on May 13, 2018.  Blood pressure not stable enough to restart Entresto carvedilol and Spironolactone.  Once extubated will restart this medication.  Active Problems:   Acute respiratory failure (HCC)    Erleen Egner A, MD, [facility] 05/18/2018 9:59 AM

## 2018-05-18 NOTE — Progress Notes (Signed)
Inpatient Diabetes Program Recommendations  AACE/ADA: New Consensus Statement on Inpatient Glycemic Control   Target Ranges:  Prepandial:   less than 140 mg/dL      Peak postprandial:   less than 180 mg/dL (1-2 hours)      Critically ill patients:  140 - 180 mg/dL   Results for Alexis Ray, Alexis Ray (MRN [id number]) as of 05/18/2018 09:59  Ref. Range 05/17/2018 07:29 05/17/2018 11:44 05/17/2018 15:30 05/17/2018 19:19 05/17/2018 21:14 05/17/2018 23:23 05/18/2018 03:38 05/18/2018 07:31  Glucose-Capillary Latest Ref Range: 70 - 99 mg/dL 212 (H) 248 (H) 250 (H) 213 (H) 203 (H) 183 (H) 179 (H) 203 (H)   Review of Glycemic Control history: DM2 Outpatient Diabetes medications: Lantus 45 units QHS, Novolog 10 units TID with meals, Metformin 500 mg BID, Trulicity 1.5 mg Qweek (Monday) Current orders for Inpatient glycemic control: Lantus 25 units QHS, Novolog 0-15 units Q4H; Solumedrol 40 mg Q12H   Inpatient Diabetes Program Recommendations:  Insulin-Tube Feeding Coverage: Please consider ordering Novolog 2 units Q4H for tube feeding coverage.   Thanks, Orlando Penner, RN, MSN, CDE Diabetes Coordinator Inpatient Diabetes Program [phone] (Team Pager from 8am to 5pm)

## 2018-05-18 NOTE — Progress Notes (Signed)
Peripherally Inserted Central Catheter/Midline Placement  The IV Nurse has discussed with the patient and/or persons authorized to consent for the patient, the purpose of this procedure and the potential benefits and risks involved with this procedure.  The benefits include less needle sticks, lab draws from the catheter, and the patient may be discharged home with the catheter. Risks include, but not limited to, infection, bleeding, blood clot (thrombus formation), and puncture of an artery; nerve damage and irregular heartbeat and possibility to perform a PICC exchange if needed/ordered by physician.  Alternatives to this procedure were also discussed.  Bard Power PICC patient education guide, fact sheet on infection prevention and patient information card has been provided to patient /or left at bedside.    PICC/Midline Placement Documentation  PICC Triple Lumen 05/18/18 PICC Right Brachial 41 cm 0 cm (Active)  Indication for Insertion or Continuance of Line Limited venous access - need for IV therapy >5 days (PICC only) 05/18/2018  8:23 PM  Exposed Catheter (cm) 0 cm 05/18/2018  8:23 PM  Site Assessment Clean;Dry;Intact 05/18/2018  8:23 PM  Lumen #1 Status Blood return noted;Flushed;Saline locked 05/18/2018  8:23 PM  Lumen #2 Status Blood return noted;Flushed;Saline locked 05/18/2018  8:23 PM  Lumen #3 Status Blood return noted;Flushed;Saline locked 05/18/2018  8:23 PM  Dressing Type Transparent;Occlusive;Securing device 05/18/2018  8:23 PM  Dressing Status Clean;Dry;Intact;Antimicrobial disc in place 05/18/2018  8:23 PM  Line Adjustment (NICU/IV Team Only) No 05/18/2018  8:23 PM  Dressing Intervention New dressing 05/18/2018  8:23 PM  Dressing Change Due 05/25/18 05/18/2018  8:23 PM       Netta Corrigan L 05/18/2018, 8:42 PM

## 2018-05-18 NOTE — Progress Notes (Signed)
Pharmacy Monitoring Consult:  Pharmacy consulted to assist in monitoring and replacing electrolytes in this 63 y.o. female admitted on 06/13/2018 with Shortness of Breath  Patient currently requiring mechanical ventilation and is sedated on continuous propofol and fentanyl.   Labs:  Sodium (mmol/L)  Date Value  05/18/2018 141  12/11/2013 136   Potassium (mmol/L)  Date Value  05/18/2018 5.0  04/10/2014 3.8   Magnesium (mg/dL)  Date Value  79/48/0165 2.4  12/04/2011 1.8   Phosphorus (mg/dL)  Date Value  53/74/8270 7.2 (H)   Calcium (mg/dL)  Date Value  78/67/5449 8.1 (L)   Calcium, Total (mg/dL)  Date Value  20/02/711 8.5   Albumin (g/dL)  Date Value  19/75/8832 3.8  12/11/2013 3.2 (L)    Assessment/Plan: 1. Electrolytes: Serum creatinine up overnight. Furosemide discontinued. Will obtain follow up electrolytes with at 1600. Will obtain all electrolytes with am labs.   2. Glucose: Patient on tube feeds. Continue SSI Q4hr. Continue Lantus 25 units. Per diabetes team recommendations, will start Novolog 2units Q4hr for tube feed coverage.   3. Constipation: Continue senna/docusate 1 tab BID; if no bowel movement overnight, will advance regimen.   Pharmacy will continue to monitor and adjust per consult.   Alexis Ray L 05/18/2018 1:33 PM

## 2018-05-18 NOTE — Progress Notes (Signed)
CRITICAL CARE NOTE  CC  Follow up resp failure  SUBJECTIVE Remains critically ill On vent support Bronch shows extensive mucus and purulent secretions +wheezing fio2 at 50%      SIGNIFICANT EVENTS COPD on 4 L [place] at home EF 15%   Failed biPAP 1/12 -intubated on MV support 1/13 BRONCH-pneumonia 1/14 vent support   BP 104/72   Pulse 98   Temp 100 F (37.8 C) (Axillary)   Resp 18   Ht _0  (1.727 m)   Wt 88.6 kg   SpO2 90%   BMI 29.70 kg/m    REVIEW OF SYSTEMS  PATIENT IS UNABLE TO PROVIDE COMPLETE REVIEW OF SYSTEM S DUE TO SEVERE CRITICAL ILLNESS AND ENCEPHALOPATHY   PHYSICAL EXAMINATION:  GENERAL:critically ill appearing, +resp distress HEAD: Normocephalic, atraumatic.  EYES: Pupils equal, round, reactive to light.  No scleral icterus.  MOUTH: Moist mucosal membrane. NECK: Supple. No thyromegaly. No nodules. No JVD.  PULMONARY: +rhonchi, +wheezing CARDIOVASCULAR: S1 and S2. Regular rate and rhythm. No murmurs, rubs, or gallops.  GASTROINTESTINAL: Soft, nontender, -distended. No masses. Positive bowel sounds. No hepatosplenomegaly.  MUSCULOSKELETAL: No swelling, clubbing, or edema.  NEUROLOGIC: obtunded SKIN:intact,warm,dry       Indwelling Urinary Catheter continued, requirement due to   Reason to continue Indwelling Urinary Catheter for strict Intake/Output monitoring for hemodynamic instability         Ventilator continued, requirement due to, resp failure    Ventilator Sedation RASS 0 to -2     ASSESSMENT AND PLAN SYNOPSIS  Severe Hypoxic and Hypercapnic Respiratory Failure Pneumonia and COPD exacerbation -continue Mechanical Ventilator support -continue Bronchodilator Therapy -Wean Fio2 and PEEP as tolerated -VAP/VENT bundle implementation -will perform SAT/SBT when respiratory parameters are met   SEVERE COPD EXACERBATION -continue IV steroids as prescribed -continue NEB THERAPY as prescribed -morphine as needed -wean fio2  as needed and tolerated    CARDIAC FAILURE-EF 35% -oxygen as needed -Lasix as tolerated -follow up cardiac enzymes as indicated   Renal Failure- -follow chem 7 -follow UO -continue Foley Catheter-assess need    NEUROLOGY - intubated and sedated - minimal sedation to achieve a RASS goal: -1  INFECTIOUS DISEASE -continue antibiotics as prescribed -follow up cultures    GI/Nutrition GI PROPHYLAXIS as indicated DIET-->TF's as tolerated Constipation protocol as indicated  ENDO - ICU hypoglycemic\Hyperglycemia protocol -check FSBS per protocol    DVT/GI PRX ordered TRANSFUSIONS AS NEEDED MONITOR FSBS ASSESS the need for LABS as needed    Critical Care Time devoted to patient care services described in this note is 40 minutes.   Overall, patient is critically ill, prognosis is guarded.   high risk for cardiac arrest and death.    Corrin Parker, M.D.  [facility]  Medical Director [place] Director [facility] Cardio-Pulmonary Department

## 2018-05-19 ENCOUNTER — Inpatient Hospital Stay: Payer: Medicare HMO

## 2018-05-19 DIAGNOSIS — Z7189 Other specified counseling: Secondary | ICD-10-CM

## 2018-05-19 DIAGNOSIS — N19 Unspecified kidney failure: Secondary | ICD-10-CM

## 2018-05-19 DIAGNOSIS — N179 Acute kidney failure, unspecified: Secondary | ICD-10-CM

## 2018-05-19 DIAGNOSIS — N183 Chronic kidney disease, stage 3 (moderate): Secondary | ICD-10-CM

## 2018-05-19 DIAGNOSIS — Z515 Encounter for palliative care: Secondary | ICD-10-CM

## 2018-05-19 DIAGNOSIS — J181 Lobar pneumonia, unspecified organism: Secondary | ICD-10-CM

## 2018-05-19 DIAGNOSIS — J9602 Acute respiratory failure with hypercapnia: Secondary | ICD-10-CM

## 2018-05-19 LAB — BLOOD GAS, ARTERIAL
Acid-base deficit: 4.5 mmol/L — ABNORMAL HIGH (ref 0.0–2.0)
Bicarbonate: 24.4 mmol/L (ref 20.0–28.0)
FIO2: 1
Mechanical Rate: 12
O2 Saturation: 99.8 %
PATIENT TEMPERATURE: 37
PEEP: 15 cmH2O
PIP: 18 cmH2O
pCO2 arterial: 61 mmHg — ABNORMAL HIGH (ref 32.0–48.0)
pH, Arterial: 7.21 — ABNORMAL LOW (ref 7.350–7.450)
pO2, Arterial: 280 mmHg — ABNORMAL HIGH (ref 83.0–108.0)

## 2018-05-19 LAB — BASIC METABOLIC PANEL
Anion gap: 7 (ref 5–15)
BUN: 99 mg/dL — ABNORMAL HIGH (ref 8–23)
CO2: 24 mmol/L (ref 22–32)
Calcium: 7.6 mg/dL — ABNORMAL LOW (ref 8.9–10.3)
Chloride: 109 mmol/L (ref 98–111)
Creatinine, Ser: 4.37 mg/dL — ABNORMAL HIGH (ref 0.44–1.00)
GFR calc Af Amer: 12 mL/min — ABNORMAL LOW (ref >60)
GFR calc non Af Amer: 10 mL/min — ABNORMAL LOW (ref >60)
Glucose, Bld: 203 mg/dL — ABNORMAL HIGH (ref 70–99)
POTASSIUM: 5.7 mmol/L — AB (ref 3.5–5.1)
Sodium: 140 mmol/L (ref 135–145)

## 2018-05-19 LAB — RENAL FUNCTION PANEL
ANION GAP: 7 (ref 5–15)
Albumin: 2.4 g/dL — ABNORMAL LOW (ref 3.5–5.0)
Albumin: 2.5 g/dL — ABNORMAL LOW (ref 3.5–5.0)
Albumin: 2.3 g/dL — ABNORMAL LOW (ref 3.5–5.0)
Anion gap: 7 (ref 5–15)
Anion gap: 6 (ref 5–15)
BUN: 79 mg/dL — ABNORMAL HIGH (ref 8–23)
BUN: 72 mg/dL — ABNORMAL HIGH (ref 8–23)
BUN: 57 mg/dL — AB (ref 8–23)
CO2: 22 mmol/L (ref 22–32)
CO2: 21 mmol/L — ABNORMAL LOW (ref 22–32)
CO2: 21 mmol/L — ABNORMAL LOW (ref 22–32)
Calcium: 7.2 mg/dL — ABNORMAL LOW (ref 8.9–10.3)
Calcium: 7.1 mg/dL — ABNORMAL LOW (ref 8.9–10.3)
Calcium: 6.9 mg/dL — ABNORMAL LOW (ref 8.9–10.3)
Chloride: 112 mmol/L — ABNORMAL HIGH (ref 98–111)
Chloride: 115 mmol/L — ABNORMAL HIGH (ref 98–111)
Chloride: 113 mmol/L — ABNORMAL HIGH (ref 98–111)
Creatinine, Ser: 3.74 mg/dL — ABNORMAL HIGH (ref 0.44–1.00)
Creatinine, Ser: 3.16 mg/dL — ABNORMAL HIGH (ref 0.44–1.00)
Creatinine, Ser: 2.56 mg/dL — ABNORMAL HIGH (ref 0.44–1.00)
GFR calc Af Amer: 14 mL/min — ABNORMAL LOW (ref >60)
GFR calc Af Amer: 23 mL/min — ABNORMAL LOW (ref >60)
GFR calc non Af Amer: 12 mL/min — ABNORMAL LOW (ref >60)
GFR calc non Af Amer: 15 mL/min — ABNORMAL LOW (ref >60)
GFR calc non Af Amer: 19 mL/min — ABNORMAL LOW (ref >60)
GFR, EST AFRICAN AMERICAN: 18 mL/min — AB (ref >60)
Glucose, Bld: 161 mg/dL — ABNORMAL HIGH (ref 70–99)
Glucose, Bld: 162 mg/dL — ABNORMAL HIGH (ref 70–99)
Glucose, Bld: 144 mg/dL — ABNORMAL HIGH (ref 70–99)
POTASSIUM: 5.1 mmol/L (ref 3.5–5.1)
Phosphorus: 7 mg/dL — ABNORMAL HIGH (ref 2.5–4.6)
Phosphorus: 5.7 mg/dL — ABNORMAL HIGH (ref 2.5–4.6)
Phosphorus: 4.9 mg/dL — ABNORMAL HIGH (ref 2.5–4.6)
Potassium: 4.9 mmol/L (ref 3.5–5.1)
Potassium: 4.4 mmol/L (ref 3.5–5.1)
SODIUM: 143 mmol/L (ref 135–145)
Sodium: 141 mmol/L (ref 135–145)
Sodium: 140 mmol/L (ref 135–145)

## 2018-05-19 LAB — CBC
HCT: 35.9 % — ABNORMAL LOW (ref 36.0–46.0)
Hemoglobin: 10.3 g/dL — ABNORMAL LOW (ref 12.0–15.0)
MCH: 26.5 pg (ref 26.0–34.0)
MCHC: 28.7 g/dL — ABNORMAL LOW (ref 30.0–36.0)
MCV: 92.5 fL (ref 80.0–100.0)
PLATELETS: 120 10*3/uL — AB (ref 150–400)
RBC: 3.88 MIL/uL (ref 3.87–5.11)
RDW: 17.9 % — ABNORMAL HIGH (ref 11.5–15.5)
WBC: 5.6 10*3/uL (ref 4.0–10.5)
nRBC: 0.7 % — ABNORMAL HIGH (ref 0.0–0.2)

## 2018-05-19 LAB — GLUCOSE, CAPILLARY
GLUCOSE-CAPILLARY: 193 mg/dL — AB (ref 70–99)
Glucose-Capillary: 167 mg/dL — ABNORMAL HIGH (ref 70–99)
Glucose-Capillary: 186 mg/dL — ABNORMAL HIGH (ref 70–99)
Glucose-Capillary: 152 mg/dL — ABNORMAL HIGH (ref 70–99)
Glucose-Capillary: 120 mg/dL — ABNORMAL HIGH (ref 70–99)
Glucose-Capillary: 135 mg/dL — ABNORMAL HIGH (ref 70–99)
Glucose-Capillary: 50 mg/dL — ABNORMAL LOW (ref 70–99)

## 2018-05-19 LAB — POTASSIUM
Potassium: 5.4 mmol/L — ABNORMAL HIGH (ref 3.5–5.1)
Potassium: 5.5 mmol/L — ABNORMAL HIGH (ref 3.5–5.1)

## 2018-05-19 LAB — MAGNESIUM
Magnesium: 2.8 mg/dL — ABNORMAL HIGH (ref 1.7–2.4)
Magnesium: 2.2 mg/dL (ref 1.7–2.4)
Magnesium: 2.1 mg/dL (ref 1.7–2.4)
Magnesium: 1.8 mg/dL (ref 1.7–2.4)

## 2018-05-19 LAB — PHOSPHORUS: Phosphorus: 8.9 mg/dL — ABNORMAL HIGH (ref 2.5–4.6)

## 2018-05-19 MED ORDER — PUREFLOW DIALYSIS SOLUTION
INTRAVENOUS | Status: DC
  Administered 2018-05-19 – 2018-05-20 (×3): via INTRAVENOUS_CENTRAL

## 2018-05-19 MED ORDER — SODIUM CHLORIDE 0.9 % IV SOLN
2.0000 g | Freq: Two times a day (BID) | INTRAVENOUS | Status: DC
  Administered 2018-05-19 – 2018-05-20 (×2): 2 g via INTRAVENOUS
  Filled 2018-05-19 (×4): qty 2

## 2018-05-19 MED ORDER — BISACODYL 10 MG RE SUPP
10.0000 mg | Freq: Once | RECTAL | Status: AC
  Administered 2018-05-19: 10 mg via RECTAL
  Filled 2018-05-19: qty 1

## 2018-05-19 MED ORDER — VASOPRESSIN 20 UNIT/ML IV SOLN
0.0300 [IU]/min | INTRAVENOUS | Status: DC
  Administered 2018-05-19 – 2018-05-20 (×2): 0.03 [IU]/min via INTRAVENOUS
  Filled 2018-05-19 (×2): qty 2

## 2018-05-19 MED ORDER — NOREPINEPHRINE-SODIUM CHLORIDE 4-0.9 MG/250ML-% IV SOLN
INTRAVENOUS | Status: AC
  Filled 2018-05-19: qty 250

## 2018-05-19 MED ORDER — CALCIUM GLUCONATE-NACL 1-0.675 GM/50ML-% IV SOLN
1.0000 g | Freq: Once | INTRAVENOUS | Status: AC
  Administered 2018-05-19: 1000 mg via INTRAVENOUS
  Filled 2018-05-19: qty 50

## 2018-05-19 MED ORDER — MIDAZOLAM HCL 2 MG/2ML IJ SOLN
2.0000 mg | INTRAMUSCULAR | Status: DC | PRN
  Administered 2018-05-20: 4 mg via INTRAVENOUS
  Filled 2018-05-19: qty 4

## 2018-05-19 MED ORDER — DEXTROSE 50 % IV SOLN
1.0000 | Freq: Once | INTRAVENOUS | Status: AC
  Administered 2018-05-19: 50 mL via INTRAVENOUS
  Filled 2018-05-19: qty 50

## 2018-05-19 MED ORDER — INSULIN REGULAR HUMAN 100 UNIT/ML IJ SOLN
10.0000 [IU] | Freq: Once | INTRAMUSCULAR | Status: AC
  Administered 2018-05-19: 10 [IU] via INTRAVENOUS
  Filled 2018-05-19: qty 10

## 2018-05-19 MED ORDER — ACETAMINOPHEN 325 MG PO TABS
650.0000 mg | ORAL_TABLET | ORAL | Status: DC | PRN
  Administered 2018-05-19: 650 mg via ORAL
  Filled 2018-05-19: qty 2

## 2018-05-19 MED ORDER — HEPARIN SODIUM (PORCINE) 1000 UNIT/ML DIALYSIS
1000.0000 [IU] | INTRAMUSCULAR | Status: DC | PRN
  Filled 2018-05-19: qty 6

## 2018-05-19 NOTE — Progress Notes (Signed)
Notified Dr. Belia Heman that patient's oxygen saturation is not reading on the monitor.  He ordered to get a ABG.

## 2018-05-19 NOTE — Consult Note (Signed)
MEDICATION RELATED CONSULT NOTE - INITIAL   Pharmacy Consult for Review Meds DAILY for adjustment due to CRRT  Medications:  Scheduled:  . aspirin  81 mg Per Tube Daily  . bisacodyl  10 mg Rectal Once  . chlorhexidine gluconate (MEDLINE KIT)  15 mL Mouth Rinse BID  . feeding supplement (PRO-STAT SUGAR FREE 64)  30 mL Per Tube BID  . feeding supplement (VITAL HIGH PROTEIN)  1,000 mL Per Tube Q24H  . heparin injection (subcutaneous)  5,000 Units Subcutaneous Q8H  . insulin aspart  0-15 Units Subcutaneous Q4H  . insulin aspart  2 Units Subcutaneous Q4H  . insulin glargine  25 Units Subcutaneous QHS  . ipratropium-albuterol  3 mL Nebulization Q4H  . mouth rinse  15 mL Mouth Rinse 10 times per day  . methylPREDNISolone (SOLU-MEDROL) injection  40 mg Intravenous Q12H  . multivitamin  15 mL Per Tube Daily  . norepinephrine      . pantoprazole sodium  40 mg Per Tube Daily  . senna-docusate  1 tablet Per Tube BID  . sodium chloride flush  10-40 mL Intracatheter Q12H  . venlafaxine  75 mg Per Tube TID   Infusions:  . sodium chloride Stopped (05/18/18 1429)  . azithromycin Stopped (05/18/18 1529)  . cefTRIAXone (ROCEPHIN)  IV Stopped (05/18/18 1342)  . fentaNYL infusion INTRAVENOUS 250 mcg/hr (05/19/18 0700)  . phenylephrine (NEO-SYNEPHRINE) Adult infusion 60 mcg/min (05/19/18 1230)  . pureflow    . vasopressin (PITRESSIN) infusion - *FOR SHOCK*     PRN: acetaminophen, albuterol, heparin, LORazepam, midazolam, midazolam, sodium chloride flush, vecuronium  Assessment: Pharmacy consulted for daily review of medications for adjustment due to CRRT initiation. CRRT started 1/15.   Plan:  1/15 All medications have been reviewed. No dose adjustments needed at this time.   Pernell Dupre, PharmD, BCPS Clinical Pharmacist 05/19/2018 12:51 PM

## 2018-05-19 NOTE — Progress Notes (Signed)
SUBJECTIVE: Remains intubated and sedated.   Vitals:   05/19/18 0600 05/19/18 0700 05/19/18 0759 05/19/18 0800  BP: 96/84 98/64  (!) 101/59  Pulse: 94 89  78  Resp: 17 14  15   Temp: 99.6 F (37.6 C)   98.5 F (36.9 C)  TempSrc: Axillary   Axillary  SpO2: 95% 95% 94% 94%  Weight:      Height:        Intake/Output Summary (Last 24 hours) at 05/19/2018 0833 Last data filed at 05/19/2018 0700 Gross per 24 hour  Intake 4163.78 ml  Output 400 ml  Net 3763.78 ml    LABS: Basic Metabolic Panel: Recent Labs    05/18/18 0520 05/18/18 2103  05/19/18 0400 05/19/18 0801  NA 141 140  --  140  --   K 5.0 6.2*   < > 5.7* 5.5*  CL 107 106  --  109  --   CO2 23 25  --  24  --   GLUCOSE 216* 292*  --  203*  --   BUN 64* 87*  --  99*  --   CREATININE 2.72* 3.95*  --  4.37*  --   CALCIUM 8.1* 7.7*  --  7.6*  --   MG 2.4  --   --  2.8*  --   PHOS 7.2*  --   --  8.9*  --    < > = values in this interval not displayed.   Liver Function Tests: Recent Labs    06-06-18 1111  AST 38  ALT 42  ALKPHOS 83  BILITOT 0.8  PROT 7.5  ALBUMIN 3.8   No results for input(s): LIPASE, AMYLASE in the last 72 hours. CBC: Recent Labs    06/06/2018 1111 05/17/18 0424 05/19/18 0400  WBC 9.7 5.6 5.6  NEUTROABS 7.9*  --   --   HGB 11.8* 10.7* 10.3*  HCT 40.1 35.5* 35.9*  MCV 89.3 88.1 92.5  PLT 162 107* 120*   Cardiac Enzymes: Recent Labs    Jun 06, 2018 1633 June 06, 2018 2154 05/17/18 0424  TROPONINI 0.14* 0.15* 0.17*   BNP: Invalid input(s): POCBNP D-Dimer: No results for input(s): DDIMER in the last 72 hours. Hemoglobin A1C: No results for input(s): HGBA1C in the last 72 hours. Fasting Lipid Panel: Recent Labs    05/17/18 2014  TRIG 95   Thyroid Function Tests: No results for input(s): TSH, T4TOTAL, T3FREE, THYROIDAB in the last 72 hours.  Invalid input(s): FREET3 Anemia Panel: No results for input(s): VITAMINB12, FOLATE, FERRITIN, TIBC, IRON, RETICCTPCT in the last 72  hours.   PHYSICAL EXAM General: Critically ill, intubated and sedated. HEENT:  Normocephalic and atramatic Neck:  No JVD.  Lungs: Clear bilaterally to auscultation and percussion. Heart: HRRR . Normal S1 and S2 without gallops or murmurs.  Abdomen: Bowel sounds are positive, abdomen soft and non-tender  Extremities: No clubbing, cyanosis or edema.   Neuro:  intubated and sedated. Psych:   intubated and sedated.   TELEMETRY: NSR 93bpm  ASSESSMENT AND PLAN: Acute respiratory failure secondary to COPD and CHF exacerbation: Last echo 05/13/18 showed 4 chamber dilatation and EF 25-30%. Blood pressure remains low, cannot restart Entresto/coreg/spironolactone/lasix. Worsening renal failure noted. Continue respiratory support and will consider restarting HF medications when renal and respiratory status are improved.    Active Problems:   Acute respiratory failure (HCC)   Lobar pneumonia, unspecified organism [facility])   Renal failure    Caroleen Hamman, NP-C 05/19/2018 8:33 AM Cell: [phone]

## 2018-05-19 NOTE — Consult Note (Addendum)
Pharmacy Antibiotic Note  Alexis Ray is a 63 y.o. female admitted on 05/19/2018 with  pneumonia. Patient was started on Ceftriaxone and Azithromycin. Bronchial lavage cultures showing Pseudomonas and H. Influenzae.  Pharmacy has been consulted for Cefepime dosing.  Patient started on CRRT on 1/15   Plan: Start Cefepime 2g IV every 12 hours (CRRT Dosing)   Height: 5\' 8"  (172.7 cm) Weight: 198 lb 10.2 oz (90.1 kg) IBW/kg (Calculated) : 63.9  Temp (24hrs), Avg:99.9 F (37.7 C), Min:98.3 F (36.8 C), Max:101.3 F (38.5 C)  Recent Labs  Lab 05/25/2018 1111 05/30/2018 1127 05/24/2018 1425 05/28/2018 2003 06/02/2018 2154 05/17/18 0424 05/18/18 0520 05/18/18 2103 05/19/18 0400  WBC 9.7  --   --   --   --  5.6  --   --  5.6  CREATININE 1.78*  --   --   --   --  1.51* 2.72* 3.95* 4.37*  LATICACIDVEN  --  4.52* 2.3* 1.3 1.2  --   --   --   --     Estimated Creatinine Clearance: 15.9 mL/min (A) (by C-G formula based on SCr of 4.37 mg/dL (H)).    Allergies  Allergen Reactions  . Colchicine     itching  . Sulfa Antibiotics Rash    itching  . Levaquin [Levofloxacin In D5w] Rash    Antimicrobials this admission: 1/14 ceftriaxone  >> 1/14 1/14 Azithromycin >> 1/14 cefepime >>   Microbiology results: 1/12 BCx: NGTD 1/13 Respirtory Cx: PSEUDOMONAS AERUGINOSA and HAEMOPHILUS INFLUENZAE 1/12  MRSA PCR: negative   Thank you for allowing pharmacy to be a part of this patient's care.  Gardner Candle, PharmD, BCPS Clinical Pharmacist 05/19/2018 1:50 PM

## 2018-05-19 NOTE — Progress Notes (Signed)
Inpatient Diabetes Program Recommendations  AACE/ADA: New Consensus Statement on Inpatient Glycemic Control  Target Ranges:  Prepandial:   less than 140 mg/dL      Peak postprandial:   less than 180 mg/dL (1-2 hours)      Critically ill patients:  140 - 180 mg/dL   Results for Alexis Ray, Alexis Ray (MRN [id number]) as of 05/19/2018 09:49  Ref. Range 05/18/2018 07:31 05/18/2018 11:54 05/18/2018 13:15 05/18/2018 16:54 05/18/2018 19:43 05/18/2018 21:54 05/18/2018 23:28 05/19/2018 00:58 05/19/2018 03:52 05/19/2018 07:14  Glucose-Capillary Latest Ref Range: 70 - 99 mg/dL 371 (H) 062 (H) 694 (H) 300 (H) 287 (H) 280 (H) 227 (H) 193 (H) 167 (H) 186 (H)   Review of Glycemic Control  history:DM2 Outpatient Diabetes medications:Lantus 45 units QHS, Novolog 10 units TID with meals, Metformin 500 mg BID, Trulicity 1.5 mg Qweek (Monday) Current orders for Inpatient glycemic control:Lantus 25 units QHS, Novolog 0-15 units Q4H, Novolog 2 units Q4H; Solumedrol 40 mg Q12H   Inpatient Diabetes Program Recommendations: Insulin-Basal: If steroids are continued, please consider increasing Lantus to 30 units QHS. Insulin-Tube Feeding Coverage: Please consider increasing tube feeding coverage to Novolog 4 units Q4H.   Thanks, Orlando Penner, RN, MSN, CDE Diabetes Coordinator Inpatient Diabetes Program [phone] (Team Pager from 8am to 5pm)

## 2018-05-19 NOTE — Progress Notes (Signed)
[facility]  ROUNDING NOTE   Subjective:  Patient well-known to us. We recently saw her in the office for management of chronic kidney disease stage III. He is critically ill now with multiorgan failure. She has history of severe chronic systolic heart failure with ejection fraction of 15%. She failed BiPAP and is now on the ventilator. In addition she has suffered acute renal failure and her creatinine is up to 4.4 with urine output of only 620 cc over the preceding 24 hours. Patient also hypotensive.   Objective:  Vital signs in last 24 hours:  Temp:  [98.5 F (36.9 C)-101.3 F (38.5 C)] 98.5 F (36.9 C) (01/15 0800) Pulse Rate:  [78-97] 78 (01/15 0800) Resp:  [12-28] 15 (01/15 0800) BP: (92-116)/(53-84) 101/59 (01/15 0800) SpO2:  [88 %-100 %] 88 % (01/15 0850) FiO2 (%):  [60 %-100 %] 70 % (01/15 0850) Weight:  [90.1 kg] 90.1 kg (01/15 0319)  Weight change: 1.5 kg Filed Weights   05/08/2018 1401 05/18/18 0414 05/19/18 0319  Weight: 89 kg 88.6 kg 90.1 kg    Intake/Output: I/O last 3 completed shifts: In: 4163.8 [I.V.:2529.8; NG/GT:1267.3; IV Piggyback:366.7] Out: 975 [Urine:975]   Intake/Output this shift:  No intake/output data recorded.  Physical Exam: General: Critically ill appearing  Head: ETT in place  Eyes: Anicteric  Neck: Supple, trachea midline  Lungs:  Scattered rhonchi, vent assisted  Heart: S1S2 no rubs  Abdomen:  Soft, nontender, bowel sounds present  Extremities: 1+ peripheral edema.  Neurologic: Intubated, not following commands  Skin: No lesions       Basic Metabolic Panel: Recent Labs  Lab 05/10/2018 1111 05/17/18 0424 05/18/18 0520 05/18/18 2103 05/19/18 0005 05/19/18 0400 05/19/18 0801  NA 137 141 141 140  --  140  --   K 5.1 4.4 5.0 6.2* 5.4* 5.7* 5.5*  CL 102 108 107 106  --  109  --   CO2 23 26 23 25  --  24  --   GLUCOSE 288* 172* 216* 292*  --  203*  --   BUN 27* 33* 64* 87*  --  99*  --   CREATININE 1.78* 1.51*  2.72* 3.95*  --  4.37*  --   CALCIUM 8.5* 8.0* 8.1* 7.7*  --  7.6*  --   MG  --   --  2.4  --   --  2.8*  --   PHOS  --   --  7.2*  --   --  8.9*  --     Liver Function Tests: Recent Labs  Lab 05/17/2018 1111  AST 38  ALT 42  ALKPHOS 83  BILITOT 0.8  PROT 7.5  ALBUMIN 3.8   No results for input(s): LIPASE, AMYLASE in the last 168 hours. No results for input(s): AMMONIA in the last 168 hours.  CBC: Recent Labs  Lab 05/26/2018 1111 05/17/18 0424 05/19/18 0400  WBC 9.7 5.6 5.6  NEUTROABS 7.9*  --   --   HGB 11.8* 10.7* 10.3*  HCT 40.1 35.5* 35.9*  MCV 89.3 88.1 92.5  PLT 162 107* 120*    Cardiac Enzymes: Recent Labs  Lab 05/30/2018 1111 06/03/2018 1633 05/06/2018 2154 05/17/18 0424  TROPONINI 0.14* 0.14* 0.15* 0.17*    BNP: Invalid input(s): POCBNP  CBG: Recent Labs  Lab 05/18/18 2154 05/18/18 2328 05/19/18 0058 05/19/18 0352 05/19/18 0714  GLUCAP 280* 227* 193* 167* 186*    Microbiology: Results for orders placed or performed during the hospital encounter of   05/15/2018  MRSA PCR Screening     Status: None   Collection Time: 05/19/2018  2:02 PM  Result Value Ref Range Status   MRSA by PCR NEGATIVE NEGATIVE Final    Comment:        The GeneXpert MRSA Assay (FDA approved for NASAL specimens only), is one component of a comprehensive MRSA colonization surveillance program. It is not intended to diagnose MRSA infection nor to guide or monitor treatment for MRSA infections. Performed at [facility], [address] [place], [place] [address]   Culture, blood (routine x 2)     Status: None (Preliminary result)   Collection Time: 05/27/2018  2:25 PM  Result Value Ref Range Status   Specimen Description BLOOD LHAND  Final   Special Requests   Final    BOTTLES DRAWN AEROBIC AND ANAEROBIC Blood Culture adequate volume   Culture   Final    NO GROWTH 3 DAYS Performed at [facility], [address] [place], [place] [address]    Report  Status PENDING  Incomplete  Culture, blood (routine x 2)     Status: None (Preliminary result)   Collection Time: 05/25/2018  2:27 PM  Result Value Ref Range Status   Specimen Description BLOOD BRH  Final   Special Requests   Final    BOTTLES DRAWN AEROBIC AND ANAEROBIC Blood Culture adequate volume   Culture   Final    NO GROWTH 3 DAYS Performed at [facility], [address] [place], [place] [address]    Report Status PENDING  Incomplete  Culture, bal-quantitative     Status: None (Preliminary result)   Collection Time: 05/17/18  2:00 PM  Result Value Ref Range Status   Specimen Description   Final    BRONCHIAL ALVEOLAR LAVAGE Performed at [facility], [address] [place], [place] [address]    Special Requests   Final    NONE Performed at [facility], [address] [place], [place] [address]    Gram Stain   Final    MODERATE WBC PRESENT, PREDOMINANTLY PMN RARE GRAM POSITIVE COCCI RARE GRAM POSITIVE RODS    Culture   Final    CULTURE REINCUBATED FOR BETTER GROWTH Performed at [facility], [address] [place], [place] [address]    Report Status PENDING  Incomplete    Coagulation Studies: No results for input(s): LABPROT, INR in the last 72 hours.  Urinalysis: No results for input(s): COLORURINE, LABSPEC, PHURINE, GLUCOSEU, HGBUR, BILIRUBINUR, KETONESUR, PROTEINUR, UROBILINOGEN, NITRITE, LEUKOCYTESUR in the last 72 hours.  Invalid input(s): APPERANCEUR    Imaging: Dg Abd 1 View  Result Date: 05/19/2018 CLINICAL DATA:  Abdominal distension EXAM: ABDOMEN - 1 VIEW COMPARISON:  None. FINDINGS: Nasogastric tube tip and side port project over the stomach. There are bilateral iliac stents. Large amount of stool in the proximal colon. IMPRESSION: Large amount of stool in the proximal colon. Electronically Signed   By: Kevin  Herman M.D.   On: 05/19/2018 01:55   Dg Chest Port 1 View  Result Date: 05/18/2018 CLINICAL DATA:   Acute respiratory failure EXAM: PORTABLE CHEST 1 VIEW COMPARISON:  Yesterday FINDINGS: Endotracheal tube tip is below the clavicular heads. The carina is difficult to visualize but estimate that the tip terminates 18 mm above the carina. The orogastric tube at least reaches the stomach. Cardiomegaly and vascular pedicle widening. Low volume chest with diffuse interstitial opacity and mild streaky density at the bases. IMPRESSION: 1.   Stable hardware positioning. The endotracheal tube tip is 18 mm above the carina. 2. Cardiomegaly and vascular congestion. Streaky density at the bases that could be atelectasis or infection. Electronically Signed   By: Monte Fantasia M.D.   On: 05/18/2018 07:05   [place] Ekg Site Rite  Result Date: 05/18/2018 If Site Rite image not attached, placement could not be confirmed due to current cardiac rhythm.    Medications:   . sodium chloride Stopped (05/18/18 1429)  . azithromycin Stopped (05/18/18 1529)  . cefTRIAXone (ROCEPHIN)  IV Stopped (05/18/18 1342)  . fentaNYL infusion INTRAVENOUS 250 mcg/hr (05/19/18 0700)  . phenylephrine (NEO-SYNEPHRINE) Adult infusion 60 mcg/min (05/19/18 0700)  . propofol (DIPRIVAN) infusion 30 mcg/kg/min (05/19/18 0700)   . aspirin  81 mg Per Tube Daily  . chlorhexidine gluconate (MEDLINE KIT)  15 mL Mouth Rinse BID  . feeding supplement (PRO-STAT SUGAR FREE 64)  30 mL Per Tube BID  . feeding supplement (VITAL HIGH PROTEIN)  1,000 mL Per Tube Q24H  . heparin injection (subcutaneous)  5,000 Units Subcutaneous Q8H  . insulin aspart  0-15 Units Subcutaneous Q4H  . insulin aspart  2 Units Subcutaneous Q4H  . insulin glargine  25 Units Subcutaneous QHS  . ipratropium-albuterol  3 mL Nebulization Q4H  . mouth rinse  15 mL Mouth Rinse 10 times per day  . methylPREDNISolone (SOLU-MEDROL) injection  40 mg Intravenous Q12H  . multivitamin  15 mL Per Tube Daily  . pantoprazole sodium  40 mg Per Tube Daily  . rosuvastatin  40 mg Per Tube q1800   . senna-docusate  1 tablet Per Tube BID  . sodium chloride flush  10-40 mL Intracatheter Q12H  . venlafaxine  75 mg Per Tube TID   acetaminophen, albuterol, LORazepam, midazolam, midazolam, sodium chloride flush, vecuronium  Assessment/ Plan:  63 y.o. female PMHx of coronary artery disease, COPD, diabetes mellitus type 2, hypertension, chronic kidney disease stage III baseline creatinine 1.3 now admitted with multiorgan failure including acute on chronic systolic heart failure, acute respiratory failure, acute renal failure.  1.  Acute renal failure/chronic kidney disease stage III.  Patient critically ill at the moment.  She has evidence of multiorgan failure including acute on chronic heart failure, acute respiratory failure, and now acute renal failure.  We recommend initiation of renal replacement therapy in the form of CRRT.  The patient's family is agreeable to this course of action.  We will consult with critical care to place temporary dialysis catheter.  In addition we will prepare orders for CRRT without ultrafiltration at the moment.  2.  Acute respiratory failure.  Patient currently maintained on the ventilator.  3.  Acute on chronic systolic heart failure.  Patient hypotensive at this time.  Maintained on pressors.  4.  Overall prognosis quite guarded.  Thanks for consultation.   LOS: 3 Alexis Ray 1/15/20209:31 AM

## 2018-05-19 NOTE — Progress Notes (Signed)
RT in to assess patients oxygenation. Her sats were in the mid 80's on 100% and 8 PEEP. Recruitment maneuver performed with saturations rising into the mid 90's but they progressively started to drop. Patient changed to PCV of 18 and 12 PEEP with adequate tidal volumes being obtained and sats hovering in the low to mid 90's at this time. Will continue to monitor.

## 2018-05-19 NOTE — Progress Notes (Signed)
Pharmacy Monitoring Consult:  Pharmacy consulted to assist in monitoring and replacing electrolytes in this 63 y.o. female admitted on 05/18/2018 with Shortness of Breath  Patient currently requiring mechanical ventilation and is sedated on continuous propofol and fentanyl.   Labs:  Sodium (mmol/L)  Date Value  05/19/2018 140  12/11/2013 136   Potassium (mmol/L)  Date Value  05/19/2018 5.5 (H)  04/10/2014 3.8   Magnesium (mg/dL)  Date Value  01/74/9449 2.8 (H)  12/04/2011 1.8   Phosphorus (mg/dL)  Date Value  67/59/1638 8.9 (H)   Calcium (mg/dL)  Date Value  46/65/9935 7.6 (L)   Calcium, Total (mg/dL)  Date Value  70/17/7939 8.5   Albumin (g/dL)  Date Value  03/00/9233 3.8  12/11/2013 3.2 (L)    Assessment/Plan: 1. Electrolytes: CRRT started today 1/15. No electrolyte replacement needed at this time.  Will obtain all electrolytes with am labs.   2. Glucose: Patient on tube feeds. Continue SSI Q4hr. Continue Lantus 25 units. Patient starting on CRRT today. After discussion with Dr. Belia Heman- no changes will be made to insulin regimen at this time.    3. Constipation: Last BM 1/12. Continue senna/docusate 1 tab BID. Order  Bisacodyl 10mg  supp x1.  If no bowel movement overnight, will advance regimen.   Pharmacy will continue to monitor and adjust per consult.   Gardner Candle, PharmD, BCPS Clinical Pharmacist 05/19/2018 12:29 PM

## 2018-05-19 NOTE — Consult Note (Signed)
Consultation Note Date: 05/19/2018   Patient Name: Alexis Ray  DOB: 1956-04-21  MRN: [id number]  Age / Sex: 63 y.o., female  PCP: Jodean Lima, MD Referring Physician: Otila Back, MD  Reason for Consultation: Establishing goals of care  HPI/Patient Profile: 63 y.o. female  with past medical history of CHF (EF 20-25% echo 05/02/18), CAD, COPD (4L oxygen at home), HTN, diabetes, CKD stage 3, PVD, h/o CVA admitted on 05/15/2018 with SOB r/t COPD, CHF exacerbation. Hospitalization complicated by pneumonia, vent dependence requiring FiO2 100%, bronch 05/17/17, and now with worsening renal failure. She has multiorgan failure with overall poor prognosis.   Clinical Assessment and Goals of Care: I met today with Alexis Ray's family: 2 daughters, mother, 2 sisters, 1 brother. I explained poor prognosis with multiorgan failure and poor response to extremely aggressive interventions - she continues to decline. I explained that we can only provide the tools for Alexis Ray but her body and her organs have to be able to utilize these tools in order to improve. I explained that I fear she may not tolerate CRRT with low BP and vasopressors as well as EF 20-25%. I explained that we are running out of interventions to add to Alexis Ray's care.   Family is extremely tearful - I believe they understand poor prognosis. Daughters are clear that they want everything done for their mother as they feel that this is what she would want. They are fearful that we are giving up on her but I was clear that the medical team is doing everything possible but at some point her body has to respond and unfortunately she is getting worse instead of better. I explained that we will continue everything and they can be hopeful but that it would not be responsible if we did not begin to address if she continues to decline what we should do. I strongly  urged them to consider no CPR/DNR as if her heart stops then I do not feel she could survive resuscitation and it is not the answer to fix her current health issues. Daughter, Alexis Ray, says that her mother has a Living Will and she wishes to defer to her mother's wishes before making a final decision. I urged her to find Living Will and and for family to consider this limitation.   I updated family after discussion with Dr. Mortimer Fries that there are no plans for another bronchoscopy today as she is too unstable. Also spoke with RN about allowing 40 yo grandson to visit regardless of flu restrictions given poor prognosis - educated daughter to provide mask and hand washing to her son if he visits and to make sure he is healthy with no fever.   I will continue to follow and discuss.   Primary Decision Maker NEXT OF KIN 2 daughters, mother    SUMMARY OF RECOMMENDATIONS   - Family considering no CPR/DNR - Will continue to discuss and update  Code Status/Advance Care Planning:  Full code   Symptom Management:   Per  PCCM and renal.   Palliative Prophylaxis:   Delirium Protocol, Frequent Pain Assessment, Oral Care and Turn Reposition  Additional Recommendations (Limitations, Scope, Preferences):  Full Scope Treatment  Psycho-social/Spiritual:   Desire for further Chaplaincy support:yes  Additional Recommendations: Grief/Bereavement Support  Prognosis:   Hours - Days  Discharge Planning: Anticipated Hospital Death      Primary Diagnoses: Present on Admission: . Acute respiratory failure ([place])   I have reviewed the medical record, interviewed the patient and family, and examined the patient. The following aspects are pertinent.  Past Medical History:  Diagnosis Date  . CAD (coronary artery disease)   . Chronic combined systolic and diastolic CHF (congestive heart failure) ([place])   . COPD (chronic obstructive pulmonary disease) ([place])   . Diabetes mellitus without complication  ([place])   . GERD (gastroesophageal reflux disease)   . Hypertension   . PVD (peripheral vascular disease) ([place])   . Stroke (cerebrum) [facility])    Social History   Socioeconomic History  . Marital status: Single    Spouse name: Not on file  . Number of children: Not on file  . Years of education: Not on file  . Highest education level: Not on file  Occupational History  . Not on file  Social Needs  . Financial resource strain: Not on file  . Food insecurity:    Worry: Not on file    Inability: Not on file  . Transportation needs:    Medical: Not on file    Non-medical: Not on file  Tobacco Use  . Smoking status: Current Every Day Smoker    Packs/day: 1.00    Types: Cigarettes  . Smokeless tobacco: Never Used  Substance and Sexual Activity  . Alcohol use: No    Alcohol/week: 0.0 standard drinks  . Drug use: No  . Sexual activity: Never  Lifestyle  . Physical activity:    Days per week: Not on file    Minutes per session: Not on file  . Stress: Not on file  Relationships  . Social connections:    Talks on phone: Not on file    Gets together: Not on file    Attends religious service: Not on file    Active member of club or organization: Not on file    Attends meetings of clubs or organizations: Not on file    Relationship status: Not on file  Other Topics Concern  . Not on file  Social History Narrative  . Not on file   Family History  Problem Relation Age of Onset  . Diabetes Mother   . Cancer Father   . Diabetes Maternal Aunt   . Diabetes Maternal Aunt    Scheduled Meds: . aspirin  81 mg Per Tube Daily  . chlorhexidine gluconate (MEDLINE KIT)  15 mL Mouth Rinse BID  . feeding supplement (PRO-STAT SUGAR FREE 64)  30 mL Per Tube BID  . feeding supplement (VITAL HIGH PROTEIN)  1,000 mL Per Tube Q24H  . heparin injection (subcutaneous)  5,000 Units Subcutaneous Q8H  . insulin aspart  0-15 Units Subcutaneous Q4H  . insulin aspart  2 Units Subcutaneous Q4H  .  insulin glargine  25 Units Subcutaneous QHS  . ipratropium-albuterol  3 mL Nebulization Q4H  . mouth rinse  15 mL Mouth Rinse 10 times per day  . methylPREDNISolone (SOLU-MEDROL) injection  40 mg Intravenous Q12H  . multivitamin  15 mL Per Tube Daily  . pantoprazole sodium  40 mg Per Tube Daily  .  rosuvastatin  40 mg Per Tube q1800  . senna-docusate  1 tablet Per Tube BID  . sodium chloride flush  10-40 mL Intracatheter Q12H  . venlafaxine  75 mg Per Tube TID   Continuous Infusions: . sodium chloride Stopped (05/18/18 1429)  . azithromycin Stopped (05/18/18 1529)  . cefTRIAXone (ROCEPHIN)  IV Stopped (05/18/18 1342)  . fentaNYL infusion INTRAVENOUS 250 mcg/hr (05/19/18 0700)  . phenylephrine (NEO-SYNEPHRINE) Adult infusion 60 mcg/min (05/19/18 0700)  . propofol (DIPRIVAN) infusion 30 mcg/kg/min (05/19/18 0700)   PRN Meds:.acetaminophen, albuterol, LORazepam, midazolam, midazolam, sodium chloride flush, vecuronium Allergies  Allergen Reactions  . Colchicine     itching  . Sulfa Antibiotics Rash    itching  . Levaquin [Levofloxacin In D5w] Rash   Review of Systems  Unable to perform ROS: Acuity of condition    Physical Exam Vitals signs and nursing note reviewed.  Constitutional:      Appearance: She is ill-appearing and toxic-appearing.     Interventions: She is sedated and intubated.  Cardiovascular:     Rate and Rhythm: Normal rate.  Pulmonary:     Effort: No tachypnea or accessory muscle usage. She is intubated.     Breath sounds: Decreased breath sounds present.  Abdominal:     General: There is distension.     Palpations: Abdomen is soft.  Neurological:     Comments: Sedated on vent     Vital Signs: BP (!) 101/59   Pulse 78   Temp 98.5 F (36.9 C) (Axillary)   Resp 15   Ht _0  (1.727 m)   Wt 90.1 kg   SpO2 (!) 88%   BMI 30.20 kg/m  Pain Scale: CPOT   Pain Score: 0-No pain   SpO2: SpO2: (!) 88 % O2 Device:SpO2: (!) 88 % O2 Flow Rate: .   IO:  Intake/output summary:   Intake/Output Summary (Last 24 hours) at 05/19/2018 8366 Last data filed at 05/19/2018 0700 Gross per 24 hour  Intake 4163.78 ml  Output 400 ml  Net 3763.78 ml    LBM: Last BM Date: 05/26/2018 Baseline Weight: Weight: 86.2 kg Most recent weight: Weight: 90.1 kg     Palliative Assessment/Data:   Flowsheet Rows     Most Recent Value  Intake Tab  Referral Department  Hospitalist  Unit at Time of Referral  ER  Palliative Care Primary Diagnosis  Pulmonary  Date Notified  05/13/2018  Palliative Care Type  New Palliative care  Reason for referral  Clarify Goals of Care  Date of Admission  05/23/2018  # of days IP prior to Palliative referral  0  Clinical Assessment  Psychosocial & Spiritual Assessment  Palliative Care Outcomes      Time In: 0915 Time Out: 1035 Time Total: 80 min Greater than 50%  of this time was spent counseling and coordinating care related to the above assessment and plan.  Signed by: Vinie Sill, NP Palliative Medicine Team Pager # [phone] (M-F 8a-5p) Team Phone # [phone] (Nights/Weekends)

## 2018-05-19 NOTE — Progress Notes (Signed)
Notified Dana, NP of blood coming out of patient's OG tube- she ordered to hold the PO meds at this time.

## 2018-05-19 NOTE — Progress Notes (Signed)
Dr. Belia Heman made aware that patient is now on 100% FiO2- sating in the mid to upper 80's.  Dr. Belia Heman ordered to increase PEEP to 12 and that patient will need CRRT- Patient's family to meet with pallative this am at 09:30.

## 2018-05-19 NOTE — Progress Notes (Signed)
[facility] [place] [facility]   PATIENT NAME: Alexis Ray    MR#:  [id number]  DATE OF BIRTH:  12-01-1955  SUBJECTIVE:  CHIEF COMPLAINT:   Chief Complaint  Patient presents with  . Shortness of Breath   No new complaint this morning.  Patient remains intubated and sedated.  Critical care physician managing patient  REVIEW OF SYSTEMS:  ROS  Unobtainable due to patient being sedated on vent  DRUG ALLERGIES:   Allergies  Allergen Reactions  . Colchicine     itching  . Sulfa Antibiotics Rash    itching  . Levaquin [Levofloxacin In D5w] Rash   VITALS:  Blood pressure 109/61, pulse 71, temperature 98.3 F (36.8 C), temperature source Axillary, resp. rate 13, height 5\' 8"  (1.727 m), weight 90.1 kg, SpO2 99 %. PHYSICAL EXAMINATION:  GENERAL:  63 y.o.-year-old patient lying in the bed, intubated, critically ill appearing EYES: Pupils equal, round, reactive to light and accommodation. No scleral icterus. Extraocular muscles intact.  HEENT: Head atraumatic, normocephalic. Oropharynx and nasopharynx clear.  NECK:  Supple, no jugular venous distention. No thyroid enlargement, no tenderness.  LUNGS: Scant breath sounds bilaterally, bibasilar scattered wheezes heard.  No rails or rhonchi.  Using accessory muscles to breathe in spite of being on the ventilator CARDIOVASCULAR: S1, S2 normal. No murmurs, rubs, or gallops.  ABDOMEN: Soft, obese, nontender, nondistended. Bowel sounds present. No organomegaly or mass.  EXTREMITIES: No pedal edema, cyanosis, or clubbing.  NEUROLOGIC: is sedated.,  Unable to do a complete neuro exam PSYCHIATRIC: The patient is sedated.  SKIN: No obvious rash,   LABORATORY PANEL:  Female CBC Recent Labs  Lab 05/19/18 0400  WBC 5.6  HGB 10.3*  HCT 35.9*  PLT 120*   ------------------------------------------------------------------------------------------------------------------ Chemistries  Recent Labs  Lab 05/21/2018 1111   05/19/18 0400 05/19/18 0801  NA 137   < > 140  --   K 5.1   < > 5.7* 5.5*  CL 102   < > 109  --   CO2 23   < > 24  --   GLUCOSE 288*   < > 203*  --   BUN 27*   < > 99*  --   CREATININE 1.78*   < > 4.37*  --   CALCIUM 8.5*   < > 7.6*  --   MG  --    < > 2.8*  --   AST 38  --   --   --   ALT 42  --   --   --   ALKPHOS 83  --   --   --   BILITOT 0.8  --   --   --    < > = values in this interval not displayed.   RADIOLOGY:  Dg Abd 1 View  Result Date: 05/19/2018 CLINICAL DATA:  Abdominal distension EXAM: ABDOMEN - 1 VIEW COMPARISON:  None. FINDINGS: Nasogastric tube tip and side port project over the stomach. There are bilateral iliac stents. Large amount of stool in the proximal colon. IMPRESSION: Large amount of stool in the proximal colon. Electronically Signed   By: Deatra Robinson M.D.   On: 05/19/2018 01:55   Dg Chest Port 1 View  Result Date: 05/19/2018 CLINICAL DATA:  Central line placement. EXAM: PORTABLE CHEST 1 VIEW COMPARISON:  05/18/2018 FINDINGS: A left internal jugular dual-lumen central venous catheter has been placed. Tip lies between the mid and lower superior vena cava. There is also a new  right PICC, tip also projecting between the mid and lower superior vena cava. No pneumothorax. Endotracheal tube and nasal/orogastric tube are stable and well positioned. Chronic lung changes are stable. There is persistent basilar opacity consistent with atelectasis. IMPRESSION: Left internal jugular central venous catheter tip projects in the mid to lower superior vena cava. Right PICC tip also projects in the mid to lower superior vena cava. No pneumothorax. No other change from the previous day's study. Electronically Signed   By: Amie Portland M.D.   On: 05/19/2018 12:44   ASSESSMENT AND PLAN:    Alexis Ray  is a 63 y.o. female with a known history of chronic respiratory failure secondary to COPD with ongoing smoking on 4 L home oxygen, chronic systolic heart failure with last  known ejection fraction less than 15%, diabetes mellitus, hypertension and GERD presents to hospital secondary to worsening shortness of breath.  1.  Acute hypoxic respiratory failure-secondary to COPD exacerbation and CHF exacerbation -Intubated, currently on ventilator.  Gradually weaning down FiO2.  Oxygen requirement had to be increased to 100% FiO2.  Being gradually weaned down.  Being managed by critical care physician.-Started on IV steroids, inhalers/nebulizers. Patient status post bronchoscopy by critical care physician on 05/17/2018 which showed extensive mucous and purulent secretions. Continue antibiotics.  2.  Acute on chronic CHF exacerbation-systolic dysfunction with EF less than 15% Patient seen by cardiologist.  Patient said to have been compliant with her medications and was wearing LifeVest on follow-up.  Plan is for once patient gets extubated and blood pressure stable, to resume medications. Per cardiology documentation recent ejection fraction on recent 2D echocardiogram done about 5 days prior to this admission in cardiologist office was 25 to 30%.   Started on IV Lasix and monitor BNP -Continue cardiac medications including aspirin, statin, Coreg, aldactone and Entresto Seen by palliative care team.  3. . Acute kidney injury superimposed on CKD stage III- Nephrology service following.  Due to worsening of renal function, plan is for initiation of renal replacement therapy in the form of CRRT.  4.  Diabetes mellitus-continue home medications.  Monitor with sliding scale insulin especially while on steroids.  5.  GERD-Protonix    DVT prophylaxis-on heparin   All the records are reviewed and case discussed with Care Management/Social Worker. Management plans discussed with the patient, family and they are in agreement.  CODE STATUS: Full Code  TOTAL TIME TAKING CARE OF THIS PATIENT: 33 minutes.   More than 50% of the time was spent in counseling/coordination of  care: YES  POSSIBLE D/C IN 3 DAYS, DEPENDING ON CLINICAL CONDITION.   Kalev Temme M.D on 05/19/2018 at 2:40 PM  Between 7am to 6pm - Pager - [phone]  After 6pm go to [email or web address] - Social research officer, government  [facility]  Office  [phone]  CC: Primary care physician; Keane Police, MD  Note: This dictation was prepared with Dragon dictation along with smaller phrase technology. Any transcriptional errors that result from this process are unintentional.

## 2018-05-19 NOTE — Progress Notes (Signed)
CRITICAL CARE NOTE  CC  Severe  resp failure  SUBJECTIVE fio2 at 60% PEEP at 8 Remains critically ill Progressive renal failure Poor prognosis      SIGNIFICANT EVENTS COPD on 4 L [place] at home EF 15%   Failed biPAP 1/12 -intubated on MV support 1/13 BRONCH-pneumonia 1/14 vent support 1/15 worsening renal failure, resp failure   BP 96/84 (BP Location: Left Arm)   Pulse 94   Temp 99.6 F (37.6 C) (Axillary)   Resp 17   Ht 5\' 8"  (1.727 m)   Wt 90.1 kg   SpO2 95%   BMI 30.20 kg/m    REVIEW OF SYSTEMS  PATIENT IS UNABLE TO PROVIDE COMPLETE REVIEW OF SYSTEM S DUE TO SEVERE CRITICAL ILLNESS AND ENCEPHALOPATHY   PHYSICAL EXAMINATION:  GENERAL:critically ill appearing, +resp distress HEAD: Normocephalic, atraumatic.  EYES: Pupils equal, round, reactive to light.  No scleral icterus.  MOUTH: Moist mucosal membrane. NECK: Supple. No thyromegaly. No nodules. No JVD.  PULMONARY: +rhonchi, +wheezing CARDIOVASCULAR: S1 and S2. Regular rate and rhythm. No murmurs, rubs, or gallops.  GASTROINTESTINAL: Soft, nontender, -distended. No masses. Positive bowel sounds. No hepatosplenomegaly.  MUSCULOSKELETAL: No swelling, clubbing, or edema.  NEUROLOGIC: obtunded SKIN:intact,warm,dry         Indwelling Urinary Catheter continued, requirement due to   Reason to continue Indwelling Urinary Catheter for strict Intake/Output monitoring for hemodynamic instability         Ventilator continued, requirement due to, resp failure    Ventilator Sedation RASS 0 to -2     ASSESSMENT AND PLAN SYNOPSIS  Severe Hypoxic and Hypercapnic Respiratory Failure Pneumonia and COPD exacerbation -continue Mechanical Ventilator support -continue Bronchodilator Therapy -Wean Fio2 and PEEP as tolerated -VAP/VENT bundle implementation   SEVERE COPD EXACERBATION -continue IV steroids as prescribed -continue NEB THERAPY as prescribed -morphine as needed -wean fio2 as needed and  tolerated  CARDIAC FAILURE-EF 35% -oxygen as needed -Lasix as tolerated -follow up cardiac enzymes as indicated   Renal Failure- -follow chem 7 -follow UO -continue Foley Catheter-assess need Await Nephrology consultation Will likely need CRRT   NEUROLOGY - intubated and sedated - minimal sedation to achieve a RASS goal: -1   INFECTIOUS DISEASE -continue antibiotics as prescribed -follow up cultures    GI/Nutrition GI PROPHYLAXIS as indicated DIET-->TF's as tolerated Constipation protocol as indicated   DVT/GI PRX ordered TRANSFUSIONS AS NEEDED MONITOR FSBS ASSESS the need for LABS as needed   ENDO - ICU hypoglycemic\Hyperglycemia protocol -check FSBS per protocol     Critical Care Time devoted to patient care services described in this note is 45 minutes.   Overall, patient is critically ill, prognosis is guarded.  Patient with Multiorgan failure and at high risk for cardiac arrest and death.    Prognosis is very poor, recommend DNR status  Saisha Hogue Santiago Glad, M.D.  [facility]  Medical Director [facility] Medical Director [facility] Cardio-Pulmonary Department

## 2018-05-19 NOTE — Procedures (Signed)
Arterial Catheter Insertion Procedure Note Alexis Ray [id number] 09/02/1955  Procedure: Insertion of Arterial Catheter  Indications: Blood pressure monitoring and Frequent blood sampling  Procedure Details Consent: Risks of procedure as well as the alternatives and risks of each were explained to the (patient/caregiver).  Consent for procedure obtained. Time Out: Verified patient identification, verified procedure, site/side was marked, verified correct patient position, special equipment/implants available, medications/allergies/relevent history reviewed, required imaging and test results available.  Performed  Maximum sterile technique was used including antiseptics, cap, gloves, gown, hand hygiene, mask and sheet. Skin prep: Chlorhexidine; local anesthetic administered 20 gauge catheter was inserted into left femoral artery using the Seldinger technique. ULTRASOUND GUIDANCE USED: YES Evaluation Blood flow good; BP tracing good. Complications: No apparent complications.    Harlon Ditty, AGACNP-BC [facility] Pager: [phone] Cell: [phone]    Judithe Modest 05/19/2018

## 2018-05-19 NOTE — Procedures (Signed)
Central Venous Dailysis Catheter Placement: DOUBLE LUMEN  Indication: Hemo Dialysis/CRRT   Consent:emergent   Hand washing performed prior to starting the procedure.   Procedure: An active timeout was performed and correct patient, name, & ID confirmed. Patient was positioned correctly for central venous access. Patient was prepped using strict sterile technique including chlorohexadine preps, sterile drape, sterile gown and sterile gloves.  The area was prepped, draped and anesthetized in the usual sterile manner. Patient comfort was obtained.    A Double lumen catheter was placed in LEFT IJ Vein There was good blood return, catheter caps were placed on lumens, catheter flushed easily, the line was secured and a sterile dressing and BIO-PATCH applied.   Ultrasound was used to visualize vasculature and guidance of needle.   Number of Attempts: 1 Complications:none  Estimated Blood Loss: none Operator: Butler Vegh.   Keylin Podolsky David Aaronjames Kelsay, M.D.  [facility]  Medical Director ICU-[facility] Medical Director [facility] Cardio-Pulmonary Department     

## 2018-05-20 ENCOUNTER — Inpatient Hospital Stay: Payer: Medicare HMO

## 2018-05-20 LAB — BLOOD GAS, ARTERIAL
Acid-base deficit: 2.4 mmol/L — ABNORMAL HIGH (ref 0.0–2.0)
Acid-base deficit: 3.9 mmol/L — ABNORMAL HIGH (ref 0.0–2.0)
Bicarbonate: 24.1 mmol/L (ref 20.0–28.0)
Bicarbonate: 24.6 mmol/L (ref 20.0–28.0)
FIO2: 0.7
FIO2: 0.8
MECHANICAL RATE: 16
Mechanical Rate: 16
O2 Saturation: 99.4 %
O2 Saturation: 99.7 %
PEEP: 12 cmH2O
PEEP: 12 cmH2O
PIP: 18 cmH2O
PIP: 18 cmH2O
Patient temperature: 37
Patient temperature: 37
pCO2 arterial: 50 mmHg — ABNORMAL HIGH (ref 32.0–48.0)
pCO2 arterial: 55 mmHg — ABNORMAL HIGH (ref 32.0–48.0)
pH, Arterial: 7.25 — ABNORMAL LOW (ref 7.350–7.450)
pH, Arterial: 7.3 — ABNORMAL LOW (ref 7.350–7.450)
pO2, Arterial: 168 mmHg — ABNORMAL HIGH (ref 83.0–108.0)
pO2, Arterial: 211 mmHg — ABNORMAL HIGH (ref 83.0–108.0)

## 2018-05-20 LAB — RENAL FUNCTION PANEL
ANION GAP: 10 (ref 5–15)
Albumin: 2.7 g/dL — ABNORMAL LOW (ref 3.5–5.0)
Albumin: 2.7 g/dL — ABNORMAL LOW (ref 3.5–5.0)
Albumin: 2.4 g/dL — ABNORMAL LOW (ref 3.5–5.0)
Albumin: 2.3 g/dL — ABNORMAL LOW (ref 3.5–5.0)
Anion gap: 8 (ref 5–15)
Anion gap: 8 (ref 5–15)
Anion gap: 8 (ref 5–15)
BUN: 53 mg/dL — ABNORMAL HIGH (ref 8–23)
BUN: 49 mg/dL — ABNORMAL HIGH (ref 8–23)
BUN: 44 mg/dL — ABNORMAL HIGH (ref 8–23)
BUN: 32 mg/dL — ABNORMAL HIGH (ref 8–23)
CO2: 23 mmol/L (ref 22–32)
CO2: 22 mmol/L (ref 22–32)
CO2: 23 mmol/L (ref 22–32)
CO2: 19 mmol/L — ABNORMAL LOW (ref 22–32)
CREATININE: 1.74 mg/dL — AB (ref 0.44–1.00)
Calcium: 7.9 mg/dL — ABNORMAL LOW (ref 8.9–10.3)
Calcium: 8.1 mg/dL — ABNORMAL LOW (ref 8.9–10.3)
Calcium: 8 mg/dL — ABNORMAL LOW (ref 8.9–10.3)
Calcium: 7 mg/dL — ABNORMAL LOW (ref 8.9–10.3)
Chloride: 108 mmol/L (ref 98–111)
Chloride: 107 mmol/L (ref 98–111)
Chloride: 107 mmol/L (ref 98–111)
Chloride: 108 mmol/L (ref 98–111)
Creatinine, Ser: 2.62 mg/dL — ABNORMAL HIGH (ref 0.44–1.00)
Creatinine, Ser: 2.5 mg/dL — ABNORMAL HIGH (ref 0.44–1.00)
Creatinine, Ser: 2.4 mg/dL — ABNORMAL HIGH (ref 0.44–1.00)
GFR calc Af Amer: 22 mL/min — ABNORMAL LOW (ref >60)
GFR calc Af Amer: 23 mL/min — ABNORMAL LOW (ref >60)
GFR calc Af Amer: 24 mL/min — ABNORMAL LOW (ref >60)
GFR calc Af Amer: 36 mL/min — ABNORMAL LOW (ref >60)
GFR calc non Af Amer: 19 mL/min — ABNORMAL LOW (ref >60)
GFR calc non Af Amer: 20 mL/min — ABNORMAL LOW (ref >60)
GFR calc non Af Amer: 21 mL/min — ABNORMAL LOW (ref >60)
GFR calc non Af Amer: 31 mL/min — ABNORMAL LOW (ref >60)
GLUCOSE: 145 mg/dL — AB (ref 70–99)
Glucose, Bld: 121 mg/dL — ABNORMAL HIGH (ref 70–99)
Glucose, Bld: 91 mg/dL (ref 70–99)
Glucose, Bld: 258 mg/dL — ABNORMAL HIGH (ref 70–99)
POTASSIUM: 4.7 mmol/L (ref 3.5–5.1)
Phosphorus: 5.3 mg/dL — ABNORMAL HIGH (ref 2.5–4.6)
Phosphorus: 5 mg/dL — ABNORMAL HIGH (ref 2.5–4.6)
Phosphorus: 4.5 mg/dL (ref 2.5–4.6)
Phosphorus: 4.6 mg/dL (ref 2.5–4.6)
Potassium: 4.8 mmol/L (ref 3.5–5.1)
Potassium: 4.5 mmol/L (ref 3.5–5.1)
Potassium: 4.8 mmol/L (ref 3.5–5.1)
SODIUM: 138 mmol/L (ref 135–145)
Sodium: 139 mmol/L (ref 135–145)
Sodium: 139 mmol/L (ref 135–145)
Sodium: 135 mmol/L (ref 135–145)

## 2018-05-20 LAB — CULTURE, BAL-QUANTITATIVE W GRAM STAIN

## 2018-05-20 LAB — GLUCOSE, CAPILLARY
Glucose-Capillary: 105 mg/dL — ABNORMAL HIGH (ref 70–99)
Glucose-Capillary: 122 mg/dL — ABNORMAL HIGH (ref 70–99)
Glucose-Capillary: 138 mg/dL — ABNORMAL HIGH (ref 70–99)
Glucose-Capillary: 180 mg/dL — ABNORMAL HIGH (ref 70–99)
Glucose-Capillary: 21 mg/dL — CL (ref 70–99)

## 2018-05-20 LAB — CBC
HCT: 35.8 % — ABNORMAL LOW (ref 36.0–46.0)
Hemoglobin: 10.4 g/dL — ABNORMAL LOW (ref 12.0–15.0)
MCH: 26.4 pg (ref 26.0–34.0)
MCHC: 29.1 g/dL — AB (ref 30.0–36.0)
MCV: 90.9 fL (ref 80.0–100.0)
Platelets: 131 10*3/uL — ABNORMAL LOW (ref 150–400)
RBC: 3.94 MIL/uL (ref 3.87–5.11)
RDW: 17.7 % — ABNORMAL HIGH (ref 11.5–15.5)
WBC: 6.8 10*3/uL (ref 4.0–10.5)
nRBC: 1 % — ABNORMAL HIGH (ref 0.0–0.2)

## 2018-05-20 LAB — MAGNESIUM
Magnesium: 2 mg/dL (ref 1.7–2.4)
Magnesium: 2 mg/dL (ref 1.7–2.4)
Magnesium: 1.8 mg/dL (ref 1.7–2.4)
Magnesium: 1.7 mg/dL (ref 1.7–2.4)

## 2018-05-20 LAB — CULTURE, BAL-QUANTITATIVE

## 2018-05-20 LAB — PHOSPHORUS: Phosphorus: 4.5 mg/dL (ref 2.5–4.6)

## 2018-05-20 MED ORDER — FENTANYL BOLUS VIA INFUSION
100.0000 ug | Freq: Once | INTRAVENOUS | Status: AC
  Administered 2018-05-20: 100 ug via INTRAVENOUS
  Filled 2018-05-20: qty 100

## 2018-05-20 MED ORDER — INSULIN GLARGINE 100 UNIT/ML ~~LOC~~ SOLN
25.0000 [IU] | Freq: Every day | SUBCUTANEOUS | Status: DC
  Filled 2018-05-20: qty 0.25

## 2018-05-20 MED ORDER — DEXTROSE 50 % IV SOLN: 2.0000 | Freq: Once | INTRAVENOUS | Status: DC

## 2018-05-20 MED ORDER — HYDROCORTISONE NA SUCCINATE PF 100 MG IJ SOLR: 50.0000 mg | Freq: Four times a day (QID) | INTRAMUSCULAR | Status: DC

## 2018-05-20 MED ORDER — SODIUM CHLORIDE 0.9 % IV SOLN
25.0000 ug/min | INTRAVENOUS | Status: DC
  Administered 2018-05-20: 200 ug/min via INTRAVENOUS
  Filled 2018-05-20: qty 4
  Filled 2018-05-20: qty 40

## 2018-05-20 MED ORDER — LORAZEPAM 2 MG/ML IJ SOLN
2.0000 mg | Freq: Once | INTRAMUSCULAR | Status: AC
  Administered 2018-05-20: 2 mg via INTRAVENOUS

## 2018-05-20 MED ORDER — INSULIN ASPART 100 UNIT/ML ~~LOC~~ SOLN: 0.0000 [IU] | SUBCUTANEOUS | Status: DC

## 2018-05-20 MED ORDER — NOREPINEPHRINE 16 MG/250ML-% IV SOLN
0.0000 ug/min | INTRAVENOUS | Status: DC
  Administered 2018-05-20: 40 ug/min via INTRAVENOUS
  Filled 2018-05-20 (×2): qty 250

## 2018-05-20 MED ORDER — DEXTROSE 50 % IV SOLN
INTRAVENOUS | Status: AC
  Filled 2018-05-20: qty 100

## 2018-05-20 MED ORDER — GLYCOPYRROLATE 0.2 MG/ML IJ SOLN: 0.2000 mg | INTRAMUSCULAR | Status: DC | PRN

## 2018-05-20 MED ORDER — HALOPERIDOL LACTATE 5 MG/ML IJ SOLN: 0.5000 mg | INTRAMUSCULAR | Status: DC | PRN

## 2018-05-20 MED ORDER — ONDANSETRON HCL 4 MG/2ML IJ SOLN: 4.0000 mg | Freq: Four times a day (QID) | INTRAMUSCULAR | Status: DC | PRN

## 2018-05-20 MED ORDER — BIOTENE DRY MOUTH MT LIQD: 15.0000 mL | OROMUCOSAL | Status: DC | PRN

## 2018-05-20 MED ORDER — MORPHINE SULFATE (PF) 2 MG/ML IV SOLN
INTRAVENOUS | Status: AC
  Administered 2018-05-20: 2 mg via INTRAVENOUS
  Filled 2018-05-20: qty 1

## 2018-05-20 MED ORDER — DEXTROSE 10 % IV SOLN: INTRAVENOUS | Status: DC

## 2018-05-20 MED ORDER — INSULIN ASPART 100 UNIT/ML ~~LOC~~ SOLN: 2.0000 [IU] | SUBCUTANEOUS | Status: DC

## 2018-05-20 MED ORDER — FENTANYL BOLUS VIA INFUSION
25.0000 ug | INTRAVENOUS | Status: DC | PRN
  Filled 2018-05-20: qty 50

## 2018-05-20 MED ORDER — MORPHINE SULFATE (PF) 2 MG/ML IV SOLN
2.0000 mg | Freq: Once | INTRAVENOUS | Status: AC
  Administered 2018-05-20: 2 mg via INTRAVENOUS

## 2018-05-20 MED ORDER — POLYVINYL ALCOHOL 1.4 % OP SOLN: 1.0000 [drp] | Freq: Four times a day (QID) | OPHTHALMIC | Status: DC | PRN

## 2018-05-21 LAB — CULTURE, BLOOD (ROUTINE X 2)
Culture: NO GROWTH
Culture: NO GROWTH
Special Requests: ADEQUATE
Special Requests: ADEQUATE

## 2018-05-25 LAB — GLUCOSE, CAPILLARY: Glucose-Capillary: <10 mg/dL — CL (ref 70–99)

## 2018-05-27 ENCOUNTER — Ambulatory Visit: Payer: Medicare HMO | Admitting: Family

## 2018-05-28 ENCOUNTER — Telehealth: Payer: Self-pay | Admitting: *Deleted

## 2018-05-28 NOTE — Telephone Encounter (Signed)
Received death certificate via mail today. Placed on [employer] for DS/DG completion.

## 2018-05-31 NOTE — Telephone Encounter (Signed)
Spoke to answering service for funeral home. Death certificate completed. Wanted to make sure they did not need a copy before mailing to health department.

## 2018-06-01 NOTE — Telephone Encounter (Signed)
Spoke to [place] at funeral home. Death certificate complete, faxed a copy to her. Original put in addressed envelope to [facility] Department.

## 2018-06-05 NOTE — Progress Notes (Signed)
Blood sugars checked peripherally- due to vasopressors incorrect reading. Line draw showed 180. Orders discontinued.

## 2018-06-05 NOTE — Consult Note (Signed)
Pharmacy Antibiotic Note  Alexis Ray is a 63 y.o. female admitted on 06/01/2018 with  pneumonia. Patient was started on Ceftriaxone and Azithromycin. Bronchial lavage cultures showing Pseudomonas and H. Influenzae.  Pharmacy has been consulted for Cefepime dosing.  Patient started on CRRT on 1/15   Plan: Continue Cefepime 2g IV every 12 hours (CRRT Dosing)   Height: 5\' 8"  (172.7 cm) Weight: 201 lb 1 oz (91.2 kg) IBW/kg (Calculated) : 63.9  Temp (24hrs), Avg:97.7 F (36.5 C), Min:95.9 F (35.5 C), Max:98.3 F (36.8 C)  Recent Labs  Lab 05/17/2018 1111 05/19/2018 1127 05/29/2018 1425 05/30/2018 2003 05/19/2018 2154 05/17/18 0424  05/19/18 0400  05/19/18 1732 05/19/18 2137 06-03-2018 0124 06-03-2018 0412 06/03/18 0859  WBC 9.7  --   --   --   --  5.6  --  5.6  --   --   --   --  6.8  --   CREATININE 1.78*  --   --   --   --  1.51*   < > 4.37*   < > 3.16* 2.56* 2.62* 2.50* 2.40*  LATICACIDVEN  --  4.52* 2.3* 1.3 1.2  --   --   --   --   --   --   --   --   --    < > = values in this interval not displayed.    Estimated Creatinine Clearance: 29.1 mL/min (A) (by C-G formula based on SCr of 2.4 mg/dL (H)).    Allergies  Allergen Reactions  . Colchicine     itching  . Sulfa Antibiotics Rash    itching  . Levaquin [Levofloxacin In D5w] Rash    Antimicrobials this admission: 1/14 ceftriaxone  >> 1/14 1/14 Azithromycin >> 1/14 cefepime >>   Microbiology results: 1/12 BCx: NGTD 1/13 Respirtory Cx: PSEUDOMONAS AERUGINOSA and HAEMOPHILUS INFLUENZAE 1/12  MRSA PCR: negative   Thank you for allowing pharmacy to be a part of this patient's care.  Gardner Candle, PharmD, BCPS Clinical Pharmacist 06-03-18 11:24 AM

## 2018-06-05 NOTE — Progress Notes (Addendum)
CRITICAL CARE NOTE  CC  Severe respiratory failure  SUBJECTIVE fio2 at 70% PEEP 12  Critically ill multiorgan failure Mottled feet On vasopressors On CRRT     SIGNIFICANT EVENTS COPD on 4 L [place] at home EF 15%   Failed biPAP 1/12 -intubated on MV support 1/13 BRONCH-pneumonia 1/14 vent support 1/15 worsening renal failure, resp failure, CRRT started 1/16-on  Vent, vasopressors, CRRT   BP 124/82   Pulse 92   Temp 97.9 F (36.6 C)   Resp 17   Ht 5\' 8"  (1.727 m)   Wt 91.2 kg   SpO2 100%   BMI 30.57 kg/m   REVIEW OF SYSTEMS  PATIENT IS UNABLE TO PROVIDE COMPLETE REVIEW OF SYSTEM S DUE TO SEVERE CRITICAL ILLNESS AND ENCEPHALOPATHY  PHYSICAL EXAMINATION:  GENERAL:critically ill appearing, +resp distress HEAD: Normocephalic, atraumatic.  EYES: Pupils equal, round, reactive to light.  No scleral icterus.  MOUTH: Moist mucosal membrane. NECK: Supple. No thyromegaly. No nodules. No JVD.  PULMONARY: +rhonchi, +wheezing CARDIOVASCULAR: S1 and S2. Regular rate and rhythm. No murmurs, rubs, or gallops.  GASTROINTESTINAL: Soft, nontender, -distended. No masses. Positive bowel sounds. No hepatosplenomegaly.  MUSCULOSKELETAL: No swelling, clubbing, or edema.  NEUROLOGIC: obtunded SKIN:intact,warm,dry             Indwelling Urinary Catheter continued, requirement due to   Reason to continue Indwelling Urinary Catheter for strict Intake/Output monitoring for hemodynamic instability         Ventilator continued, requirement due to, resp failure    Ventilator Sedation RASS 0 to -2     ASSESSMENT AND PLAN SYNOPSIS  63 yo AAF with multiorgan failure +COPD exacerbation +sCHF exacerbation EF 15% with b/l Pneumonia and severe septic shock with metabolic acidosis and renal failure   Severe Hypoxic and Hypercapnic Respiratory Failure -continue Mechanical Ventilator support -continue Bronchodilator Therapy -Wean Fio2 and PEEP as tolerated -VAP/VENT bundle  implementation -unable to wean vent due to high fio2 settings   SEVERE COPD EXACERBATION -continue IV steroids as prescribed -continue NEB THERAPY as prescribed -morphine as needed -wean fio2 as needed and tolerated  Septic shock -use vasopressors to keep MAP>65 -follow ABG and LA -follow up cultures -emperic ABX -stress dose steroids    CARDIAC FAILURE-EF 15% -oxygen as needed   Renal Failure- -follow chem 7 -follow UO -continue Foley Catheter-assess need On CRRT   NEUROLOGY - intubated and sedated - minimal sedation to achieve a RASS goal: -1  INFECTIOUS DISEASE -continue antibiotics as prescribed -follow up cultures 40,000 COLONIES/mL PSEUDOMONAS AERUGINOSA  MODERATE HAEMOPHILUS INFLUENZAE  BETA LACTAMASE NEGATIVE     GI/Nutrition GI PROPHYLAXIS as indicated DIET-->GIB hold TF's for now Constipation protocol as indicated      DVT/GI PRX ordered TRANSFUSIONS AS NEEDED MONITOR FSBS ASSESS the need for LABS as needed   ENDO - ICU hypoglycemic\Hyperglycemia protocol -check FSBS per protocol    Critical Care Time devoted to patient care services described in this note is 36 minutes.   Overall, patient is critically ill, prognosis is guarded.  Patient with Multiorgan failure and at high risk for cardiac arrest and death.    Lucie Leather, M.D.  [facility]  Medical Director [facility] Medical Director [facility] Cardio-Pulmonary Department

## 2018-06-05 NOTE — Progress Notes (Signed)
Patient extubated to room air per MD order at 13:53.

## 2018-06-05 NOTE — Progress Notes (Signed)
Palliative:  Family gathered outside Alexis Ray's room. She has had significant decline this morning and family are tearful. Discussed with PCCM who will take lead on transition to comfort care once all family members arrive per their conversation this morning. Reassured daughters, Alexis Ray and Alexis Ray, that they know they have done everything to provide their mother with every chance of improvement but her body is too weak and sick. Emotional support provided. Chaplain with family for spiritual support.   No charge  Yong Channel, NP Palliative Medicine Team Pager # [phone] (M-F 8a-5p) Team Phone # [phone] (Nights/Weekends)

## 2018-06-05 NOTE — Progress Notes (Signed)
Called in by staff to visit the family of pt that passed while the chaplain was rounding in the ICU unit. Pt family were greatly distraught and grieving. Chaplain showed a compassionate presence and comfort for the family members. Chaplain spoke briefly with the daughter, sister and extended family members. Chaplain offered prayer for those present and in heavy grief. Pt family were encouraged to take the time to be with their loved one for as much time as needed. Chaplain encouraged the sister to have a nurse pg the chaplain if additional support was needed. Chaplain gave space for the family to be alone with the pt.     2018-06-14 1405  Clinical Encounter Type  Visited With Patient and family together  Visit Type Death;Trauma;Spiritual support  Referral From Nurse;Care management  Consult/Referral To Chaplain  Spiritual Encounters  Spiritual Needs Prayer;Grief support;Emotional  Stress Factors  Patient Stress Factors Not reviewed  Family Stress Factors Exhausted;Loss;Major life changes

## 2018-06-05 NOTE — Progress Notes (Signed)
Pt transitioning to comfort measures only per pts family request.  Sonda Rumble, AGNP  Pulmonary/Critical Care Pager [phone] (please enter 7 digits) PCCM Consult Pager [phone] (please enter 7 digits)

## 2018-06-05 NOTE — Death Summary Note (Signed)
DEATH SUMMARY   Patient Details  Name: Alexis Ray MRN: [id number] DOB: 18-Feb-1956  Admission/Discharge Information   Admit Date:  25-May-2018  Date of Death:  2018-05-29  Time of Death:  38  Length of Stay: 4  Referring Physician: Jodean Lima, MD   Reason(s) for Hospitalization  Shortness of Breath   Diagnoses  Preliminary cause of death: Pneumonia Secondary Diagnoses (including complications and co-morbidities):  Active Problems:   Acute respiratory failure (HCC)   Lobar pneumonia, unspecified organism [facility])   Renal failure   Goals of care, counseling/discussion   Palliative care encounter   Acute on chronic renal failure with hyperkalemia    Septic shock secondary to pneumonia   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  In the ER she was initially placed on Bipap, however due to decline in respiratory status she required mechanical intubation.  She was subsequently admitted to ICU for additional workup and treatment.  On 05/17/2018 due to increasing O2 requirements via ventilator she underwent a bronchoscopy for additional workup.  Bronchoscopy revealed multiple mucus plugs requiriing extraction and extensive amounts of purulent secretions from all segments.  On 05/19/2018 she developed worsening acute on chronic renal failure with hyperkalemia requiring initiation of CRRT per  nephrology recommendations.  Despite aggressive treatment on 05/29/2018 she developed worsening hypotension likely secondary to septic and cardiogenic shock requiring maximum doses of levophed, vasopressin, and neo-synephrine gtts.  Family notified regarding rapid decline in pts condition.  Her daughters arrived at bedside and changed pts code status to DO NOT RESUSCITATE on May 29, 2018.  Once additional family members arrived at pts bedside she was transitioned to comfort measures only on 05-29-2018.  She expired on 05-29-18 at 1410 with family present at bedside.    Pertinent Labs and Studies  Significant Diagnostic Studies Dg Abd 1 View  Result Date: 05/19/2018 CLINICAL DATA:  Abdominal distension EXAM: ABDOMEN - 1 VIEW COMPARISON:  None. FINDINGS: Nasogastric tube tip and side port project over the stomach. There are bilateral iliac stents. Large amount of stool in the proximal colon. IMPRESSION: Large amount of stool in the proximal colon. Electronically Signed   By: Ulyses Jarred M.D.   On: 05/19/2018 01:55   [place] Lower Ext Art Right Ltd  Result Date: 05/12/2018 CLINICAL DATA:  63 year old female with a history of heart catheterization via right common femoral artery 04/08/2018 and pain EXAM: RIGHT LOWER EXTREMITY ARTERIAL DUPLEX EVAL TECHNIQUE: Ultrasound examination was performed including evaluation of the lower extremity vasculature with directed duplex. COMPARISON:  None. FINDINGS: ABI not recorded. Directed duplex of the right lower extremities performed. Patent common femoral vein with respiratory phasicity maintained and compressibility. Directed duplex of the right common femoral artery demonstrates atherosclerotic changes with monophasic waveform of the common femoral artery, proximal SFA, and profunda femoris. No evidence of pseudoaneurysm or hematoma. IMPRESSION: Directed duplex of the right lower extremity negative for hematoma or pseudoaneurysm. Monophasic waveform of  the common femoral  artery, proximal SFA, and profunda femoris indicating iliac occlusive disease. Signed, Dulcy Fanny. Dellia Nims, RPVI [facility] Electronically Signed   By: Corrie Mckusick D.O.   On: 05/12/2018 14:09   Dg Chest Port 1 View  Result Date: 18-Jun-2018 CLINICAL DATA:  Acute respiratory failure EXAM: PORTABLE CHEST 1 VIEW COMPARISON:  Yesterday FINDINGS: Cardiomegaly and main pulmonary artery enlargement. Low volume chest with streaky opacity at the bases. Bilateral central lines with tips at the SVC. Endotracheal tube with tip between the clavicular heads and carina. The orogastric tube continues to reach the stomach. IMPRESSION: 1. History of pneumonia with unchanged streaky opacities at the bases. 2. Stable and unremarkable hardware positioning. Electronically Signed   By: Monte Fantasia M.D.   On: 18-Jun-2018 06:21   Dg Chest Port 1 View  Result Date: 05/19/2018 CLINICAL DATA:  Central line placement. EXAM: PORTABLE CHEST 1 VIEW COMPARISON:  05/18/2018 FINDINGS: A left internal jugular dual-lumen central venous catheter has been placed. Tip lies between the mid and lower superior vena cava. There is also a new right PICC, tip also projecting between the mid and lower superior vena cava. No pneumothorax. Endotracheal tube and nasal/orogastric tube are stable and well positioned. Chronic lung changes are stable. There is persistent basilar opacity consistent with atelectasis. IMPRESSION: Left internal jugular central venous catheter tip projects in the mid to lower superior vena cava. Right PICC tip also projects in the mid to lower superior vena cava. No pneumothorax. No other change from the previous day's study. Electronically Signed   By: Lajean Manes M.D.   On: 05/19/2018 12:44   Dg Chest Port 1 View  Result Date: 05/18/2018 CLINICAL DATA:  Acute respiratory failure EXAM: PORTABLE CHEST 1 VIEW COMPARISON:  Yesterday FINDINGS: Endotracheal tube tip  is below the clavicular heads. The carina is difficult to visualize but estimate that the tip terminates 18 mm above the carina. The orogastric tube at least reaches the stomach. Cardiomegaly and vascular pedicle widening. Low volume chest with diffuse interstitial opacity and mild streaky density at the bases. IMPRESSION: 1. Stable hardware positioning. The endotracheal tube tip is 18 mm above the carina. 2. Cardiomegaly and vascular congestion. Streaky density at the bases that could be atelectasis or infection. Electronically Signed   By: Monte Fantasia M.D.   On: 05/18/2018 07:05   Dg Chest Port 1 View  Result Date: 05/17/2018 CLINICAL DATA:  Acute respiratory failure. EXAM: PORTABLE CHEST 1 VIEW COMPARISON:  05/30/2018 and 05/01/2018 FINDINGS: Endotracheal tube tip is 2.5 cm above the carina. NG tube tip is below the diaphragm. Chronic cardiomegaly. Pulmonary vascularity is now normal. Interstitial accentuation has improved. Slight residual atelectasis and scarring at the left lung base. IMPRESSION: Overall improved pulmonary vascularity and interstitial accentuation. Electronically Signed   By: Lorriane Shire M.D.   On: 05/17/2018 08:14   Dg Chest Portable 1 View  Result Date: 06/03/2018 CLINICAL DATA:  Endotracheal tube placement. EXAM: PORTABLE CHEST 1 VIEW COMPARISON:  Radiograph of same day. FINDINGS: Stable cardiomegaly. Atherosclerosis of thoracic aorta is noted. Endotracheal tube is in grossly good position. Nasogastric tube is seen entering the stomach with tip in proximal stomach. No pneumothorax is noted. Bilateral diffuse interstitial densities are noted concerning for pulmonary edema. Small left pleural effusion may be present. Bony thorax is unremarkable. IMPRESSION: Stable cardiomegaly. Endotracheal and nasogastric tubes are in grossly good position. Stable bilateral pulmonary edema is noted with possible left pleural effusion. Aortic  Atherosclerosis (ICD10-I70.0). Electronically Signed    By: Marijo Conception, M.D.   On: 05/10/2018 12:38   Dg Chest Portable 1 View  Result Date: 06/04/2018 CLINICAL DATA:  Shortness of breath. EXAM: PORTABLE CHEST 1 VIEW COMPARISON:  Chest radiograph 05/01/2018 FINDINGS: Monitoring leads overlie the patient. Stable cardiomegaly. Diffuse bilateral interstitial pulmonary opacities. Interval increase in bilateral mid and lower lung heterogeneous opacities. Probable tiny pleural effusions. IMPRESSION: Cardiomegaly with mild interstitial edema. Increase in basilar opacities may represent superimposed atelectasis, infection or focal edema. Electronically Signed   By: Lovey Newcomer M.D.   On: 05/25/2018 11:59   Dg Chest Port 1 View  Result Date: 05/01/2018 CLINICAL DATA:  63 year old with acute respiratory failure. Current smoker with hypertension, diabetes, COPD, CHF, coronary artery disease and peripheral vascular disease. EXAM: PORTABLE CHEST 1 VIEW COMPARISON:  04/30/2018 and earlier. FINDINGS: Cardiac silhouette markedly enlarged, unchanged. Diffuse interstitial pulmonary edema, unchanged since the examination 2 days ago and also unchanged dating back to 04/07/2018. The interstitial pulmonary edema is superimposed upon chronic interstitial lung disease. Dense LEFT LOWER LOBE consolidation persists unchanged. No new pulmonary parenchymal abnormalities. IMPRESSION: 1. Stable acute CHF superimposed upon chronic interstitial lung disease. 2. Stable dense LEFT LOWER LOBE atelectasis and/or pneumonia (atelectasis is favored). 3. No new abnormalities. Electronically Signed   By: Evangeline Dakin M.D.   On: 05/01/2018 08:11   Dg Chest Port 1 View  Result Date: 04/30/2018 CLINICAL DATA:  Acute onset of respiratory failure. EXAM: PORTABLE CHEST 1 VIEW COMPARISON:  Chest radiograph performed 04/29/2018 FINDINGS: The lungs are well-aerated. Vascular congestion is noted. Bibasilar airspace opacities raise concern for pulmonary edema. Small bilateral pleural effusions are  noted. No pneumothorax is seen. The cardiomediastinal silhouette is mildly enlarged. No acute osseous abnormalities are seen. IMPRESSION: Vascular congestion and mild cardiomegaly. Bibasilar airspace opacities raise concern for pulmonary edema. Small bilateral pleural effusions noted. Electronically Signed   By: Garald Balding M.D.   On: 04/30/2018 05:27   Dg Chest Portable 1 View  Result Date: 04/29/2018 CLINICAL DATA:  Shortness of breath EXAM: PORTABLE CHEST 1 VIEW COMPARISON:  04/08/2018 FINDINGS: Cardiac shadow remains enlarged. Increased vascular congestion is noted with interstitial edema similar to that seen on the prior exam. No focal infiltrate or effusion is seen. No bony abnormality is noted. IMPRESSION: Mild CHF stable from the previous exam. Electronically Signed   By: Inez Catalina M.D.   On: 04/29/2018 22:15   [place] Ekg Site Rite  Result Date: 05/18/2018 If Site Rite image not attached, placement could not be confirmed due to current cardiac rhythm.   Microbiology Recent Results (from the past 240 hour(s))  MRSA PCR Screening     Status: None   Collection Time: 05/26/2018  2:02 PM  Result Value Ref Range Status   MRSA by PCR NEGATIVE NEGATIVE Final    Comment:        The GeneXpert MRSA Assay (FDA approved for NASAL specimens only), is one component of a comprehensive MRSA colonization surveillance program. It is not intended to diagnose MRSA infection nor to guide or monitor treatment for MRSA infections. Performed at [facility], [address] [place],  [address]   Culture, blood (routine x 2)     Status: None (Preliminary result)   Collection Time: 05/18/2018  2:25 PM  Result Value Ref Range Status   Specimen Description BLOOD LHAND  Final   Special Requests   Final    BOTTLES DRAWN AEROBIC AND ANAEROBIC Blood Culture  adequate volume   Culture   Final    NO GROWTH 4 DAYS Performed at [facility], [address] [place], [place]  [address]    Report Status PENDING  Incomplete  Culture, blood (routine x 2)     Status: None (Preliminary result)   Collection Time: 05/06/2018  2:27 PM  Result Value Ref Range Status   Specimen Description BLOOD BRH  Final   Special Requests   Final    BOTTLES DRAWN AEROBIC AND ANAEROBIC Blood Culture adequate volume   Culture   Final    NO GROWTH 4 DAYS Performed at [facility], [address] [place], [place] [address]    Report Status PENDING  Incomplete  Culture, bal-quantitative     Status: Abnormal   Collection Time: 05/17/18  2:00 PM  Result Value Ref Range Status   Specimen Description   Final    BRONCHIAL ALVEOLAR LAVAGE Performed at [facility], [address] [place], [place] [address]    Special Requests   Final    NONE Performed at [facility], [address] [place], [place] [address]    Gram Stain   Final    MODERATE WBC PRESENT, PREDOMINANTLY PMN RARE GRAM POSITIVE COCCI RARE GRAM POSITIVE RODS    Culture (A)  Final    40,000 COLONIES/mL PSEUDOMONAS AERUGINOSA 80,000 COLONIES/mL HAEMOPHILUS INFLUENZAE BETA LACTAMASE NEGATIVE Performed at [facility], [place] [address] [place], [place] [address]    Report Status Jun 17, 2018 FINAL  Final   Organism ID, Bacteria PSEUDOMONAS AERUGINOSA (A)  Final      Susceptibility   Pseudomonas aeruginosa - MIC*    CEFTAZIDIME 4 SENSITIVE Sensitive     CIPROFLOXACIN <=0.25 SENSITIVE Sensitive     GENTAMICIN <=1 SENSITIVE Sensitive     IMIPENEM 2 SENSITIVE Sensitive     PIP/TAZO <=4 SENSITIVE Sensitive     CEFEPIME 2 SENSITIVE Sensitive     * 40,000 COLONIES/mL PSEUDOMONAS AERUGINOSA    Lab Basic Metabolic Panel: Recent Labs  Lab 05/19/18 2137 05/19/18 2145 June 17, 2018 0124 06-17-2018 0412 06-17-18 0859 06/17/2018 1245  NA 140  --  139 139 138 135  K 4.4  --  4.8 4.7 4.5 4.8  CL 113*  --  108 107 107 108  CO2 21*  --  _0 19*  GLUCOSE 144*  --  145* 121* 91 258*  BUN 57*   --  53* 49* 44* 32*  CREATININE 2.56*  --  2.62* 2.50* 2.40* 1.74*  CALCIUM 6.9*  --  7.9* 8.1* 8.0* 7.0*  MG  --  1.8 2.0 2.0 1.8 1.7  PHOS 4.9*  --  5.3* 5.0* 4.5 4.5  4.6   Liver Function Tests: Recent Labs  Lab 05/10/2018 1111  05/19/18 2137 June 17, 2018 0124 06-17-2018 0412 06/17/2018 0859 2018-06-17 1245  AST 38  --   --   --   --   --   --   ALT 42  --   --   --   --   --   --   ALKPHOS 83  --   --   --   --   --   --   BILITOT 0.8  --   --   --   --   --   --   PROT 7.5  --   --   --   --   --   --   ALBUMIN 3.8   < >  2.3* 2.7* 2.7* 2.4* 2.3*   < > = values in this interval not displayed.   No results for input(s): LIPASE, AMYLASE in the last 168 hours. No results for input(s): AMMONIA in the last 168 hours. CBC: Recent Labs  Lab 05/14/2018 1111 05/17/18 0424 05/19/18 0400 08-Jun-2018 0412  WBC 9.7 5.6 5.6 6.8  NEUTROABS 7.9*  --   --   --   HGB 11.8* 10.7* 10.3* 10.4*  HCT 40.1 35.5* 35.9* 35.8*  MCV 89.3 88.1 92.5 90.9  PLT 162 107* 120* 131*   Cardiac Enzymes: Recent Labs  Lab 06/01/2018 1111 05/07/2018 1633 05/28/2018 2154 05/17/18 0424  TROPONINI 0.14* 0.14* 0.15* 0.17*   Sepsis Labs: Recent Labs  Lab 05/29/2018 1111 05/07/2018 1127 05/30/2018 1425 05/19/2018 2003 05/29/2018 2154 05/17/18 0424 05/19/18 0400 2018-06-08 0412  PROCALCITON  --   --  0.27  --   --   --   --   --   WBC 9.7  --   --   --   --  5.6 5.6 6.8  LATICACIDVEN  --  4.52* 2.3* 1.3 1.2  --   --   --     Procedures/Operations  Bronchoscopy Left Femoral Arterial Line Placement  Left Temporary Dual Lumen Dialysis Catheter Placement  Left Upper Arm PICC placement  Marda Stalker, [place] Pager [phone] (please enter 7 digits) PCCM Consult Pager [phone] (please enter 7 digits)

## 2018-06-05 NOTE — Progress Notes (Signed)
Per Dr Belia HemanKasa orders to go with A-line at this time.

## 2018-06-05 NOTE — Progress Notes (Addendum)
SUBJECTIVE: Remains intubated and sedated.   Vitals:   05/24/2018 0408 05/25/2018 0500 05/22/2018 0600 05/09/2018 0700  BP:  115/85 124/82 107/89  Pulse:  86 92 88  Resp:  16 17 17   Temp:  97.9 F (36.6 C) 97.9 F (36.6 C) 97.9 F (36.6 C)  TempSrc:      SpO2:  100% 100% 100%  Weight: 91.2 kg     Height:        Intake/Output Summary (Last 24 hours) at 05/09/2018 0842 Last data filed at 06/02/2018 0719 Gross per 24 hour  Intake 4110.32 ml  Output 340 ml  Net 3770.32 ml    LABS: Basic Metabolic Panel: Recent Labs    05/14/2018 0124 05/18/2018 0412  NA 139 139  K 4.8 4.7  CL 108 107  CO2 23 22  GLUCOSE 145* 121*  BUN 53* 49*  CREATININE 2.62* 2.50*  CALCIUM 7.9* 8.1*  MG 2.0 2.0  PHOS 5.3* 5.0*   Liver Function Tests: Recent Labs    05/12/2018 0124 05/14/2018 0412  ALBUMIN 2.7* 2.7*   No results for input(s): LIPASE, AMYLASE in the last 72 hours. CBC: Recent Labs    05/19/18 0400 05/24/2018 0412  WBC 5.6 6.8  HGB 10.3* 10.4*  HCT 35.9* 35.8*  MCV 92.5 90.9  PLT 120* 131*   Cardiac Enzymes: No results for input(s): CKTOTAL, CKMB, CKMBINDEX, TROPONINI in the last 72 hours. BNP: Invalid input(s): POCBNP D-Dimer: No results for input(s): DDIMER in the last 72 hours. Hemoglobin A1C: No results for input(s): HGBA1C in the last 72 hours. Fasting Lipid Panel: Recent Labs    05/17/18 2014  TRIG 95   Thyroid Function Tests: No results for input(s): TSH, T4TOTAL, T3FREE, THYROIDAB in the last 72 hours.  Invalid input(s): FREET3 Anemia Panel: No results for input(s): VITAMINB12, FOLATE, FERRITIN, TIBC, IRON, RETICCTPCT in the last 72 hours.   PHYSICAL EXAM General: Critically ill, intubated and sedated. HEENT:  Normocephalic and atramatic Neck:   No JVD.  Lungs: Clear bilaterally to auscultation and percussion. Heart: HRRR . Normal S1 and S2 without gallops or murmurs.  Abdomen: Bowel sounds are positive, abdomen soft and non-tender  Extremities: No clubbing,  cyanosis or edema.   Neuro:  intubated and sedated. Psych:   intubated and sedated  TELEMETRY: NSR 94bpm, frequent PVCs, runs of Vtach  ASSESSMENT AND PLAN: Acute respiratory failure secondary to COPD and CHF exacerbation: Last echo 05/13/18 showed 4 chamber dilatation and EF 25-30%. Blood pressure remains low, on pressors, holding Entresto/coreg/spironolactone/lasix.  Worsening renal failure, now on CRRT. K and Mag are WNL. Frequent PVCs noted.  Continue respiratory support and renal support and will consider restarting HF medications when renal and respiratory status are improved.     Active Problems:   Acute respiratory failure (HCC)   Lobar pneumonia, unspecified organism [facility])   Renal failure   Goals of care, counseling/discussion   Palliative care encounter    Caroleen Hamman, NP-C 06/04/2018 8:42 AM Cell: [phone]

## 2018-06-05 NOTE — Progress Notes (Signed)
Annabelle Harman and Dr Belia Heman at patients bedside. Blood pressure dropping, agonol breathing. Orders to start levophed. Awaiting for pharmacy dispensing. Daughter called to inform of patients status. She will notify family. Patient hooked up to code cart for monitoring. Vent changed to 100% fio2. Unable to get an accurate SpO2 reading MD aware, and maxed out on pressors. No additional orders at this time.

## 2018-06-05 NOTE — Progress Notes (Signed)
Patient made comfort care. Dana PA at bedside. Removed CRRT machine/vent. PRN medications given. Patient went into asystole at 14:10. No respirations/no palpable pulse, no sounds heard during auscultation. Family at bedside. Kasa/Dana NP aware. Supervisor notified. [place] donor notified ref. [id number] Felipa Eth. Possible tissue donor.

## 2018-06-05 NOTE — Progress Notes (Signed)
[facility]  ROUNDING NOTE   Subjective:  Patient remains critically ill. She is still on CRRT. She is requiring multiple pressors. Still on the ventilator.  Objective:  Vital signs in last 24 hours:  Temp:  [95.9 F (35.5 C)-98.3 F (36.8 C)] 97.9 F (36.6 C) (01/16 0700) Pulse Rate:  [71-101] 88 (01/16 0700) Resp:  [12-19] 17 (01/16 0700) BP: (77-145)/(17-116) 107/89 (01/16 0700) SpO2:  [70 %-100 %] 100 % (01/16 0808) Arterial Line BP: (118-131)/(48-56) 127/49 (01/16 0700) FiO2 (%):  [60 %-100 %] 60 % (01/16 0808) Weight:  [91.2 kg] 91.2 kg (01/16 0408)  Weight change: 1.1 kg Filed Weights   05/18/18 0414 05/19/18 0319 23-May-2018 0408  Weight: 88.6 kg 90.1 kg 91.2 kg    Intake/Output: I/O last 3 completed shifts: In: 6670.8 [I.V.:4346; NG/GT:1267.3; IV Piggyback:1057.4] Out: 585 [Urine:425; Emesis/NG output:160]   Intake/Output this shift:  Total I/O In: -  Out: 5 [Urine:5]  Physical Exam: General: Critically ill appearing  Head: ETT in place  Eyes: Anicteric  Neck: Supple, trachea midline  Lungs:  Scattered rhonchi, vent assisted  Heart: S1S2 no rubs  Abdomen:  Soft, nontender, bowel sounds present  Extremities: 1+ peripheral edema.  Neurologic: Intubated, not following commands  Skin: No lesions       Basic Metabolic Panel: Recent Labs  Lab 05/19/18 1511 05/19/18 1732 05/19/18 2137 05/19/18 2145 05/23/18 0124 2018-05-23 0412  NA 141 143 140  --  139 139  K 5.1 4.9 4.4  --  4.8 4.7  CL 112* 115* 113*  --  108 107  CO2 22 21* 21*  --  23 22  GLUCOSE 161* 162* 144*  --  145* 121*  BUN 79* 72* 57*  --  53* 49*  CREATININE 3.74* 3.16* 2.56*  --  2.62* 2.50*  CALCIUM 7.2* 7.1* 6.9*  --  7.9* 8.1*  MG 2.2 2.1  --  1.8 2.0 2.0  PHOS 7.0* 5.7* 4.9*  --  5.3* 5.0*    Liver Function Tests: Recent Labs  Lab 05/25/2018 1111 05/19/18 1511 05/19/18 1732 05/19/18 2137 23-May-2018 0124 05-23-2018 0412  AST 38  --   --   --   --   --   ALT 42   --   --   --   --   --   ALKPHOS 83  --   --   --   --   --   BILITOT 0.8  --   --   --   --   --   PROT 7.5  --   --   --   --   --   ALBUMIN 3.8 2.4* 2.5* 2.3* 2.7* 2.7*   No results for input(s): LIPASE, AMYLASE in the last 168 hours. No results for input(s): AMMONIA in the last 168 hours.  CBC: Recent Labs  Lab 05/28/2018 1111 05/17/18 0424 05/19/18 0400 23-May-2018 0412  WBC 9.7 5.6 5.6 6.8  NEUTROABS 7.9*  --   --   --   HGB 11.8* 10.7* 10.3* 10.4*  HCT 40.1 35.5* 35.9* 35.8*  MCV 89.3 88.1 92.5 90.9  PLT 162 107* 120* 131*    Cardiac Enzymes: Recent Labs  Lab 05/12/2018 1111 05/30/2018 1633 05/09/2018 2154 05/17/18 0424  TROPONINI 0.14* 0.14* 0.15* 0.17*    BNP: Invalid input(s): POCBNP  CBG: Recent Labs  Lab 05/19/18 2158 05/19/18 2200 05/23/18 0049 2018/05/23 0350 May 23, 2018 0721  GLUCAP 135* 120* 138* 122* 105*    Microbiology: Results  for orders placed or performed during the hospital encounter of 05/28/2018  MRSA PCR Screening     Status: None   Collection Time: 05/29/2018  2:02 PM  Result Value Ref Range Status   MRSA by PCR NEGATIVE NEGATIVE Final    Comment:        The GeneXpert MRSA Assay (FDA approved for NASAL specimens only), is one component of a comprehensive MRSA colonization surveillance program. It is not intended to diagnose MRSA infection nor to guide or monitor treatment for MRSA infections. Performed at [facility], [address] [place], [place] [address]   Culture, blood (routine x 2)     Status: None (Preliminary result)   Collection Time: 05/10/2018  2:25 PM  Result Value Ref Range Status   Specimen Description BLOOD LHAND  Final   Special Requests   Final    BOTTLES DRAWN AEROBIC AND ANAEROBIC Blood Culture adequate volume   Culture   Final    NO GROWTH 4 DAYS Performed at [facility], [address], [place], [place] [address]    Report Status PENDING  Incomplete  Culture, blood (routine x 2)     Status:  None (Preliminary result)   Collection Time: 05/31/2018  2:27 PM  Result Value Ref Range Status   Specimen Description BLOOD BRH  Final   Special Requests   Final    BOTTLES DRAWN AEROBIC AND ANAEROBIC Blood Culture adequate volume   Culture   Final    NO GROWTH 4 DAYS Performed at [facility], [address] [place], [place] [address]    Report Status PENDING  Incomplete  Culture, bal-quantitative     Status: Abnormal   Collection Time: 05/17/18  2:00 PM  Result Value Ref Range Status   Specimen Description   Final    BRONCHIAL ALVEOLAR LAVAGE Performed at [facility], [address] [place], [place] [address]    Special Requests   Final    NONE Performed at [facility], [address] [place], [place] [address]    Gram Stain   Final    MODERATE WBC PRESENT, PREDOMINANTLY PMN RARE GRAM POSITIVE COCCI RARE GRAM POSITIVE RODS    Culture (A)  Final    40,000 COLONIES/mL PSEUDOMONAS AERUGINOSA 80,000 COLONIES/mL HAEMOPHILUS INFLUENZAE BETA LACTAMASE NEGATIVE Performed at [facility], [place] [address], [place],  [address]    Report Status 05/25/18 FINAL  Final   Organism ID, Bacteria PSEUDOMONAS AERUGINOSA (A)  Final      Susceptibility   Pseudomonas aeruginosa - MIC*    CEFTAZIDIME 4 SENSITIVE Sensitive     CIPROFLOXACIN <=0.25 SENSITIVE Sensitive     GENTAMICIN <=1 SENSITIVE Sensitive     IMIPENEM 2 SENSITIVE Sensitive     PIP/TAZO <=4 SENSITIVE Sensitive     CEFEPIME 2 SENSITIVE Sensitive     * 40,000 COLONIES/mL PSEUDOMONAS AERUGINOSA    Coagulation Studies: No results for input(s): LABPROT, INR in the last 72 hours.  Urinalysis: No results for input(s): COLORURINE, LABSPEC, PHURINE, GLUCOSEU, HGBUR, BILIRUBINUR, KETONESUR, PROTEINUR, UROBILINOGEN, NITRITE, LEUKOCYTESUR in the last 72 hours.  Invalid input(s): APPERANCEUR    Imaging: Dg Abd 1 View  Result Date: 05/19/2018 CLINICAL DATA:  Abdominal distension  EXAM: ABDOMEN - 1 VIEW COMPARISON:  None. FINDINGS: Nasogastric tube tip and side port project over the stomach. There are bilateral iliac stents. Large amount of stool in the proximal colon. IMPRESSION: Large amount of stool in the proximal colon. Electronically  Signed   By: Ulyses Jarred M.D.   On: 05/19/2018 01:55   Dg Chest Port 1 View  Result Date: 11-Jun-2018 CLINICAL DATA:  Acute respiratory failure EXAM: PORTABLE CHEST 1 VIEW COMPARISON:  Yesterday FINDINGS: Cardiomegaly and main pulmonary artery enlargement. Low volume chest with streaky opacity at the bases. Bilateral central lines with tips at the SVC. Endotracheal tube with tip between the clavicular heads and carina. The orogastric tube continues to reach the stomach. IMPRESSION: 1. History of pneumonia with unchanged streaky opacities at the bases. 2. Stable and unremarkable hardware positioning. Electronically Signed   By: Monte Fantasia M.D.   On: June 11, 2018 06:21   Dg Chest Port 1 View  Result Date: 05/19/2018 CLINICAL DATA:  Central line placement. EXAM: PORTABLE CHEST 1 VIEW COMPARISON:  05/18/2018 FINDINGS: A left internal jugular dual-lumen central venous catheter has been placed. Tip lies between the mid and lower superior vena cava. There is also a new right PICC, tip also projecting between the mid and lower superior vena cava. No pneumothorax. Endotracheal tube and nasal/orogastric tube are stable and well positioned. Chronic lung changes are stable. There is persistent basilar opacity consistent with atelectasis. IMPRESSION: Left internal jugular central venous catheter tip projects in the mid to lower superior vena cava. Right PICC tip also projects in the mid to lower superior vena cava. No pneumothorax. No other change from the previous day's study. Electronically Signed   By: Lajean Manes M.D.   On: 05/19/2018 12:44   [place] Ekg Site Rite  Result Date: 05/18/2018 If Site Rite image not attached, placement could not be confirmed  due to current cardiac rhythm.    Medications:   . sodium chloride Stopped (05/18/18 1429)  . azithromycin Stopped (05/19/18 1641)  . ceFEPime (MAXIPIME) IV Stopped (Jun 11, 2018 0233)  . fentaNYL infusion INTRAVENOUS 250 mcg/hr (05/19/18 0700)  . phenylephrine (NEO-SYNEPHRINE) Adult infusion 5 mcg/min (06/11/2018 0600)  . pureflow 2,500 mL/hr at 2018-06-11 0104  . vasopressin (PITRESSIN) infusion - *FOR SHOCK* 0.03 Units/min (06-11-18 0600)   . aspirin  81 mg Per Tube Daily  . chlorhexidine gluconate (MEDLINE KIT)  15 mL Mouth Rinse BID  . feeding supplement (PRO-STAT SUGAR FREE 64)  30 mL Per Tube BID  . feeding supplement (VITAL HIGH PROTEIN)  1,000 mL Per Tube Q24H  . heparin injection (subcutaneous)  5,000 Units Subcutaneous Q8H  . insulin aspart  0-15 Units Subcutaneous Q4H  . insulin aspart  2 Units Subcutaneous Q4H  . insulin glargine  25 Units Subcutaneous QHS  . ipratropium-albuterol  3 mL Nebulization Q4H  . mouth rinse  15 mL Mouth Rinse 10 times per day  . methylPREDNISolone (SOLU-MEDROL) injection  40 mg Intravenous Q12H  . multivitamin  15 mL Per Tube Daily  . pantoprazole sodium  40 mg Per Tube Daily  . senna-docusate  1 tablet Per Tube BID  . sodium chloride flush  10-40 mL Intracatheter Q12H  . venlafaxine  75 mg Per Tube TID   acetaminophen, albuterol, heparin, LORazepam, midazolam, midazolam, sodium chloride flush, vecuronium  Assessment/ Plan:  63 y.o. female PMHx of coronary artery disease, COPD, diabetes mellitus type 2, hypertension, chronic kidney disease stage III baseline creatinine 1.3 now admitted with multiorgan failure including acute on chronic systolic heart failure, acute respiratory failure, acute renal failure.  1.  Acute renal failure/chronic kidney disease stage III.  Patient critically ill.  She has evidence of multiorgan failure including acute on chronic heart failure, acute respiratory failure, and now  acute renal failure.   -Continue CRRT at  this time.  No ultrafiltration plan as her blood pressure is still low.  Continue to monitor serum electrolytes.  2.  Acute respiratory failure.  Continue ventilatory support.  FiO2 currently 60%.  3.  Acute on chronic systolic heart failure.  Patient being maintained on pressors for cardiogenic shock.     LOS: 4   2020-02-129:02 AM

## 2018-06-05 NOTE — Progress Notes (Signed)
   June 09, 2018 1015  Clinical Encounter Type  Visited With Patient and family together  Visit Type Initial  Recommendations ongoing follow up  Spiritual Encounters  Spiritual Needs Emotional;Prayer;Grief support   Chaplain received page to support family.  Daughter indicated that they were still waiting on other family to arrive; however, they asked for chaplain to pray with them for the decisions that they would need to make regarding patient's care.  Chaplain and family gathered at bedside and prayed together.  Chaplain stepped out to allow them time together and daughter indicated that she would page when additional family arrived.  Chaplain encountered patient's friend on the unit who appeared visibly upset.  Chaplain engaged friend and she shared how this experience was touching on memories of her mother's death last 10-10-22.  Chaplain offered emotional and bereavement support, and utilized active and reflective listening as friend engaged in review of her relationship with patient and the meaning that they hold for one another.

## 2018-06-05 NOTE — Progress Notes (Signed)
Pt has declined rapidly requiring maximal doses of levophed, vasopressin, neo-synephrine gtts to maintain map >65. She is also having agonal respirations on the ventilator.  I have spoken to pts daughters Dreonte and Kara Pacer who are at bedside regarding the decline in their mothers condition and the high likelihood she will cardiac arrest. They have decided to change pts code status to DO NOT RESUSCITATE.  Once additional family members arrive at bedside they would like their mother transitioned to comfort measures only.  Will continue to monitor and assess pt.  Sonda Rumble, AGNP  Pulmonary/Critical Care Pager [phone] (please enter 7 digits) PCCM Consult Pager [phone] (please enter 7 digits)

## 2018-06-05 NOTE — Progress Notes (Signed)
Family at bedside. 

## 2018-06-05 NOTE — Progress Notes (Signed)
[facility] [place] [facility]   PATIENT NAME: Alexis GaribaldiDebra Ray    MR#:  [id number]  DATE OF BIRTH:  [birth year]  SUBJECTIVE:  CHIEF COMPLAINT:   Chief Complaint  Patient presents with  . Shortness of Breath   No new complaint this morning.  Patient remains intubated and sedated.  Patient was hypothermic and had to be placed on warming blanket this morning.  Nursing staff reported cold right foot with decreased pulses.  Arrangements being made for ultrasound to confirm pulse  REVIEW OF SYSTEMS:  ROS  Unobtainable due to patient being sedated on vent  DRUG ALLERGIES:   Allergies  Allergen Reactions  . Colchicine     itching  . Sulfa Antibiotics Rash    itching  . Levaquin [Levofloxacin In D5w] Rash   VITALS:  Blood pressure (!) 76/60, pulse 71, temperature 99.1 F (37.3 C), resp. rate (!) 34, height 5\' 8"  (1.727 m), weight 91.2 kg, SpO2 (!) 83 %. PHYSICAL EXAMINATION:  GENERAL:  63 y.o.-year-old patient lying in the bed, intubated, critically ill appearing EYES: Pupils equal, round, reactive to light and accommodation. No scleral icterus.  HEENT: Head atraumatic, normocephalic. Oropharynx and nasopharynx clear.  NECK:  Supple, no jugular venous distention. No thyroid enlargement LUNGS: Scant breath sounds bilaterally, bibasilar scattered wheezes heard.    Patient on the vent CARDIOVASCULAR: S1, S2 normal. No murmurs, rubs, or gallops.  ABDOMEN: Soft, obese, nondistended. Bowel sounds present. No organomegaly or mass.  EXTREMITIES: No pedal edema, cyanosis, or clubbing.  NEUROLOGIC: is sedated.,  Unable to do a complete neuro exam PSYCHIATRIC: The patient is sedated.  SKIN: No obvious rash,   LABORATORY PANEL:  Female CBC Recent Labs  Lab 05/29/2018 0412  WBC 6.8  HGB 10.4*  HCT 35.8*  PLT 131*   ------------------------------------------------------------------------------------------------------------------ Chemistries  Recent Labs  Lab  January 11, 2019 1111  05/22/2018 1245  NA 137   < > 135  K 5.1   < > 4.8  CL 102   < > 108  CO2 23   < > 19*  GLUCOSE 288*   < > 258*  BUN 27*   < > 32*  CREATININE 1.78*   < > 1.74*  CALCIUM 8.5*   < > 7.0*  MG  --    < > 1.7  AST 38  --   --   ALT 42  --   --   ALKPHOS 83  --   --   BILITOT 0.8  --   --    < > = values in this interval not displayed.   RADIOLOGY:  Dg Chest Port 1 View  Result Date: 05/19/2018 CLINICAL DATA:  Acute respiratory failure EXAM: PORTABLE CHEST 1 VIEW COMPARISON:  Yesterday FINDINGS: Cardiomegaly and main pulmonary artery enlargement. Low volume chest with streaky opacity at the bases. Bilateral central lines with tips at the SVC. Endotracheal tube with tip between the clavicular heads and carina. The orogastric tube continues to reach the stomach. IMPRESSION: 1. History of pneumonia with unchanged streaky opacities at the bases. 2. Stable and unremarkable hardware positioning. Electronically Signed   By: Marnee SpringJonathon  Watts M.D.   On: 05/15/2018 06:21   ASSESSMENT AND PLAN:    Alexis GaribaldiDebra Ray  is a 63 y.o. female with a known history of chronic respiratory failure secondary to COPD with ongoing smoking on 4 L home oxygen, chronic systolic heart failure with last known ejection fraction less than 15%, diabetes mellitus, hypertension and GERD presented  to hospital secondary to worsening shortness of breath.  1.  Acute hypoxic respiratory failure-secondary to COPD exacerbation and CHF exacerbation -Intubated, currently on ventilator.  Gradually weaning down FiO2 as tolerated.  Being managed by critical care physician.-Started on IV steroids, inhalers/nebulizers. Patient status post bronchoscopy by critical care physician on 05/17/2018 which showed extensive mucous and purulent secretions. Continue antibiotics.  2.  Acute on chronic CHF exacerbation-systolic dysfunction with EF less than 15% Patient seen by cardiologist.  Patient said to have been compliant with her  medications and was wearing LifeVest on follow-up.  Plan is for once patient gets extubated and blood pressure stable, to resume medications. Per cardiology documentation recent ejection fraction on recent 2D echocardiogram done about 5 days prior to this admission in cardiologist office was 25 to 30%.   Started on IV Lasix and monitor BNP -Continue cardiac medications including aspirin, statin, Coreg, aldactone and Entresto Seen by palliative care team.  3. . Acute kidney injury superimposed on CKD stage III- Nephrology service following.  Due to worsening of renal function, patient was started on CRRT by nephrology service on 05/19/2018  4.  Diabetes mellitus-continue home medications.  Monitor with sliding scale insulin especially while on steroids.  5.  GERD-Protonix    DVT prophylaxis; heparin   All the records are reviewed and case discussed with Care Management/Social Worker. Management plans discussed with the patient, family and they are in agreement.  CODE STATUS: DNR  TOTAL TIME TAKING CARE OF THIS PATIENT: 34 minutes.   More than 50% of the time was spent in counseling/coordination of care: YES  POSSIBLE D/C IN 3 DAYS, DEPENDING ON CLINICAL CONDITION.   Alexis Ray M.D on 05/17/2018 at 1:52 PM  Between 7am to 6pm - Pager - [phone]  After 6pm go to [email or web address] - Social research officer, government  [facility]  Office  [phone]  CC: Primary care physician; Keane Police, MD  Note: This dictation was prepared with Dragon dictation along with smaller phrase technology. Any transcriptional errors that result from this process are unintentional.

## 2018-06-05 NOTE — Consult Note (Signed)
MEDICATION RELATED CONSULT NOTE - INITIAL   Pharmacy Consult for Review Meds DAILY for adjustment due to CRRT  Medications:  Scheduled:  . aspirin  81 mg Per Tube Daily  . chlorhexidine gluconate (MEDLINE KIT)  15 mL Mouth Rinse BID  . feeding supplement (PRO-STAT SUGAR FREE 64)  30 mL Per Tube BID  . feeding supplement (VITAL HIGH PROTEIN)  1,000 mL Per Tube Q24H  . heparin injection (subcutaneous)  5,000 Units Subcutaneous Q8H  . hydrocortisone sod succinate (SOLU-CORTEF) inj  50 mg Intravenous Q6H  . insulin aspart  0-15 Units Subcutaneous Q4H  . insulin aspart  2 Units Subcutaneous Q4H  . insulin glargine  25 Units Subcutaneous QHS  . ipratropium-albuterol  3 mL Nebulization Q4H  . mouth rinse  15 mL Mouth Rinse 10 times per day  . multivitamin  15 mL Per Tube Daily  . pantoprazole sodium  40 mg Per Tube Daily  . senna-docusate  1 tablet Per Tube BID  . sodium chloride flush  10-40 mL Intracatheter Q12H  . venlafaxine  75 mg Per Tube TID   Infusions:  . sodium chloride Stopped (05/18/18 1429)  . azithromycin 250 mL/hr at 05/15/2018 0935  . ceFEPime (MAXIPIME) IV Stopped (05/11/2018 0233)  . fentaNYL infusion INTRAVENOUS 250 mcg/hr (05/19/18 0700)  . norepinephrine (LEVOPHED) Adult infusion 40 mcg/min (05/08/2018 1021)  . phenylephrine (NEO-SYNEPHRINE) Adult infusion 200 mcg/min (05/28/2018 1103)  . pureflow 2,500 mL/hr at 05/15/2018 0104  . vasopressin (PITRESSIN) infusion - *FOR SHOCK* 0.03 Units/min (06/01/2018 0935)   PRN: acetaminophen, albuterol, heparin, LORazepam, midazolam, midazolam, sodium chloride flush, vecuronium  Assessment: Pharmacy consulted for daily review of medications for adjustment due to CRRT initiation. CRRT started 1/15.   Plan:  1/16 All medications have been reviewed. No dose adjustments needed at this time.    M , PharmD, BCPS Clinical Pharmacist 05/08/2018 11:24 AM    

## 2018-06-05 NOTE — Progress Notes (Signed)
Alexis Ray, Dr Belia Heman ordered to go with Cuff pressure verses A-line. They are aware of patients frequent PVC's and v-tach. [place] PA, cardiology aware. At this time no additional orders. Sent down mag and bmp. Alexis Ray in to assess patient. Cannot doppler right pedal pulse. She will place orders for ultrasound.

## 2018-06-05 DEATH — deceased

## 2020-05-29 IMAGING — CR DG CHEST 2V
1 series · 2 of 2 positions shown · non-contrast
Comparison: 01/17/2018

CLINICAL DATA: Chest pain

EXAM:
CHEST - 2 VIEW

[Series 1: dg chest 2 view · 0.14mm/px · 2 of 2 slices shown]
[im 1/2]
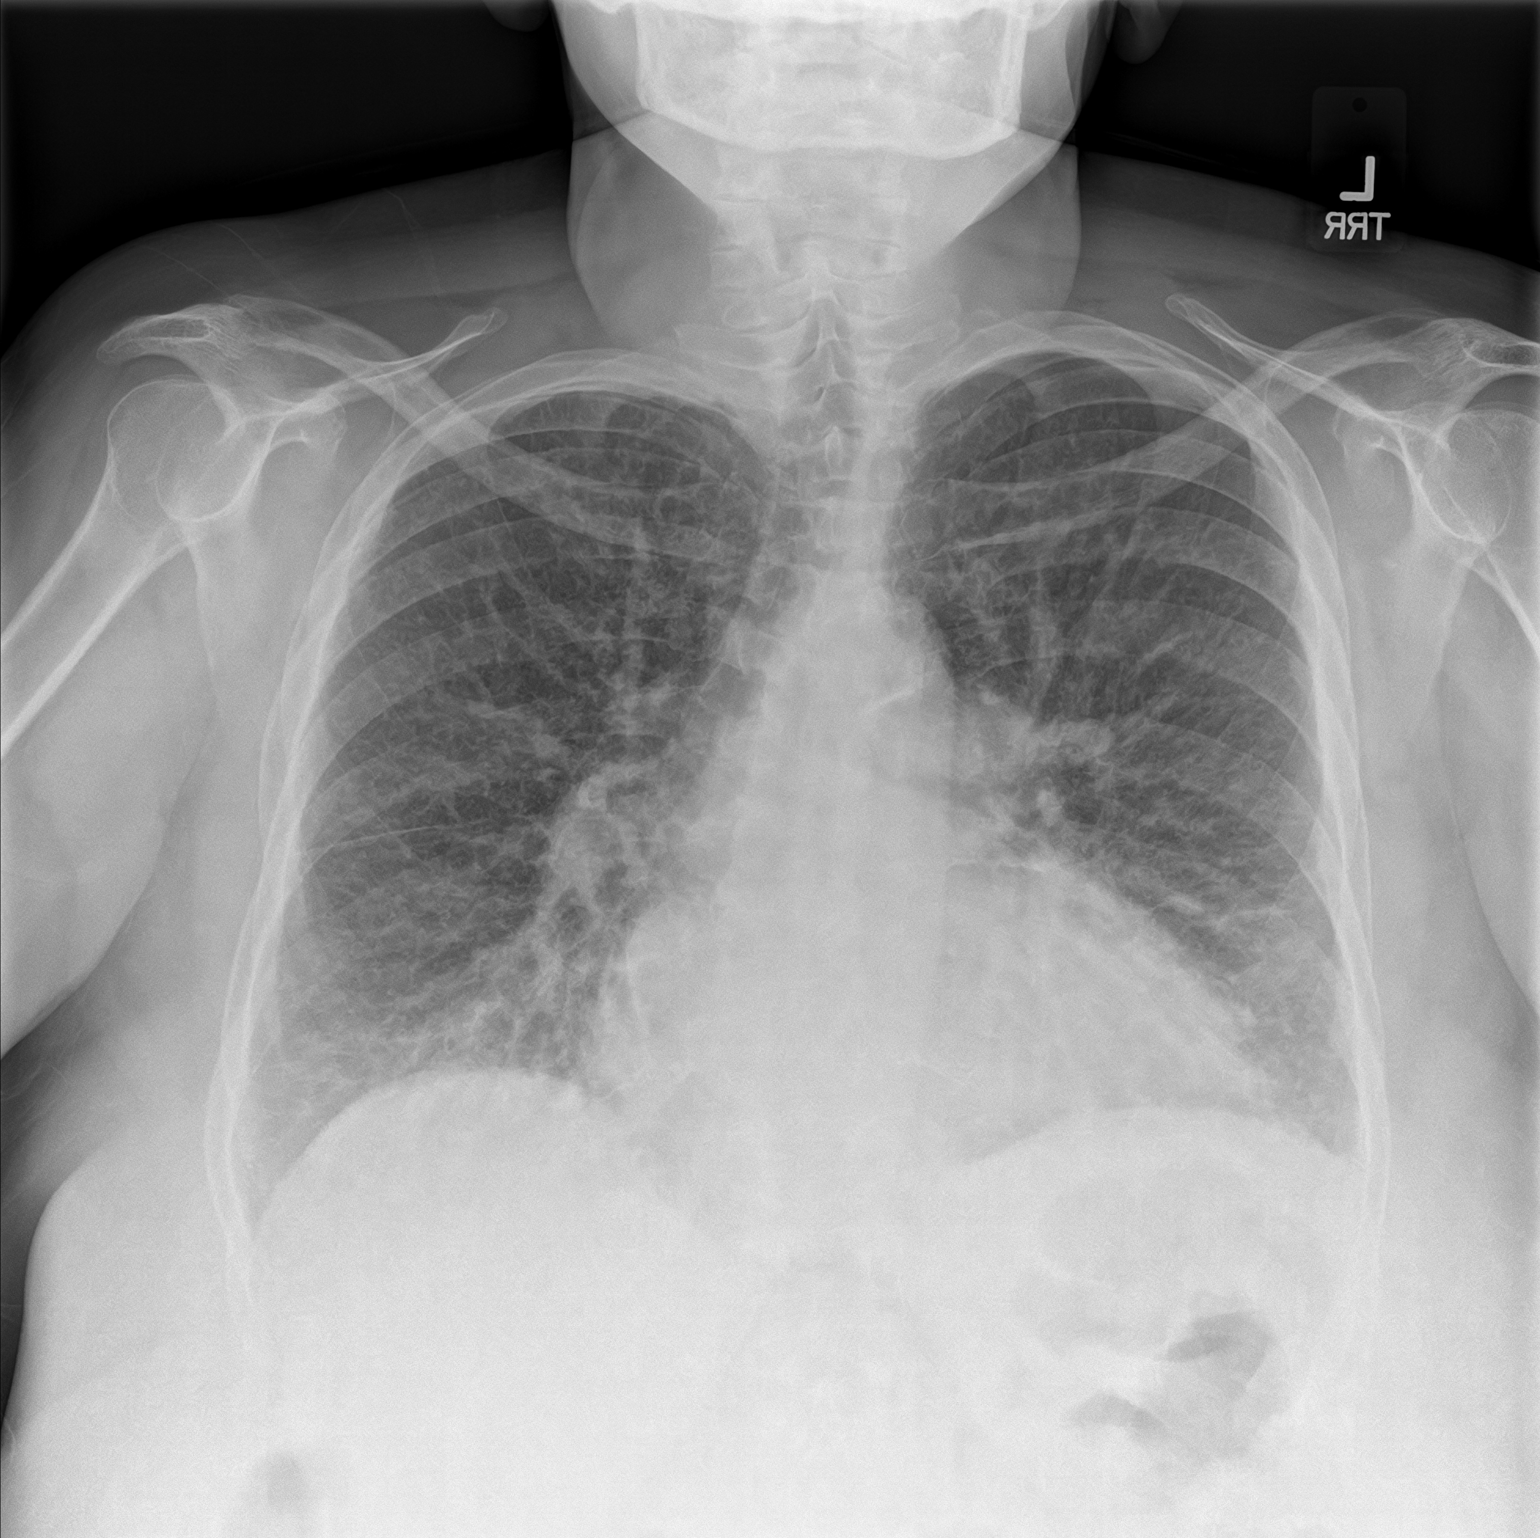
[im 2/2]
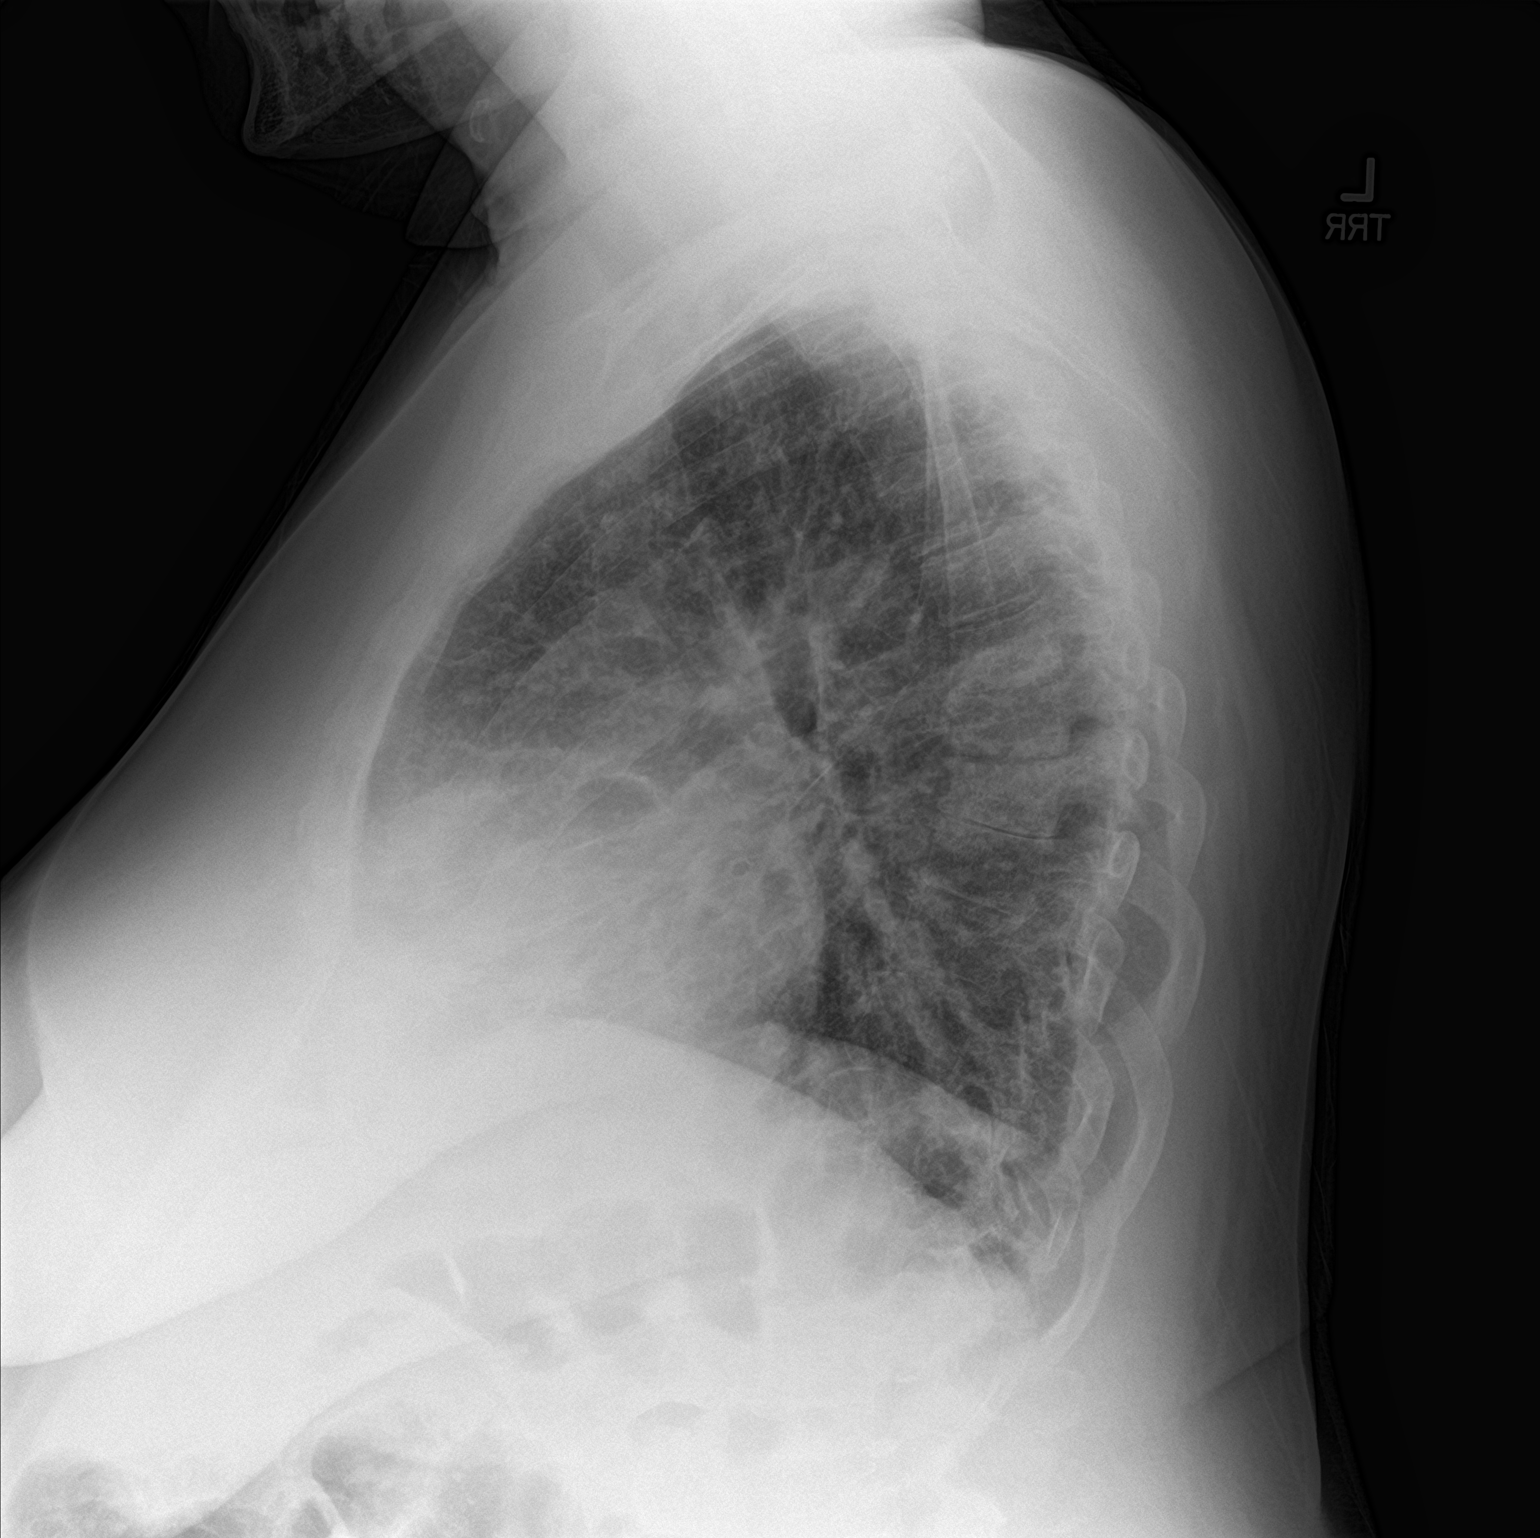

[2 of 2 positions shown; findings below may reference images not displayed]

FINDINGS: Cardiomegaly with vascular congestion and interstitial prominence
which could reflect early or mild interstitial edema. No effusions.
No acute bony abnormality.
IMPRESSION: Cardiomegaly. Worsening interstitial prominence concerning for
interstitial edema.

## 2020-05-31 IMAGING — DX DG CHEST 1V PORT
1 series · 1 of 1 positions shown · non-contrast
Comparison: Prior radiograph from 04/05/2018

CLINICAL DATA: Initial evaluation for acute cough.

EXAM:
PORTABLE CHEST 1 VIEW

[chest ap]
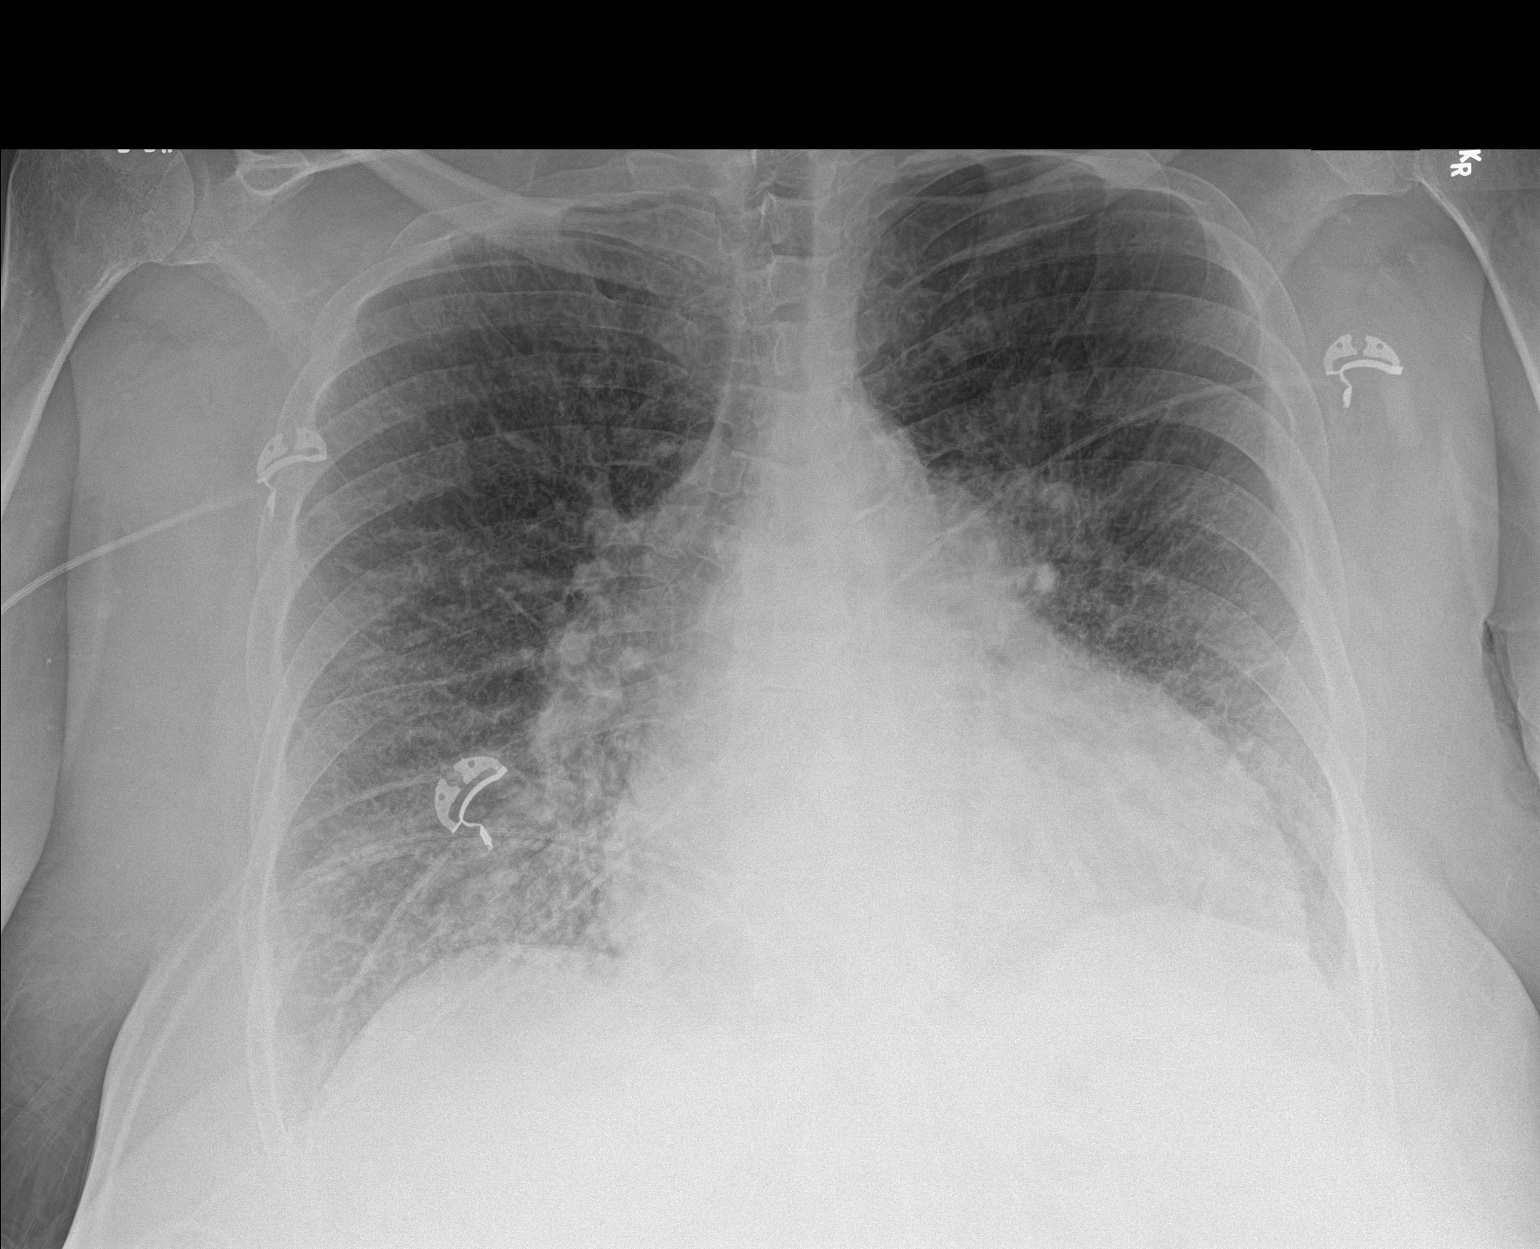

[1 of 1 positions shown; findings below may reference images not displayed]

FINDINGS: Cardiomegaly, stable. Mediastinal silhouette within normal limits.
Aortic atherosclerosis.

Lungs hypoinflated. Continued interval worsening in pulmonary
vascular congestion with interstitial prominence, compatible with
worsened pulmonary interstitial edema. No pleural effusion. No new
consolidative opacity. No pneumothorax.

No acute osseous abnormality.
IMPRESSION: 1. Continued interval worsening in pulmonary vascular congestion
with interstitial prominence, compatible with worsened pulmonary
interstitial edema.
2. Stable cardiomegaly.
3. Aortic atherosclerosis.

## 2020-06-01 IMAGING — DX DG CHEST 1V PORT
1 series · 1 of 1 positions shown · non-contrast
Comparison: 04/07/2018

CLINICAL DATA: Shortness of breath

EXAM:
PORTABLE CHEST 1 VIEW

[chest ap]
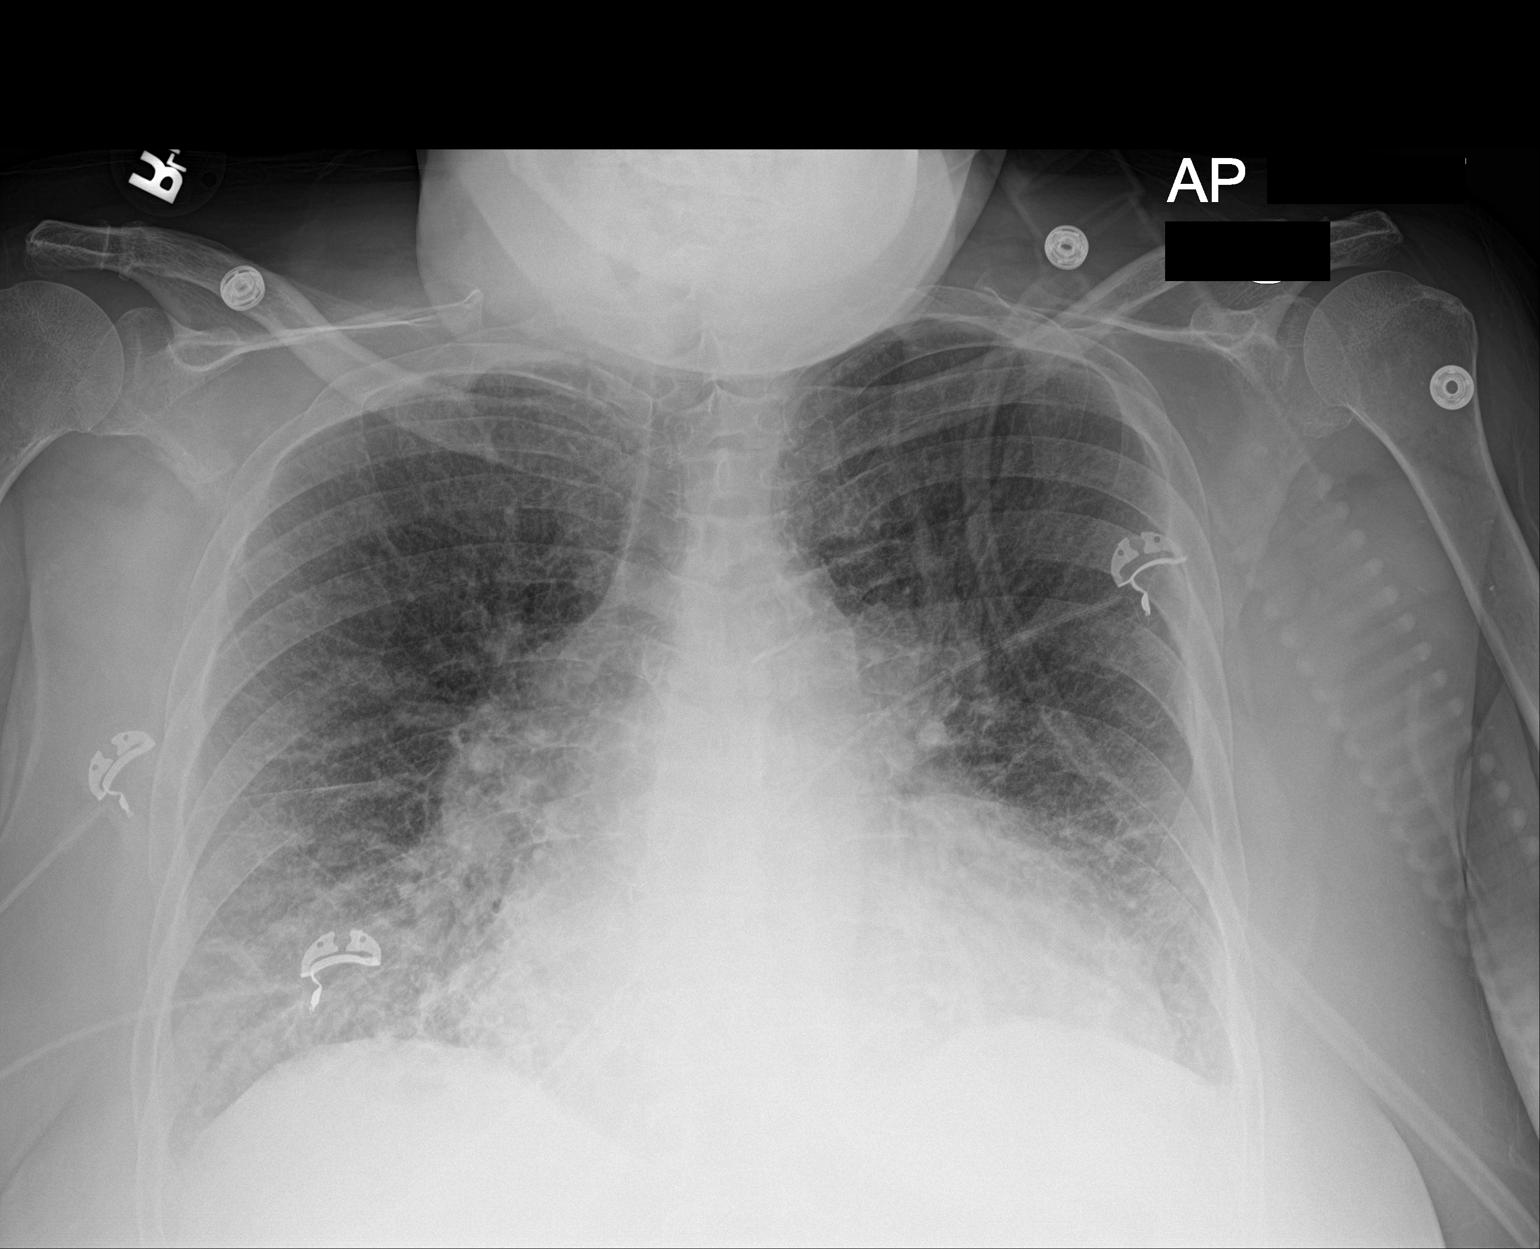

[1 of 1 positions shown; findings below may reference images not displayed]

FINDINGS: Cardiomegaly with vascular congestion and interstitial prominence,
likely interstitial edema. Bibasilar atelectasis. No visible
significant effusions or acute bony abnormality.
IMPRESSION: Cardiomegaly.  Mild interstitial edema.  Bibasilar atelectasis.

## 2020-06-22 IMAGING — DX DG CHEST 1V PORT
2 series · 2 of 2 positions shown · non-contrast
Comparison: 04/08/2018

CLINICAL DATA: Shortness of breath

EXAM:
PORTABLE CHEST 1 VIEW

[chest ap (1 of 2)]
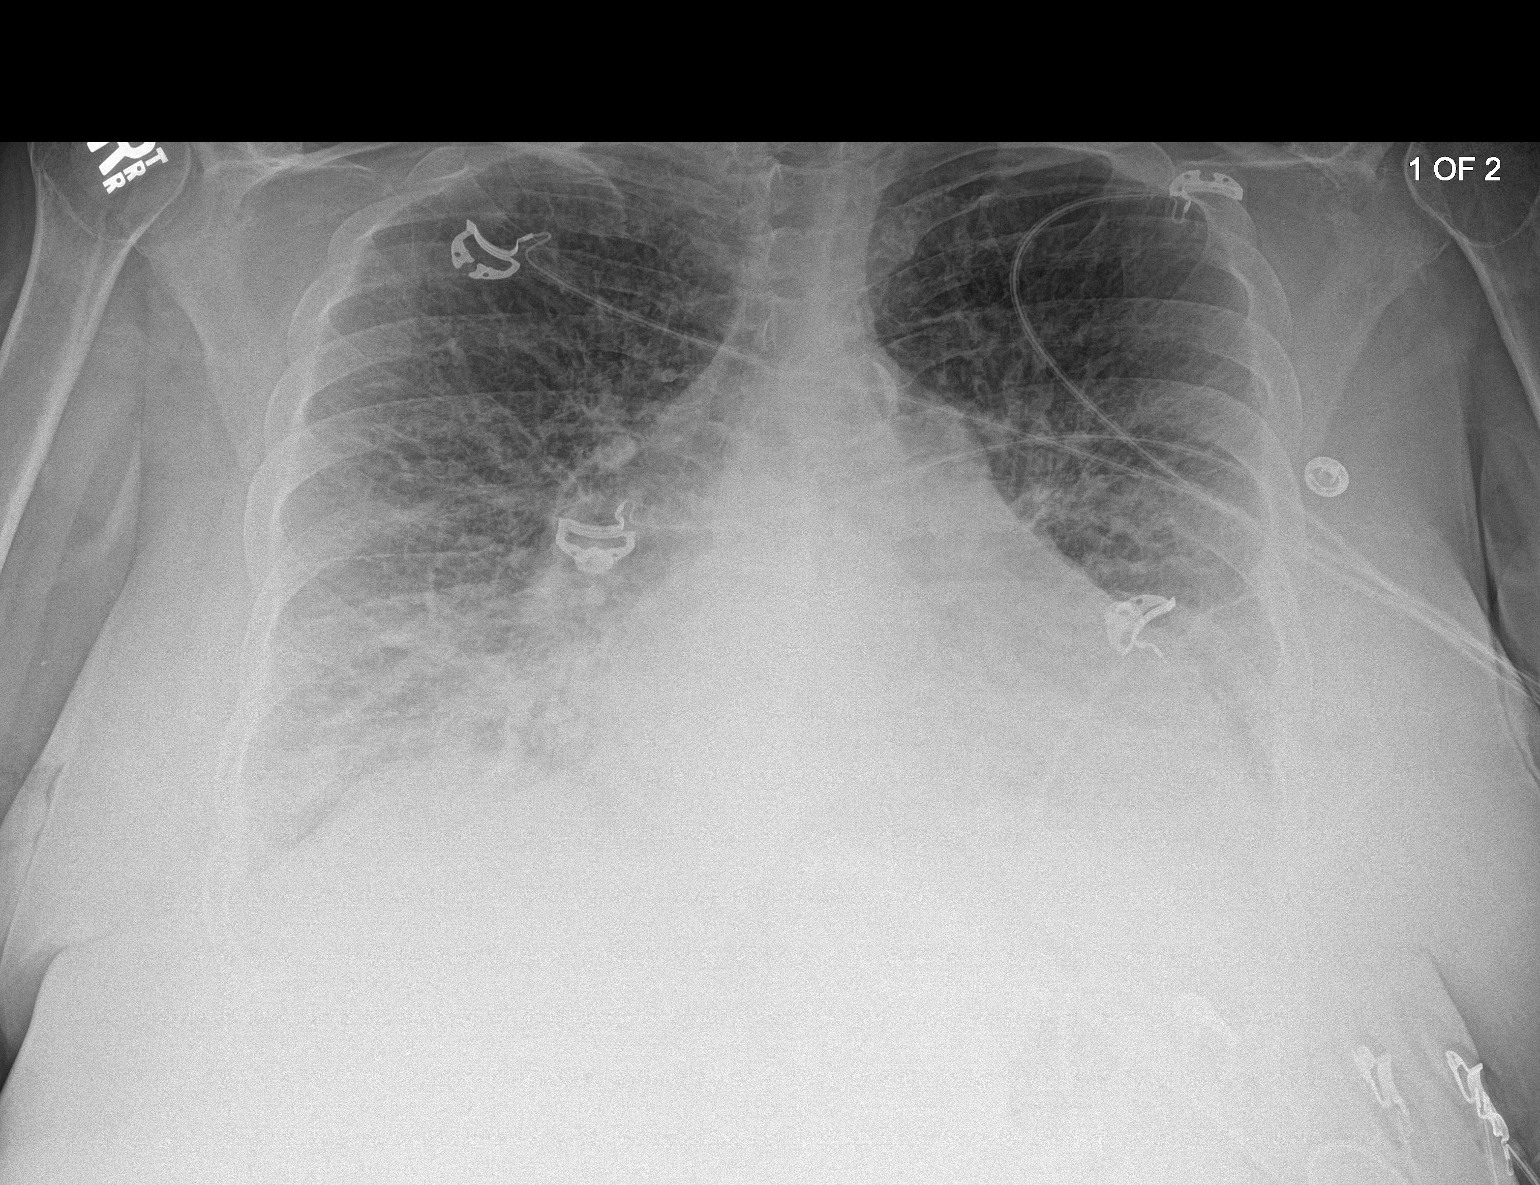

[chest ap (2 of 2)]
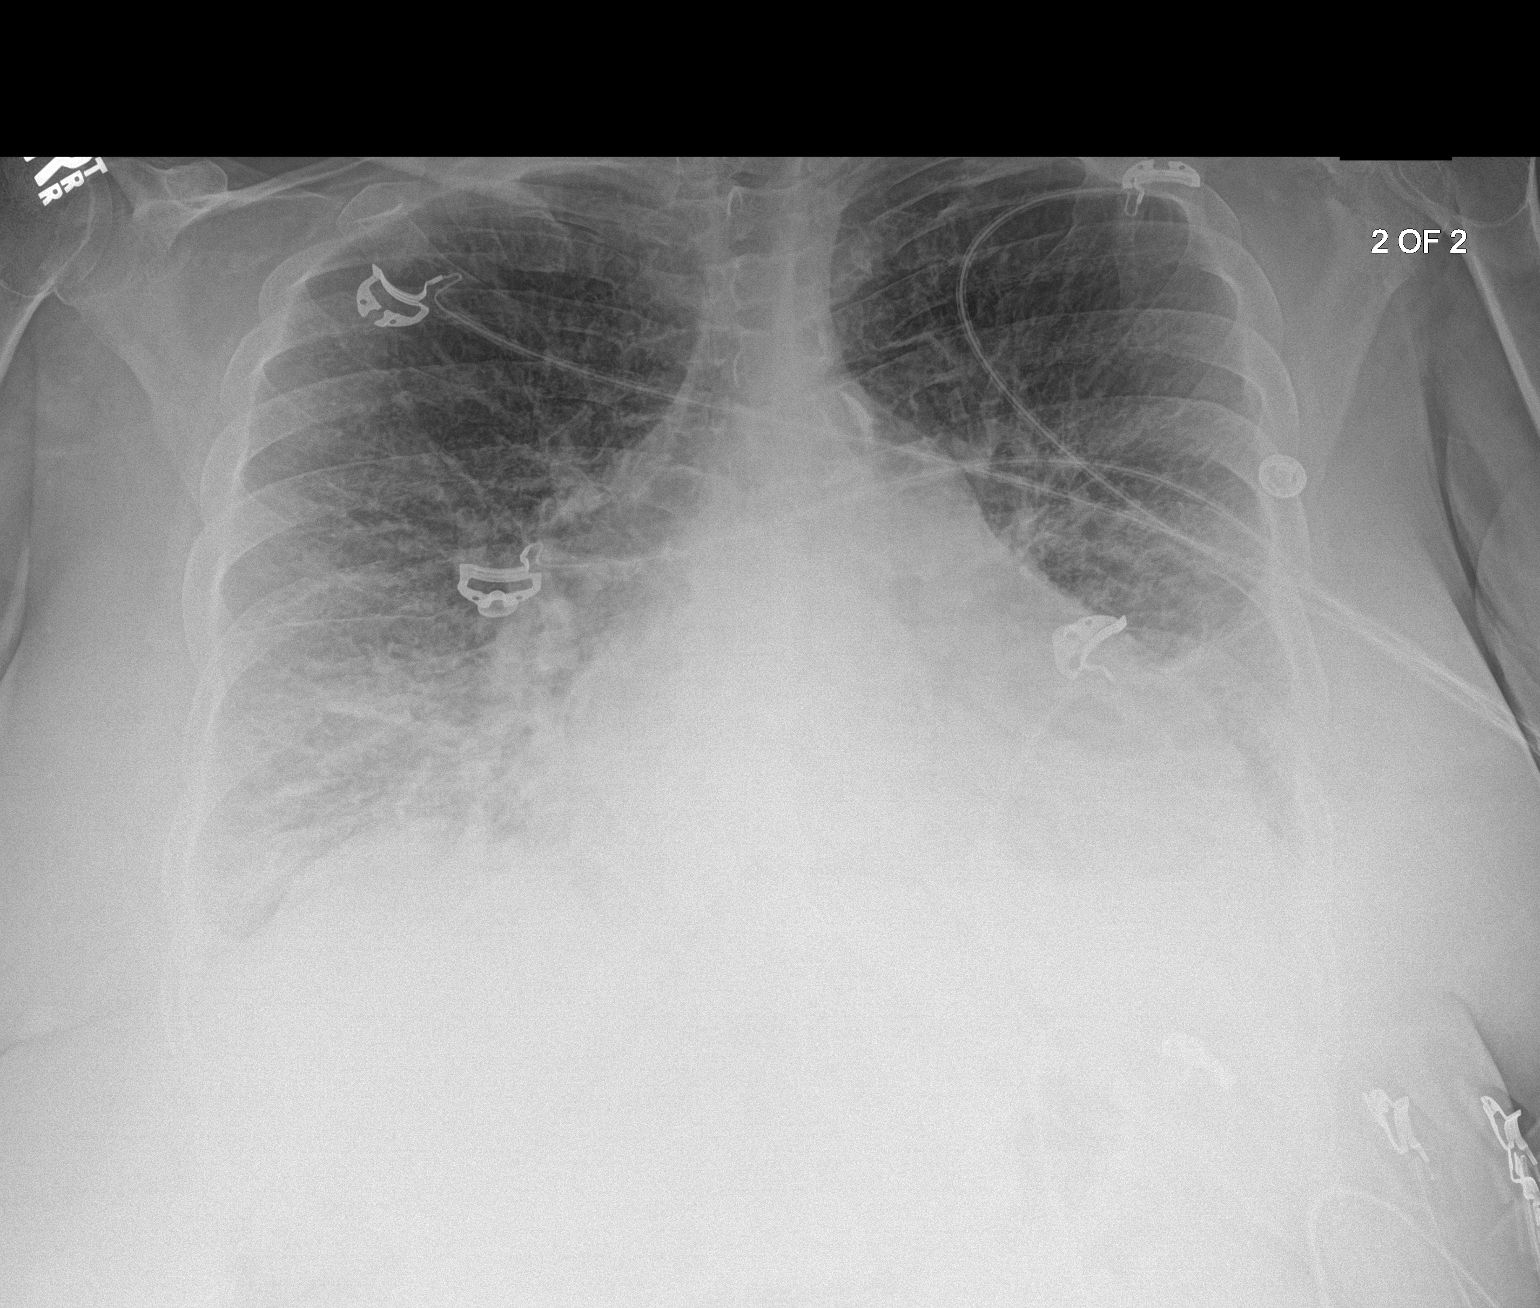

[2 of 2 positions shown; findings below may reference images not displayed]

FINDINGS: Cardiac shadow remains enlarged. Increased vascular congestion is
noted with interstitial edema similar to that seen on the prior
exam. No focal infiltrate or effusion is seen. No bony abnormality
is noted.
IMPRESSION: Mild CHF stable from the previous exam.
